# Patient Record
Sex: Female | Born: 1960 | Race: Black or African American | Hispanic: No | Smoking: Former smoker
Health system: Southern US, Community
[De-identification: ages and names within clinical notes are randomized; demographics above are authoritative.]

## PROBLEM LIST (undated history)

## (undated) DIAGNOSIS — J45909 Unspecified asthma, uncomplicated: Secondary | ICD-10-CM

## (undated) DIAGNOSIS — I10 Essential (primary) hypertension: Secondary | ICD-10-CM

## (undated) DIAGNOSIS — F32A Depression, unspecified: Secondary | ICD-10-CM

## (undated) DIAGNOSIS — J189 Pneumonia, unspecified organism: Secondary | ICD-10-CM

## (undated) DIAGNOSIS — J309 Allergic rhinitis, unspecified: Secondary | ICD-10-CM

## (undated) DIAGNOSIS — F329 Major depressive disorder, single episode, unspecified: Secondary | ICD-10-CM

## (undated) DIAGNOSIS — I82409 Acute embolism and thrombosis of unspecified deep veins of unspecified lower extremity: Secondary | ICD-10-CM

## (undated) DIAGNOSIS — R011 Cardiac murmur, unspecified: Secondary | ICD-10-CM

## (undated) DIAGNOSIS — T7840XA Allergy, unspecified, initial encounter: Secondary | ICD-10-CM

## (undated) DIAGNOSIS — K219 Gastro-esophageal reflux disease without esophagitis: Secondary | ICD-10-CM

## (undated) DIAGNOSIS — M1990 Unspecified osteoarthritis, unspecified site: Secondary | ICD-10-CM

## (undated) HISTORY — DX: Acute embolism and thrombosis of unspecified deep veins of unspecified lower extremity: I82.409

## (undated) HISTORY — DX: Allergic rhinitis, unspecified: J30.9

## (undated) HISTORY — DX: Unspecified asthma, uncomplicated: J45.909

## (undated) HISTORY — PX: JOINT REPLACEMENT: SHX530

## (undated) HISTORY — PX: TONSILLECTOMY: SUR1361

## (undated) HISTORY — DX: Allergy, unspecified, initial encounter: T78.40XA

---

## 2012-10-17 ENCOUNTER — Ambulatory Visit (INDEPENDENT_AMBULATORY_CARE_PROVIDER_SITE_OTHER): Payer: BC Managed Care – PPO | Admitting: Family Medicine

## 2012-10-17 ENCOUNTER — Encounter: Payer: Self-pay | Admitting: Family Medicine

## 2012-10-17 ENCOUNTER — Ambulatory Visit: Payer: Self-pay | Admitting: Family Medicine

## 2012-10-17 VITALS — BP 168/110 | Temp 98.5°F | Wt 194.2 lb

## 2012-10-17 DIAGNOSIS — I10 Essential (primary) hypertension: Secondary | ICD-10-CM

## 2012-10-17 DIAGNOSIS — Z0000 Encounter for general adult medical examination without abnormal findings: Secondary | ICD-10-CM

## 2012-10-17 DIAGNOSIS — J019 Acute sinusitis, unspecified: Secondary | ICD-10-CM

## 2012-10-17 MED ORDER — FLUCONAZOLE 150 MG PO TABS: 150.0000 mg | ORAL_TABLET | Freq: Once | ORAL | Status: DC

## 2012-10-17 MED ORDER — LEVOFLOXACIN 500 MG PO TABS: 500.0000 mg | ORAL_TABLET | Freq: Every day | ORAL | Status: AC

## 2012-10-17 MED ORDER — LISINOPRIL 5 MG PO TABS: 5.0000 mg | ORAL_TABLET | Freq: Every day | ORAL | Status: DC

## 2012-10-17 NOTE — Patient Instructions (Addendum)
Store brand Allegra 180 mg one daily Flonase 2 sprays daily   DASH Diet The DASH diet stands for "Dietary Approaches to Stop Hypertension." It is a healthy eating plan that has been shown to reduce high blood pressure (hypertension) in as little as 14 days, while also possibly providing other significant health benefits. These other health benefits include reducing the risk of breast cancer after menopause and reducing the risk of type 2 diabetes, heart disease, colon cancer, and stroke. Health benefits also include weight loss and slowing kidney failure in patients with chronic kidney disease.  DIET GUIDELINES  Limit salt (sodium). Your diet should contain less than 1500 mg of sodium daily.  Limit refined or processed carbohydrates. Your diet should include mostly whole grains. Desserts and added sugars should be used sparingly.  Include small amounts of heart-healthy fats. These types of fats include nuts, oils, and tub margarine. Limit saturated and trans fats. These fats have been shown to be harmful in the body. CHOOSING FOODS  The following food groups are based on a 2000 calorie diet. See your Registered Dietitian for individual calorie needs. Grains and Grain Products (6 to 8 servings daily)  Eat More Often: Whole-wheat bread, brown rice, whole-grain or wheat pasta, quinoa, popcorn without added fat or salt (air popped).  Eat Less Often: White bread, white pasta, white rice, cornbread. Vegetables (4 to 5 servings daily)  Eat More Often: Fresh, frozen, and canned vegetables. Vegetables may be raw, steamed, roasted, or grilled with a minimal amount of fat.  Eat Less Often/Avoid: Creamed or fried vegetables. Vegetables in a cheese sauce. Fruit (4 to 5 servings daily)  Eat More Often: All fresh, canned (in natural juice), or frozen fruits. Dried fruits without added sugar. One hundred percent fruit juice ( cup [237 mL] daily).  Eat Less Often: Dried fruits with added sugar. Canned  fruit in light or heavy syrup. [employer], Fish, and Poultry (2 servings or less daily. One serving is 3 to 4 oz [85-114 g]).  Eat More Often: Ninety percent or leaner ground beef, tenderloin, sirloin. Round cuts of beef, chicken breast, [place] breast. All fish. Grill, bake, or broil your meat. Nothing should be fried.  Eat Less Often/Avoid: Fatty cuts of meat, [place], or chicken leg, thigh, or wing. Fried cuts of meat or fish. Dairy (2 to 3 servings)  Eat More Often: Low-fat or fat-free milk, low-fat plain or light yogurt, reduced-fat or part-skim cheese.  Eat Less Often/Avoid: Milk (whole, 2%).Whole milk yogurt. Full-fat cheeses. Nuts, Seeds, and Legumes (4 to 5 servings per week)  Eat More Often: All without added salt.  Eat Less Often/Avoid: Salted nuts and seeds, canned beans with added salt. Fats and Sweets (limited)  Eat More Often: Vegetable oils, tub margarines without trans fats, sugar-free gelatin. Mayonnaise and salad dressings.  Eat Less Often/Avoid: Coconut oils, palm oils, butter, stick margarine, cream, half and half, cookies, candy, pie. FOR MORE INFORMATION The Dash Diet Eating Plan: www.dashdiet.org Document Released: 04/19/2011 Document Revised: 07/23/2011 Document Reviewed: 04/19/2011 [facility] Patient Information 2014 [place], [place].   Hypertension Hypertension is another name for high blood pressure. High blood pressure may mean that your heart needs to work harder to pump blood. Blood pressure consists of two numbers, which includes a higher number over a lower number (example: 110/72). HOME CARE   Make lifestyle changes as told by your doctor. This may include weight loss and exercise.  Take your blood pressure medicine every day.  Limit how much salt  you use.  Stop smoking if you smoke.  Do not use drugs.  Talk to your doctor if you are using decongestants or birth control pills. These medicines might make blood pressure higher.  Females should  not drink more than 1 alcoholic drink per day. Males should not drink more than 2 alcoholic drinks per day.  See your doctor as told. GET HELP RIGHT AWAY IF:   You have a blood pressure reading with a top number of 180 or higher.  You get a very bad headache.  You get blurred or changing vision.  You feel confused.  You feel weak, numb, or faint.  You get chest or belly (abdominal) pain.  You throw up (vomit).  You cannot breathe very well. MAKE SURE YOU:   Understand these instructions.  Will watch your condition.  Will get help right away if you are not doing well or get worse. Document Released: 10/17/2007 Document Revised: 07/23/2011 Document Reviewed: 10/17/2007 [facility] Patient Information 2014 [place], [place].

## 2012-10-17 NOTE — Progress Notes (Signed)
  Subjective:    Patient ID: Stephanie [name], female    DOB: 17-Apr-1961, 52 y.o.   MRN: [id number]  URI  This is a new problem. The current episode started in the past 7 days. The problem has been gradually worsening. Associated symptoms include coughing, ear pain and a sore throat. She has tried antihistamine for the symptoms. The treatment provided no relief.   Family history high blood pressure patient smokes a few cigarettes a day I encouraged her to quit   Review of Systems  HENT: Positive for ear pain and sore throat.   Respiratory: Positive for cough.        Objective:   Physical Exam Lungs are clear hearts regular blood pressure elevated on 2 readings mild sinus tenderness       Assessment & Plan:  #1 quit smoking #2 hypertension lisinopril 5 mg daily followup within 4-6 weeks to check blood pressure warning signs side effects discussed dietary measures and exercise discussed #3 acute sinusitis antibiotics as prescribed

## 2012-10-20 ENCOUNTER — Encounter: Payer: Self-pay | Admitting: *Deleted

## 2012-11-06 ENCOUNTER — Ambulatory Visit: Payer: BC Managed Care – PPO | Admitting: Family Medicine

## 2012-12-08 ENCOUNTER — Other Ambulatory Visit: Payer: Self-pay | Admitting: *Deleted

## 2012-12-08 MED ORDER — LISINOPRIL 5 MG PO TABS: 5.0000 mg | ORAL_TABLET | Freq: Every day | ORAL | Status: DC

## 2013-03-18 ENCOUNTER — Telehealth: Payer: Self-pay | Admitting: Nurse Practitioner

## 2013-03-18 NOTE — Telephone Encounter (Signed)
Pt having some yeast infection can we call in something to [facility], she prefers the cream

## 2013-03-19 ENCOUNTER — Other Ambulatory Visit: Payer: Self-pay | Admitting: *Deleted

## 2013-03-19 ENCOUNTER — Other Ambulatory Visit: Payer: Self-pay | Admitting: Nurse Practitioner

## 2013-03-19 DIAGNOSIS — I83893 Varicose veins of bilateral lower extremities with other complications: Secondary | ICD-10-CM

## 2013-03-19 MED ORDER — TERCONAZOLE 0.4 % VA CREA: 1.0000 | TOPICAL_CREAM | Freq: Every day | VAGINAL | Status: DC

## 2013-04-02 ENCOUNTER — Encounter: Payer: Self-pay | Admitting: Vascular Surgery

## 2013-04-02 ENCOUNTER — Encounter (HOSPITAL_COMMUNITY): Payer: Self-pay

## 2013-05-06 ENCOUNTER — Encounter: Payer: Self-pay | Admitting: Nurse Practitioner

## 2013-05-06 ENCOUNTER — Ambulatory Visit (INDEPENDENT_AMBULATORY_CARE_PROVIDER_SITE_OTHER): Payer: BC Managed Care – PPO | Admitting: Nurse Practitioner

## 2013-05-06 VITALS — BP 134/98 | Temp 99.8°F | Ht 64.0 in | Wt 191.8 lb

## 2013-05-06 DIAGNOSIS — J111 Influenza due to unidentified influenza virus with other respiratory manifestations: Secondary | ICD-10-CM

## 2013-05-06 MED ORDER — OSELTAMIVIR PHOSPHATE 75 MG PO CAPS: 75.0000 mg | ORAL_CAPSULE | Freq: Two times a day (BID) | ORAL | Status: DC

## 2013-05-06 NOTE — Patient Instructions (Signed)

## 2013-05-13 ENCOUNTER — Encounter: Payer: Self-pay | Admitting: Nurse Practitioner

## 2013-05-13 NOTE — Progress Notes (Signed)
Subjective:  Presents complaints of chills fatigue body aches sore throat and cough that began yesterday. No vomiting or diarrhea. Minimal headache. Taking fluids well. Voiding normal limit.  Objective:   BP 134/98  Temp(Src) 99.8 F (37.7 C)  Ht 5\' 4"  (1.626 m)  Wt 191 lb 12.8 oz (87 kg)  BMI 32.91 kg/m2 NAD. Alert, oriented. Fatigued in appearance. TMs normal limit. Pharynx clear. Neck supple with minimal adenopathy. Lungs clear. Heart regular rate rhythm.  Assessment:Influenza  Plan: Meds ordered this encounter  Medications  . triamterene-hydrochlorothiazide (DYAZIDE) 37.5-25 MG per capsule    Sig:   . oseltamivir (TAMIFLU) 75 MG capsule    Sig: Take 1 capsule (75 mg total) by mouth 2 (two) times daily.    Dispense:  10 capsule    Refill:  0    Order Specific Question:  Supervising Provider    Answer:  Merlyn Albert [[id number]]   Influenza-the patient was diagnosed with influenza. Patient/family educated about the flu and warning signs to watch for. If difficulty breathing, severe neck pain and stiffness, cyanosis, disorientation, or progressive worsening then immediately get rechecked at that ER. If progressive symptoms be certain to be rechecked. Supportive measures such as Tylenol/ibuprofen was discussed. No aspirin use in children. And influenza home care instruction sheet was given. Tamiflu prescribed.

## 2013-09-16 ENCOUNTER — Ambulatory Visit: Payer: BC Managed Care – PPO | Admitting: Family Medicine

## 2014-09-24 ENCOUNTER — Ambulatory Visit (INDEPENDENT_AMBULATORY_CARE_PROVIDER_SITE_OTHER): Payer: BLUE CROSS/BLUE SHIELD | Admitting: Nurse Practitioner

## 2014-09-24 ENCOUNTER — Encounter: Payer: Self-pay | Admitting: Nurse Practitioner

## 2014-09-24 VITALS — BP 134/88 | Ht 64.0 in | Wt 200.6 lb

## 2014-09-24 DIAGNOSIS — M76891 Other specified enthesopathies of right lower limb, excluding foot: Secondary | ICD-10-CM

## 2014-09-24 DIAGNOSIS — M5441 Lumbago with sciatica, right side: Secondary | ICD-10-CM | POA: Diagnosis not present

## 2014-09-24 DIAGNOSIS — M65851 Other synovitis and tenosynovitis, right thigh: Secondary | ICD-10-CM

## 2014-09-24 MED ORDER — PHENTERMINE HCL 37.5 MG PO TABS: 37.5000 mg | ORAL_TABLET | Freq: Every day | ORAL | Status: DC

## 2014-09-24 MED ORDER — CHLORZOXAZONE 500 MG PO TABS: 500.0000 mg | ORAL_TABLET | Freq: Three times a day (TID) | ORAL | Status: DC | PRN

## 2014-09-24 NOTE — Patient Instructions (Signed)
[employer] continuing education Education administratorCertified nursing assistant CNA Level I

## 2014-09-27 ENCOUNTER — Encounter: Payer: Self-pay | Admitting: Nurse Practitioner

## 2014-09-27 NOTE — Progress Notes (Signed)
Subjective:  Presents complaints of right area hip pain for the past several months. No specific history of injury. Occurs more when she is on her feet or when she works. Her job includes driving a Chief Executive Officerforklift. Pain in the hip area with some pain and numbness going into the right lateral thigh at times. Does not go below the knee. Also some pain in the hip joint. No abdominal pain. No change in bowel or bladder habits.  Objective:   BP 134/88 mmHg  Ht 5\' 4"  (1.626 m)  Wt 200 lb 9.6 oz (90.992 kg)  BMI 34.42 kg/m2  LMP 07/30/2014 NAD. Alert, oriented. Mild tenderness in the right lumbar area near the sciatic notch. Right foot good pulses. SLR negative bilaterally. Very limited ROM of the right and left hips, very tight.  Assessment:  Problem List Items Addressed This Visit      Other   Morbid obesity   Relevant Medications   phentermine (ADIPEX-P) 37.5 MG tablet    Other Visit Diagnoses    Right-sided low back pain with right-sided sciatica    -  Primary    Relevant Medications    chlorzoxazone (PARAFON) 500 MG tablet    Other Relevant Orders    Ambulatory referral to Physical Therapy    Hip tendinitis, right        Relevant Orders    Ambulatory referral to Physical Therapy       Plan:  Meds ordered this encounter  Medications  . chlorzoxazone (PARAFON) 500 MG tablet    Sig: Take 1 tablet (500 mg total) by mouth 3 (three) times daily as needed for muscle spasms.    Dispense:  30 tablet    Refill:  0    Order Specific Question:  Supervising Provider    Answer:  Merlyn AlbertLUKING, WILLIAM S [[id number]]  . phentermine (ADIPEX-P) 37.5 MG tablet    Sig: Take 1 tablet (37.5 mg total) by mouth daily before breakfast.    Dispense:  30 tablet    Refill:  0    Order Specific Question:  Supervising Provider    Answer:  Merlyn AlbertLUKING, WILLIAM S [[id number]]  . lisinopril (PRINIVIL,ZESTRIL) 5 MG tablet    Sig:     Refill:  3   Unable to take anti-inflammatories due to allergies. Tylenol as directed for pain.  Physical therapy ordered. Recommend healthy diet and regular activity. Trial of phentermine. Reviewed potential adverse effects. DC med and call if any problems. Office visit if she wishes to continue.

## 2014-10-12 ENCOUNTER — Encounter (HOSPITAL_COMMUNITY): Payer: Self-pay | Admitting: Physical Therapy

## 2014-10-14 ENCOUNTER — Ambulatory Visit (HOSPITAL_COMMUNITY): Payer: BLUE CROSS/BLUE SHIELD | Attending: Family Medicine | Admitting: Physical Therapy

## 2014-10-14 DIAGNOSIS — R29898 Other symptoms and signs involving the musculoskeletal system: Secondary | ICD-10-CM | POA: Diagnosis not present

## 2014-10-14 DIAGNOSIS — M25651 Stiffness of right hip, not elsewhere classified: Secondary | ICD-10-CM | POA: Diagnosis not present

## 2014-10-14 DIAGNOSIS — M25652 Stiffness of left hip, not elsewhere classified: Secondary | ICD-10-CM | POA: Diagnosis not present

## 2014-10-14 DIAGNOSIS — R262 Difficulty in walking, not elsewhere classified: Secondary | ICD-10-CM

## 2014-10-14 DIAGNOSIS — M6588 Other synovitis and tenosynovitis, other site: Secondary | ICD-10-CM | POA: Insufficient documentation

## 2014-10-14 DIAGNOSIS — M2170 Unequal limb length (acquired), unspecified site: Secondary | ICD-10-CM | POA: Diagnosis not present

## 2014-10-14 DIAGNOSIS — M6289 Other specified disorders of muscle: Secondary | ICD-10-CM | POA: Diagnosis not present

## 2014-10-14 DIAGNOSIS — M545 Low back pain: Secondary | ICD-10-CM | POA: Diagnosis present

## 2014-10-14 DIAGNOSIS — M76891 Other specified enthesopathies of right lower limb, excluding foot: Secondary | ICD-10-CM

## 2014-10-14 DIAGNOSIS — M6281 Muscle weakness (generalized): Secondary | ICD-10-CM

## 2014-10-14 NOTE — Patient Instructions (Signed)
  BRIDGING  While lying on your back, tighten your lower abdominals, squeeze your buttocks and then raise your buttocks off the floor/bed as creating a "Bridge" with your body. Do this exercise with your right leg closer to your rear. Do 10x, twice a day.    STRAIGHT LEG RAISE - SLR  While lying or sitting, raise up your leg with a straight knee.  Keep the opposite knee bent with the foot planted to the ground. ONLY DO THIS WITH YOUR LEFT LEG!!!!! Do 10x, twice a day.   Functional Quadriceps: Sit to Stand   Sit on edge of chair, feet flat on floor with your right foot tucked back and your left foot more forward. Stand upright, extending knees fully. Repeat __10__ times per set. Do _1___ sets per session. Do __2__ sessions per day.  http://orth.exer.us/735   Copyright  VHI. All rights reserved.

## 2014-10-14 NOTE — Therapy (Signed)
[place], [place], [address] Phone: [phone]   Fax:  [phone]  Physical Therapy Evaluation  Patient Details  Name: Stephanie [name] MRN: [id number] Date of Birth: Jun 25, 1960 Referring Provider:  Kathyrn Drown, MD  Encounter Date: 10/14/2014      PT End of Session - 10/14/14 1707    Visit Number 1   Number of Visits 6   Date for PT Re-Evaluation 11/11/14   Authorization Type [employer]    Authorization Time Period 10/14/14 to 12/14/14   PT Start Time 1540   PT Stop Time 1622   PT Time Calculation (min) 42 min   Activity Tolerance Patient tolerated treatment well   Behavior During Therapy [facility] for tasks assessed/performed      No past medical history on file.  No past surgical history on file.  There were no vitals filed for this visit.  Visit Diagnosis:  Right low back pain, with sciatica presence unspecified - Plan: PT plan of care cert/re-cert  Right hip tendinitis - Plan: PT plan of care cert/re-cert  Proximal muscle weakness - Plan: PT plan of care cert/re-cert  Weakness of both lower extremities - Plan: PT plan of care cert/re-cert  Hip stiffness, left - Plan: PT plan of care cert/re-cert  Hip stiffness, right - Plan: PT plan of care cert/re-cert  Leg length discrepancy - Plan: PT plan of care cert/re-cert  Difficulty walking - Plan: PT plan of care cert/re-cert      Subjective Assessment - 10/14/14 1541    Subjective Pain fluctuates a bit, but still has pain even on good days. Can't perform average tasks through the day, really hurts more as the day goes on.    Pertinent History Patient just noticed back pain when she woke up one day when it came out of nowhere, just continued to increase through day with pain going to hip    How long can you sit comfortably? 5-10 minutes    How long can you stand comfortably? 10 to 60mnutes    How long can you walk comfortably? Not even 2078f   Patient  Stated Goals Loosen up muscles and get rid of pain    Currently in Pain? Yes   Pain Score 5    Pain Location Hip   Pain Orientation Right   Pain Descriptors / Indicators Sore   Pain Radiating Towards shoots down R leg             [facility] PT Assessment - 10/14/14 0001    Assessment   Medical Diagnosis R LBP with R hip tendinitis    Onset Date/Surgical Date 08/11/14  approximate    Next MD Visit July 2016   Precautions   Precautions None   Restrictions   Weight Bearing Restrictions No   Balance Screen   Has the patient fallen in the past 6 months No   Has the patient had a decrease in activity level because of a fear of falling?  No   Is the patient reluctant to leave their home because of a fear of falling?  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, driving fork lift    Leisure fishing, dancing   Observation/Other Assessments   Observations LLD with Rt hip anteriorly rotated   Posture/Postural Control   Posture Comments increased curvature cervical and lumbar  spines, apparent LLD, valgus knees, possible pronation in stance   AROM   Right Hip External Rotation  20   Right Hip Internal Rotation  18   Left Hip External Rotation  24   Left Hip Internal Rotation  19   Lumbar Flexion 96  immiediate pain    Lumbar Extension 30  immediate pain    Lumbar - Right Side Bend 22  immedaite pain    Lumbar - Left Side Bend 24   Lumbar - Right Rotation --  less than 50% of normal range    Lumbar - Left Rotation --  less than 50% of normal range    Strength   Overall Strength Comments Lower abs approx 3-/5, upper abs approx 2+/5   Right Hip Flexion 4/5   Right Hip Extension 3+/5   Right Hip ABduction 2+/5   Left Hip Flexion 3+/5   Left Hip Extension 3+/5   Left Hip ABduction 2+/5   Right Knee Flexion 4-/5   Right Knee  Extension 4/5   Left Knee Flexion 4/5   Left Knee Extension 4/5   Right Ankle Dorsiflexion 4/5   Left Ankle Dorsiflexion 4/5   Ambulation/Gait   Gait Comments proximal muscle weakness, flexed at hips, possible pronation, reduced rotation of trunk and pelvis                    [facility] Adult PT Treatment/Exercise - 10/14/14 0001    Manual Therapy   Manual Therapy Other (comment)   Other Manual Therapy Correction of leg length discrepancy with MET                 PT Education - 10/14/14 1706    Education provided Yes   Education Details prognosis with skilled PT services, plan of care moving forward, HEP    Person(s) Educated Patient   Methods Explanation   Comprehension Verbalized understanding          PT Short Term Goals - 10/14/14 1713    PT SHORT TERM GOAL #1   Title Patient will experience no more than 3/10 pain in her back and R hip on a consistant basis during all sitting, standing, and gait based activities    Time 3   Period Weeks   Status New   PT SHORT TERM GOAL #2   Title Patient will demonstrate at least 30 degrees bilateral hip ER and 25 degrees bilateral hip IR    Time 3   Period Weeks   Status New   PT SHORT TERM GOAL #3   Title Patient will demonstrate bilateral LE strength of at least 4+/5, proximal muscle strength of at least 4-/5, and core strength of at least 4-/5    Time 3   Period Weeks   Status New   PT SHORT TERM GOAL #4   Title Patient will be able to verbalize the importance of maintaining good posture and will be able to maintain good posture throughout all functional tasks and activities at home and in the workplace    Time 3   Period Weeks   Status New   PT SHORT TERM GOAL #5   Title Patient will demonstrate an improvement in lumbar lateral flexion by at least 8 degrees and increase in  lumbar rotation by at least 25% of gained range    Time 3   Period Weeks   Status New           PT Long Term Goals -  10/14/14  1715    PT LONG TERM GOAL #1   Title Patient to be independent in correctly and consistently performing appropriate HEP, to be updated PRN    Time 6   Period Weeks   Status New   PT LONG TERM GOAL #2   Title Patient will be able to consistently be able to bend over to pick an item up from the floor with no more than occasional 1/10 pain in her low back and R hip    Time 6   Period Weeks   Status New   PT LONG TERM GOAL #3   Title Patient will demonstrate consistent correction of lower extremity muscle imbalance, as evidenced by not needing skilled services to correct hip alignment for at least 2 PT visits in a row    Time 6   Period Weeks   Status New   PT LONG TERM GOAL #4   Title Patient to report that she has been able to work through at least 8 hours of her 12 hour work shift with pain no more than 1/10 in her low back and R hip    Time 6   Period Weeks   Status New   PT LONG TERM GOAL #5   Title Patient will demonstrate at least 40 degrees bilateral hip ER and at least 35 degrees bilateral hip  IR    Time 6   Period [place] - 10/14/14 1708    Clinical Impression Statement Patient presents with low back and R hip pain, reduced lumbar lateral flexion and rotation, reduced bilateral hip IR/ER mobility, bilateral LE and proximal muscle/core weakness, leg length discrepancy, postural and gait deviations, and reduced functional task performance skills. Corrected leg length discrepancy today using MET with good success.The patient states that her pain fluctuates regularly and that sometimes she cannot even bend over to pick something up the pain is so bad. She also requests that , due to her company closing soon and her having an inability to be away from her 12 hour work shift multiple times per week, that she be seen 1x/week for a longer period of time. The patient will likely benefit from skilled PT services in order to address these impairments and  assist her in reaching an optimal level of function.    Pt will benefit from skilled therapeutic intervention in order to improve on the following deficits Abnormal gait;Hypomobility;Decreased activity tolerance;Decreased strength;Pain;Decreased balance;Decreased mobility;Difficulty walking;Improper body mechanics;Decreased coordination;Decreased safety awareness;Postural dysfunction   Rehab Potential Good   PT Frequency 1x / week   PT Duration 6 weeks   PT Treatment/Interventions ADLs/Self Care Home Management;Gait training;Stair training;Functional mobility training;Therapeutic activities;Therapeutic exercise;Balance training;Neuromuscular re-education;Patient/family education;Manual techniques   PT Next Visit Plan review HEP and goals; correct LLD with MET if needed; functional strengthening and stretching; hip mobility for ER/IR    PT Home Exercise Plan given    Consulted and Agree with Plan of Care Patient         Problem List Patient Active Problem List   Diagnosis Date Noted  . Morbid obesity 09/27/2014  . Essential hypertension, benign 10/17/2012    Deniece Ree PT, DPT [phone]  [place] [address] [place], [place], [address] Phone: [phone]   Fax:  [phone]

## 2014-10-28 ENCOUNTER — Ambulatory Visit (HOSPITAL_COMMUNITY): Payer: BLUE CROSS/BLUE SHIELD

## 2014-10-28 DIAGNOSIS — M2170 Unequal limb length (acquired), unspecified site: Secondary | ICD-10-CM

## 2014-10-28 DIAGNOSIS — R29898 Other symptoms and signs involving the musculoskeletal system: Secondary | ICD-10-CM

## 2014-10-28 DIAGNOSIS — M25652 Stiffness of left hip, not elsewhere classified: Secondary | ICD-10-CM

## 2014-10-28 DIAGNOSIS — M545 Low back pain: Secondary | ICD-10-CM

## 2014-10-28 DIAGNOSIS — M25651 Stiffness of right hip, not elsewhere classified: Secondary | ICD-10-CM

## 2014-10-28 DIAGNOSIS — M76891 Other specified enthesopathies of right lower limb, excluding foot: Secondary | ICD-10-CM

## 2014-10-28 DIAGNOSIS — R262 Difficulty in walking, not elsewhere classified: Secondary | ICD-10-CM

## 2014-10-28 DIAGNOSIS — M6281 Muscle weakness (generalized): Secondary | ICD-10-CM

## 2014-10-28 NOTE — Patient Instructions (Addendum)
Lower Trunk Rotation Stretch   Keeping back flat and feet together, rotate knees to left side. Hold 10 seconds. Repeat 10-20 times per set. Do 2-3 sets per session. Do 1-2 sessions per day.  http://orth.exer.us/123   Copyright  VHI. All rights reserved.   Piriformis Stretch   Lying on back, pull right knee toward opposite shoulder. Hold 30 seconds. Repeat 3 times. Do 1-2 sessions per day.  http://gt2.exer.us/258   Copyright  VHI. All rights reserved.   3D Hip Excursions

## 2014-10-28 NOTE — Therapy (Addendum)
[place], [place], [address] Phone: [phone]   Fax:  [phone]  Physical Therapy Treatment  Patient Details  Name: Stephanie [name] MRN: [id number] Date of Birth: January 03, 1961 Referring Provider:  Kathyrn Drown, MD  Encounter Date: 10/28/2014      PT End of Session - 10/28/14 1222    Visit Number 2   Number of Visits 6   Date for PT Re-Evaluation 11/11/14   Authorization Type BCBS    Authorization Time Period 10/14/14 to 12/14/14   PT Start Time 1153   PT Stop Time 1232   PT Time Calculation (min) 39 min   Activity Tolerance Patient tolerated treatment well   Behavior During Therapy [facility] for tasks assessed/performed      No past medical history on file.  No past surgical history on file.  There were no vitals filed for this visit.  Visit Diagnosis:  Right low back pain, with sciatica presence unspecified  Right hip tendinitis  Proximal muscle weakness  Weakness of both lower extremities  Hip stiffness, left  Hip stiffness, right  Leg length discrepancy  Difficulty walking      Subjective Assessment - 10/28/14 1211    Subjective Pt stated she has been compliance with HEP except when on vacation and decreased frequency upon return.  Rt hip pain scale 4-5/10 tightness   Currently in Pain? Yes   Pain Score 5    Pain Location Hip   Pain Orientation Right   Pain Descriptors / Indicators Tightness              [facility] Adult PT Treatment/Exercise - 10/28/14 0001    Exercises   Exercises Lumbar   Lumbar Exercises: Stretches   Lower Trunk Rotation 10 seconds   Lower Trunk Rotation Limitations 10x 10   Pelvic Tilt Limitations 10 x sitting and standing   Piriformis Stretch 2 reps;60 seconds;3 reps;30 seconds   Piriformis Stretch Limitations manual supine; supine with towel   Lumbar Exercises: Standing   Other Standing Lumbar Exercises 3D hip excursion   Lumbar Exercises: Supine   Bridge 10 reps   Other Supine Lumbar Exercises Manual IR/ER 2x 10   Other Supine Lumbar Exercises winshield wipes Bil LE 20x for IR/ER   Manual Therapy   Manual Therapy Muscle Energy Technique   Other Manual Therapy Assessment complete for leg length discrepancy, no MET required.             PT Short Term Goals - 10/28/14 1225    PT SHORT TERM GOAL #1   Title Patient will experience no more than 3/10 pain in her back and R hip on a consistant basis during all sitting, standing, and gait based activities    Status On-going   PT SHORT TERM GOAL #2   Title Patient will demonstrate at least 30 degrees bilateral hip ER and 25 degrees bilateral hip IR    Status On-going   PT SHORT TERM GOAL #3   Title Patient will demonstrate bilateral LE strength of at least 4+/5, proximal muscle strength of at least 4-/5, and core strength of at least 4-/5    Status On-going   PT SHORT TERM GOAL #4   Title Patient will be able to verbalize the importance of maintaining good posture and will be able to maintain good posture throughout all functional tasks and activities at home and in the workplace    Status On-going   PT SHORT TERM GOAL #5  Title Patient will demonstrate an improvement in lumbar lateral flexion by at least 8 degrees and increase in  lumbar rotation by at least 25% of gained range            PT Long Term Goals - 10/28/14 1226    PT LONG TERM GOAL #1   Title Patient to be independent in correctly and consistently performing appropriate HEP, to be updated PRN    PT LONG TERM GOAL #2   Title Patient will be able to consistently be able to bend over to pick an item up from the floor with no more than occasional 1/10 pain in her low back and R hip    PT LONG TERM GOAL #3   Title Patient will demonstrate consistent correction of lower extremity muscle imbalance, as evidenced by not needing skilled services to correct hip alignment for at least 2 PT visits in a row    PT LONG TERM GOAL  #4   Title Patient to report that she has been able to work through at least 8 hours of her 12 hour work shift with pain no more than 1/10 in her low back and R hip    PT LONG TERM GOAL #5   Title Patient will demonstrate at least 40 degrees bilateral hip ER and at least 35 degrees bilateral hip  IR                Plan - 10/28/14 1655    Clinical Impression Statement Reviewed goals, HEP complaince and copy of evaluation given and questions answered.  Pt able to demonstrate appropriate technique wtih HEP exercises with no cueing required.  Si alignment and leg length assessed with no muscle energy technique required this session.  Noted vast restrictions with hip mobility especially Bil IR and ER.  Session focus on improving hip mobilty with stretches, exercises and manual techniques.  Pt able to demonstrate appropriate techniques with all exercises following cueing.  Noted improvements with gait mechanics at end of session.  Pt reported pain reduced to 2/10 at end of session.  New HEP given to pt. to improve hip mobiltiy.     PT Next Visit Plan F/U with new HEP complaince and answer questions as needed; correct LLD with MET if needed; functional strengthening and stretching; hip mobility for ER/IR         Problem List Patient Active Problem List   Diagnosis Date Noted  . Morbid obesity 09/27/2014  . Essential hypertension, benign 10/17/2012   Juel Burrow, PTA  Juel Burrow 10/28/2014, 5:04 PM   PHYSICAL THERAPY DISCHARGE SUMMARY Visits from Start of Care: 2  Current functional level related to goals / functional outcomes: Patient has not returned since second treatment on 10/28/14; patient did state over the phone today that she had not been coming since her work schedule has changed to 12 hour shifts, but that she may return with new MD order when her schedule changes again in August.    Remaining deficits: Pain, difficulty walking, hip stiffness   Education /  Equipment: DC at this time, need for new MD order to resume skilled PT services   Plan: Patient agrees to discharge.  Patient goals were not met. Patient is being discharged due to not returning since the last visit.  ?????       Nedra Hai PT, DPT [phone]   [facility] [address] [place], [place], [address] Phone: [phone]  Fax:  [phone]

## 2014-11-04 ENCOUNTER — Ambulatory Visit (HOSPITAL_COMMUNITY): Payer: BLUE CROSS/BLUE SHIELD | Admitting: Physical Therapy

## 2014-11-10 ENCOUNTER — Ambulatory Visit (HOSPITAL_COMMUNITY): Payer: BLUE CROSS/BLUE SHIELD | Admitting: Physical Therapy

## 2014-11-16 ENCOUNTER — Encounter (HOSPITAL_COMMUNITY): Payer: Self-pay

## 2014-11-19 ENCOUNTER — Telehealth (HOSPITAL_COMMUNITY): Payer: Self-pay | Admitting: Physical Therapy

## 2014-11-19 ENCOUNTER — Ambulatory Visit (HOSPITAL_COMMUNITY): Payer: BLUE CROSS/BLUE SHIELD | Attending: Family Medicine | Admitting: Physical Therapy

## 2014-11-19 DIAGNOSIS — M25652 Stiffness of left hip, not elsewhere classified: Secondary | ICD-10-CM | POA: Insufficient documentation

## 2014-11-19 DIAGNOSIS — M545 Low back pain: Secondary | ICD-10-CM | POA: Insufficient documentation

## 2014-11-19 DIAGNOSIS — M6588 Other synovitis and tenosynovitis, other site: Secondary | ICD-10-CM | POA: Insufficient documentation

## 2014-11-19 DIAGNOSIS — R262 Difficulty in walking, not elsewhere classified: Secondary | ICD-10-CM | POA: Insufficient documentation

## 2014-11-19 DIAGNOSIS — R29898 Other symptoms and signs involving the musculoskeletal system: Secondary | ICD-10-CM | POA: Insufficient documentation

## 2014-11-19 DIAGNOSIS — M25651 Stiffness of right hip, not elsewhere classified: Secondary | ICD-10-CM | POA: Insufficient documentation

## 2014-11-19 DIAGNOSIS — M2170 Unequal limb length (acquired), unspecified site: Secondary | ICD-10-CM | POA: Insufficient documentation

## 2014-11-19 DIAGNOSIS — M6289 Other specified disorders of muscle: Secondary | ICD-10-CM | POA: Insufficient documentation

## 2014-11-19 NOTE — Therapy (Deleted)
[facility] [address] [place], [place], [address] Phone: [phone]   Fax:  [phone]  Patient Details  Name: Stephanie [name] MRN: [id number] Date of Birth: 09/08/1960 Referring Provider:  Luking, Scott A, MD  Encounter Date: 11/19/2014  PHYSICAL THERAPY DISCHARGE SUMMARY  Visits from Start of Care: 2  Current functional level related to goals / functional outcomes: Patient has not returned since second treatment on 10/28/14; patient did state over the phone today that she had not been coming since her work schedule has changed to 12 hour shifts, but that she may return with new MD order when her schedule changes again in August.    Remaining deficits: Pain, difficulty walking, hip stiffness   Education / Equipment: DC at this time, need for new MD order to resume skilled PT services  Plan: Patient agrees to discharge.  Patient goals were not met. Patient is being discharged due to not returning since the last visit.  ?????       Kristen Unger PT, DPT [phone]  [facility] [address] [place], [place], [address] Phone: [phone]   Fax:  [phone] 

## 2014-11-19 NOTE — Telephone Encounter (Signed)
Called patient's mobile and informed her that she would be discharged per clinic policy after having 2 no shows in a row. Patient agreeable and stated that she had called and told someone that she wanted to be discharged due to work schedule. Agreeable to discharge at this time but does state that when her schedule changes in August she may come back with new MD order.   Nedra HaiKristen Jaskiran Pata PT, DPT [phone]

## 2014-11-23 ENCOUNTER — Encounter (HOSPITAL_COMMUNITY): Payer: Self-pay

## 2014-11-30 ENCOUNTER — Encounter (HOSPITAL_COMMUNITY): Payer: Self-pay

## 2014-12-07 ENCOUNTER — Encounter (HOSPITAL_COMMUNITY): Payer: Self-pay

## 2014-12-20 ENCOUNTER — Other Ambulatory Visit: Payer: Self-pay | Admitting: *Deleted

## 2014-12-20 DIAGNOSIS — I83812 Varicose veins of left lower extremities with pain: Secondary | ICD-10-CM

## 2014-12-21 ENCOUNTER — Encounter: Payer: Self-pay | Admitting: Nurse Practitioner

## 2014-12-21 ENCOUNTER — Ambulatory Visit (INDEPENDENT_AMBULATORY_CARE_PROVIDER_SITE_OTHER): Payer: BLUE CROSS/BLUE SHIELD | Admitting: Nurse Practitioner

## 2014-12-21 VITALS — BP 124/84 | Ht 64.0 in | Wt 198.2 lb

## 2014-12-21 DIAGNOSIS — M76891 Other specified enthesopathies of right lower limb, excluding foot: Secondary | ICD-10-CM

## 2014-12-21 DIAGNOSIS — M5441 Lumbago with sciatica, right side: Secondary | ICD-10-CM

## 2014-12-21 DIAGNOSIS — M65851 Other synovitis and tenosynovitis, right thigh: Secondary | ICD-10-CM | POA: Diagnosis not present

## 2014-12-21 MED ORDER — PHENTERMINE HCL 37.5 MG PO TABS: 37.5000 mg | ORAL_TABLET | Freq: Every day | ORAL | Status: DC

## 2014-12-21 NOTE — Progress Notes (Signed)
Subjective:  Presents for recheck on her low back pain and hip pain. Low back pain has improved some. Still mainly on the right side. Continues to have very tight hip joints. Started PT but had to stop due to her work. Would like to try this again. States it greatly helped her symptoms. Also patient wishes to continue phentermine, has lost 6 pounds at one point. Has been off for about 2 weeks. Also recently lost her brother which impacted her exercise and diet. Denies any side effects from phentermine.  Objective:   BP 124/84 mmHg  Ht  (1.626 m)  Wt 198 lb 4 oz (89.926 kg)  BMI 34.01 kg/m2 NAD. Alert, oriented. Lungs clear. Heart regular rate rhythm. SLR negative bilaterally. Extremely tight hip joints bilateral, limited lateral rotation or external/internal rotation.  Assessment:  Problem List Items Addressed This Visit      Other   Morbid obesity   Relevant Medications   phentermine (ADIPEX-P) 37.5 MG tablet    Other Visit Diagnoses    Right-sided low back pain with right-sided sciatica    -  Primary    Relevant Orders    Ambulatory referral to Physical Therapy    Hip tendinitis, right        Relevant Orders    Ambulatory referral to Physical Therapy      Plan:  Meds ordered this encounter  Medications  . phentermine (ADIPEX-P) 37.5 MG tablet    Sig: Take 1 tablet (37.5 mg total) by mouth daily before breakfast.    Dispense:  30 tablet    Refill:  2    Order Specific Question:  Supervising Provider    Answer:  Merlyn Albert [[id number]]   Refer back to PT. Encourage regular activity and healthy diet. Continue phentermine for now. Return if symptoms worsen or fail to improve. Follow-up in 3 months if she wishes to continue weight loss medication.

## 2014-12-23 ENCOUNTER — Encounter (HOSPITAL_COMMUNITY): Payer: Self-pay

## 2014-12-23 ENCOUNTER — Encounter: Payer: Self-pay | Admitting: Vascular Surgery

## 2015-01-03 ENCOUNTER — Encounter: Payer: Self-pay | Admitting: Family Medicine

## 2015-01-03 ENCOUNTER — Ambulatory Visit (INDEPENDENT_AMBULATORY_CARE_PROVIDER_SITE_OTHER): Payer: BLUE CROSS/BLUE SHIELD | Admitting: Family Medicine

## 2015-01-03 ENCOUNTER — Ambulatory Visit (HOSPITAL_COMMUNITY)
Admission: RE | Admit: 2015-01-03 | Discharge: 2015-01-03 | Disposition: A | Discharge status: Home / Self Care | Payer: BLUE CROSS/BLUE SHIELD | Source: Ambulatory Visit | Attending: Family Medicine | Admitting: Family Medicine

## 2015-01-03 VITALS — BP 106/72 | Temp 98.7°F | Ht 64.0 in | Wt 196.0 lb

## 2015-01-03 DIAGNOSIS — M545 Low back pain: Secondary | ICD-10-CM | POA: Diagnosis present

## 2015-01-03 DIAGNOSIS — M47896 Other spondylosis, lumbar region: Secondary | ICD-10-CM | POA: Insufficient documentation

## 2015-01-03 DIAGNOSIS — M5441 Lumbago with sciatica, right side: Secondary | ICD-10-CM

## 2015-01-03 DIAGNOSIS — Q7649 Other congenital malformations of spine, not associated with scoliosis: Secondary | ICD-10-CM | POA: Diagnosis not present

## 2015-01-03 MED ORDER — HYDROCODONE-ACETAMINOPHEN 10-325 MG PO TABS: 1.0000 | ORAL_TABLET | ORAL | Status: DC | PRN

## 2015-01-03 MED ORDER — GABAPENTIN 100 MG PO CAPS: 100.0000 mg | ORAL_CAPSULE | Freq: Three times a day (TID) | ORAL | Status: DC

## 2015-01-03 NOTE — Progress Notes (Signed)
   Subjective:    Patient ID: Stephanie [name], female    DOB: Jun 17, 1960, 54 y.o.   MRN: [id number]  Hip Pain  The incident occurred more than 1 week ago. There was no injury mechanism. The pain is present in the right hip, right knee and right thigh. The quality of the pain is described as aching. The pain is at a severity of 10/10. The pain is moderate. The pain has been intermittent since onset. Associated symptoms include numbness. She reports no foreign bodies present. The symptoms are aggravated by movement. Treatments tried: Pennsaid solution  The treatment provided moderate relief.    She denies loss of bowel or bladder control. The pain is so severe it is preventing her from working.   Review of Systems  Neurological: Positive for numbness.   Numbness and pain down the right leg    Objective:   Physical Exam  Subjective discomfort in the lower back positive straight leg raise on the right increased reflexes of the patella on the right strength fairly good but hard to test because of pain left leg normal lungs clear heart regular      Assessment & Plan:  Significant lumbar pain this is been present for more than 6 months with radiation down the right leg beyond the right knee this is getting to the point that she has a difficult time getting out of bed walking around and maneuvering. She also relates at times her right leg feels weak we will do lumbar spine x-rays more than likely will need MRI as well. Pain medication when necessary at home caution drowsiness muscle relaxers only when necessary at home. Start gabapentin gradually titrate up.

## 2015-01-03 NOTE — Patient Instructions (Signed)
Gabapentin start with 1 per evening for 3 days then 1 twice a day for 1 week then one 3 times a day Recheck here in 3 weeks Do your xray      Sciatica Sciatica is pain, weakness, numbness, or tingling along the path of the sciatic nerve. The nerve starts in the lower back and runs down the back of each leg. The nerve controls the muscles in the lower leg and in the back of the knee, while also providing sensation to the back of the thigh, lower leg, and the sole of your foot. Sciatica is a symptom of another medical condition. For instance, nerve damage or certain conditions, such as a herniated disk or bone spur on the spine, pinch or put pressure on the sciatic nerve. This causes the pain, weakness, or other sensations normally associated with sciatica. Generally, sciatica only affects one side of the body. CAUSES   Herniated or slipped disc.  Degenerative disk disease.  A pain disorder involving the narrow muscle in the buttocks (piriformis syndrome).  Pelvic injury or fracture.  Pregnancy.  Tumor (rare). SYMPTOMS  Symptoms can vary from mild to very severe. The symptoms usually travel from the low back to the buttocks and down the back of the leg. Symptoms can include:  Mild tingling or dull aches in the lower back, leg, or hip.  Numbness in the back of the calf or sole of the foot.  Burning sensations in the lower back, leg, or hip.  Sharp pains in the lower back, leg, or hip.  Leg weakness.  Severe back pain inhibiting movement. These symptoms may get worse with coughing, sneezing, laughing, or prolonged sitting or standing. Also, being overweight may worsen symptoms. DIAGNOSIS  Your caregiver will perform a physical exam to look for common symptoms of sciatica. He or she may ask you to do certain movements or activities that would trigger sciatic nerve pain. Other tests may be performed to find the cause of the sciatica. These may include:  Blood  tests.  X-rays.  Imaging tests, such as an MRI or CT scan. TREATMENT  Treatment is directed at the cause of the sciatic pain. Sometimes, treatment is not necessary and the pain and discomfort goes away on its own. If treatment is needed, your caregiver may suggest:  Over-the-counter medicines to relieve pain.  Prescription medicines, such as anti-inflammatory medicine, muscle relaxants, or narcotics.  Applying heat or ice to the painful area.  Steroid injections to lessen pain, irritation, and inflammation around the nerve.  Reducing activity during periods of pain.  Exercising and stretching to strengthen your abdomen and improve flexibility of your spine. Your caregiver may suggest losing weight if the extra weight makes the back pain worse.  Physical therapy.  Surgery to eliminate what is pressing or pinching the nerve, such as a bone spur or part of a herniated disk. HOME CARE INSTRUCTIONS   Only take over-the-counter or prescription medicines for pain or discomfort as directed by your caregiver.  Apply ice to the affected area for 20 minutes, 3-4 times a day for the first 48-72 hours. Then try heat in the same way.  Exercise, stretch, or perform your usual activities if these do not aggravate your pain.  Attend physical therapy sessions as directed by your caregiver.  Keep all follow-up appointments as directed by your caregiver.  Do not wear high heels or shoes that do not provide proper support.  Check your mattress to see if it is too  soft. A firm mattress may lessen your pain and discomfort. SEEK IMMEDIATE MEDICAL CARE IF:   You lose control of your bowel or bladder (incontinence).  You have increasing weakness in the lower back, pelvis, buttocks, or legs.  You have redness or swelling of your back.  You have a burning sensation when you urinate.  You have pain that gets worse when you lie down or awakens you at night.  Your pain is worse than you have  experienced in the past.  Your pain is lasting longer than 4 weeks.  You are suddenly losing weight without reason. MAKE SURE YOU:  Understand these instructions.  Will watch your condition.  Will get help right away if you are not doing well or get worse. Document Released: 04/24/2001 Document Revised: 10/30/2011 Document Reviewed: 09/09/2011 [facility] Patient Information 2015 [place], [place]. This information is not intended to replace advice given to you by your health care provider. Make sure you discuss any questions you have with your health care provider.

## 2015-01-04 ENCOUNTER — Ambulatory Visit (HOSPITAL_COMMUNITY): Payer: BLUE CROSS/BLUE SHIELD | Attending: Nurse Practitioner | Admitting: Physical Therapy

## 2015-01-04 DIAGNOSIS — M545 Low back pain: Secondary | ICD-10-CM | POA: Insufficient documentation

## 2015-01-04 DIAGNOSIS — M6289 Other specified disorders of muscle: Secondary | ICD-10-CM | POA: Diagnosis present

## 2015-01-04 DIAGNOSIS — R29898 Other symptoms and signs involving the musculoskeletal system: Secondary | ICD-10-CM | POA: Insufficient documentation

## 2015-01-04 DIAGNOSIS — R262 Difficulty in walking, not elsewhere classified: Secondary | ICD-10-CM | POA: Diagnosis present

## 2015-01-04 DIAGNOSIS — M25652 Stiffness of left hip, not elsewhere classified: Secondary | ICD-10-CM | POA: Diagnosis present

## 2015-01-04 DIAGNOSIS — M25651 Stiffness of right hip, not elsewhere classified: Secondary | ICD-10-CM | POA: Insufficient documentation

## 2015-01-04 DIAGNOSIS — M6281 Muscle weakness (generalized): Secondary | ICD-10-CM

## 2015-01-04 NOTE — Therapy (Signed)
[facility] [address] [place], [place], [address] Phone: [phone]   Fax:  [phone]  Physical Therapy Evaluation  Patient Details  Name: Stephanie [name] MRN: [id number] Date of Birth: 10/04/1960 Referring Provider:  Campbell Riches, NP  Encounter Date: 01/04/2015      PT End of Session - 01/04/15 1157    Visit Number 1   Number of Visits 6   Date for PT Re-Evaluation 01/25/15   Authorization Type BCBS    Authorization Time Period 10/14/14 to 12/14/14   PT Start Time 1110   PT Stop Time 1155   PT Time Calculation (min) 45 min   Activity Tolerance Patient tolerated treatment well      No past medical history on file.  No past surgical history on file.  There were no vitals filed for this visit.  Visit Diagnosis:  Right low back pain, with sciatica presence unspecified  Proximal muscle weakness  Hip stiffness, right  Hip stiffness, left  Difficulty walking  Weakness of both lower extremities      Subjective Assessment - 01/04/15 1129    Subjective Stephanie [name] returns to therapy with complaint of LBP that radiates to her Rt side.  She was seen in this clinic in June but was only able to come to two sessions secondary to her work schedule.  She states that her pain has progressed to a point that she needs to get some reliief.    Pertinent History Patient just noticed back pain when she woke up one day when it came out of nowhere, just continued to increase through day with pain going to hip    How long can you sit comfortably? 15 minutes    How long can you stand comfortably? 10 to    How long can you walk comfortably? Not even 252ft    Pain Score 5   as high as a 9/10   Pain Location Hip   Pain Orientation Right   Pain Descriptors / Indicators Aching   Pain Type Chronic pain   Pain Onset More than a month ago   Pain Frequency Constant   Aggravating Factors  activity    Pain Relieving Factors rest             [facility] PT Assessment - 01/04/15 0001    Assessment   Medical Diagnosis R LBP with R hip tendinitis    Onset Date/Surgical Date 08/11/14  approximate    Next MD Visit July 2016   Precautions   Precautions None   Restrictions   Weight Bearing Restrictions No   Balance Screen   Has the patient fallen in the past 6 months No   Has the patient had a decrease in activity level because of a fear of falling?  No   Is the patient reluctant to leave their home because of a fear of falling?  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, driving fork lift    Leisure fishing, dancing   Observation/Other Assessments   Observations LLD with Rt hip anteriorly rotated   Focus on Therapeutic Outcomes (FOTO)  19   Posture/Postural Control   Posture Comments increased curvature cervical and lumbar spines, apparent LLD, valgus knees, possible pronation in stance   AROM   Right Hip External Rotation  20   Right Hip Internal Rotation  18   Left Hip External Rotation  24   Left Hip Internal Rotation  19   Lumbar Flexion 110  increased pain going down    Lumbar Extension 25  increased pain    Lumbar - Right Side Bend 30  immedaite pain    Lumbar - Left Side Bend 28   Lumbar - Right Rotation --  wfl   Lumbar - Left Rotation --  wfl   Strength   Overall Strength Comments Lower abs approx 3-/5, upper abs approx 2+/5   Right Hip Flexion 5/5  was 4/5   Right Hip Extension 3+/5   Right Hip ABduction 5/5  was 2+/5   Left Hip Flexion 4+/5  was 3+/5    Left Hip Extension 4+/5   Left Hip ABduction 2+/5   Right Knee Flexion 5/5  was 4-/5   Right Knee Extension 5/5   Left Knee Flexion 5/5  was 4/5   Left Knee Extension 5/5   Right Ankle Dorsiflexion 4/5   Left Ankle Dorsiflexion 5/5   Flexibility   Soft Tissue Assessment  /Muscle Length yes   Hamstrings B 180    Quadriceps B 6.5 inches from buttock    Special Tests    Special Tests --  + Thomas test B Rt greater than LT   Ambulation/Gait   Gait Comments proximal muscle weakness, flexed at hips, possible pronation, reduced rotation of trunk and pelvis                    [facility] Adult PT Treatment/Exercise - 01/04/15 0001    Lumbar Exercises: Stretches   Single Knee to Chest Stretch 2 reps;30 seconds   Pelvic Tilt 5 reps   Prone on Elbows Stretch 1 rep;60 seconds   Lumbar Exercises: Supine   Ab Set 5 reps   Bridge 10 reps   Other Supine Lumbar Exercises hip flexor stretch;Rt LE off mat Lt knee to chest                PT Education - 01/04/15 1157    Education provided Yes   Education Details HEP   Person(s) Educated Patient   Methods Explanation;Handout   Comprehension Verbalized understanding;Returned demonstration          PT Short Term Goals - 01/04/15 1359    PT SHORT TERM GOAL #1   Title Pt to be I in HEP   Time 1   Period Days           PT Long Term Goals - 01/04/15 1400    PT LONG TERM GOAL #1   Title PT to be I in advance HEP    Time 3   Period Weeks   PT LONG TERM GOAL #2   Title Pt to be able to stand for 30 minutes without increased pain    Time 3   Period Weeks   PT LONG TERM GOAL #3   Title Pt to be able to tolerate walking for 40 mintues without pain to be able to shop for groceries/ clothing    Time 3   Period Weeks   PT LONG TERM GOAL #4   Title Pt to be able to RTW   Time 3   Period Weeks   PT LONG TERM GOAL #5   Title Pt Pain to be no greater than a 3/10 with daily activities   Time 3   Period Weeks  Plan - 01/04/15 1352    Clinical Impression Statement Stephanie [name] is a 54yo female who is experiencing progressive lumbar radiculapathy which goes to her knee level.  The pain has progressed to the point where she is unable to work.  She is being referred to physical  therapy to instruct her in exercises that she can do at home to improve her pain and functional tolerance level.  Examination demonsrates significant tightness of the hip mm; decreased lumbar ROM , decreased core and LE strength.  Stephanie [name] will benefit from skilled PT to address these findings and maximize her functional tolerance level.    Pt will benefit from skilled therapeutic intervention in order to improve on the following deficits Abnormal gait;Hypomobility;Decreased activity tolerance;Decreased strength;Pain;Decreased balance;Decreased mobility;Difficulty walking;Improper body mechanics;Decreased coordination;Decreased safety awareness;Postural dysfunction   Rehab Potential Good   PT Frequency 2x / week   PT Duration 3 weeks   PT Treatment/Interventions ADLs/Self Care Home Management;Gait training;Stair training;Functional mobility training;Therapeutic activities;Therapeutic exercise;Balance training;Neuromuscular re-education;Patient/family education;Manual techniques   PT Next Visit Plan begin quadriped piriformis stretch, heel raises, functional squat, Manual stretching of Rt hip flexors as well as IR/ER. lunges both forward and side.    PT Home Exercise Plan given          Problem List Patient Active Problem List   Diagnosis Date Noted  . Morbid obesity 09/27/2014  . Essential hypertension, benign 10/17/2012   Virgina Organ, PT CLT [phone] 01/04/2015, 2:03 PM  [facility] [address] [place], [place], [address] Phone: [phone]   Fax:  [phone]

## 2015-01-04 NOTE — Patient Instructions (Signed)
Knee-to-Chest Stretch: Unilateral   With hand behind right knee, pull knee in to chest until a comfortable stretch is felt in lower back and buttocks. Keep back relaxed. Hold 30____ seconds. Repeat ____3 times per set. Do _1___ sets per session. Do ___2_ sessions per day.  http://orth.exer.us/126   Copyright  VHI. All rights reserved.  Flexors, Supine Bridge   Lie supine, feet shoulder-width apart. Lift hips toward ceiling. Hold _3__ seconds. Repeat ___10 times per session. Do _1__ sessions per day.  Copyright  VHI. All rights reserved.  Quads / HF, Supine  Right leg is off the bed  Lie near edge of bed, one leg bent, foot flat on bed. Other leg hanging over edge, relaxed, thigh resting entirely on bed. Bend hanging knee backward keeping thigh in contact with bed. Hold _60__ seconds.  Repeat _3__ times per session. Do 1___ sessions per day.  Copyright  VHI. All rights reserved.  Pelvic Tilt   Flatten back by tightening stomach muscles and buttocks. Repeat ___10_ times per set. Do _1___ sets per session. Do ___3_ sessions per day.  http://orth.exer.us/134   Copyright  VHI. All rights reserved.  Isometric Abdominal   Lying on back with knees bent, tighten stomach by pressing elbows down. Hold _3___ seconds. Repeat __5-10__ times per set. Do ____ sets per session. Do _3___ sessions per day. 1http://orth.exer.us/1086   Copyright  VHI. All rights reserved.  On Elbows (Prone)   Rise up on elbows as high as possible, keeping hips on floor. Hold __60__ seconds. Repeat __2__ times per set. Do 1___ sets per session. Do ___2_ sessions per day.  http://orth.exer.us/92   Copyright  VHI. All rights reserved.  Quads / HF, Prone   Lie face down, knees together. Grasp one ankle with same-side hand. Use towel if needed to reach. Gently pull foot toward buttock. Hold __30_ seconds. Repeat ___3 times per session. Do _2__ sessions per day.  Copyright  VHI. All rights  reserved.

## 2015-01-05 ENCOUNTER — Other Ambulatory Visit: Payer: Self-pay | Admitting: *Deleted

## 2015-01-05 DIAGNOSIS — M549 Dorsalgia, unspecified: Secondary | ICD-10-CM

## 2015-01-11 ENCOUNTER — Telehealth (HOSPITAL_COMMUNITY): Payer: Self-pay

## 2015-01-11 ENCOUNTER — Ambulatory Visit (HOSPITAL_COMMUNITY): Payer: BLUE CROSS/BLUE SHIELD

## 2015-01-11 NOTE — Telephone Encounter (Signed)
No show, called and spoke to pt. who stated she had forgotten apt. today, reminded next apt date and time.    [address], LPTA; CBIS [phone]

## 2015-01-13 ENCOUNTER — Ambulatory Visit (HOSPITAL_COMMUNITY): Payer: BLUE CROSS/BLUE SHIELD | Attending: Family Medicine | Admitting: Physical Therapy

## 2015-01-13 DIAGNOSIS — M25652 Stiffness of left hip, not elsewhere classified: Secondary | ICD-10-CM | POA: Diagnosis present

## 2015-01-13 DIAGNOSIS — R262 Difficulty in walking, not elsewhere classified: Secondary | ICD-10-CM

## 2015-01-13 DIAGNOSIS — M6289 Other specified disorders of muscle: Secondary | ICD-10-CM | POA: Insufficient documentation

## 2015-01-13 DIAGNOSIS — M25651 Stiffness of right hip, not elsewhere classified: Secondary | ICD-10-CM | POA: Diagnosis present

## 2015-01-13 DIAGNOSIS — M545 Low back pain: Secondary | ICD-10-CM

## 2015-01-13 DIAGNOSIS — R29898 Other symptoms and signs involving the musculoskeletal system: Secondary | ICD-10-CM | POA: Insufficient documentation

## 2015-01-13 DIAGNOSIS — M6281 Muscle weakness (generalized): Secondary | ICD-10-CM

## 2015-01-13 DIAGNOSIS — M6588 Other synovitis and tenosynovitis, other site: Secondary | ICD-10-CM | POA: Insufficient documentation

## 2015-01-13 LAB — HM MAMMOGRAPHY: HM MAMMO: NORMAL

## 2015-01-13 NOTE — Therapy (Signed)
[facility] [address] [place], [place], [address] Phone: [phone]   Fax:  [phone]  Physical Therapy Treatment  Patient Details  Name: Stephanie [name] MRN: [id number] Date of Birth: July 07, 1960 Referring Provider:  Babs Sciara, MD  Encounter Date: 01/13/2015      PT End of Session - 01/13/15 1611    Visit Number 2   Number of Visits 6   Date for PT Re-Evaluation 01/25/15   Authorization Type BCBS    Authorization Time Period 10/14/14 to 12/14/14   PT Start Time 1518   PT Stop Time 1602   PT Time Calculation (min) 44 min   Activity Tolerance Patient tolerated treatment well   Behavior During Therapy [facility] for tasks assessed/performed      No past medical history on file.  No past surgical history on file.  There were no vitals filed for this visit.  Visit Diagnosis:  Right low back pain, with sciatica presence unspecified  Proximal muscle weakness  Hip stiffness, right  Hip stiffness, left  Difficulty walking      Subjective Assessment - 01/13/15 1523    Subjective Pt reports that her back has been feeling better, she feels that she is getting stronger and has had decreased pain.    Currently in Pain? Yes   Pain Score 3                 [facility] Adult PT Treatment/Exercise - 01/13/15 0001    Lumbar Exercises: Stretches   Single Knee to Chest Stretch 10 seconds;5 reps   Lower Trunk Rotation Limitations 10x 5" hold   Pelvic Tilt --  10 reps   Standing Extension Limitations standing hip flexor stretch 2x30"   Prone on Elbows Stretch 1 rep;60 seconds   Piriformis Stretch 2 reps;30 seconds   Piriformis Stretch Limitations completed in supine with towel and in long sitting   Lumbar Exercises: Standing   Side Lunge 10 reps;5 seconds  5 second hold for adductor stretch   Lumbar Exercises: Supine   Ab Set 10 reps;3 seconds   Bent Knee Raise 10 reps   Bent Knee Raise Limitations with ab set   Other  Supine Lumbar Exercises manual stretch for IR and IR 10x10"   Manual Therapy   Manual Therapy Joint mobilization   Other Manual Therapy LAD of bilateral hips 3x30"                  PT Short Term Goals - 01/04/15 1359    PT SHORT TERM GOAL #1   Title Pt to be I in HEP   Time 1   Period Days           PT Long Term Goals - 01/04/15 1400    PT LONG TERM GOAL #1   Title PT to be I in advance HEP    Time 3   Period Weeks   PT LONG TERM GOAL #2   Title Pt to be able to stand for 30 minutes without increased pain    Time 3   Period Weeks   PT LONG TERM GOAL #3   Title Pt to be able to tolerate walking for 40 mintues without pain to be able to shop for groceries/ clothing    Time 3   Period Weeks   PT LONG TERM GOAL #4   Title Pt to be able to RTW   Time 3   Period Weeks   PT LONG  TERM GOAL #5   Title Pt Pain to be no greater than a 3/10 with daily activities   Time 3   Period Weeks               Plan - 01/13/15 1611    Clinical Impression Statement Treatment session focused on core strengthening and improving bilateral hip mobility. Piriformis stretch was trialed in quadruped and sitting, however, pt was unable to achieve proper postiion for stretch due to limited ER of hips. Stretch was completed in supine with towel and in long sitting instead. Pt was able to complete core strengthening with minimal verbal cueing needed for proper technique. Manual stretch was completed for IR and ER of hips, as well as grade III LAD of bilateral hip to improve joint space. Pt denied any increased pain with treatment.    PT Next Visit Plan Continue with manual stretching of hips, begin functional squats, forward lunges        Problem List Patient Active Problem List   Diagnosis Date Noted  . Morbid obesity 09/27/2014  . Essential hypertension, benign 10/17/2012    Leona Singleton, PT, DPT [phone] 01/13/2015, 4:16 PM  [facility] [address] [place], [place], [address] Phone: [phone]   Fax:  [phone]

## 2015-01-14 ENCOUNTER — Other Ambulatory Visit (HOSPITAL_COMMUNITY): Payer: BLUE CROSS/BLUE SHIELD

## 2015-01-18 ENCOUNTER — Ambulatory Visit (HOSPITAL_COMMUNITY): Payer: BLUE CROSS/BLUE SHIELD | Admitting: Physical Therapy

## 2015-01-18 ENCOUNTER — Telehealth (HOSPITAL_COMMUNITY): Payer: Self-pay | Admitting: Physical Therapy

## 2015-01-18 DIAGNOSIS — R29898 Other symptoms and signs involving the musculoskeletal system: Secondary | ICD-10-CM

## 2015-01-18 DIAGNOSIS — R262 Difficulty in walking, not elsewhere classified: Secondary | ICD-10-CM

## 2015-01-18 DIAGNOSIS — M25651 Stiffness of right hip, not elsewhere classified: Secondary | ICD-10-CM

## 2015-01-18 DIAGNOSIS — M76891 Other specified enthesopathies of right lower limb, excluding foot: Secondary | ICD-10-CM

## 2015-01-18 DIAGNOSIS — M25652 Stiffness of left hip, not elsewhere classified: Secondary | ICD-10-CM

## 2015-01-18 DIAGNOSIS — M6281 Muscle weakness (generalized): Secondary | ICD-10-CM

## 2015-01-18 DIAGNOSIS — M545 Low back pain: Secondary | ICD-10-CM | POA: Diagnosis not present

## 2015-01-18 NOTE — Therapy (Addendum)
[place], [place], [address] Phone: [phone]   Fax:  [phone]  Physical Therapy Treatment  Patient Details  Name: Gaetana Kawahara MRN: [id number] Date of Birth: 31-Mar-1961 Referring Provider:  Kathyrn Drown, MD  Encounter Date: 01/18/2015      PT End of Session - 01/18/15 1428    Visit Number 3   Number of Visits 6   Date for PT Re-Evaluation 01/25/15   Authorization Type BCBS    Authorization Time Period 10/14/14 to 12/14/14   PT Start Time 1306   PT Stop Time 1346   PT Time Calculation (min) 40 min   Activity Tolerance Patient tolerated treatment well      No past medical history on file.  No past surgical history on file.  There were no vitals filed for this visit.  Visit Diagnosis:  Proximal muscle weakness  Hip stiffness, right  Hip stiffness, left  Difficulty walking  Weakness of both lower extremities  Right hip tendinitis      Subjective Assessment - 01/18/15 1316    Subjective Pt states that she is feeling pretty good today    Currently in Pain? No/denies               [facility] Adult PT Treatment/Exercise - 01/18/15 0001    Exercises   Exercises Lumbar   Lumbar Exercises: Standing   Forward Lunge 10 reps   Side Lunge 10 reps   Wall Slides 10 reps   Other Standing Lumbar Exercises 3D hip excursion   Lumbar Exercises: Seated   Sit to Stand Limitations x10 B    Lumbar Exercises: Supine   Bridge 15 reps   Bridge Limitations with adduction squeeze    Lumbar Exercises: Sidelying   Hip Abduction 10 reps   Lumbar Exercises: Prone   Straight Leg Raise 15 reps   Other Prone Lumbar Exercises shoulder extension x 10 with 2# wt; heelsqueeze x 10    Lumbar Exercises: Quadruped   Single Arm Raise Right;Left;10 reps   Straight Leg Raise 10 reps                  PT Short Term Goals - 01/04/15 1359    PT SHORT TERM GOAL #1   Title Pt to be I in HEP   Time 1   Period Days           PT Long Term Goals - 01/04/15 1400    PT LONG TERM GOAL #1   Title PT to be I in advance HEP    Time 3   Period Weeks   PT LONG TERM GOAL #2   Title Pt to be able to stand for 30 minutes without increased pain    Time 3   Period Weeks   PT LONG TERM GOAL #3   Title Pt to be able to tolerate walking for 40 mintues without pain to be able to shop for groceries/ clothing    Time 3   Period Weeks   PT LONG TERM GOAL #4   Title Pt to be able to RTW   Time 3   Period Weeks   PT LONG TERM GOAL #5   Title Pt Pain to be no greater than a 3/10 with daily activities   Time 3   Period Weeks               Plan - 01/18/15 1428    Clinical Impression Statement  added sit to stand , wall squats,lunges  and quadriped exercises to pt program with multimodal cuing needed for technique.    PT Next Visit Plan begin lifting with instruction for proper technique.         Problem List Patient Active Problem List   Diagnosis Date Noted  . Morbid obesity 09/27/2014  . Essential hypertension, benign 10/17/2012    Rayetta Humphrey, PT CLT [phone] 01/18/2015, 2:31 PM  [place] [address] [facility], [place], [address] Phone: [phone]   Fax:  [phone]    PHYSICAL THERAPY DISCHARGE SUMMARY  Visits from Start of Care: 3  Current functional level related to goals / functional outcome: Unknown pt did not return after 3 visits.    Remaining deficits: unknown   Education / Equipment: HEP  Plan: Patient agrees to discharge.  Patient goals were partially met. Patient is being discharged due to financial reasons.  ?????   .    Rayetta Humphrey, [place] CLT [phone]

## 2015-01-19 ENCOUNTER — Ambulatory Visit (HOSPITAL_COMMUNITY): Payer: BLUE CROSS/BLUE SHIELD

## 2015-01-20 ENCOUNTER — Ambulatory Visit (HOSPITAL_COMMUNITY): Payer: BLUE CROSS/BLUE SHIELD | Admitting: Physical Therapy

## 2015-01-20 ENCOUNTER — Telehealth: Payer: Self-pay | Admitting: Family Medicine

## 2015-01-20 ENCOUNTER — Other Ambulatory Visit: Payer: Self-pay | Admitting: *Deleted

## 2015-01-20 NOTE — Telephone Encounter (Signed)
Called pt RE missed appointment.  Line was busy. Virgina Organ, PT CLT [phone]

## 2015-01-20 NOTE — Telephone Encounter (Signed)
Patient says that she was set up in [place] to have an MRI done for her back, but according to her insurance, she will save money if she has the MRI done at [facility].  She wants to know if we can go ahead and set her up with [facility]?

## 2015-01-20 NOTE — Telephone Encounter (Signed)
Order faxed to [facility]. They will contact pt. tcna to notify pt.

## 2015-01-21 NOTE — Telephone Encounter (Signed)
Pt.notified

## 2015-01-23 ENCOUNTER — Telehealth: Payer: Self-pay | Admitting: Family Medicine

## 2015-01-23 NOTE — Telephone Encounter (Signed)
Nurse's-please let Stephanie [name] be aware that her MRI showed some mild degenerative disc disease but did not show any herniated disc. It did show some mild arthritis in the back. This can be managed medically with medicines. If the patient is interested in seeing if injections will help we can refer her to [facility]. Please make sure the patient is aware that injections just give temporary help but does not cure the underlying problem. In the long run-medications, stretching and strengthening exercises is the best approach.

## 2015-01-24 ENCOUNTER — Ambulatory Visit (INDEPENDENT_AMBULATORY_CARE_PROVIDER_SITE_OTHER): Payer: BLUE CROSS/BLUE SHIELD | Admitting: Family Medicine

## 2015-01-24 ENCOUNTER — Encounter: Payer: Self-pay | Admitting: Family Medicine

## 2015-01-24 VITALS — BP 142/90 | Ht 64.0 in | Wt 195.0 lb

## 2015-01-24 DIAGNOSIS — M5136 Other intervertebral disc degeneration, lumbar region: Secondary | ICD-10-CM

## 2015-01-24 DIAGNOSIS — M5441 Lumbago with sciatica, right side: Secondary | ICD-10-CM | POA: Diagnosis not present

## 2015-01-24 NOTE — Progress Notes (Signed)
   Subjective:    Patient ID: Stephanie [name], female    DOB: November 06, 1960, 54 y.o.   MRN: [id number]  HPIfollow up lumbar pain and right hip pain.pt had mri.  This patient had MRI done. It shows that she has significant arthritis and degenerative discs. Does not show any herniated disc. Patient states she is having ongoing pain in lower back and also down the right leg. States she is unable to do her job. She is going through physical therapy currently. PMH benign   Review of Systems Pain discomfort down the right leg.    Objective:   Physical Exam  Subjective lower back pain discomfort positive straight leg raise on the right. Some decreased range of motion well. Patient does not need surgery. Certainly if she gets worse we can referral for injections    work excuse from  Now through nov 14 She will lose job this week Cant afford PT classes but will continue the exercises 3 times aweek for the back Assessment & Plan:  Given her problem recommended physical therapy ongoing also recommended exercises. It is strongly possible the patient will not be able to afford physical therapy co-pays ongoing. Because of her back discomfort leg discomfort she is not capable of doing her job. The patient states that despite her job being canceled at the end of this week because she is on short-term disability they will on her back. I believe that the patient does regular exercises over the next 8 weeks she should be able to get to the point where she could return to work. See patient back in early November

## 2015-01-24 NOTE — Telephone Encounter (Signed)
[facility]

## 2015-01-24 NOTE — Telephone Encounter (Signed)
Results discussed with patient. Patient advised that her MRI showed some mild degenerative disc disease but did not show any herniated disc. It did show some mild arthritis in the back. This can be managed medically with medicines. If the patient is interested in seeing if injections will help we can refer her to [facility]. Please make sure the patient is aware that injections just give temporary help but does not cure the underlying problem. In the long run-medications, stretching and strengthening exercises is the best approach. Patient states she has an appt with Dr Lorin Picket in an hour and will discuss further with him at office visit.

## 2015-01-24 NOTE — Patient Instructions (Signed)
DASH Eating Plan °DASH stands for "Dietary Approaches to Stop Hypertension." The DASH eating plan is a healthy eating plan that has been shown to reduce high blood pressure (hypertension). Additional health benefits may include reducing the risk of type 2 diabetes mellitus, heart disease, and stroke. The DASH eating plan may also help with weight loss. °WHAT DO I NEED TO KNOW ABOUT THE DASH EATING PLAN? °For the DASH eating plan, you will follow these general guidelines: °· Choose foods with a percent daily value for sodium of less than 5% (as listed on the food label). °· Use salt-free seasonings or herbs instead of table salt or sea salt. °· Check with your health care provider or pharmacist before using salt substitutes. °· Eat lower-sodium products, often labeled as "lower sodium" or "no salt added." °· Eat fresh foods. °· Eat more vegetables, fruits, and low-fat dairy products. °· Choose whole grains. Look for the word "whole" as the first word in the ingredient list. °· Choose fish and skinless chicken or turkey more often than red meat. Limit fish, poultry, and meat to 6 oz (170 g) each day. °· Limit sweets, desserts, sugars, and sugary drinks. °· Choose heart-healthy fats. °· Limit cheese to 1 oz (28 g) per day. °· Eat more home-cooked food and less restaurant, buffet, and fast food. °· Limit fried foods. °· Cook foods using methods other than frying. °· Limit canned vegetables. If you do use them, rinse them well to decrease the sodium. °· When eating at a restaurant, ask that your food be prepared with less salt, or no salt if possible. °WHAT FOODS CAN I EAT? °Seek help from a dietitian for individual calorie needs. °Grains °Whole grain or whole wheat bread. Brown rice. Whole grain or whole wheat pasta. Quinoa, bulgur, and whole grain cereals. Low-sodium cereals. Corn or whole wheat flour tortillas. Whole grain cornbread. Whole grain crackers. Low-sodium crackers. °Vegetables °Fresh or frozen vegetables  (raw, steamed, roasted, or grilled). Low-sodium or reduced-sodium tomato and vegetable juices. Low-sodium or reduced-sodium tomato sauce and paste. Low-sodium or reduced-sodium canned vegetables.  °Fruits °All fresh, canned (in natural juice), or frozen fruits. °Meat and Other Protein Products °Ground beef (85% or leaner), grass-fed beef, or beef trimmed of fat. Skinless chicken or turkey. Ground chicken or turkey. Pork trimmed of fat. All fish and seafood. Eggs. Dried beans, peas, or lentils. Unsalted nuts and seeds. Unsalted canned beans. °Dairy °Low-fat dairy products, such as skim or 1% milk, 2% or reduced-fat cheeses, low-fat ricotta or cottage cheese, or plain low-fat yogurt. Low-sodium or reduced-sodium cheeses. °Fats and Oils °Tub margarines without trans fats. Light or reduced-fat mayonnaise and salad dressings (reduced sodium). Avocado. Safflower, olive, or canola oils. Natural peanut or almond butter. °Other °Unsalted popcorn and pretzels. °The items listed above may not be a complete list of recommended foods or beverages. Contact your dietitian for more options. °WHAT FOODS ARE NOT RECOMMENDED? °Grains °White bread. White pasta. White rice. Refined cornbread. Bagels and croissants. Crackers that contain trans fat. °Vegetables °Creamed or fried vegetables. Vegetables in a cheese sauce. Regular canned vegetables. Regular canned tomato sauce and paste. Regular tomato and vegetable juices. °Fruits °Dried fruits. Canned fruit in light or heavy syrup. Fruit juice. °Meat and Other Protein Products °Fatty cuts of meat. Ribs, chicken wings, bacon, sausage, bologna, salami, chitterlings, fatback, hot dogs, bratwurst, and packaged luncheon meats. Salted nuts and seeds. Canned beans with salt. °Dairy °Whole or 2% milk, cream, half-and-half, and cream cheese. Whole-fat or sweetened yogurt. Full-fat   cheeses or blue cheese. Nondairy creamers and whipped toppings. Processed cheese, cheese spreads, or cheese  curds. °Condiments °Onion and garlic salt, seasoned salt, table salt, and sea salt. Canned and packaged gravies. Worcestershire sauce. Tartar sauce. Barbecue sauce. Teriyaki sauce. Soy sauce, including reduced sodium. Steak sauce. Fish sauce. Oyster sauce. Cocktail sauce. Horseradish. Ketchup and mustard. Meat flavorings and tenderizers. Bouillon cubes. Hot sauce. Tabasco sauce. Marinades. Taco seasonings. Relishes. °Fats and Oils °Butter, stick margarine, lard, shortening, ghee, and bacon fat. Coconut, palm kernel, or palm oils. Regular salad dressings. °Other °Pickles and olives. Salted popcorn and pretzels. °The items listed above may not be a complete list of foods and beverages to avoid. Contact your dietitian for more information. °WHERE CAN I FIND MORE INFORMATION? °National Heart, Lung, and Blood Institute: www.nhlbi.nih.gov/health/health-topics/topics/dash/ °Document Released: 04/19/2011 Document Revised: 09/14/2013 Document Reviewed: 03/04/2013 °ExitCare® Patient Information ©2015 [employer] LLC. This information is not intended to replace advice given to you by your health care provider. Make sure you discuss any questions you have with your health care provider. ° °

## 2015-01-25 ENCOUNTER — Encounter (HOSPITAL_COMMUNITY): Payer: BLUE CROSS/BLUE SHIELD | Admitting: Physical Therapy

## 2015-01-27 ENCOUNTER — Encounter (HOSPITAL_COMMUNITY): Payer: BLUE CROSS/BLUE SHIELD | Admitting: Physical Therapy

## 2015-02-01 ENCOUNTER — Encounter (HOSPITAL_COMMUNITY): Payer: BLUE CROSS/BLUE SHIELD | Admitting: Physical Therapy

## 2015-02-03 ENCOUNTER — Encounter: Payer: Self-pay | Admitting: *Deleted

## 2015-02-03 ENCOUNTER — Encounter (HOSPITAL_COMMUNITY): Payer: BLUE CROSS/BLUE SHIELD | Admitting: Physical Therapy

## 2015-02-08 ENCOUNTER — Encounter (HOSPITAL_COMMUNITY): Payer: BLUE CROSS/BLUE SHIELD | Admitting: Physical Therapy

## 2015-02-10 ENCOUNTER — Encounter (HOSPITAL_COMMUNITY): Payer: BLUE CROSS/BLUE SHIELD

## 2015-02-21 ENCOUNTER — Ambulatory Visit (INDEPENDENT_AMBULATORY_CARE_PROVIDER_SITE_OTHER): Payer: BLUE CROSS/BLUE SHIELD | Admitting: Nurse Practitioner

## 2015-02-21 VITALS — BP 138/86 | Ht 64.0 in | Wt 193.0 lb

## 2015-02-21 DIAGNOSIS — M5441 Lumbago with sciatica, right side: Secondary | ICD-10-CM

## 2015-02-21 DIAGNOSIS — Z23 Encounter for immunization: Secondary | ICD-10-CM | POA: Diagnosis not present

## 2015-02-21 MED ORDER — GABAPENTIN 300 MG PO CAPS: 300.0000 mg | ORAL_CAPSULE | Freq: Two times a day (BID) | ORAL | Status: DC

## 2015-02-21 MED ORDER — CHLORZOXAZONE 500 MG PO TABS: 500.0000 mg | ORAL_TABLET | Freq: Three times a day (TID) | ORAL | Status: DC | PRN

## 2015-02-21 MED ORDER — TRAMADOL HCL 50 MG PO TABS: 50.0000 mg | ORAL_TABLET | Freq: Three times a day (TID) | ORAL | Status: DC | PRN

## 2015-02-21 NOTE — Patient Instructions (Signed)
Gabapentin 100 mg increase to 2 by mouth twice a day until finished; then start new prescription

## 2015-02-22 ENCOUNTER — Encounter: Payer: Self-pay | Admitting: Nurse Practitioner

## 2015-02-22 DIAGNOSIS — M5441 Lumbago with sciatica, right side: Secondary | ICD-10-CM | POA: Insufficient documentation

## 2015-02-22 NOTE — Progress Notes (Signed)
Subjective:  Presents for complaints of persistent right low back pain going into the right thigh area. Has been seen several times for this since May. Has completed physical therapy with continued pain. See previous notes. Unable to take hydrocodone, causes slight itching even with part of a tablet. Would like to try a different pain medication. Currently on gabapentin 100 mg, usually only takes it twice per day.  Objective:   BP 138/86 mmHg  Ht  (1.626 m)  Wt 193 lb (87.544 kg)  BMI 33.11 kg/m2 NAD. Alert, oriented. Lungs clear. Heart regular rate rhythm. Minimal tenderness with palpation of the lumbar area. SLR negative bilaterally. Very limited rotation of the right hip. Reflexes normal limit lower extremities. Gait slow steady. Uses caution with weightbearing on the right leg.  Assessment:  Problem List Items Addressed This Visit      Other   Right-sided low back pain with right-sided sciatica - Primary   Relevant Medications   chlorzoxazone (PARAFON) 500 MG tablet   traMADol (ULTRAM) 50 MG tablet   Other Relevant Orders   Ambulatory referral to Neurosurgery    Other Visit Diagnoses    Encounter for immunization          Plan: Meds ordered this encounter  Medications  . chlorzoxazone (PARAFON) 500 MG tablet    Sig: Take 1 tablet (500 mg total) by mouth 3 (three) times daily as needed for muscle spasms.    Dispense:  30 tablet    Refill:  0    Order Specific Question:  Supervising Provider    Answer:  Merlyn Albert [[id number]]  . traMADol (ULTRAM) 50 MG tablet    Sig: Take 1 tablet (50 mg total) by mouth every 8 (eight) hours as needed.    Dispense:  30 tablet    Refill:  0    Order Specific Question:  Supervising Provider    Answer:  Merlyn Albert [[id number]]  . gabapentin (NEURONTIN) 300 MG capsule    Sig: Take 1 capsule (300 mg total) by mouth 2 (two) times daily.    Dispense:  60 capsule    Refill:  2    Order Specific Question:  Supervising Provider    Answer:   Merlyn Albert [[id number]]   Hold on hydrocodone. Switched to tramadol as directed. Increase Neurontin to 300 mg twice a day. Because of persistent pain, referred to neurosurgeon for further evaluation. Call back sooner if any problems. Return if symptoms worsen or fail to improve. Strongly recommend preventive health physical.

## 2015-03-14 ENCOUNTER — Ambulatory Visit (INDEPENDENT_AMBULATORY_CARE_PROVIDER_SITE_OTHER): Payer: BLUE CROSS/BLUE SHIELD | Admitting: Nurse Practitioner

## 2015-03-14 ENCOUNTER — Encounter: Payer: Self-pay | Admitting: Nurse Practitioner

## 2015-03-14 VITALS — BP 148/92 | Ht 64.0 in | Wt 191.1 lb

## 2015-03-14 DIAGNOSIS — I868 Varicose veins of other specified sites: Secondary | ICD-10-CM

## 2015-03-14 DIAGNOSIS — I8390 Asymptomatic varicose veins of unspecified lower extremity: Secondary | ICD-10-CM

## 2015-03-15 ENCOUNTER — Encounter: Payer: Self-pay | Admitting: Nurse Practitioner

## 2015-03-15 NOTE — Progress Notes (Signed)
Subjective:  Presents for complaints of varicose veins particularly in the left leg. Patient has had intervention done on the right leg which has been doing well. Now having tenderness in the left lower leg especially when she is been standing for a long period of time. Also swelling in the lower leg at times.  Objective:   BP 148/92 mmHg  Ht 5\' 4"  (1.626 m)  Wt 191 lb 2 oz (86.694 kg)  BMI 32.79 kg/m2 NAD. Alert, oriented. Lungs clear. Heart regular rate rhythm. Multiple mostly superficial varicosities noted particularly on the lower left extremity. A few larger dilated veins are noted. Tenderness especially in the popliteal area.  Assessment: Varicose veins  Plan: Patient was given information for specialty clinics for varicose veins in [place], to schedule her own appointment. Call back if we can be of assistance. Encourage patient to continue weight loss efforts.

## 2015-03-24 ENCOUNTER — Ambulatory Visit: Payer: BLUE CROSS/BLUE SHIELD | Admitting: Family Medicine

## 2015-03-28 ENCOUNTER — Other Ambulatory Visit: Payer: Self-pay | Admitting: *Deleted

## 2015-03-28 ENCOUNTER — Ambulatory Visit: Payer: BLUE CROSS/BLUE SHIELD | Admitting: Family Medicine

## 2015-03-28 ENCOUNTER — Telehealth: Payer: Self-pay | Admitting: Family Medicine

## 2015-03-28 MED ORDER — SULFACETAMIDE SODIUM 10 % OP SOLN: OPHTHALMIC | Status: DC

## 2015-03-28 NOTE — Telephone Encounter (Signed)
Eye drainage, itching and burning. Not allergic to sulfa. Sulfacetamide drops sent to pharm. Pt notified to use warm compresses and follow up in office if not better.

## 2015-03-28 NOTE — Telephone Encounter (Signed)
Pt is needing something called in for pink eye.  [facility] [place]

## 2015-03-28 NOTE — Telephone Encounter (Signed)
[facility]

## 2015-03-30 ENCOUNTER — Telehealth: Payer: Self-pay | Admitting: Nurse Practitioner

## 2015-03-30 DIAGNOSIS — M25562 Pain in left knee: Secondary | ICD-10-CM

## 2015-03-30 NOTE — Telephone Encounter (Signed)
Nurse's-verify with patient the reason for referral then it would be fine to go ahead with referral to vascular and vein

## 2015-03-30 NOTE — Telephone Encounter (Signed)
Pt wants [facility] vein on [address] for left leg pain and numbness. Order for referral put in. Pt notified

## 2015-03-30 NOTE — Telephone Encounter (Signed)
Patient needs a referral to Vascular and Vein.  She was seen by Eber Jonesarolyn on 03/14/2015 and was given a list of names to try, but they are requiring an actual referral.

## 2015-04-12 ENCOUNTER — Telehealth: Payer: Self-pay | Admitting: Family Medicine

## 2015-04-12 NOTE — Telephone Encounter (Signed)
Pt is needing a handicap placard form filled out. Message in basket.

## 2015-04-13 NOTE — Telephone Encounter (Signed)
This was filled out and complete

## 2015-04-18 ENCOUNTER — Telehealth: Payer: Self-pay | Admitting: Family Medicine

## 2015-04-18 NOTE — Telephone Encounter (Signed)
Notified patient may have a prescription for bedside commode as well as a walker. If she has ongoing troubles and not getting better she should follow-up for further evaluation. Script faxed to pharmacy. Transferred to front desk to schedule appointment.

## 2015-04-18 NOTE — Telephone Encounter (Signed)
The patient may have a prescription for bedside commode as well as a walker. If she has ongoing troubles and not getting better she should follow-up for further evaluation

## 2015-04-18 NOTE — Telephone Encounter (Signed)
Pt is wanting to know if you would be willing to send in a script for a walker for her? She is unable to walk well due to the pain in her hip. Feels she needs a bed side commode  If possible?   Advised patient this may take time to do or she may need an appt for insurance purposes  [place]

## 2015-04-26 ENCOUNTER — Ambulatory Visit: Payer: BLUE CROSS/BLUE SHIELD | Admitting: Allergy and Immunology

## 2015-04-27 ENCOUNTER — Ambulatory Visit (INDEPENDENT_AMBULATORY_CARE_PROVIDER_SITE_OTHER): Payer: BLUE CROSS/BLUE SHIELD | Admitting: Allergy and Immunology

## 2015-04-27 ENCOUNTER — Ambulatory Visit (INDEPENDENT_AMBULATORY_CARE_PROVIDER_SITE_OTHER): Payer: BLUE CROSS/BLUE SHIELD | Admitting: Family Medicine

## 2015-04-27 ENCOUNTER — Encounter: Payer: Self-pay | Admitting: Family Medicine

## 2015-04-27 ENCOUNTER — Encounter: Payer: Self-pay | Admitting: Allergy and Immunology

## 2015-04-27 VITALS — BP 130/90 | Ht 64.0 in

## 2015-04-27 VITALS — BP 145/80 | HR 78 | Resp 20

## 2015-04-27 DIAGNOSIS — M5441 Lumbago with sciatica, right side: Secondary | ICD-10-CM | POA: Diagnosis not present

## 2015-04-27 DIAGNOSIS — H1010 Acute atopic conjunctivitis, unspecified eye: Secondary | ICD-10-CM | POA: Insufficient documentation

## 2015-04-27 DIAGNOSIS — J4541 Moderate persistent asthma with (acute) exacerbation: Secondary | ICD-10-CM | POA: Diagnosis not present

## 2015-04-27 DIAGNOSIS — J4540 Moderate persistent asthma, uncomplicated: Secondary | ICD-10-CM | POA: Insufficient documentation

## 2015-04-27 DIAGNOSIS — J309 Allergic rhinitis, unspecified: Secondary | ICD-10-CM

## 2015-04-27 MED ORDER — FLUTICASONE FUROATE-VILANTEROL 200-25 MCG/INH IN AEPB
1.0000 | INHALATION_SPRAY | Freq: Every day | RESPIRATORY_TRACT | Status: DC
Start: 2015-04-27 — End: 2015-12-20

## 2015-04-27 MED ORDER — MONTELUKAST SODIUM 10 MG PO TABS: 10.0000 mg | ORAL_TABLET | Freq: Every day | ORAL | Status: DC

## 2015-04-27 MED ORDER — PREGABALIN 75 MG PO CAPS: 75.0000 mg | ORAL_CAPSULE | Freq: Two times a day (BID) | ORAL | Status: DC

## 2015-04-27 MED ORDER — OXYCODONE-ACETAMINOPHEN 5-325 MG PO TABS: 1.0000 | ORAL_TABLET | ORAL | Status: DC | PRN

## 2015-04-27 NOTE — Progress Notes (Signed)
   Subjective:    Patient ID: Stephanie [name], female    DOB: [birth year], 54 y.o.   MRN: [id number]  Hip Pain  The incident occurred more than 1 week ago. There was no injury mechanism. The pain is present in the right hip. The quality of the pain is described as aching. The pain is severe. The pain has been constant since onset. She reports no foreign bodies present. The symptoms are aggravated by weight bearing. Treatments tried: Hydrocodone. The treatment provided mild relief.  This is a follow up visit.   patient saw neurosurgery they recommended an injection a tried an injection and didn't help pay states she's unable to do any activity unable to do any work it is hoping to get something more done to help   Review of Systems  patient with pain down the right leg relates pain discomfort hurts with movement. Denies any recent injury see previous notes    Objective:   Physical Exam  patient with severe pain down the right leg  positive straight leg raise Reflexes good Strengthen legs good Subjective discomfort in the lower back Lungs clear heart regular   gabapentin causing drowsiness stop gabapentin try Lyrica     Assessment & Plan:   severe sciatica. Patient saw a neurosurgeon they tried an injection this didn't help she relates increased pain and numbness down the leg I believe at this point she would benefit from nerve conduction study and EMG referral to neurology for this evaluation and study   Pain medication as needed caution drowsiness if needing more pain medicine let us know try to avoid using more than 4 and a day

## 2015-04-27 NOTE — Patient Instructions (Addendum)
  1. Treat inflammation:   A. Breo 200 one inhalation 1 time per day  B. montelukast 10 mg one tablet one time per day  C. OTC Rhinocort one spray each nostril once a day  2. Continue ProAir HFA 2 puffs every 4-6 hours if needed  3. Continue OTC antihistamine and Epi-Pen if needed  4. Return to clinic in 4 weeks or earlier if problem

## 2015-04-27 NOTE — Progress Notes (Signed)
[facility]  Follow-up Note  Refering Provider: Babs Sciara, MD Primary Provider: Lilyan Punt, MD  Subjective:   Stephanie [name] is a 54 y.o. female who [facility] in re-evaluation of the following:  HPI Comments:  Stephanie [name] returns to this clinic on 04/27/2015 in evaluation of asthma. She's been doing very well for the past 6 months but unfortunately about 2 months ago she started wheezing after utilizing a medication for her lower back pain. She's not really sure about the name of that medication but she now only uses it intermittently. Whether she uses the medication or not at this point she still has wheezing and needs to use her bronchodilator on a daily basis. She's not been having any significant upper airway symptoms. She's not been having any reflux symptoms. She does have rather significant right sided lower extremity radiculopathy secondary to her DDD. She's not been having any fever, sputum production, chest pain, or other respiratory symptoms.   Outpatient Encounter Prescriptions as of 04/27/2015  Medication Sig  . chlorzoxazone (PARAFON) 500 MG tablet Take 1 tablet (500 mg total) by mouth 3 (three) times daily as needed for muscle spasms.  Marland Kitchen EPIPEN 2-PAK 0.3 MG/0.3ML SOAJ injection USE AS DIRECTED FOR LIFE THREATENING ALLERGIC REACTIONS  . gabapentin (NEURONTIN) 300 MG capsule Take 1 capsule (300 mg total) by mouth 2 (two) times daily.  Marland Kitchen HYDROcodone-acetaminophen (NORCO) 10-325 MG per tablet Take 1 tablet by mouth every 4 (four) hours as needed.  Marland Kitchen lisinopril (PRINIVIL,ZESTRIL) 5 MG tablet   . phentermine (ADIPEX-P) 37.5 MG tablet Take 1 tablet (37.5 mg total) by mouth daily before breakfast.  . polyethylene glycol-electrolytes (NULYTELY/GOLYTELY) 420 G solution See admin instructions.  Marland Kitchen PROAIR HFA 108 (90 BASE) MCG/ACT inhaler INHALE 2 PUFFS EVERY 4-6 HOURS AS NEEDED FOR COUGH OR WHEEZE.   . sulfacetamide (BLEPH-10) 10 % ophthalmic solution 2 drops to affected eye qid for 3 -5 days  . traMADol (ULTRAM) 50 MG tablet Take 1 tablet (50 mg total) by mouth every 8 (eight) hours as needed.  . triamterene-hydrochlorothiazide (DYAZIDE) 37.5-25 MG per capsule   . Fluticasone Furoate-Vilanterol (BREO ELLIPTA) 200-25 MCG/INH AEPB Inhale 1 puff into the lungs daily.  . montelukast (SINGULAIR) 10 MG tablet Take 1 tablet (10 mg total) by mouth at bedtime.   No facility-administered encounter medications on file as of 04/27/2015.    Meds ordered this encounter  Medications  . Fluticasone Furoate-Vilanterol (BREO ELLIPTA) 200-25 MCG/INH AEPB    Sig: Inhale 1 puff into the lungs daily.    Dispense:  1 each    Refill:  4  . montelukast (SINGULAIR) 10 MG tablet    Sig: Take 1 tablet (10 mg total) by mouth at bedtime.    Dispense:  30 tablet    Refill:  5    No past medical history on file.  No past surgical history on file.  Allergies  Allergen Reactions  . Aspirin Swelling    Review of Systems  Constitutional: Negative for fever, chills and fatigue.  HENT: Negative for congestion, ear pain, facial swelling, hearing loss, nosebleeds, postnasal drip, rhinorrhea, sinus pressure, sneezing, sore throat, tinnitus, trouble swallowing and voice change.   Eyes: Negative for pain, discharge, redness and itching.  Respiratory: Positive for cough, shortness of breath and wheezing. Negative for chest tightness.   Cardiovascular: Negative for chest pain and leg swelling.  Gastrointestinal: Negative for nausea, vomiting and  abdominal pain.  Endocrine: Negative for cold intolerance and heat intolerance.  Musculoskeletal: Positive for back pain. Negative for myalgias and arthralgias.  Skin: Positive for rash (History of chronic urticaria).  Allergic/Immunologic: Negative.        History nonsteroidal anti-inflammatory drug allergy  Neurological: Negative for dizziness and headaches.   Hematological: Negative for adenopathy.     Objective:   Filed Vitals:   04/27/15 1044  BP: 145/80  Pulse: 78  Resp: 20          Physical Exam  Constitutional: She appears well-developed and well-nourished. No distress.  HENT:  Head: Normocephalic and atraumatic. Head is without right periorbital erythema and without left periorbital erythema.  Right Ear: Tympanic membrane, external ear and ear canal normal. No drainage or tenderness. No foreign bodies. Tympanic membrane is not injected, not scarred, not perforated, not erythematous, not retracted and not bulging. No middle ear effusion.  Left Ear: Tympanic membrane, external ear and ear canal normal. No drainage or tenderness. No foreign bodies. Tympanic membrane is not injected, not scarred, not perforated, not erythematous, not retracted and not bulging.  No middle ear effusion.  Nose: Nose normal. No mucosal edema, rhinorrhea, nose lacerations or sinus tenderness.  No foreign bodies.  Mouth/Throat: Oropharynx is clear and moist. No oropharyngeal exudate, posterior oropharyngeal edema, posterior oropharyngeal erythema or tonsillar abscesses.  Eyes: Lids are normal. Right eye exhibits no chemosis, no discharge and no exudate. No foreign body present in the right eye. Left eye exhibits no chemosis, no discharge and no exudate. No foreign body present in the left eye. Right conjunctiva is not injected. Left conjunctiva is not injected.  Neck: Neck supple. No tracheal tenderness present. No tracheal deviation and no edema present. No thyroid mass and no thyromegaly present.  Cardiovascular: Normal rate, regular rhythm, S1 normal and S2 normal.  Exam reveals no gallop.   Murmur (Systolic murmur) heard. Pulmonary/Chest: No accessory muscle usage or stridor. No respiratory distress. She has no wheezes. She has no rhonchi. She has no rales.  Abdominal: Soft.  Lymphadenopathy:       Head (right side): No tonsillar adenopathy present.        Head (left side): No tonsillar adenopathy present.    She has no cervical adenopathy.  Neurological: She is alert.  Skin: No rash noted. She is not diaphoretic.  Psychiatric: She has a normal mood and affect. Her behavior is normal.    Diagnostics:    Spirometry was performed and demonstrated an FEV1 of 2.05 at 95 % of predicted.  The patient had an Asthma Control Test with the following results:  .    Assessment and Plan:   1. Moderate persistent asthma, with acute exacerbation   2. Allergic rhinoconjunctivitis      1. Treat inflammation:   A. Breo 200 one inhalation 1 time per day  B. montelukast 10 mg one tablet one time per day  C. OTC Rhinocort one spray each nostril once a day  2. Continue ProAir HFA 2 puffs every 4-6 hours if needed  3. Continue OTC antihistamine and Epi-Pen if needed  4. Return to clinic in 4 weeks or earlier if problem  I will start Stephanie [name] on anti-inflammatory medications for her respiratory tract as stated above and we'll see what type of response we get over the course the next 4 weeks. At that point we'll make a determination about how to proceed. She will contact me during the interval should there be a significant problem.  Allena Katz, MD [place]

## 2015-05-12 ENCOUNTER — Other Ambulatory Visit: Payer: Self-pay | Admitting: *Deleted

## 2015-05-12 MED ORDER — GABAPENTIN 300 MG PO CAPS: 300.0000 mg | ORAL_CAPSULE | Freq: Two times a day (BID) | ORAL | Status: DC

## 2015-05-13 ENCOUNTER — Telehealth: Payer: Self-pay | Admitting: Family Medicine

## 2015-05-13 NOTE — Telephone Encounter (Signed)
Patient wanted to update you on her condition. She has been writing down different thing that has been happening since her last visit 12/14. She has appointments next in January with two specialist Dr. Fields-05/26/15 and Dr.Willis-06/13/15.

## 2015-05-20 ENCOUNTER — Encounter: Payer: Self-pay | Admitting: Vascular Surgery

## 2015-05-23 NOTE — Telephone Encounter (Signed)
The patient is having significant problems regarding her pain and discomfort and sciatica it impeded her ability to do much of anything. Having significant pain with movement walking and other associated issues. She has appointments with specialist coming up. Patient unable to work currently because of her condition. The no dictations sees she sent us will be scanned into the system.

## 2015-05-25 ENCOUNTER — Telehealth: Payer: Self-pay | Admitting: Family Medicine

## 2015-05-25 MED ORDER — OXYCODONE-ACETAMINOPHEN 5-325 MG PO TABS: 1.0000 | ORAL_TABLET | ORAL | Status: DC | PRN

## 2015-05-25 NOTE — Telephone Encounter (Signed)
Called patient and informed her per Dr.Scott Luking- May have one refill follow-up office visit within next refill necessary.Informed patient prescription was ready for pick up. Patient verbalized understanding.

## 2015-05-25 NOTE — Telephone Encounter (Signed)
May have one refill follow-up office visit within next refill necessary

## 2015-05-25 NOTE — Telephone Encounter (Signed)
Pt is requesting a refill on her oxycodone. 

## 2015-05-26 ENCOUNTER — Ambulatory Visit (INDEPENDENT_AMBULATORY_CARE_PROVIDER_SITE_OTHER): Payer: BLUE CROSS/BLUE SHIELD | Admitting: Vascular Surgery

## 2015-05-26 ENCOUNTER — Encounter: Payer: Self-pay | Admitting: Vascular Surgery

## 2015-05-26 ENCOUNTER — Ambulatory Visit (HOSPITAL_COMMUNITY)
Admission: RE | Admit: 2015-05-26 | Discharge: 2015-05-26 | Disposition: A | Discharge status: Home / Self Care | Payer: BLUE CROSS/BLUE SHIELD | Source: Ambulatory Visit | Attending: Vascular Surgery | Admitting: Vascular Surgery

## 2015-05-26 VITALS — BP 167/106 | HR 82 | Ht 64.0 in | Wt 184.0 lb

## 2015-05-26 DIAGNOSIS — I83812 Varicose veins of left lower extremities with pain: Secondary | ICD-10-CM | POA: Diagnosis not present

## 2015-05-26 NOTE — Progress Notes (Signed)
[facility] [place] HISTORY AND PHYSICAL   Referring: Self History of Present Illness:  Patient is a 55 y.o. year old female who presents for evaluation of left leg pain thought to be secondary to varicose veins.  The patient experiences pain behind her left knee when standing for long periods of time. She has also recently developed episodes of numbness extending from the knee to the left foot. She has a history of chronic back pain. She had an MRI about 2 months ago. This showed disc disease at L3-4 and 5. She has had primarily right leg symptoms in the past but currently the left leg is a worst. She denies prior history of DVT. She is a nonsmoker. She has no history of diabetes. She has had one injection of her back which made her back pain feels significantly better. She is curious whether or not she might need to apply for disability for her back. She has no family history of varicose veins. She does have a history of hypertension which has been stable.  Past medical history: Hypertension Social History Social History  Substance Use Topics  . Smoking status: Former Smoker -- 0.25 packs/day for 18 years    Types: Cigarettes    Start date: 09/24/1994    Quit date: 03/06/2013  . Smokeless tobacco: Never Used  . Alcohol Use: None    Family History Family History  Problem Relation Age of Onset  . Hypertension Mother   . Hypertension Father     Allergies  Allergies  Allergen Reactions  . Aspirin Swelling  . Gabapentin     drowsy  . Hydrocodone     Itching      Current Outpatient Prescriptions  Medication Sig Dispense Refill  . chlorzoxazone (PARAFON) 500 MG tablet Take 1 tablet (500 mg total) by mouth 3 (three) times daily as needed for muscle spasms. 30 tablet 0  . EPIPEN 2-PAK 0.3 MG/0.3ML SOAJ injection USE AS DIRECTED FOR LIFE THREATENING ALLERGIC REACTIONS  3  . Fluticasone Furoate-Vilanterol (BREO ELLIPTA) 200-25 MCG/INH AEPB Inhale 1 puff into the  lungs daily. 1 each 4  . gabapentin (NEURONTIN) 300 MG capsule Take 1 capsule (300 mg total) by mouth 2 (two) times daily. 60 capsule 1  . lisinopril (PRINIVIL,ZESTRIL) 5 MG tablet   3  . montelukast (SINGULAIR) 10 MG tablet Take 1 tablet (10 mg total) by mouth at bedtime. 30 tablet 5  . oxyCODONE-acetaminophen (PERCOCET/ROXICET) 5-325 MG tablet Take 1 tablet by mouth every 4 (four) hours as needed. 45 tablet 0  . phentermine (ADIPEX-P) 37.5 MG tablet Take 1 tablet (37.5 mg total) by mouth daily before breakfast. 30 tablet 2  . polyethylene glycol-electrolytes (NULYTELY/GOLYTELY) 420 G solution See admin instructions.  0  . pregabalin (LYRICA) 75 MG capsule Take 1 capsule (75 mg total) by mouth 2 (two) times daily. 60 capsule 2  . PROAIR HFA 108 (90 BASE) MCG/ACT inhaler INHALE 2 PUFFS EVERY 4-6 HOURS AS NEEDED FOR COUGH OR WHEEZE.  1  . triamterene-hydrochlorothiazide (DYAZIDE) 37.5-25 MG per capsule      No current facility-administered medications for this visit.    ROS:   General:  No weight loss, Fever, chills  HEENT: No recent headaches, no nasal bleeding, no visual changes, no sore throat  Neurologic: No dizziness, blackouts, seizures. No recent symptoms of stroke or mini- stroke. No recent episodes of slurred speech, or temporary blindness.  Cardiac: She does have a history of a systolic heart murmur since  she was age 70. No recent episodes of chest pain/pressure, no shortness of breath at rest.  No shortness of breath with exertion.  Denies history of atrial fibrillation or irregular heartbeat  Vascular: No history of rest pain in feet.  No history of claudication.  No history of non-healing ulcer, No history of DVT   Pulmonary: No home oxygen, no productive cough, no hemoptysis,  No asthma or wheezing  Musculoskeletal:  [ ]  Arthritis, [x ] Low back pain,  [ ]  Joint pain  Hematologic:No history of hypercoagulable state.  No history of easy bleeding.  No history of  anemia  Gastrointestinal: No hematochezia or melena,  No gastroesophageal reflux, no trouble swallowing  Urinary: [ ]  chronic Kidney disease, [ ]  on HD - [ ]  MWF or [ ]  TTHS, [ ]  Burning with urination, [ ]  Frequent urination, [ ]  Difficulty urinating;   Skin: No rashes  Psychological: No history of anxiety,  No history of depression   Physical Examination  Filed Vitals:   05/26/15 1415 05/26/15 1423  BP: 162/110 167/106  Pulse: 82   Height: 5\' 4"  (1.626 m)   Weight: 184 lb (83.462 kg)   SpO2: 98%     Body mass index is 31.57 kg/(m^2).  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 This showed a small area of reflux in the left greater saphenous vein behind the knee over a short segment but the vein diameter was only 2 mm.   ASSESSMENT:  Left lower extremity pain. History is more consistent with radiculopathy secondary to her back and varicose veins. She has minimal findings on her venous duplex exam. I discussed with the patient possibility of sclerotherapy for the veins behind her left knee if these caused her irritation but I do not believe this is was causing her numbness in her left leg. She has a normal arterial exam so I do not believe her complaints are vascular in nature.   PLAN:  Patient will follow-up on as-needed basis if she wishes sclerotherapy for the reticular veins behind her left  knee. Otherwise she'll continue to follow up with Dr. Gerda Diss regarding her back pain and possible radiculopathy.  Fabienne Bruns, MD [facility] [place] Office: [phone] Pager: [phone]

## 2015-06-02 ENCOUNTER — Encounter: Payer: Self-pay | Admitting: Family Medicine

## 2015-06-02 ENCOUNTER — Ambulatory Visit (INDEPENDENT_AMBULATORY_CARE_PROVIDER_SITE_OTHER): Payer: BLUE CROSS/BLUE SHIELD | Admitting: Family Medicine

## 2015-06-02 VITALS — BP 154/100 | Ht 64.0 in | Wt 182.4 lb

## 2015-06-02 DIAGNOSIS — Z029 Encounter for administrative examinations, unspecified: Secondary | ICD-10-CM

## 2015-06-02 DIAGNOSIS — M5441 Lumbago with sciatica, right side: Secondary | ICD-10-CM

## 2015-06-02 NOTE — Progress Notes (Signed)
   Subjective:    Patient ID: Stephanie [name], female    DOB: 1961-04-07, 55 y.o.   MRN: [id number]  Back Pain This is a chronic problem. The current episode started more than 1 year ago. The pain is present in the lumbar spine. She has tried analgesics and muscle relaxant for the symptoms.   Patient in today for a follow up on back pain. States no other concerns this visit. Patient states currently she is using muscle relaxer in the oxycodone. Patient denies fever chills sweats. Patient unable to work currently. It is not known when she will be able to return to work. Review of Systems  Musculoskeletal: Positive for back pain.   she describes back pain also describes pain radiating down the right leg. Some pain into the left leg.     Objective:   Physical Exam  Lumbar area mild tenderness subjected discomfort radiating both in the right and left leg Patient has difficult time standing up out of a chair have and use her cane to walk around she walks very slowly.      Assessment & Plan:  Patient was warned not to use muscle relaxers on a regular basis. She was told that this increases her risk for accidental overdose.  Oxycodone one half or 1 whole tablet as necessary to help with severe pain caution drowsiness, no driving if feeling drowsy  Progressive back pain with right leg sciatica MRI no herniated disc but awaiting EMG nerve conduction studies by neurology.  Patient relates pain in the back worse she states she can barely get around having to use her cane a lot. I believe it is reasonable for her to be seen by back specialists. We will help set this up. I would recommend orthopedic back specialist

## 2015-06-03 DIAGNOSIS — Z0279 Encounter for issue of other medical certificate: Secondary | ICD-10-CM | POA: Diagnosis not present

## 2015-06-13 ENCOUNTER — Ambulatory Visit (INDEPENDENT_AMBULATORY_CARE_PROVIDER_SITE_OTHER): Payer: BLUE CROSS/BLUE SHIELD | Admitting: Neurology

## 2015-06-13 ENCOUNTER — Ambulatory Visit (INDEPENDENT_AMBULATORY_CARE_PROVIDER_SITE_OTHER): Payer: Self-pay | Admitting: Neurology

## 2015-06-13 ENCOUNTER — Telehealth: Payer: Self-pay | Admitting: Family Medicine

## 2015-06-13 ENCOUNTER — Encounter: Payer: Self-pay | Admitting: Neurology

## 2015-06-13 DIAGNOSIS — M5441 Lumbago with sciatica, right side: Secondary | ICD-10-CM | POA: Diagnosis not present

## 2015-06-13 NOTE — Telephone Encounter (Signed)
Nurse's-her nerve conduction study does show some evidence of this originating from her back but could not specify the level. Still important for the patient to see a specialist. Referral was put in on January 19. I do not seen any evidence regarding the actual appointment. Please confer with Stephanie [name]. Then communicate with the patient. Let's make sure the patient does follow-up with specialists.

## 2015-06-13 NOTE — Procedures (Signed)
     HISTORY:  Stephanie [name] is a 55 year old patient with a history of some low back pain and right leg discomfort since May 2016. More recently, there has been some discomfort in the left leg as well from the knee down to the foot. The patient reports discomfort on the right side all the way from the hip and low back area to the foot. She does report some weakness in the legs. She is being evaluated for these issues. MRI of the low back did not show any structural etiology of the current complaints.  NERVE CONDUCTION STUDIES:  Nerve conduction studies were performed on both lower extremities. The distal motor latencies and motor amplitudes for the peroneal and posterior tibial nerves were within normal limits. The nerve conduction velocities for these nerves were also normal. The H reflex latencies were normal. The sensory latencies for the peroneal nerves were within normal limits.   EMG STUDIES:  EMG study was performed on the right lower extremity:  The tibialis anterior muscle reveals 2 to 4K motor units with full recruitment. No fibrillations or positive waves were seen. The peroneus tertius muscle reveals 2 to 4K motor units with full recruitment. No fibrillations or positive waves were seen. The medial gastrocnemius muscle reveals 1 to 3K motor units with full recruitment. No fibrillations or positive waves were seen. The vastus lateralis muscle reveals 2 to 4K motor units with full recruitment. No fibrillations or positive waves were seen. The iliopsoas muscle reveals 2 to 4K motor units with full recruitment. No fibrillations or positive waves were seen. The biceps femoris muscle (long head) reveals 2 to 4K motor units with full recruitment. No fibrillations or positive waves were seen. The lumbosacral paraspinal muscles were tested at 3 levels, and revealed no abnormalities of insertional activity at the upper and middle levels tested.  2+ positive waves seen at the lower level.  There was good relaxation.  EMG study was performed on the left lower extremity:  The tibialis anterior muscle reveals 2 to 4K motor units with full recruitment. No fibrillations or positive waves were seen. The peroneus tertius muscle reveals 2 to 4K motor units with full recruitment. No fibrillations or positive waves were seen. The medial gastrocnemius muscle reveals 1 to 3K motor units with full recruitment. No fibrillations or positive waves were seen. The vastus lateralis muscle reveals 2 to 4K motor units with full recruitment. No fibrillations or positive waves were seen. The iliopsoas muscle reveals 2 to 4K motor units with full recruitment. No fibrillations or positive waves were seen. The biceps femoris muscle (long head) reveals 2 to 4K motor units with full recruitment. No fibrillations or positive waves were seen. The lumbosacral paraspinal muscles were tested at 3 levels, and revealed no abnormalities of insertional activity at the upper and middle levels tested.  1+ positive waves were seen at the lower level. There was good relaxation.   IMPRESSION:  Nerve conduction studies done on both lower extremities were within normal limits. No evidence of a peripheral neuropathy is seen. EMG evaluations of both lower extremities were relatively unremarkable, but there appears to be evidence of mild acute denervation in the lower lumbosacral paraspinal muscles bilaterally, right greater than left. The clinical significance of this is not clear, could represent a low-grade lumbosacral radiculopathy, level is indeterminate by this study.  Marlan Palau MD 06/13/2015 11:54 AM  [facility] [address] [place], [place] [address]  Phone [phone] Fax [phone]

## 2015-06-13 NOTE — Telephone Encounter (Signed)
Enid Derry, do you know the status of patient's appointment?

## 2015-06-13 NOTE — Progress Notes (Signed)
Please refer to EMG and NCV procedure note. 

## 2015-06-14 ENCOUNTER — Ambulatory Visit (INDEPENDENT_AMBULATORY_CARE_PROVIDER_SITE_OTHER): Payer: BLUE CROSS/BLUE SHIELD | Admitting: Allergy and Immunology

## 2015-06-14 ENCOUNTER — Encounter: Payer: Self-pay | Admitting: Allergy and Immunology

## 2015-06-14 VITALS — BP 132/98 | HR 72 | Resp 16

## 2015-06-14 DIAGNOSIS — J309 Allergic rhinitis, unspecified: Secondary | ICD-10-CM | POA: Diagnosis not present

## 2015-06-14 DIAGNOSIS — H1010 Acute atopic conjunctivitis, unspecified eye: Secondary | ICD-10-CM | POA: Diagnosis not present

## 2015-06-14 DIAGNOSIS — Z886 Allergy status to analgesic agent status: Secondary | ICD-10-CM

## 2015-06-14 DIAGNOSIS — J4540 Moderate persistent asthma, uncomplicated: Secondary | ICD-10-CM

## 2015-06-14 NOTE — Patient Instructions (Signed)
  1. Treat inflammation:   A. Breo 200 one inhalation 1 time per day  B. montelukast 10 mg one tablet one time per day  C. OTC Rhinocort one spray each nostril once a day  2. Continue ProAir HFA 2 puffs every 4-6 hours if needed  3. Continue OTC antihistamine and Epi-Pen if needed  4. Return to clinic in 12 weeks or earlier if problem

## 2015-06-14 NOTE — Progress Notes (Signed)
[facility]  Follow-up Note  Referring Provider: Babs Sciara, MD Primary Provider: Lilyan Punt, MD Date of Office Visit: 06/14/2015  Subjective:   Stephanie [name] is a 55 y.o. female who [facility] on 06/14/2015 in re-evaluation of the following:  HPI Comments: Stephanie [name] returns to this clinic in reevaluation of her asthma. Since initiating medical therapy directed against inflammation of her respiratory tract she is done much better regarding both her upper and lower respiratory tract. She has not required any bronchodilator. She cannot really exert herself secondary to her back issue with radiculopathy. Her sleep is not disturbed by her asthma. Her nose is not really been causing her any problem.   Current Outpatient Prescriptions on File Prior to Visit  Medication Sig Dispense Refill  . chlorzoxazone (PARAFON) 500 MG tablet Take 1 tablet (500 mg total) by mouth 3 (three) times daily as needed for muscle spasms. 30 tablet 0  . EPIPEN 2-PAK 0.3 MG/0.3ML SOAJ injection USE AS DIRECTED FOR LIFE THREATENING ALLERGIC REACTIONS  3  . Fluticasone Furoate-Vilanterol (BREO ELLIPTA) 200-25 MCG/INH AEPB Inhale 1 puff into the lungs daily. 1 each 4  . lisinopril (PRINIVIL,ZESTRIL) 5 MG tablet   3  . montelukast (SINGULAIR) 10 MG tablet Take 1 tablet (10 mg total) by mouth at bedtime. 30 tablet 5  . oxyCODONE-acetaminophen (PERCOCET/ROXICET) 5-325 MG tablet Take 1 tablet by mouth every 4 (four) hours as needed. 45 tablet 0  . pregabalin (LYRICA) 75 MG capsule Take 1 capsule (75 mg total) by mouth 2 (two) times daily. 60 capsule 2  . PROAIR HFA 108 (90 BASE) MCG/ACT inhaler INHALE 2 PUFFS EVERY 4-6 HOURS AS NEEDED FOR COUGH OR WHEEZE.  1  . polyethylene glycol-electrolytes (NULYTELY/GOLYTELY) 420 G solution See admin instructions. Reported on 06/14/2015  0   No current facility-administered medications on file  prior to visit.   History reviewed. No pertinent past medical history.  History reviewed. No pertinent past surgical history.  Allergies  Allergen Reactions  . Aspirin Swelling  . Gabapentin     drowsy  . Hydrocodone     Itching     Review of systems negative except as noted in HPI / PMHx or noted below:  Review of Systems  Constitutional: Negative.   HENT: Negative.   Eyes: Negative.   Respiratory: Negative.   Cardiovascular: Negative.   Gastrointestinal: Negative.   Genitourinary: Negative.   Musculoskeletal: Positive for back pain (Visited with Dr. Lesia Sago recently).  Skin: Negative.   Neurological: Negative.   Endo/Heme/Allergies: Negative.   Psychiatric/Behavioral: Negative.      Objective:   Filed Vitals:   06/14/15 1034  BP: 132/98  Pulse: 72  Resp: 16          Physical Exam  Constitutional: She is well-developed, well-nourished, and in no distress.  HENT:  Head: Normocephalic.  Right Ear: Tympanic membrane, external ear and ear canal normal.  Left Ear: Tympanic membrane, external ear and ear canal normal.  Nose: Nose normal. No mucosal edema or rhinorrhea.  Mouth/Throat: Uvula is midline, oropharynx is clear and moist and mucous membranes are normal. No oropharyngeal exudate.  Eyes: Conjunctivae are normal.  Neck: Trachea normal. No tracheal tenderness present. No tracheal deviation present. No thyromegaly present.  Cardiovascular: Normal rate, regular rhythm, S1 normal, S2 normal and normal heart sounds.   No murmur heard. Pulmonary/Chest: Breath sounds normal. No stridor. No respiratory distress. She has  no wheezes. She has no rales.  Musculoskeletal: She exhibits no edema.  Very slow and deliberate movements of back and lower extremities secondary to pain  Lymphadenopathy:       Head (right side): No tonsillar adenopathy present.       Head (left side): No tonsillar adenopathy present.    She has no cervical adenopathy.    She has no  axillary adenopathy.  Neurological: She is alert.  Skin: No rash noted. She is not diaphoretic. No erythema. Nails show no clubbing.  Psychiatric: Mood and affect normal.    Diagnostics:    Spirometry was performed and demonstrated an FEV1 of 1.87 at 86 % of predicted.  The patient had an Asthma Control Test with the following results:  .    Assessment and Plan:   1. Asthma, moderate persistent, well-controlled   2. Allergic rhinoconjunctivitis   3. Allergy to NSAIDs     1. Treat inflammation:   A. Breo 200 one inhalation 1 time per day  B. montelukast 10 mg one tablet one time per day  C. OTC Rhinocort one spray each nostril once a day  2. Continue ProAir HFA 2 puffs every 4-6 hours if needed  3. Continue OTC antihistamine and Epi-Pen if needed  4. Return to clinic in 12 weeks or earlier if problem  Stephanie [name] is doing quite well from a respiratory standpoint and we will continue to have her use the medication specified above and regroup with her in 12 weeks or earlier if there is a problem. She is quite aware that she can contact this clinic should she develop a significant problem as we move forward. Her back issue is certainly a big issue and I don't really think that she will have a very high quality of life with the extensive limitation in movement that she demonstrated during today's visit. I've encouraged her to undergo further evaluation with Dr. Lesia Sago, consider rehabilitation, and undergo weight loss in an attempt to help with her back issue.  Stephanie Schimke, MD [facility]

## 2015-06-15 ENCOUNTER — Other Ambulatory Visit: Payer: Self-pay | Admitting: Family Medicine

## 2015-06-15 NOTE — Telephone Encounter (Signed)
May have refill keep follow-up visit in several weeks

## 2015-06-15 NOTE — Telephone Encounter (Signed)
Refill on oxycodone 5/325 mg

## 2015-06-16 MED ORDER — OXYCODONE-ACETAMINOPHEN 5-325 MG PO TABS: 1.0000 | ORAL_TABLET | ORAL | Status: DC | PRN

## 2015-06-16 NOTE — Telephone Encounter (Signed)
Patient notified script ready for pickup. Keep follow up appt.

## 2015-06-21 NOTE — Telephone Encounter (Signed)
Pt was scheduled to see Dr. Ophelia Charter on 06/16/15

## 2015-06-23 ENCOUNTER — Ambulatory Visit: Payer: BLUE CROSS/BLUE SHIELD | Admitting: Family Medicine

## 2015-06-23 DIAGNOSIS — Z029 Encounter for administrative examinations, unspecified: Secondary | ICD-10-CM

## 2015-06-24 ENCOUNTER — Encounter: Payer: Self-pay | Admitting: Family Medicine

## 2015-06-24 ENCOUNTER — Ambulatory Visit (INDEPENDENT_AMBULATORY_CARE_PROVIDER_SITE_OTHER): Payer: BLUE CROSS/BLUE SHIELD | Admitting: Family Medicine

## 2015-06-24 VITALS — BP 120/76 | Ht 64.0 in | Wt 178.4 lb

## 2015-06-24 DIAGNOSIS — M160 Bilateral primary osteoarthritis of hip: Secondary | ICD-10-CM | POA: Diagnosis not present

## 2015-06-24 MED ORDER — OXYCODONE-ACETAMINOPHEN 5-325 MG PO TABS: 1.0000 | ORAL_TABLET | ORAL | Status: DC | PRN

## 2015-06-24 NOTE — Progress Notes (Signed)
   Subjective:    Patient ID: Stephanie [name], female    DOB: 09/08/1960, 55 y.o.   MRN: [id number]  Back Pain This is a chronic problem. The current episode started more than 1 year ago. The problem occurs intermittently. The problem is unchanged. The quality of the pain is described as aching. The pain is moderate. The pain is the same all the time. Stiffness is present all day. Treatments tried: oxycodone. The treatment provided mild relief.  Patient wants recent xray results that was done in [place].   Patient has no other concerns at this time. She states because of the pain she is having she's not able to work     Review of Systems  Musculoskeletal: Positive for back pain.   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 She uses pain medication no more than 2 per day she denies abusing it. She is not a drug seeker. Patient unable to stand for any significant length of time because of hip pain it is difficult for her to set for any significant length of time without having to change position plus also when she walks she has to use a cane to get around. 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

## 2015-07-03 ENCOUNTER — Encounter: Payer: Self-pay | Admitting: Family Medicine

## 2015-07-03 DIAGNOSIS — M160 Bilateral primary osteoarthritis of hip: Secondary | ICD-10-CM | POA: Insufficient documentation

## 2015-07-05 ENCOUNTER — Ambulatory Visit: Payer: BLUE CROSS/BLUE SHIELD | Admitting: Family Medicine

## 2015-07-08 ENCOUNTER — Encounter: Payer: Self-pay | Admitting: Family Medicine

## 2015-07-08 ENCOUNTER — Ambulatory Visit (INDEPENDENT_AMBULATORY_CARE_PROVIDER_SITE_OTHER): Payer: BLUE CROSS/BLUE SHIELD | Admitting: Family Medicine

## 2015-07-08 VITALS — BP 138/90 | Temp 98.3°F | Ht 64.0 in | Wt 178.4 lb

## 2015-07-08 DIAGNOSIS — J329 Chronic sinusitis, unspecified: Secondary | ICD-10-CM | POA: Diagnosis not present

## 2015-07-08 DIAGNOSIS — J310 Chronic rhinitis: Secondary | ICD-10-CM

## 2015-07-08 MED ORDER — FLUCONAZOLE 150 MG PO TABS: ORAL_TABLET | ORAL | Status: DC

## 2015-07-08 MED ORDER — AMOXICILLIN-POT CLAVULANATE 875-125 MG PO TABS: 1.0000 | ORAL_TABLET | Freq: Two times a day (BID) | ORAL | Status: AC

## 2015-07-08 MED ORDER — FLUCONAZOLE 150 MG PO TABS: 150.0000 mg | ORAL_TABLET | Freq: Once | ORAL | Status: DC

## 2015-07-08 NOTE — Progress Notes (Signed)
   Subjective:    Patient ID: Stephanie [name], female    DOB: 05/03/1961, 55 y.o.   MRN: [id number]  URI  This is a new problem. The current episode started in the past 7 days. There has been no fever. Associated symptoms include congestion, rhinorrhea and a sore throat. Associated symptoms comments: Fatigue. She has tried increased fluids (Rest) for the symptoms. The treatment provided mild relief.   Patient states no other concerns this visit.  Felt really bad yesterday  Pos flu shot for pt,   sleepy tired achey  No energy no appetitie  No sig headache no muscle ache  ernergy zero  Non smoker    Review of Systems  HENT: Positive for congestion, rhinorrhea and sore throat.        Objective:   Physical Exam  Alert moderate malaise HET moderate his congestion some frontal tenderness pharynx normal some drainage neck supple lungs clear heart regular in rhythm.      Assessment & Plan:  Impression acute rhinosinusitis plan antibiotics prescribed. Symptom care discussed warning signs discussed WSL

## 2015-07-12 ENCOUNTER — Telehealth: Payer: Self-pay | Admitting: Family Medicine

## 2015-07-12 MED ORDER — OXYCODONE-ACETAMINOPHEN 5-325 MG PO TABS: 1.0000 | ORAL_TABLET | ORAL | Status: DC | PRN

## 2015-07-12 NOTE — Telephone Encounter (Signed)
Patient requesting Rx for oxyCODONE-acetaminophen (PERCOCET/ROXICET) 5-325 MG tablet.  She is trying to fill this early, because her insurance expires tomorrow.

## 2015-07-12 NOTE — Telephone Encounter (Signed)
May have refill

## 2015-07-12 NOTE — Telephone Encounter (Signed)
Last filled 06/24/15 for # 45

## 2015-07-12 NOTE — Telephone Encounter (Signed)
Rx up front for patient pick up. Patient notified. 

## 2015-07-20 DIAGNOSIS — Z029 Encounter for administrative examinations, unspecified: Secondary | ICD-10-CM

## 2015-08-05 ENCOUNTER — Ambulatory Visit: Payer: BLUE CROSS/BLUE SHIELD | Admitting: Family Medicine

## 2015-08-08 ENCOUNTER — Telehealth: Payer: Self-pay | Admitting: Family Medicine

## 2015-08-08 MED ORDER — OXYCODONE-ACETAMINOPHEN 5-325 MG PO TABS: 1.0000 | ORAL_TABLET | ORAL | Status: DC | PRN

## 2015-08-08 NOTE — Telephone Encounter (Signed)
Patient may have a refill on the pain medication-next visit for pain management would be early May 2017-hopefully this prescription will last her until then but if she needs additional she is to let us know.

## 2015-08-08 NOTE — Telephone Encounter (Signed)
oxyCODONE-acetaminophen (PERCOCET/ROXICET) 5-325 MG tablet   Pt is wanting a refill on this med please

## 2015-08-08 NOTE — Telephone Encounter (Signed)
last filled 07/12/15 for # 45

## 2015-08-08 NOTE — Telephone Encounter (Signed)
Patient notified script ready for pick up

## 2015-09-05 ENCOUNTER — Other Ambulatory Visit: Payer: Self-pay | Admitting: *Deleted

## 2015-09-05 MED ORDER — PROAIR HFA 108 (90 BASE) MCG/ACT IN AERS: 2.0000 | INHALATION_SPRAY | Freq: Four times a day (QID) | RESPIRATORY_TRACT | Status: DC | PRN

## 2015-09-19 ENCOUNTER — Encounter: Payer: Self-pay | Admitting: Family Medicine

## 2015-09-19 ENCOUNTER — Ambulatory Visit (INDEPENDENT_AMBULATORY_CARE_PROVIDER_SITE_OTHER): Payer: BLUE CROSS/BLUE SHIELD | Admitting: Family Medicine

## 2015-09-19 VITALS — BP 138/86 | Ht 64.0 in | Wt 174.0 lb

## 2015-09-19 DIAGNOSIS — Z79899 Other long term (current) drug therapy: Secondary | ICD-10-CM

## 2015-09-19 DIAGNOSIS — Z13220 Encounter for screening for lipoid disorders: Secondary | ICD-10-CM

## 2015-09-19 DIAGNOSIS — I10 Essential (primary) hypertension: Secondary | ICD-10-CM

## 2015-09-19 DIAGNOSIS — M5441 Lumbago with sciatica, right side: Secondary | ICD-10-CM | POA: Diagnosis not present

## 2015-09-19 DIAGNOSIS — M160 Bilateral primary osteoarthritis of hip: Secondary | ICD-10-CM | POA: Diagnosis not present

## 2015-09-19 MED ORDER — OXYCODONE-ACETAMINOPHEN 5-325 MG PO TABS: ORAL_TABLET | ORAL | Status: DC

## 2015-09-19 MED ORDER — PREGABALIN 50 MG PO CAPS: 50.0000 mg | ORAL_CAPSULE | Freq: Two times a day (BID) | ORAL | Status: DC

## 2015-09-19 NOTE — Progress Notes (Signed)
Subjective:    Patient ID: Stephanie [name], female    DOB: 02-Nov-1960, 55 y.o.   MRN: [id number]  HPI This patient was seen today for chronic pain  The medication list was reviewed and updated.   -Compliance with medication: Takes daily as prescribed  - Number patient states they take daily: Patient takes 2 tablets daily.  -when was the last dose patient took? Last dose taken this morning  The patient was advised the importance of maintaining medication and not using illegal substances with these.  Refills needed: Yes  The patient was educated that we can provide 3 monthly scripts for their medication, it is their responsibility to follow the instructions.  Side effects or complications from medications: Patient states medication makes her drowsy.  Patient is aware that pain medications are meant to minimize the severity of the pain to allow their pain levels to improve to allow for better function. They are aware of that pain medications cannot totally remove their pain.  Due for UDT ( at least once per year) : N/A  Patient has concerns of Lyrica making her drowsy and states pain in lower back pain is worsening.        Review of Systems  Constitutional: Positive for fatigue. Negative for fever.  HENT: Negative for congestion.   Respiratory: Negative for cough and shortness of breath.   Cardiovascular: Negative for chest pain.  Gastrointestinal: Negative for abdominal pain.  Musculoskeletal: Positive for back pain, arthralgias and gait problem.  Neurological: Positive for dizziness.   25 minutes was spent with the patient. Greater than half the time was spent in discussion and answering questions and counseling regarding the issues that the patient came in for today.     Objective:   Physical Exam  Constitutional: She appears well-developed.  HENT:  Head: Normocephalic.  Cardiovascular: Normal rate, regular rhythm and normal heart sounds.   Pulmonary/Chest: Effort  normal. No respiratory distress. She has no wheezes.   Patient was subjective discomfort in her low back and also with discomfort with movement of both hips. Right versus left is worse patient walks with a cane.       Assessment & Plan:  Low back pain-patient relates Lyrica cause drowsiness. Reduce the dose 50 mg twice daily. Continue pain medication. No greater than 2 per day. 3 prescriptions were given. Patient relates pain causes her to have a shift positions frequently between sitting and standing she cannot be in one position long often has to lay down get some relief of pain.  Osteoarthritis of the hip 1 orthopedic surgeon recommended surgery patient states that her young age she is hesitant to have surgery because she is not certain how well the surgery will hold up over the years the patient is asking for a second opinion  Patient is due for lipid liver profile. Low-fat diet recommended previous lab work reviewed  Because of blood pressure medications check metabolic 7 is well  Currently the patient is disabled from work. I do not believe she would be able to hold a regular job definitely could not work standard shifts. Also I do not believe there is any job currently at the patient can do.  Patient having short-term memory issues I do not believe this is Alzheimer's but if her symptoms worsen we will have to send her to neurology patient currently is not driving. She states she also does not do the cooking, she's for her 4 she will leave the stove on.  In my opinion if the patient does do hip surgery it will not totally correct her back pain. She will still have significant back discomfort.

## 2015-09-21 ENCOUNTER — Encounter: Payer: Self-pay | Admitting: Family Medicine

## 2015-09-29 DIAGNOSIS — Z029 Encounter for administrative examinations, unspecified: Secondary | ICD-10-CM

## 2015-10-07 DIAGNOSIS — Z029 Encounter for administrative examinations, unspecified: Secondary | ICD-10-CM

## 2015-12-13 ENCOUNTER — Ambulatory Visit: Payer: BLUE CROSS/BLUE SHIELD | Admitting: Allergy and Immunology

## 2015-12-20 ENCOUNTER — Ambulatory Visit (INDEPENDENT_AMBULATORY_CARE_PROVIDER_SITE_OTHER): Payer: Self-pay | Admitting: Family Medicine

## 2015-12-20 ENCOUNTER — Encounter: Payer: Self-pay | Admitting: Family Medicine

## 2015-12-20 VITALS — BP 138/88 | Ht 64.0 in | Wt 165.0 lb

## 2015-12-20 DIAGNOSIS — Z79891 Long term (current) use of opiate analgesic: Secondary | ICD-10-CM

## 2015-12-20 DIAGNOSIS — M160 Bilateral primary osteoarthritis of hip: Secondary | ICD-10-CM

## 2015-12-20 DIAGNOSIS — I10 Essential (primary) hypertension: Secondary | ICD-10-CM

## 2015-12-20 MED ORDER — LISINOPRIL-HYDROCHLOROTHIAZIDE 10-12.5 MG PO TABS: 1.0000 | ORAL_TABLET | Freq: Every day | ORAL | 5 refills | Status: DC

## 2015-12-20 NOTE — Progress Notes (Signed)
   Subjective:    Patient ID: Stephanie [name], female    DOB: [birth year], 55 y.o.   MRN: [id number]  HPI This patient was seen today for chronic pain  The medication list was reviewed and updated.   -Compliance with medication: yes  - Number patient states they take daily: 1-2 a day  -when was the last dose patient took? Last pill sunday  The patient was advised the importance of maintaining medication and not using illegal substances with these.  Refills needed: yes  The patient was educated that we can provide 3 monthly scripts for their medication, it is their responsibility to follow the instructions.  Side effects or complications from medications: none  Patient is aware that pain medications are meant to minimize the severity of the pain to allow their pain levels to improve to allow for better function. They are aware of that pain medications cannot totally remove their pain.  Due for UDT ( at least once per year) : today        Review of Systems  Constitutional: Negative for activity change, fatigue and fever.  Respiratory: Negative for cough and shortness of breath.   Cardiovascular: Negative for chest pain and leg swelling.  Neurological: Negative for headaches.       Objective:   Physical Exam  Constitutional: She appears well-nourished. No distress.  Cardiovascular: Normal rate, regular rhythm and normal heart sounds.   No murmur heard. Pulmonary/Chest: Effort normal and breath sounds normal. No respiratory distress.  Musculoskeletal: She exhibits no edema.  Lymphadenopathy:    She has no cervical adenopathy.  Neurological: She is alert. She exhibits normal muscle tone.  Psychiatric: Her behavior is normal.  Vitals reviewed.         Assessment & Plan:  This patient still has significant low back pain and sciatica down the right leg she is making a conscious decision to try to minimize taking pain medicines. She is concerned she could've been all  pain medicine. Therefore she is using mainly Tylenol. In addition to this she is limiting her activity in her medication to try to cut down the aggravation in her hips and back her sciatic nerve. This patient is still disabled. There is no way she could work her regular job. She would best way to many days because of back pain and sciatica and hip pain. As she gets older she may well end up needing hip replacement. I support her claim of disability.  Patient's blood pressure not under good control we used a combination medicine of lisinopril HCTZ she will follow-up within 3 months time. Lab work will be needed on a follow-up visit  I do not feel that this patient needs a urine drug screen she will use the medicine she has left if she needs new prescription she will call us and we will recommend a lower amount.

## 2016-01-10 ENCOUNTER — Ambulatory Visit: Payer: Self-pay | Admitting: Allergy and Immunology

## 2016-01-17 ENCOUNTER — Telehealth: Payer: Self-pay | Admitting: Family Medicine

## 2016-01-17 MED ORDER — OXYCODONE-ACETAMINOPHEN 5-325 MG PO TABS: ORAL_TABLET | ORAL | 0 refills | Status: DC

## 2016-01-17 NOTE — Telephone Encounter (Signed)
Requesting Rx for oxyCODONE-acetaminophen (PERCOCET/ROXICET) 5-325 MG tablet °

## 2016-01-17 NOTE — Telephone Encounter (Signed)
Patient has done a good job of trying to cut back on her narcotic use. I would recommend that she try hydrocodone 5 mg/325 #30, one every 6 hours when necessary pain use sparingly. I would like patient try to see how this does for her. Should this work well I could refill in the future she would be due a pain management visit in November. If this is not working well she will need to let us know.

## 2016-01-17 NOTE — Telephone Encounter (Signed)
Patient is allergic to hydrocodone-causes itching(see allergy list)

## 2016-01-17 NOTE — Telephone Encounter (Signed)
Prescription upfront for pick up. Patient notified. 

## 2016-01-17 NOTE — Telephone Encounter (Signed)
Last seen 12/20/15. Note states pt will use med she has and will call back to get a lower dose

## 2016-01-17 NOTE — Telephone Encounter (Signed)
Patient brought forth insurance form regarding disability to work. Due to severe arthritis in her hips as well as degenerative back disease the patient is disabled from her job. Form was completed. May forward form to the patient.

## 2016-01-17 NOTE — Telephone Encounter (Signed)
She may have oxycodone 5 mg/325 mg, #30, one twice a day when necessary use sparingly

## 2016-01-20 ENCOUNTER — Telehealth: Payer: Self-pay | Admitting: Family Medicine

## 2016-01-20 NOTE — Telephone Encounter (Signed)
Patient's daughter brought in form to be filled out. I reviewed it and it seem like she will need appointment for this. Please review and let me know. Form in your folder.

## 2016-01-22 ENCOUNTER — Encounter: Payer: Self-pay | Admitting: Family Medicine

## 2016-01-22 NOTE — Telephone Encounter (Signed)
Form was filled out letter dictated please send both

## 2016-01-30 ENCOUNTER — Telehealth: Payer: Self-pay | Admitting: Family Medicine

## 2016-01-30 NOTE — Telephone Encounter (Signed)
The patient currently is filing for complete disability. She relates she is unable to do any type of work. The patient is going through disability process currently. I do not feel the patient would be able to hold a job given her current condition.

## 2016-01-30 NOTE — Telephone Encounter (Signed)
Can leave on voicemail, as it is confidential.

## 2016-01-30 NOTE — Telephone Encounter (Signed)
LMOM

## 2016-01-30 NOTE — Telephone Encounter (Signed)
Calling regarding paper work completed for patient.  Would like to know if she is capable of working part time, light duty or sit down work?

## 2016-02-07 ENCOUNTER — Encounter: Payer: Self-pay | Admitting: Allergy and Immunology

## 2016-02-07 ENCOUNTER — Encounter (INDEPENDENT_AMBULATORY_CARE_PROVIDER_SITE_OTHER): Payer: Self-pay

## 2016-02-07 ENCOUNTER — Ambulatory Visit (INDEPENDENT_AMBULATORY_CARE_PROVIDER_SITE_OTHER): Payer: BLUE CROSS/BLUE SHIELD | Admitting: Allergy and Immunology

## 2016-02-07 VITALS — BP 160/108 | HR 80 | Resp 16

## 2016-02-07 DIAGNOSIS — Z886 Allergy status to analgesic agent status: Secondary | ICD-10-CM

## 2016-02-07 DIAGNOSIS — J4540 Moderate persistent asthma, uncomplicated: Secondary | ICD-10-CM

## 2016-02-07 DIAGNOSIS — J309 Allergic rhinitis, unspecified: Secondary | ICD-10-CM

## 2016-02-07 DIAGNOSIS — H1010 Acute atopic conjunctivitis, unspecified eye: Secondary | ICD-10-CM

## 2016-02-07 NOTE — Patient Instructions (Addendum)
  1. Treat inflammation:   A. Can restart Breo 200 one inhalation 1 time per day (clinic samples)  B. montelukast 10 mg one tablet one time per day  C. OTC Rhinocort one spray each nostril once a day  2. Continue ProAir HFA 2 puffs every 4-6 hours if needed  3. Continue OTC antihistamine and Epi-Pen if needed  4. Return to clinic in 6 months or earlier if problem  5. Obtain fall flu vaccine  6. NO NSAID to treat pain.

## 2016-02-07 NOTE — Progress Notes (Signed)
Follow-up Note  Referring Provider: Babs SciaraLuking, Scott A, MD Primary Provider: Lilyan PuntScott Luking, MD Date of Office Visit: 02/07/2016  Subjective:   Stephanie [name] (DOB: 21-Apr-1961) is a 55 y.o. 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 I last saw her in his clinic in January 2017.  Because of an insurance issue she had to discontinue her Virgel BouquetBreo a month or 2 ago. This medication has resulted in excellent control of her asthma but even without Breo over the course the past month or so she's really done very well and has not had use a short acting bronchodilator and has not required a systemic steroid to treat an exacerbation.  Her nose has been doing wonderful while using montelukast and Rhinocort. She's had no need to use an antibiotic to treat an episode of sinusitis.  [name] has been having a tremendous amount of problems with her lower back and her hips and she is seeking out counseling with various orthopedic surgeons about her options for this issue.    Medication List      CALCIUM PO Take by mouth daily.   chlorzoxazone 500 MG tablet Commonly known as:  PARAFON Take 1 tablet (500 mg total) by mouth 3 (three) times daily as needed for muscle spasms.   EPIPEN 2-PAK 0.3 mg/0.3 mL Soaj injection Generic drug:  EPINEPHrine USE AS DIRECTED FOR LIFE THREATENING ALLERGIC REACTIONS   montelukast 10 MG tablet Commonly known as:  SINGULAIR Take 1 tablet (10 mg total) by mouth at bedtime.   multivitamin tablet Take 1 tablet by mouth daily.   oxyCODONE-acetaminophen 5-325 MG tablet Commonly known as:  PERCOCET/ROXICET Take 1/2 to 1 tablet by mouth twice a day as needed for pain   PROAIR HFA 108 (90 Base) MCG/ACT inhaler Generic drug:  albuterol Inhale 2 puffs into the  lungs every 6 (six) hours as needed for wheezing or shortness of breath.   RHINOCORT ALLERGY 32 MCG/ACT nasal spray Generic drug:  budesonide Place 1 spray into both nostrils daily.   triamterene-hydrochlorothiazide 37.5-25 MG tablet Commonly known as:  MAXZIDE-25 Take by mouth.   VITAMIN E PO Take by mouth daily.       History reviewed. No pertinent past medical history.  History reviewed. No pertinent surgical history.  Allergies  Allergen Reactions  . Aspirin Swelling  . Gabapentin     drowsy  . Hydrocodone     Itching     Review of systems negative except as noted in HPI / PMHx or noted below:  Review of Systems  Constitutional: Negative.   HENT: Negative.   Eyes: Negative.   Respiratory: Negative.   Cardiovascular: Negative.   Gastrointestinal: Negative.   Genitourinary: Negative.   Musculoskeletal: Negative.   Skin: Negative.   Neurological: Negative.   Endo/Heme/Allergies: Negative.   Psychiatric/Behavioral: Negative.      Objective:   Vitals:   02/07/16 1726  BP: (!) 160/108  Pulse: 80  Resp: 16          Physical Exam  Constitutional: She is well-developed, well-nourished, and in no distress.  HENT:  Head: Normocephalic.  Right Ear: Tympanic membrane, external ear and ear canal normal.  Left Ear: Tympanic membrane, external ear and ear canal normal.  Nose: Nose normal. No mucosal edema or rhinorrhea.  Mouth/Throat: Uvula is midline, oropharynx is  clear and moist and mucous membranes are normal. No oropharyngeal exudate.  Eyes: Conjunctivae are normal.  Neck: Trachea normal. No tracheal tenderness present. No tracheal deviation present. No thyromegaly present.  Cardiovascular: Normal rate, regular rhythm, S1 normal, S2 normal and normal heart sounds.   No murmur heard. Pulmonary/Chest: Breath sounds normal. No stridor. No respiratory distress. She has no wheezes. She has no rales.  Musculoskeletal: She exhibits no edema.    Lymphadenopathy:       Head (right side): No tonsillar adenopathy present.       Head (left side): No tonsillar adenopathy present.    She has no cervical adenopathy.  Neurological: She is alert. Gait normal.  Skin: No rash noted. She is not diaphoretic. No erythema. Nails show no clubbing.  Psychiatric: Mood and affect normal.    Diagnostics:    Spirometry was performed and demonstrated an FEV1 of 1.65 at 73 % of predicted.   Assessment and Plan:   1. Asthma, moderate persistent, well-controlled   2. Allergic rhinoconjunctivitis   3. Allergy to NSAIDs     1. Treat inflammation:   A. Can restart Breo 200 one inhalation 1 time per day (clinic samples)  B. montelukast 10 mg one tablet one time per day  C. OTC Rhinocort one spray each nostril once a day  2. Continue ProAir HFA 2 puffs every 4-6 hours if needed  3. Continue OTC antihistamine and Epi-Pen if needed  4. Return to clinic in 6 months or earlier if problem  5. Obtain fall flu vaccine  6. NO NSAID to treat pain.  I've given Meia some samples of Breo today and I've asked her to consistently use this medication and she can continue on a leukotriene modifier and a nasal steroid to address her upper airway atopic disease. She has no insurance at this point in time and I've asked her to look into the free program from the manufacturer of these medications. I will see her back in this clinic in 6 months or earlier if there is a problem.  Laurette Schimke, MD [facility]

## 2016-03-06 ENCOUNTER — Other Ambulatory Visit: Payer: Self-pay | Admitting: Allergy and Immunology

## 2016-03-19 ENCOUNTER — Telehealth: Payer: Self-pay | Admitting: Family Medicine

## 2016-03-19 NOTE — Telephone Encounter (Signed)
Patient said that her blood pressure medication was switched to something new around September.  She doesn't remember the name of it, but she said she misplaced the Rx and wants to know if [facility] can [facility].

## 2016-03-19 NOTE — Telephone Encounter (Signed)
Don't see where blood pressure med was prescribed in September according to med list

## 2016-03-19 NOTE — Telephone Encounter (Signed)
Patient states that you wrote her a prescription. So the pharmacy doesn't know because the patient lost the script before they could it. According to your office note in August it states that patient's blood pressure is not under good control so we will try lisinopril HCTZ. Patient states you told her to stop the triamterene hctz. Please refer to office visit from August.

## 2016-03-19 NOTE — Telephone Encounter (Signed)
Left message on voicemail return call  

## 2016-03-19 NOTE — Telephone Encounter (Signed)
I suggest you speak with the patient find out where she got it filled call the pharmacy find out the name of what she thinks she may be on them we go from there

## 2016-03-22 ENCOUNTER — Other Ambulatory Visit: Payer: Self-pay | Admitting: Family Medicine

## 2016-03-22 MED ORDER — LISINOPRIL-HYDROCHLOROTHIAZIDE 10-12.5 MG PO TABS: 1.0000 | ORAL_TABLET | Freq: Every day | ORAL | 3 refills | Status: DC

## 2016-03-22 NOTE — Telephone Encounter (Signed)
Left message on voicemail to return call ([facility])

## 2016-03-22 NOTE — Telephone Encounter (Signed)
I recommend lisinopril 10 mg-HCTZ 12.5 mg, 1 daily, #30, 3 refills follow-up within 6 weeks to recheck blood pressure

## 2016-03-22 NOTE — Telephone Encounter (Signed)
Notified patient that recommend lisinopril 10 mg-HCTZ 12.5 mg, 1 daily, #30, 3 refills follow-up within 6 weeks to recheck blood pressure. Patient verbalized understanding.

## 2016-04-02 DIAGNOSIS — Z029 Encounter for administrative examinations, unspecified: Secondary | ICD-10-CM

## 2016-04-20 ENCOUNTER — Encounter: Payer: Self-pay | Admitting: Family Medicine

## 2016-04-20 ENCOUNTER — Ambulatory Visit (INDEPENDENT_AMBULATORY_CARE_PROVIDER_SITE_OTHER): Payer: BLUE CROSS/BLUE SHIELD | Admitting: Family Medicine

## 2016-04-20 VITALS — BP 132/90 | Temp 98.9°F | Ht 64.0 in | Wt 167.0 lb

## 2016-04-20 DIAGNOSIS — Z23 Encounter for immunization: Secondary | ICD-10-CM

## 2016-04-20 DIAGNOSIS — Z79891 Long term (current) use of opiate analgesic: Secondary | ICD-10-CM

## 2016-04-20 DIAGNOSIS — M160 Bilateral primary osteoarthritis of hip: Secondary | ICD-10-CM

## 2016-04-20 DIAGNOSIS — I10 Essential (primary) hypertension: Secondary | ICD-10-CM

## 2016-04-20 MED ORDER — CHLORZOXAZONE 500 MG PO TABS: 500.0000 mg | ORAL_TABLET | Freq: Three times a day (TID) | ORAL | 0 refills | Status: DC | PRN

## 2016-04-20 MED ORDER — TRIAMTERENE-HCTZ 37.5-25 MG PO TABS: 1.0000 | ORAL_TABLET | Freq: Every day | ORAL | 6 refills | Status: DC

## 2016-04-20 MED ORDER — OXYCODONE-ACETAMINOPHEN 5-325 MG PO TABS: ORAL_TABLET | ORAL | 0 refills | Status: DC

## 2016-04-20 NOTE — Patient Instructions (Signed)
Mild depression-would benefit with Celexa. I recommend taking a half a tablet daily for the first week then one daily thereafter. It is not unusual that it might cause nausea. Usually that goes away after the first week. If you are having any problems let us know. Obviously if you start feeling severely depressed you need to follow-up sooner. Otherwise I would like to see back in 3 months.

## 2016-04-20 NOTE — Progress Notes (Signed)
   Subjective:    Patient ID: Stephanie [name], female    DOB: 11/27/1960, 55 y.o.   MRN: [id number]  Hypertension  This is a chronic problem.   pt states since taking lisinopril/hctz she has been having dizziness, headaches, and cramping in hands and toes.   Right side of throat sore off and on for 2 days. Sore only in the morning. Patient denies high fever chills sweats wheezing difficulty breathing  She relates that she is unable to do much of anything. When she does try to do stuff her legs get very weak she gives out she describes severe pain in her left hip she tries to bear with it as much as she can't she occasionally has to take her pain medicine. She also states that she has low back pain that radiates into both but opts. [facility] they told her that they thought physical therapy would help most. They did not recommend any type of surgery. She states she saw orthopedist with [facility] who she did not feel was very helpful she is hopeful to see a different orthopedist. She's been unable to do any activity on a regular basis without having to lay down because of pain and discomfort Increased pain in hip and back Gives out frequently Uses crutch and cane Feels weak in leg Fell onto floor couldn't get up  Review of Systems  Constitutional: Negative for activity change and appetite change.  Gastrointestinal: Negative for abdominal pain and vomiting.  Musculoskeletal: Positive for arthralgias and back pain.  Neurological: Negative for weakness.  Psychiatric/Behavioral: Negative for confusion.       Objective:   Physical Exam  Constitutional: She appears well-nourished. No distress.  Cardiovascular: Normal rate, regular rhythm and normal heart sounds.   No murmur heard. Pulmonary/Chest: Effort normal and breath sounds normal. No respiratory distress.  Musculoskeletal: She exhibits no edema.  Lymphadenopathy:    She has no cervical  adenopathy.  Neurological: She is alert. She exhibits normal muscle tone.  Psychiatric: Her behavior is normal.  Vitals reviewed.         Assessment & Plan:  Severe osteoarthritis of the left hip-patient does not have insurance when she gets insurance we can set her up for [facility] opinion with [facility]. If the patient still having difficult time getting insurance we can set [facility] up at [facility]  Blood pressure per patient request switch back to Maxide this should do better for her.  Severe back pain and hip pain leg weakness-refill of oxycodone medication follow-up in 3 months  Depression-Celexa as directed follow-up sooner if any problems patient not suicidal  Long discussion held with the patient regarding her current status unfortunately she is unable to work currently because of severe hip pain and back pain. I don't know if she will get approved for disability. She is applying for disability. In her current status I do not feel that she can work a regular 12/17/1938 hour week job. I think it would be difficult to find any job who can accommodate her need to lay down frequently. I think also she would miss so many days work that she would not be able to hold a job because of her back in hip pain

## 2016-04-27 ENCOUNTER — Encounter: Payer: Self-pay | Admitting: Family Medicine

## 2016-04-27 LAB — BASIC METABOLIC PANEL
BUN / CREAT RATIO: 19 (ref 9–23)
BUN: 16 mg/dL (ref 6–24)
CHLORIDE: 97 mmol/L (ref 96–106)
CO2: 26 mmol/L (ref 18–29)
Calcium: 9.9 mg/dL (ref 8.7–10.2)
Creatinine, Ser: 0.83 mg/dL (ref 0.57–1.00)
GFR calc Af Amer: 92 mL/min/{1.73_m2} (ref >59)
GFR calc non Af Amer: 80 mL/min/{1.73_m2} (ref >59)
GLUCOSE: 83 mg/dL (ref 65–99)
Potassium: 3.7 mmol/L (ref 3.5–5.2)
SODIUM: 141 mmol/L (ref 134–144)

## 2016-05-18 ENCOUNTER — Telehealth: Payer: Self-pay | Admitting: Family Medicine

## 2016-05-18 DIAGNOSIS — M5441 Lumbago with sciatica, right side: Secondary | ICD-10-CM

## 2016-05-18 DIAGNOSIS — M160 Bilateral primary osteoarthritis of hip: Secondary | ICD-10-CM

## 2016-05-18 DIAGNOSIS — G8929 Other chronic pain: Secondary | ICD-10-CM

## 2016-05-18 MED ORDER — OXYCODONE-ACETAMINOPHEN 5-325 MG PO TABS: ORAL_TABLET | ORAL | 0 refills | Status: DC

## 2016-05-18 MED ORDER — CITALOPRAM HYDROBROMIDE 20 MG PO TABS: ORAL_TABLET | ORAL | 5 refills | Status: DC

## 2016-05-18 NOTE — Telephone Encounter (Signed)
celexa 20 one half qam for seven d , then one qam as per dr scott's notes numb 30 5 ref  Puzzling we did not write [facility] pain meds rx one mo ago when seen?? If not, do one mo  Have Dr Lorin Picketscott see this because was planning a potential referral when insur resumes

## 2016-05-18 NOTE — Telephone Encounter (Signed)
Patient said her insurance has been reinstated.  She said Dr. Lorin PicketScott wanted her to call in and let him know.  She would like refill on her oxyCODONE-acetaminophen (PERCOCET/ROXICET) 5-325 MG tablet.  Also, she said she was supposed to have a depression medication called in but [facility] told her they dont have anything.

## 2016-05-18 NOTE — Telephone Encounter (Signed)
Prescription sent electronically to pharmacy. Patient notified. Patient states she only received one prescription for percocet in December. Prescription printed and signed and up front for patient pick up. Patient states she will pick up rx on Monday.

## 2016-05-20 NOTE — Telephone Encounter (Signed)
Referral to [facility] because of severe osteoarthritis hip and back pain

## 2016-05-21 NOTE — Addendum Note (Signed)
Addended by: Lear NgMARTIN, MISTY L on: 05/21/2016 08:09 AM   Modules accepted: Orders

## 2016-05-23 ENCOUNTER — Encounter: Payer: Self-pay | Admitting: Family Medicine

## 2016-06-13 ENCOUNTER — Ambulatory Visit: Payer: Self-pay | Admitting: Family Medicine

## 2016-07-04 ENCOUNTER — Telehealth: Payer: Self-pay | Admitting: Family Medicine

## 2016-07-04 NOTE — Telephone Encounter (Signed)
Last seen 1/28

## 2016-07-04 NOTE — Telephone Encounter (Signed)
Requesting Rx for oxycodone. °

## 2016-07-05 ENCOUNTER — Other Ambulatory Visit: Payer: Self-pay | Admitting: *Deleted

## 2016-07-05 MED ORDER — OXYCODONE-ACETAMINOPHEN 5-325 MG PO TABS: ORAL_TABLET | ORAL | 0 refills | Status: DC

## 2016-07-05 NOTE — Telephone Encounter (Signed)
Pt.notified

## 2016-07-05 NOTE — Telephone Encounter (Signed)
The patient may have 2 separate prescriptions for 30 tablets of the oxycodone. One dated for 07/05/2016 the next one dated for 08/04/2016 the patient needs to factor in the office visit in April before further prescriptions will be given

## 2016-07-05 NOTE — Telephone Encounter (Signed)
Left message to return call to discuss with pt. Scripts at front for pickup.  

## 2016-07-24 ENCOUNTER — Ambulatory Visit: Payer: Self-pay | Admitting: Family Medicine

## 2016-07-30 ENCOUNTER — Telehealth: Payer: Self-pay | Admitting: Family Medicine

## 2016-07-30 NOTE — Telephone Encounter (Signed)
The form was completed as best as possible thank you

## 2016-07-30 NOTE — Telephone Encounter (Signed)
Patient has disability form to be filled out.She is waiting on her surgical clearance form to come in before she schedule appointment but she needs her papers filled out. In your yellow folder please review date /sign.

## 2016-08-02 ENCOUNTER — Telehealth: Payer: Self-pay | Admitting: Family Medicine

## 2016-08-02 NOTE — Telephone Encounter (Signed)
Patient requesting a letter stating her being disable since 09/24/14  for her mortgage company to help her with her mortgage. She is needing this by 4/2 when she sees them. If you can please have this by closing tomorrow. Sorry for short notice. Explained out of office next week.

## 2016-08-03 ENCOUNTER — Encounter: Payer: Self-pay | Admitting: Family Medicine

## 2016-08-03 NOTE — Telephone Encounter (Signed)
A letter was dictated and sent that you can give to the patient

## 2016-08-21 ENCOUNTER — Ambulatory Visit (HOSPITAL_COMMUNITY)
Admission: RE | Admit: 2016-08-21 | Discharge: 2016-08-21 | Disposition: A | Discharge status: Home / Self Care | Payer: BLUE CROSS/BLUE SHIELD | Source: Ambulatory Visit | Attending: Family Medicine | Admitting: Family Medicine

## 2016-08-21 ENCOUNTER — Encounter: Payer: Self-pay | Admitting: Family Medicine

## 2016-08-21 ENCOUNTER — Ambulatory Visit (INDEPENDENT_AMBULATORY_CARE_PROVIDER_SITE_OTHER): Payer: Medicaid Other | Admitting: Family Medicine

## 2016-08-21 VITALS — BP 144/92 | Ht 64.0 in | Wt 173.5 lb

## 2016-08-21 DIAGNOSIS — I10 Essential (primary) hypertension: Secondary | ICD-10-CM

## 2016-08-21 DIAGNOSIS — R600 Localized edema: Secondary | ICD-10-CM | POA: Insufficient documentation

## 2016-08-21 DIAGNOSIS — M25551 Pain in right hip: Secondary | ICD-10-CM | POA: Insufficient documentation

## 2016-08-21 MED ORDER — AMLODIPINE BESYLATE 5 MG PO TABS: 5.0000 mg | ORAL_TABLET | Freq: Every day | ORAL | 5 refills | Status: DC

## 2016-08-21 MED ORDER — OXYCODONE-ACETAMINOPHEN 5-325 MG PO TABS: ORAL_TABLET | ORAL | 0 refills | Status: DC

## 2016-08-21 NOTE — Progress Notes (Signed)
   Subjective:    Patient ID: Stephanie [name], female    DOB: [birth year], 56 y.o.   MRN: [id number]  HPI Patient in today for edema, and numbness to right leg.States leg swelling has been going on for months. Currently worsening. Has pending hip replacement.This patient relates that she's been having some increased swelling in the 5 region pain in the hip region she is seen orthopedist they have had her see a different orthopedist who is recommending a hip surgery and replacement more than likely replacement on the other side as well having a moderate amount of pain and discomfort in addition to this feeling stressed.  Patient is disabled unable to work. Walks with a cane has a lot of pain whether she is sitting standing or laying down but it is better laying she is trying to do her best not have any falls   [id number] States no other concerns this visit.   Review of Systems Denies wheezing chest tightness pain headaches numbness is present in the right leg with sciatica as well as hip pain and discomfort    Objective:   Physical Exam Swelling of the right thigh is noted compared to left eye 1 inch in circumference compared to the left side calf normal lungs clear heart regular blood pressure recheck better  Patient was evaluated, hospital was called, stat tests ordered, patient had stat test, results were received, discussion was held with the patient.  25 minutes was spent with the patient. Greater than half the time was spent in discussion and answering questions and counseling regarding the issues that the patient came in for today.      Assessment & Plan:  HTN subpar control add amlodipine 5 mg daily. Recheck in 3 weeks time to see how blood pressure is in order to give surgical clearance with Dr. Charlann Boxer  Swelling in the right thigh we need to make sure that there is not a blood clot stat ultrasound was ordered. Patient was told if any abnormality we will need to do additional testing  or possible blood thinner  Ultrasound was negative for any type of blood clot which is good

## 2016-08-24 ENCOUNTER — Telehealth: Payer: Self-pay | Admitting: Family Medicine

## 2016-08-24 NOTE — Telephone Encounter (Signed)
Patient dropped off a letter for Dr. Lorin Picket to review requesting a written statement regarding handicap equipment that may help her.  Please see in forms folder in office.

## 2016-08-31 ENCOUNTER — Encounter: Payer: Self-pay | Admitting: Family Medicine

## 2016-08-31 NOTE — Telephone Encounter (Signed)
The letter's were completed. I also did additional documentation on her forms thank you

## 2016-09-11 ENCOUNTER — Encounter: Payer: Self-pay | Admitting: Family Medicine

## 2016-09-11 ENCOUNTER — Ambulatory Visit (INDEPENDENT_AMBULATORY_CARE_PROVIDER_SITE_OTHER): Payer: Medicaid Other | Admitting: Family Medicine

## 2016-09-11 VITALS — BP 138/94 | Ht 64.0 in | Wt 169.0 lb

## 2016-09-11 DIAGNOSIS — I10 Essential (primary) hypertension: Secondary | ICD-10-CM

## 2016-09-11 MED ORDER — AMLODIPINE BESYLATE 10 MG PO TABS: 10.0000 mg | ORAL_TABLET | Freq: Every day | ORAL | 5 refills | Status: DC

## 2016-09-11 NOTE — Progress Notes (Signed)
   Subjective:    Patient ID: Stephanie [name], female    DOB: 09-Apr-1961, 56 y.o.   MRN: [id number]  Hypertension  Chronicity: follow up on blood pressure for surgical clearance.  This patient is supposed to have hip surgery in the coming weeks but is had elevated blood pressure she denies any chest tightness pressure pain or shortness of breath   Pt states she never started on celexa because pharm did not have rx. Pt would still like to get rx.  She states her moods at times her stress which she denies being excessively depressed not suicidal  Review of Systems Denies any chest tightness pressure pain shortness breath she does relate severe back pain and hip pain and pain radiating down both legs worse in the right leg decreased energy because of the pain    Objective:   Physical Exam  Lungs clear hearts regular pulse normal blood pressure is mildly elevated subjected discomfort in her back and hips      Assessment & Plan:  Back pain per specialists on pain medicine  Hip pain she will need surgery she is looking at seeing a specialist that her other orthopedist recommends she will let us know who that is  Elevated blood pressure increase her amlodipine 10 mg daily continue diuretic. Recheck blood pressure in a couple weeks' time if progressive troubles will need to add additional medicine healthy diet activity as tolerated

## 2016-09-11 NOTE — Patient Instructions (Signed)
DASH Eating Plan DASH stands for "Dietary Approaches to Stop Hypertension." The DASH eating plan is a healthy eating plan that has been shown to reduce high blood pressure (hypertension). It may also reduce your risk for type 2 diabetes, heart disease, and stroke. The DASH eating plan may also help with weight loss. What are tips for following this plan? General guidelines  Avoid eating more than 2,300 mg (milligrams) of salt (sodium) a day. If you have hypertension, you may need to reduce your sodium intake to 1,500 mg a day.  Limit alcohol intake to no more than 1 drink a day for nonpregnant women and 2 drinks a day for men. One drink equals 12 oz of beer, 5 oz of wine, or 1 oz of hard liquor.  Work with your health care provider to maintain a healthy body weight or to lose weight. Ask what an ideal weight is for you.  Get at least 30 minutes of exercise that causes your heart to beat faster (aerobic exercise) most days of the week. Activities may include walking, swimming, or biking.  Work with your health care provider or diet and nutrition specialist (dietitian) to adjust your eating plan to your individual calorie needs. Reading food labels  Check food labels for the amount of sodium per serving. Choose foods with less than 5 percent of the Daily Value of sodium. Generally, foods with less than 300 mg of sodium per serving fit into this eating plan.  To find whole grains, look for the word "whole" as the first word in the ingredient list. Shopping  Buy products labeled as "low-sodium" or "no salt added."  Buy fresh foods. Avoid canned foods and premade or frozen meals. Cooking  Avoid adding salt when cooking. Use salt-free seasonings or herbs instead of table salt or sea salt. Check with your health care provider or pharmacist before using salt substitutes.  Do not fry foods. Cook foods using healthy methods such as baking, boiling, grilling, and broiling instead.  Cook with  heart-healthy oils, such as olive, canola, soybean, or sunflower oil. Meal planning   Eat a balanced diet that includes: ? 5 or more servings of fruits and vegetables each day. At each meal, try to fill half of your plate with fruits and vegetables. ? Up to 6-8 servings of whole grains each day. ? Less than 6 oz of lean meat, poultry, or fish each day. A 3-oz serving of meat is about the same size as a deck of cards. One egg equals 1 oz. ? 2 servings of low-fat dairy each day. ? A serving of nuts, seeds, or beans 5 times each week. ? Heart-healthy fats. Healthy fats called Omega-3 fatty acids are found in foods such as flaxseeds and coldwater fish, like sardines, salmon, and mackerel.  Limit how much you eat of the following: ? Canned or prepackaged foods. ? Food that is high in trans fat, such as fried foods. ? Food that is high in saturated fat, such as fatty meat. ? Sweets, desserts, sugary drinks, and other foods with added sugar. ? Full-fat dairy products.  Do not salt foods before eating.  Try to eat at least 2 vegetarian meals each week.  Eat more home-cooked food and less restaurant, buffet, and fast food.  When eating at a restaurant, ask that your food be prepared with less salt or no salt, if possible. What foods are recommended? The items listed may not be a complete list. Talk with your dietitian about what   dietary choices are best for you. Grains Whole-grain or whole-wheat bread. Whole-grain or whole-wheat pasta. Brown rice. Oatmeal. Quinoa. Bulgur. Whole-grain and low-sodium cereals. Pita bread. Low-fat, low-sodium crackers. Whole-wheat flour tortillas. Vegetables Fresh or frozen vegetables (raw, steamed, roasted, or grilled). Low-sodium or reduced-sodium tomato and vegetable juice. Low-sodium or reduced-sodium tomato sauce and tomato paste. Low-sodium or reduced-sodium canned vegetables. Fruits All fresh, dried, or frozen fruit. Canned fruit in natural juice (without  added sugar). Meat and other protein foods Skinless chicken or turkey. Ground chicken or turkey. Pork with fat trimmed off. Fish and seafood. Egg whites. Dried beans, peas, or lentils. Unsalted nuts, nut butters, and seeds. Unsalted canned beans. Lean cuts of beef with fat trimmed off. Low-sodium, lean deli meat. Dairy Low-fat (1%) or fat-free (skim) milk. Fat-free, low-fat, or reduced-fat cheeses. Nonfat, low-sodium ricotta or cottage cheese. Low-fat or nonfat yogurt. Low-fat, low-sodium cheese. Fats and oils Soft margarine without trans fats. Vegetable oil. Low-fat, reduced-fat, or light mayonnaise and salad dressings (reduced-sodium). Canola, safflower, olive, soybean, and sunflower oils. Avocado. Seasoning and other foods Herbs. Spices. Seasoning mixes without salt. Unsalted popcorn and pretzels. Fat-free sweets. What foods are not recommended? The items listed may not be a complete list. Talk with your dietitian about what dietary choices are best for you. Grains Baked goods made with fat, such as croissants, muffins, or some breads. Dry pasta or rice meal packs. Vegetables Creamed or fried vegetables. Vegetables in a cheese sauce. Regular canned vegetables (not low-sodium or reduced-sodium). Regular canned tomato sauce and paste (not low-sodium or reduced-sodium). Regular tomato and vegetable juice (not low-sodium or reduced-sodium). Pickles. Olives. Fruits Canned fruit in a light or heavy syrup. Fried fruit. Fruit in cream or butter sauce. Meat and other protein foods Fatty cuts of meat. Ribs. Fried meat. Bacon. Sausage. Bologna and other processed lunch meats. Salami. Fatback. Hotdogs. Bratwurst. Salted nuts and seeds. Canned beans with added salt. Canned or smoked fish. Whole eggs or egg yolks. Chicken or turkey with skin. Dairy Whole or 2% milk, cream, and half-and-half. Whole or full-fat cream cheese. Whole-fat or sweetened yogurt. Full-fat cheese. Nondairy creamers. Whipped toppings.  Processed cheese and cheese spreads. Fats and oils Butter. Stick margarine. Lard. Shortening. Ghee. Bacon fat. Tropical oils, such as coconut, palm kernel, or palm oil. Seasoning and other foods Salted popcorn and pretzels. Onion salt, garlic salt, seasoned salt, table salt, and sea salt. Worcestershire sauce. Tartar sauce. Barbecue sauce. Teriyaki sauce. Soy sauce, including reduced-sodium. Steak sauce. Canned and packaged gravies. Fish sauce. Oyster sauce. Cocktail sauce. Horseradish that you find on the shelf. Ketchup. Mustard. Meat flavorings and tenderizers. Bouillon cubes. Hot sauce and Tabasco sauce. Premade or packaged marinades. Premade or packaged taco seasonings. Relishes. Regular salad dressings. Where to find more information:  National Heart, Lung, and Blood Institute: www.nhlbi.nih.gov  American Heart Association: www.heart.org Summary  The DASH eating plan is a healthy eating plan that has been shown to reduce high blood pressure (hypertension). It may also reduce your risk for type 2 diabetes, heart disease, and stroke.  With the DASH eating plan, you should limit salt (sodium) intake to 2,300 mg a day. If you have hypertension, you may need to reduce your sodium intake to 1,500 mg a day.  When on the DASH eating plan, aim to eat more fresh fruits and vegetables, whole grains, lean proteins, low-fat dairy, and heart-healthy fats.  Work with your health care provider or diet and nutrition specialist (dietitian) to adjust your eating plan to your individual   calorie needs. This information is not intended to replace advice given to you by your health care provider. Make sure you discuss any questions you have with your health care provider. Document Released: 04/19/2011 Document Revised: 04/23/2016 Document Reviewed: 04/23/2016 Elsevier Interactive Patient Education  2017 Elsevier Inc.  

## 2016-10-01 ENCOUNTER — Ambulatory Visit: Payer: BLUE CROSS/BLUE SHIELD | Admitting: Family Medicine

## 2016-10-10 ENCOUNTER — Ambulatory Visit (INDEPENDENT_AMBULATORY_CARE_PROVIDER_SITE_OTHER): Payer: Medicaid Other | Admitting: Family Medicine

## 2016-10-10 ENCOUNTER — Encounter: Payer: Self-pay | Admitting: Family Medicine

## 2016-10-10 VITALS — BP 124/88 | Ht 64.0 in | Wt 172.0 lb

## 2016-10-10 DIAGNOSIS — G8929 Other chronic pain: Secondary | ICD-10-CM

## 2016-10-10 DIAGNOSIS — Z113 Encounter for screening for infections with a predominantly sexual mode of transmission: Secondary | ICD-10-CM

## 2016-10-10 DIAGNOSIS — Z13220 Encounter for screening for lipoid disorders: Secondary | ICD-10-CM

## 2016-10-10 DIAGNOSIS — M5441 Lumbago with sciatica, right side: Secondary | ICD-10-CM | POA: Diagnosis not present

## 2016-10-10 DIAGNOSIS — R5383 Other fatigue: Secondary | ICD-10-CM

## 2016-10-10 DIAGNOSIS — M160 Bilateral primary osteoarthritis of hip: Secondary | ICD-10-CM

## 2016-10-10 DIAGNOSIS — I10 Essential (primary) hypertension: Secondary | ICD-10-CM | POA: Diagnosis not present

## 2016-10-10 DIAGNOSIS — Z1159 Encounter for screening for other viral diseases: Secondary | ICD-10-CM | POA: Diagnosis not present

## 2016-10-10 MED ORDER — TRIAMTERENE-HCTZ 50-25 MG PO CAPS: 1.0000 | ORAL_CAPSULE | ORAL | 5 refills | Status: DC

## 2016-10-10 MED ORDER — OXYCODONE-ACETAMINOPHEN 5-325 MG PO TABS: ORAL_TABLET | ORAL | 0 refills | Status: DC

## 2016-10-10 MED ORDER — KETOCONAZOLE 2 % EX CREA: TOPICAL_CREAM | CUTANEOUS | 3 refills | Status: DC

## 2016-10-10 MED ORDER — TRIAMCINOLONE ACETONIDE 0.1 % EX CREA: TOPICAL_CREAM | CUTANEOUS | 4 refills | Status: DC

## 2016-10-10 NOTE — Progress Notes (Signed)
   Subjective:    Patient ID: Stephanie [name], female    DOB: 1960/07/12, 56 y.o.   MRN: [id number]  Hypertension  Compliance problems include exercise (eats healthy, takes meds every day, not able to do much exercise but does walk around the house as much as possible with her walker).    Itchy rash on feet. Came up 2 -3 weeks ago. Has been using cream for foot fungus.   Pain medicine does improve function. Does not abuse it. Denies feeling drugged by it. Using cane Dr.Alusio Review of Systems     Objective:   Physical Exam  Patient walks with a cane she is unable to sit for any significant length of time she has to stand up often during our exam she also relates the pain discomfort severe she is been unable to work and is considered to be disabled. Even with hip surgery I would be very surprised if her back and sciatica improve to the point where she can return to work. She is pursuing disability which I support.  Patient does have surgical clearance but it was stressed to her that blood pressure needs to look good before surgery    Assessment & Plan:  Significant right hip pain has upcoming surgery oxycodone maybe use for pain no more than 3 per day cautioned drowsiness  Rash probable yeast related Nizoral cream triamcinolone when necessary use Nizoral over the course of the next 3 weeks let us know if ongoing troubles  HTN subpar control increase diuretic component Dyazide 50/25 stop 37.5/25  Check lab work await results  Follow-up in approximate 3 months  Patient will monitor blood pressure if it does not seem to be doing well she will let us known follow-up sooner before surgery

## 2016-10-30 ENCOUNTER — Ambulatory Visit (INDEPENDENT_AMBULATORY_CARE_PROVIDER_SITE_OTHER): Payer: Self-pay | Admitting: Allergy and Immunology

## 2016-10-30 ENCOUNTER — Encounter: Payer: Self-pay | Admitting: Allergy and Immunology

## 2016-10-30 VITALS — BP 148/108 | HR 80 | Resp 18

## 2016-10-30 DIAGNOSIS — Z886 Allergy status to analgesic agent status: Secondary | ICD-10-CM

## 2016-10-30 DIAGNOSIS — J3089 Other allergic rhinitis: Secondary | ICD-10-CM

## 2016-10-30 DIAGNOSIS — J4540 Moderate persistent asthma, uncomplicated: Secondary | ICD-10-CM

## 2016-10-30 DIAGNOSIS — H1010 Acute atopic conjunctivitis, unspecified eye: Secondary | ICD-10-CM

## 2016-10-30 DIAGNOSIS — J309 Allergic rhinitis, unspecified: Secondary | ICD-10-CM

## 2016-10-30 MED ORDER — MONTELUKAST SODIUM 10 MG PO TABS: ORAL_TABLET | ORAL | 5 refills | Status: DC

## 2016-10-30 MED ORDER — FLUTICASONE FUROATE-VILANTEROL 200-25 MCG/INH IN AEPB: 1.0000 | INHALATION_SPRAY | Freq: Every day | RESPIRATORY_TRACT | 5 refills | Status: DC

## 2016-10-30 NOTE — Progress Notes (Signed)
Follow-up Note  Referring Provider: Babs [name], Stephanie A, MD Primary Provider: Babs [name], Stephanie A, MD Date of Office Visit: 10/30/2016  Subjective:   Stephanie [name] (DOB: 01/16/1961) is a 56 y.o. 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 I last saw her in this [name] September 2017.  When she uses Breo she does very well and has no wheezing and coughing and rarely uses a short acting bronchodilator. When she does not use Breo she does develop issues with chest tightness and coughing and some wheezing. She has not required any steroid to treat an exacerbation of her asthma.  When she uses a nasal steroid she does very well with her nose. However, when she does not use a nasal steroid she develops problems with nasal congestion and sneezing. It does not sound as though she has required an antibiotic to treat an episode of sinusitis.  She has no insurance and cannot afford her medications. She attempts to get samples but because of a back issue she has very little mobility and has a difficult time coming to the office for samples. She is scheduled to have back surgery in July of this year.  Allergies as of 10/30/2016      Reactions   Aspirin Swelling   Gabapentin    drowsy   Hydrocodone    Itching      Medication List      amLODipine 10 MG tablet Commonly known as:  NORVASC Take 1 tablet (10 mg total) by mouth daily.   CALCIUM PO Take by mouth daily.   chlorzoxazone 500 MG tablet Commonly known as:  PARAFON Take 1 tablet (500 mg total) by mouth 3 (three) times daily as needed for muscle spasms.   citalopram 20 MG tablet Commonly known as:  CELEXA One half tablet qam for 7 days, then one tablet qam   EPIPEN 2-PAK 0.3 mg/0.3 mL Soaj injection Generic  drug:  EPINEPHrine USE AS DIRECTED FOR LIFE THREATENING ALLERGIC REACTIONS   ketoconazole 2 % cream Commonly known as:  NIZORAL Apply to rash bid for 3 weeks   montelukast 10 MG tablet Commonly known as:  SINGULAIR Take 1 tablet (10 mg total) by mouth at bedtime.   multivitamin tablet Take 1 tablet by mouth daily.   oxyCODONE-acetaminophen 5-325 MG tablet Commonly known as:  PERCOCET/ROXICET One tid prn pain   PROAIR HFA 108 (90 Base) MCG/ACT inhaler Generic drug:  albuterol Inhale 2 puffs into the lungs every 6 (six) hours as needed for wheezing or shortness of breath.   triamcinolone cream 0.1 % Commonly known as:  KENALOG Apply bid prn itching   triamterene-hydrochlorothiazide 50-25 MG capsule Commonly known as:  DYAZIDE Take 1 capsule by mouth every morning.   VITAMIN E PO Take by mouth daily.       Past Medical History:  Diagnosis Date  . Allergic rhinitis   . Asthma     Past Surgical History:  Procedure Laterality Date  . CESAREAN SECTION  1985, 1989, 1995    Review of systems negative except as noted in HPI / PMHx or noted below:  Review of Systems  Constitutional: Negative.   HENT: Negative.   Eyes: Negative.   Respiratory: Negative.   Cardiovascular: Negative.   Gastrointestinal: Negative.   Genitourinary: Negative.   Musculoskeletal: Negative.  Skin: Negative.   Neurological: Negative.   Endo/Heme/Allergies: Negative.   Psychiatric/Behavioral: Negative.      Objective:   Vitals:   10/30/16 1704  BP: (!) 148/108  Pulse: 80  Resp: 18          Physical Exam  Constitutional: She is well-developed, well-nourished, and in no distress.  HENT:  Head: Normocephalic.  Right Ear: Tympanic membrane, external ear and ear canal normal.  Left Ear: Tympanic membrane, external ear and ear canal normal.  Nose: Mucosal edema present. No rhinorrhea.  Mouth/Throat: Uvula is midline, oropharynx is clear and moist and mucous membranes are normal.  No oropharyngeal exudate.  Eyes: Conjunctivae are normal.  Neck: Trachea normal. No tracheal tenderness present. No tracheal deviation present. No thyromegaly present.  Cardiovascular: Normal rate, regular rhythm, S1 normal and S2 normal.   Murmur (Systolic) heard. Pulmonary/Chest: Breath sounds normal. No stridor. No respiratory distress. She has no wheezes. She has no rales.  Musculoskeletal: She exhibits no edema.  Lymphadenopathy:       Head (right side): No tonsillar adenopathy present.       Head (left side): No tonsillar adenopathy present.    She has no cervical adenopathy.  Neurological: She is alert. Gait normal.  Skin: No rash noted. She is not diaphoretic. No erythema. Nails show no clubbing.  Psychiatric: Mood and affect normal.    Diagnostics:    Spirometry was performed and demonstrated an FEV1 of 1.77 at 79 % of predicted.  Assessment and Plan:   1. Not well controlled moderate persistent asthma   2. Other allergic rhinitis   3. Allergy to NSAIDs   4. Allergic rhinoconjunctivitis     1. Treat inflammation:   A. Breo 200 one inhalation 1 time per day ([name] samples)  B. montelukast 10 mg one tablet one time per day  C. OTC Nasacort / Rhinocort one spray each nostril once a day  2. Continue ProAir HFA 2 puffs every 4-6 hours if needed  3. Continue OTC antihistamine and Epi-Pen if needed   4. Return to [name] in 6 months or earlier if problem  5. Obtain fall flu vaccine  6. NO NSAID to treat pain.  We have once again given Stephanie [name] samples of her anti-inflammatory agents for her respiratory tract and we will see her back in this [name] in 6 months or earlier if there is a problem. She is welcome to return here for more samples and I have encouraged her once again to enter into the free drug program provided by the manufacturer of these medications. She will contact me during the interval should there be a significant problem.  Stephanie Schimke, MD Allergy /  Immunology  [facility]

## 2016-10-30 NOTE — Patient Instructions (Addendum)
  1. Continue to Treat inflammation:   A. Breo 200 one inhalation 1 time per day (clinic samples)  B. montelukast 10 mg one tablet one time per day  C. OTC Nasacort / Rhinocort one spray each nostril once a day  2. Continue ProAir HFA 2 puffs every 4-6 hours if needed  3. Continue OTC antihistamine and Epi-Pen if needed   4. Return to clinic in 6 months or earlier if problem  5. Obtain fall flu vaccine  6. NO NSAID to treat pain.

## 2016-11-12 ENCOUNTER — Telehealth: Payer: Self-pay | Admitting: Family Medicine

## 2016-11-12 NOTE — Telephone Encounter (Signed)
Pt dropped off a letter for Dr. Lorin PicketScott. Letter is in folder in office.

## 2016-11-13 ENCOUNTER — Telehealth: Payer: Self-pay | Admitting: Family Medicine

## 2016-11-13 NOTE — Telephone Encounter (Signed)
Patient requesting a prescription for wheelchair to be sent to [facility] due to its hard for her to get around.

## 2016-11-15 NOTE — Telephone Encounter (Signed)
Rx written, given to Dr. Lorin PicketScott to sign and will fax to [facility].

## 2016-11-15 NOTE — Telephone Encounter (Signed)
She may have a prescription for wheelchair, she also needs to do a follow-up office visit within the next 2 weeks for surgical clearance for her hip surgery.

## 2016-11-15 NOTE — Telephone Encounter (Signed)
Rx faxed to [facility]. I called and left a vm to r/c.

## 2016-11-15 NOTE — Telephone Encounter (Signed)
Patient notified

## 2016-11-16 ENCOUNTER — Ambulatory Visit: Payer: Self-pay | Admitting: Family Medicine

## 2016-11-18 NOTE — Telephone Encounter (Signed)
We will discuss this when the patient comes in this week

## 2016-11-19 ENCOUNTER — Ambulatory Visit: Payer: Self-pay | Admitting: Family Medicine

## 2016-11-21 ENCOUNTER — Ambulatory Visit (INDEPENDENT_AMBULATORY_CARE_PROVIDER_SITE_OTHER): Payer: Medicaid Other | Admitting: Family Medicine

## 2016-11-21 ENCOUNTER — Encounter: Payer: Self-pay | Admitting: Family Medicine

## 2016-11-21 VITALS — BP 142/84 | Ht 64.0 in | Wt 182.0 lb

## 2016-11-21 DIAGNOSIS — I10 Essential (primary) hypertension: Secondary | ICD-10-CM

## 2016-11-21 DIAGNOSIS — M160 Bilateral primary osteoarthritis of hip: Secondary | ICD-10-CM

## 2016-11-21 DIAGNOSIS — R011 Cardiac murmur, unspecified: Secondary | ICD-10-CM | POA: Diagnosis not present

## 2016-11-21 DIAGNOSIS — G8929 Other chronic pain: Secondary | ICD-10-CM | POA: Diagnosis not present

## 2016-11-21 DIAGNOSIS — R0789 Other chest pain: Secondary | ICD-10-CM

## 2016-11-21 DIAGNOSIS — M5441 Lumbago with sciatica, right side: Secondary | ICD-10-CM | POA: Diagnosis not present

## 2016-11-21 MED ORDER — CHLORZOXAZONE 500 MG PO TABS: 500.0000 mg | ORAL_TABLET | Freq: Three times a day (TID) | ORAL | 0 refills | Status: DC | PRN

## 2016-11-21 MED ORDER — AMLODIPINE BESYLATE 5 MG PO TABS: 5.0000 mg | ORAL_TABLET | Freq: Every day | ORAL | 11 refills | Status: DC

## 2016-11-21 NOTE — Progress Notes (Signed)
Subjective:    Patient ID: Stephanie [name], female    DOB: April 08, 1961, 56 y.o.   MRN: [id number]  HPI  Patient arrives for a follow up on hip pain. Severe pain discomfort and hip also low back radiates down the right leg the hip is severe enough to make it difficult to squat stand twist walks with a cane unable to do any lifting has difficult time sitting for any length of time has to either lay down or stretch she cannot stand for more than a few minutes at a time she cannot lift or carry anything because she uses a cane to get around pain medicine takes the edge off but she is still in severe pain pain makes it difficult for her to focus blood pressures been moderately elevated is been difficult to control her working with various medication watching diet unable to do exercise on a regular basis patient is been unable to get surgery completed because blood pressure is been in a dangerous range making surgery not possible recently she has been more of the mindset that surgery will help her and she hopes it will be completed in October patient has not been able to work since August 2016 when she started having severe back pain radiating down the leg. 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 has developing significant arthritis more than likely down the road she will need a left hip replacement as well Review of Systems Please see above    Objective:   Physical Exam Blood pressure moderately elevated lungs clear heart regular murmur noted mainly on right sternal border pulse normal extremities no edema severe low back pain positive straight leg raise on the right patient has difficult time getting onto and off the exam table patient walks with a cane difficult time walking diminished strength right leg because of pain       Assessment & Plan:  Heart murmur  referral to cardiology further evaluation EKG looks good  HTN subpar control add amlodipine continue Dyazide follow-up patient in 4-6 weeks to see how this is doing watch diet stay active as best as possible  Severe osteoarthritis of both hips has upcoming surgery she should do okay with but I do recommend pain medicine as necessary cautioned drowsiness to not abuse take only when at home avoid driving with medicine when possible, patient may drive when needed as long as she is not feeling drowsy  Patient relates intermittent sharp chest pain she should be fine but we will get cardiology to see her as part of a presurgical clearance  Significant asthma issues continue inhalers as directed  Patients chronic pain has sugar in moderate depression for the patient she is doing well on her medication she needs to continue this medicine  Disability-I believe this patient is totally disabled I do not feel that she can hold down any type or regular or standard job. Because of her severe pain and discomfort she is unable to sit for any significant length of time without standing or lying down as for standing she cannot stand more than a few minutes without sitting or laying down. She has to take pain medication which makes it difficult for her to focus the pain makes it difficult for her to focus. In addition to this her blood pressure is been significantly elevated which is made getting  surgery for her hip difficult. I do not see this patient being unable to work at all-patient is aware that disability is a legal determination aced upon age education background and chronic health illness

## 2016-11-22 ENCOUNTER — Encounter: Payer: Self-pay | Admitting: Family Medicine

## 2016-11-26 ENCOUNTER — Encounter: Payer: Self-pay | Admitting: Family Medicine

## 2016-11-27 ENCOUNTER — Encounter: Payer: Self-pay | Admitting: Family Medicine

## 2016-11-27 ENCOUNTER — Ambulatory Visit: Payer: Self-pay | Admitting: Family Medicine

## 2016-12-10 ENCOUNTER — Telehealth: Payer: Self-pay | Admitting: Family Medicine

## 2016-12-10 NOTE — Telephone Encounter (Signed)
Pt is requesting a refill on her pain medication: oxyCODONE-acetaminophen (PERCOCET/ROXICET) 5-325 MG tablet

## 2016-12-11 MED ORDER — OXYCODONE-ACETAMINOPHEN 5-325 MG PO TABS: ORAL_TABLET | ORAL | 0 refills | Status: DC

## 2016-12-11 NOTE — Telephone Encounter (Signed)
This patient was seen for chronic pain earlier in the month. I did check the drug registry. She may have refill on her oxycodone 5 mg/325 mg-number 90-1 3 times a day when necessary pain caution drowsiness may be filled on July 31, she may have a second prescription dated for 01/10/2017 same quantity, she should be informed that she will need to do a follow-up visit mid to late September

## 2016-12-11 NOTE — Telephone Encounter (Signed)
Prescriptions up front for pick up. Patient notified 

## 2016-12-17 ENCOUNTER — Ambulatory Visit: Payer: BLUE CROSS/BLUE SHIELD | Admitting: Cardiovascular Disease

## 2016-12-17 ENCOUNTER — Encounter: Payer: Self-pay | Admitting: Cardiovascular Disease

## 2016-12-17 ENCOUNTER — Ambulatory Visit (INDEPENDENT_AMBULATORY_CARE_PROVIDER_SITE_OTHER): Payer: BLUE CROSS/BLUE SHIELD | Admitting: Cardiovascular Disease

## 2016-12-17 ENCOUNTER — Telehealth: Payer: Self-pay | Admitting: Cardiovascular Disease

## 2016-12-17 VITALS — BP 145/90 | HR 75 | Ht 64.0 in | Wt 184.0 lb

## 2016-12-17 DIAGNOSIS — R079 Chest pain, unspecified: Secondary | ICD-10-CM

## 2016-12-17 DIAGNOSIS — I10 Essential (primary) hypertension: Secondary | ICD-10-CM

## 2016-12-17 DIAGNOSIS — R011 Cardiac murmur, unspecified: Secondary | ICD-10-CM | POA: Diagnosis not present

## 2016-12-17 NOTE — Progress Notes (Signed)
CARDIOLOGY CONSULT NOTE  Patient ID: Stephanie [name] MRN: [id number] DOB/AGE: 1961/01/19 56 y.o.  Admit date: (Not on file) Primary Physician: Babs Sciara, MD Referring Physician: Gerda Diss  Reason for Consultation: murmur and chest pain  HPI: Stephanie [name] is a 56 y.o. female who is being seen today for the evaluation of a cardiac murmur and chest pain at the request of Luking, Jonna Coup, MD.   She has a history of hypertension, severe bilateral hip osteoarthritis, and asthma.  I personally interpreted the ECG performed on 11/26/16 which demonstrated normal sinus rhythm with no significant ischemic abnormalities, nor any arrhythmias.  She walks with a cane due to severe bilateral hip pain. She is scheduled to undergo right total hip arthroplasty on 02/13/2017.  She has occasional chest pains followed by belching and flatus. She denies exertional chest discomfort. She denies orthopnea, lightheadedness, dizziness, and syncope. She occasionally has palpitations. She has some mild exertional dyspnea. She said her hips have been bothering her for at least 5 years.  Family history: Father developed coronary artery disease at age 15. He was a smoker. He had several brothers who developed heart disease in their 69s or younger. She believes they were smokers as well.    Allergies  Allergen Reactions  . Aspirin Swelling  . Gabapentin     drowsy  . Hydrocodone     Itching     Current Outpatient Prescriptions  Medication Sig Dispense Refill  . albuterol (PROVENTIL HFA) 108 (90 Base) MCG/ACT inhaler Inhale two puffs every four to six hours as needed for cough or wheeze.    Marland Kitchen amLODipine (NORVASC) 5 MG tablet Take 1 tablet (5 mg total) by mouth daily. 30 tablet 11  . CALCIUM PO Take by mouth daily.    . chlorzoxazone (PARAFON) 500 MG tablet Take 1 tablet (500 mg total) by mouth 3 (three) times daily as needed for muscle spasms. 30 tablet 0  . citalopram (CELEXA) 20 MG tablet One  half tablet qam for 7 days, then one tablet qam 30 tablet 5  . fluticasone furoate-vilanterol (BREO ELLIPTA) 200-25 MCG/INH AEPB Inhale 1 puff into the lungs daily. Rinse, gargle, and spit after use. 60 each 5  . ketoconazole (NIZORAL) 2 % cream Apply to rash bid for 3 weeks 30 g 3  . montelukast (SINGULAIR) 10 MG tablet Take one tablet once daily. 30 tablet 5  . Multiple Vitamin (MULTIVITAMIN) tablet Take 1 tablet by mouth daily.    Marland Kitchen oxyCODONE-acetaminophen (PERCOCET/ROXICET) 5-325 MG tablet One tid prn pain 90 tablet 0  . triamcinolone cream (KENALOG) 0.1 % Apply bid prn itching 45 g 4  . triamterene-hydrochlorothiazide (DYAZIDE) 50-25 MG capsule Take 1 capsule by mouth every morning. 30 capsule 5  . VITAMIN E PO Take by mouth daily.     No current facility-administered medications for this visit.     Past Medical History:  Diagnosis Date  . Allergic rhinitis   . Asthma     Past Surgical History:  Procedure Laterality Date  . CESAREAN SECTION  1985, 1989, 1995    Social History   Social History  . Marital status: Divorced    Spouse name: N/A  . Number of children: N/A  . Years of education: N/A   Occupational History  . Not on file.   Social History Main Topics  . Smoking status: Former Smoker    Packs/day: 0.25    Years: 18.00    Types: Cigarettes  Start date: 09/24/1994    Quit date: 03/06/2013  . Smokeless tobacco: Never Used  . Alcohol use Not on file  . Drug use: Unknown  . Sexual activity: Not on file   Other Topics Concern  . Not on file   Social History Narrative  . No narrative on file      Current Meds  Medication Sig  . albuterol (PROVENTIL HFA) 108 (90 Base) MCG/ACT inhaler Inhale two puffs every four to six hours as needed for cough or wheeze.  Marland Kitchen. amLODipine (NORVASC) 5 MG tablet Take 1 tablet (5 mg total) by mouth daily.  Marland Kitchen. CALCIUM PO Take by mouth daily.  . chlorzoxazone (PARAFON) 500 MG tablet Take 1 tablet (500 mg total) by mouth 3  (three) times daily as needed for muscle spasms.  . citalopram (CELEXA) 20 MG tablet One half tablet qam for 7 days, then one tablet qam  . fluticasone furoate-vilanterol (BREO ELLIPTA) 200-25 MCG/INH AEPB Inhale 1 puff into the lungs daily. Rinse, gargle, and spit after use.  . ketoconazole (NIZORAL) 2 % cream Apply to rash bid for 3 weeks  . montelukast (SINGULAIR) 10 MG tablet Take one tablet once daily.  . Multiple Vitamin (MULTIVITAMIN) tablet Take 1 tablet by mouth daily.  Marland Kitchen. oxyCODONE-acetaminophen (PERCOCET/ROXICET) 5-325 MG tablet One tid prn pain  . triamcinolone cream (KENALOG) 0.1 % Apply bid prn itching  . triamterene-hydrochlorothiazide (DYAZIDE) 50-25 MG capsule Take 1 capsule by mouth every morning.  Marland Kitchen. VITAMIN E PO Take by mouth daily.      Review of systems complete and found to be negative unless listed above in HPI    Physical exam Blood pressure (!) 143/91, pulse 77, height 5\' 4"  (1.626 m), weight 184 lb (83.5 kg), SpO2 97 %. General: NAD Neck: No JVD, no thyromegaly or thyroid nodule.  Lungs: Clear to auscultation bilaterally with normal respiratory effort. CV: Nondisplaced PMI. Regular rate and rhythm, normal S1/S2, no S3/S4, 1/6 systolic murmur heard at bilateral upper sternal borders.  No peripheral edema.  No carotid bruit.    Abdomen: Soft, nontender, no distention.  Skin: Intact without lesions or rashes.  Neurologic: Alert and oriented x 3.  Psych: Normal affect. Extremities: No clubbing or cyanosis.  HEENT: Normal.   ECG: Most recent ECG reviewed.   Labs: Lab Results  Component Value Date/Time   K 3.7 04/26/2016 03:58 PM   BUN 16 04/26/2016 03:58 PM   CREATININE 0.83 04/26/2016 03:58 PM     Lipids: No results found for: LDLCALC, LDLDIRECT, CHOL, TRIG, HDL      ASSESSMENT AND PLAN:  1. Chest pain:This appears to be more GI related. Risk factors for ischemic heart disease include hypertension and family history of premature coronary artery  disease. However, it appears her father and uncles were smokers and she is not. I will continue to monitor her symptoms.  2. Cardiac murmur:I will order a 2-D echocardiogram with Doppler to evaluate cardiac structure, function, and regional wall motion.  3. Hypertension: Mildly elevated. Will monitor.   Disposition: Follow up in 6 weeks.   Signed: Prentice DockerSuresh Jenel Gierke, M.D., F.A.C.C.  12/17/2016, 8:53 AM

## 2016-12-17 NOTE — Patient Instructions (Signed)
Medication Instructions:  Continue all current medications.  Labwork: none  Testing/Procedures: Your physician has requested that you have an echocardiogram. Echocardiography is a painless test that uses sound waves to create images of your heart. It provides your doctor with information about the size and shape of your heart and how well your heart's chambers and valves are working. This procedure takes approximately one hour. There are no restrictions for this procedure. Office will contact with results via phone or letter.    Follow-Up: 6 weeks   Any Other Special Instructions Will Be Listed Below (If Applicable).  If you need a refill on your cardiac medications before your next appointment, please call your pharmacy.  

## 2016-12-17 NOTE — Telephone Encounter (Signed)
   Echo - chest pain, murmur  Scheduled at [facility] [place] on Jan 02, 2017

## 2016-12-17 NOTE — Addendum Note (Signed)
Addended by: Lesle ChrisHILL, Anaiya Wisinski G on: 12/17/2016 09:19 AM   Modules accepted: Orders

## 2016-12-30 LAB — LIPID PANEL
CHOLESTEROL TOTAL: 169 mg/dL (ref 100–199)
Chol/HDL Ratio: 1.9 ratio (ref 0.0–4.4)
HDL: 91 mg/dL (ref >39)
LDL CALC: 62 mg/dL (ref 0–99)
Triglycerides: 81 mg/dL (ref 0–149)
VLDL CHOLESTEROL CAL: 16 mg/dL (ref 5–40)

## 2016-12-30 LAB — CBC WITH DIFFERENTIAL/PLATELET
BASOS: 1 %
Basophils Absolute: 0 10*3/uL (ref 0.0–0.2)
EOS (ABSOLUTE): 0.3 10*3/uL (ref 0.0–0.4)
Eos: 6 %
Hematocrit: 42.2 % (ref 34.0–46.6)
Hemoglobin: 14.2 g/dL (ref 11.1–15.9)
IMMATURE GRANS (ABS): 0 10*3/uL (ref 0.0–0.1)
IMMATURE GRANULOCYTES: 0 %
LYMPHS: 43 %
Lymphocytes Absolute: 2.3 10*3/uL (ref 0.7–3.1)
MCH: 29.5 pg (ref 26.6–33.0)
MCHC: 33.6 g/dL (ref 31.5–35.7)
MCV: 88 fL (ref 79–97)
MONOS ABS: 0.3 10*3/uL (ref 0.1–0.9)
Monocytes: 6 %
NEUTROS PCT: 44 %
Neutrophils Absolute: 2.4 10*3/uL (ref 1.4–7.0)
PLATELETS: 263 10*3/uL (ref 150–379)
RBC: 4.81 x10E6/uL (ref 3.77–5.28)
RDW: 14.1 % (ref 12.3–15.4)
WBC: 5.3 10*3/uL (ref 3.4–10.8)

## 2016-12-30 LAB — BASIC METABOLIC PANEL
BUN / CREAT RATIO: 23 (ref 9–23)
BUN: 18 mg/dL (ref 6–24)
CHLORIDE: 103 mmol/L (ref 96–106)
CO2: 24 mmol/L (ref 20–29)
CREATININE: 0.79 mg/dL (ref 0.57–1.00)
Calcium: 9.5 mg/dL (ref 8.7–10.2)
GFR calc non Af Amer: 85 mL/min/{1.73_m2} (ref >59)
GFR, EST AFRICAN AMERICAN: 97 mL/min/{1.73_m2} (ref >59)
GLUCOSE: 104 mg/dL — AB (ref 65–99)
Potassium: 4.1 mmol/L (ref 3.5–5.2)
SODIUM: 140 mmol/L (ref 134–144)

## 2016-12-30 LAB — HEPATITIS C ANTIBODY

## 2016-12-30 LAB — HIV ANTIBODY (ROUTINE TESTING W REFLEX): HIV SCREEN 4TH GENERATION: NONREACTIVE

## 2016-12-31 ENCOUNTER — Encounter: Payer: Self-pay | Admitting: Family Medicine

## 2016-12-31 ENCOUNTER — Ambulatory Visit (INDEPENDENT_AMBULATORY_CARE_PROVIDER_SITE_OTHER): Payer: Medicaid Other | Admitting: Family Medicine

## 2016-12-31 VITALS — BP 148/102 | Ht 64.0 in | Wt 179.0 lb

## 2016-12-31 DIAGNOSIS — R229 Localized swelling, mass and lump, unspecified: Secondary | ICD-10-CM | POA: Diagnosis not present

## 2016-12-31 DIAGNOSIS — L03114 Cellulitis of left upper limb: Secondary | ICD-10-CM | POA: Diagnosis not present

## 2016-12-31 DIAGNOSIS — M160 Bilateral primary osteoarthritis of hip: Secondary | ICD-10-CM

## 2016-12-31 DIAGNOSIS — G894 Chronic pain syndrome: Secondary | ICD-10-CM

## 2016-12-31 DIAGNOSIS — I10 Essential (primary) hypertension: Secondary | ICD-10-CM

## 2016-12-31 DIAGNOSIS — G8929 Other chronic pain: Secondary | ICD-10-CM

## 2016-12-31 DIAGNOSIS — M5441 Lumbago with sciatica, right side: Secondary | ICD-10-CM

## 2016-12-31 MED ORDER — TRIAMTERENE-HCTZ 37.5-25 MG PO TABS: 1.0000 | ORAL_TABLET | Freq: Every day | ORAL | 5 refills | Status: DC

## 2016-12-31 MED ORDER — DOXYCYCLINE HYCLATE 100 MG PO TABS: 100.0000 mg | ORAL_TABLET | Freq: Two times a day (BID) | ORAL | 0 refills | Status: DC

## 2016-12-31 MED ORDER — FLUCONAZOLE 150 MG PO TABS: 150.0000 mg | ORAL_TABLET | Freq: Once | ORAL | 0 refills | Status: AC

## 2016-12-31 NOTE — Patient Instructions (Signed)
DASH Eating Plan DASH stands for "Dietary Approaches to Stop Hypertension." The DASH eating plan is a healthy eating plan that has been shown to reduce high blood pressure (hypertension). It may also reduce your risk for type 2 diabetes, heart disease, and stroke. The DASH eating plan may also help with weight loss. What are tips for following this plan? General guidelines  Avoid eating more than 2,300 mg (milligrams) of salt (sodium) a day. If you have hypertension, you may need to reduce your sodium intake to 1,500 mg a day.  Limit alcohol intake to no more than 1 drink a day for nonpregnant women and 2 drinks a day for men. One drink equals 12 oz of beer, 5 oz of wine, or 1 oz of hard liquor.  Work with your health care provider to maintain a healthy body weight or to lose weight. Ask what an ideal weight is for you.  Get at least 30 minutes of exercise that causes your heart to beat faster (aerobic exercise) most days of the week. Activities may include walking, swimming, or biking.  Work with your health care provider or diet and nutrition specialist (dietitian) to adjust your eating plan to your individual calorie needs. Reading food labels  Check food labels for the amount of sodium per serving. Choose foods with less than 5 percent of the Daily Value of sodium. Generally, foods with less than 300 mg of sodium per serving fit into this eating plan.  To find whole grains, look for the word "whole" as the first word in the ingredient list. Shopping  Buy products labeled as "low-sodium" or "no salt added."  Buy fresh foods. Avoid canned foods and premade or frozen meals. Cooking  Avoid adding salt when cooking. Use salt-free seasonings or herbs instead of table salt or sea salt. Check with your health care provider or pharmacist before using salt substitutes.  Do not fry foods. Cook foods using healthy methods such as baking, boiling, grilling, and broiling instead.  Cook with  heart-healthy oils, such as olive, canola, soybean, or sunflower oil. Meal planning   Eat a balanced diet that includes: ? 5 or more servings of fruits and vegetables each day. At each meal, try to fill half of your plate with fruits and vegetables. ? Up to 6-8 servings of whole grains each day. ? Less than 6 oz of lean meat, poultry, or fish each day. A 3-oz serving of meat is about the same size as a deck of cards. One egg equals 1 oz. ? 2 servings of low-fat dairy each day. ? A serving of nuts, seeds, or beans 5 times each week. ? Heart-healthy fats. Healthy fats called Omega-3 fatty acids are found in foods such as flaxseeds and coldwater fish, like sardines, salmon, and mackerel.  Limit how much you eat of the following: ? Canned or prepackaged foods. ? Food that is high in trans fat, such as fried foods. ? Food that is high in saturated fat, such as fatty meat. ? Sweets, desserts, sugary drinks, and other foods with added sugar. ? Full-fat dairy products.  Do not salt foods before eating.  Try to eat at least 2 vegetarian meals each week.  Eat more home-cooked food and less restaurant, buffet, and fast food.  When eating at a restaurant, ask that your food be prepared with less salt or no salt, if possible. What foods are recommended? The items listed may not be a complete list. Talk with your dietitian about what   dietary choices are best for you. Grains Whole-grain or whole-wheat bread. Whole-grain or whole-wheat pasta. Brown rice. Oatmeal. Quinoa. Bulgur. Whole-grain and low-sodium cereals. Pita bread. Low-fat, low-sodium crackers. Whole-wheat flour tortillas. Vegetables Fresh or frozen vegetables (raw, steamed, roasted, or grilled). Low-sodium or reduced-sodium tomato and vegetable juice. Low-sodium or reduced-sodium tomato sauce and tomato paste. Low-sodium or reduced-sodium canned vegetables. Fruits All fresh, dried, or frozen fruit. Canned fruit in natural juice (without  added sugar). Meat and other protein foods Skinless chicken or turkey. Ground chicken or turkey. Pork with fat trimmed off. Fish and seafood. Egg whites. Dried beans, peas, or lentils. Unsalted nuts, nut butters, and seeds. Unsalted canned beans. Lean cuts of beef with fat trimmed off. Low-sodium, lean deli meat. Dairy Low-fat (1%) or fat-free (skim) milk. Fat-free, low-fat, or reduced-fat cheeses. Nonfat, low-sodium ricotta or cottage cheese. Low-fat or nonfat yogurt. Low-fat, low-sodium cheese. Fats and oils Soft margarine without trans fats. Vegetable oil. Low-fat, reduced-fat, or light mayonnaise and salad dressings (reduced-sodium). Canola, safflower, olive, soybean, and sunflower oils. Avocado. Seasoning and other foods Herbs. Spices. Seasoning mixes without salt. Unsalted popcorn and pretzels. Fat-free sweets. What foods are not recommended? The items listed may not be a complete list. Talk with your dietitian about what dietary choices are best for you. Grains Baked goods made with fat, such as croissants, muffins, or some breads. Dry pasta or rice meal packs. Vegetables Creamed or fried vegetables. Vegetables in a cheese sauce. Regular canned vegetables (not low-sodium or reduced-sodium). Regular canned tomato sauce and paste (not low-sodium or reduced-sodium). Regular tomato and vegetable juice (not low-sodium or reduced-sodium). Pickles. Olives. Fruits Canned fruit in a light or heavy syrup. Fried fruit. Fruit in cream or butter sauce. Meat and other protein foods Fatty cuts of meat. Ribs. Fried meat. Bacon. Sausage. Bologna and other processed lunch meats. Salami. Fatback. Hotdogs. Bratwurst. Salted nuts and seeds. Canned beans with added salt. Canned or smoked fish. Whole eggs or egg yolks. Chicken or turkey with skin. Dairy Whole or 2% milk, cream, and half-and-half. Whole or full-fat cream cheese. Whole-fat or sweetened yogurt. Full-fat cheese. Nondairy creamers. Whipped toppings.  Processed cheese and cheese spreads. Fats and oils Butter. Stick margarine. Lard. Shortening. Ghee. Bacon fat. Tropical oils, such as coconut, palm kernel, or palm oil. Seasoning and other foods Salted popcorn and pretzels. Onion salt, garlic salt, seasoned salt, table salt, and sea salt. Worcestershire sauce. Tartar sauce. Barbecue sauce. Teriyaki sauce. Soy sauce, including reduced-sodium. Steak sauce. Canned and packaged gravies. Fish sauce. Oyster sauce. Cocktail sauce. Horseradish that you find on the shelf. Ketchup. Mustard. Meat flavorings and tenderizers. Bouillon cubes. Hot sauce and Tabasco sauce. Premade or packaged marinades. Premade or packaged taco seasonings. Relishes. Regular salad dressings. Where to find more information:  National Heart, Lung, and Blood Institute: www.nhlbi.nih.gov  American Heart Association: www.heart.org Summary  The DASH eating plan is a healthy eating plan that has been shown to reduce high blood pressure (hypertension). It may also reduce your risk for type 2 diabetes, heart disease, and stroke.  With the DASH eating plan, you should limit salt (sodium) intake to 2,300 mg a day. If you have hypertension, you may need to reduce your sodium intake to 1,500 mg a day.  When on the DASH eating plan, aim to eat more fresh fruits and vegetables, whole grains, lean proteins, low-fat dairy, and heart-healthy fats.  Work with your health care provider or diet and nutrition specialist (dietitian) to adjust your eating plan to your individual   calorie needs. This information is not intended to replace advice given to you by your health care provider. Make sure you discuss any questions you have with your health care provider. Document Released: 04/19/2011 Document Revised: 04/23/2016 Document Reviewed: 04/23/2016 Elsevier Interactive Patient Education  2017 Elsevier Inc.  

## 2016-12-31 NOTE — Progress Notes (Addendum)
   Subjective:    Patient ID: Stephanie [name], female    DOB: [birth year], 56 y.o.   MRN: [id number]  Hypertension  This is a chronic problem. Pertinent negatives include no chest pain, headaches or shortness of breath.  Patient states she is here to follow up on her HTN.On Amlodipine 5 mg one daily,dyazide 37 mg one daily. Tries to eat healthy,and does not exercise. She is have blood pressure for years she also has difficult time heaping her weight in check because of her physical condition Review of Systems  Constitutional: Negative for activity change, fatigue and fever.  HENT: Negative for congestion.   Respiratory: Negative for cough, chest tightness and shortness of breath.   Cardiovascular: Negative for chest pain and leg swelling.  Gastrointestinal: Negative for abdominal pain.  Skin: Negative for color change.  Neurological: Negative for headaches.  Psychiatric/Behavioral: Negative for behavioral problems.       Objective:   Physical Exam  Constitutional: She appears well-developed and well-nourished. No distress.  HENT:  Head: Normocephalic and atraumatic.  Eyes: Right eye exhibits no discharge. Left eye exhibits no discharge.  Neck: No tracheal deviation present.  Cardiovascular: Normal rate and regular rhythm.   Murmur heard. Pulmonary/Chest: Effort normal and breath sounds normal. No respiratory distress. She has no wheezes. She has no rales.  Musculoskeletal: She exhibits no edema.  Lymphadenopathy:    She has no cervical adenopathy.  Neurological: She is alert. She exhibits normal muscle tone.  Skin: Skin is warm and dry. No erythema.  Psychiatric: Her behavior is normal.  Vitals reviewed.  Patient does have a murmur she is already seen cardiology they will be doing a echo       Assessment & Plan:  Hypertension-blood pressure elevated she is just started back on her medicine she is a take her medicine she will follow-up in several weeks to recheck may need  adjustment again has hip surgery in the fall  I did write her prescription for a walker with wheels but I recommended against a lift chair  She has a nodule on her left arm with cellulitis will go ahead and treat with antibiotics in we'll recheck that nodule in several weeks' time may need biopsy if still present  Chronic pain syndrome 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

## 2017-01-01 ENCOUNTER — Telehealth: Payer: Self-pay | Admitting: Family Medicine

## 2017-01-01 ENCOUNTER — Telehealth: Payer: Self-pay | Admitting: *Deleted

## 2017-01-01 NOTE — Telephone Encounter (Signed)
The form was completed she qualified for Federal disability, she is completely and totally disabled permanently

## 2017-01-01 NOTE — Telephone Encounter (Signed)
Confusing message-yesterday when I saw the patient I prescribed a 37-25 dosing they should be able to get that

## 2017-01-01 NOTE — Telephone Encounter (Signed)
Patient dropped off form at front desk at her apointment. Please review form and fill in highlighted areas.Date/sign. Left in your yellow folder.

## 2017-01-01 NOTE — Telephone Encounter (Signed)
Fax frp, [facility] [place]. Alternative requested: pharm cant get triamterene 50-25. They are requesting rx for different strength.

## 2017-01-01 NOTE — Telephone Encounter (Signed)
The new dose was sent to pharmacy yesterday

## 2017-01-02 ENCOUNTER — Ambulatory Visit (INDEPENDENT_AMBULATORY_CARE_PROVIDER_SITE_OTHER): Payer: BLUE CROSS/BLUE SHIELD

## 2017-01-02 ENCOUNTER — Other Ambulatory Visit: Payer: Self-pay

## 2017-01-02 DIAGNOSIS — R079 Chest pain, unspecified: Secondary | ICD-10-CM

## 2017-01-02 DIAGNOSIS — R011 Cardiac murmur, unspecified: Secondary | ICD-10-CM | POA: Diagnosis not present

## 2017-01-03 ENCOUNTER — Other Ambulatory Visit: Payer: Self-pay | Admitting: Family Medicine

## 2017-01-03 NOTE — Telephone Encounter (Signed)
Last seen 12/31/16

## 2017-01-04 ENCOUNTER — Other Ambulatory Visit: Payer: Self-pay | Admitting: Family Medicine

## 2017-01-08 ENCOUNTER — Telehealth: Payer: Self-pay | Admitting: *Deleted

## 2017-01-08 NOTE — Telephone Encounter (Signed)
Notes recorded by Lesle Chris, LPN on 5/92/9244 at 3:36 PM EDT Patient notified. Copy to pmd. Follow up scheduled for 01/10/2017 with Dr. Purvis Sheffield. ------  Notes recorded by Laqueta Linden, MD on 01/03/2017 at 4:08 PM EDT Vigorous pumping function.

## 2017-01-09 ENCOUNTER — Telehealth: Payer: Self-pay | Admitting: *Deleted

## 2017-01-09 ENCOUNTER — Encounter: Payer: Self-pay | Admitting: Family Medicine

## 2017-01-09 DIAGNOSIS — G894 Chronic pain syndrome: Secondary | ICD-10-CM | POA: Insufficient documentation

## 2017-01-09 NOTE — Telephone Encounter (Signed)
See letter in your folder. Medicaid did not approve request for Oxycodone.

## 2017-01-09 NOTE — Telephone Encounter (Signed)
Nurse's-I didn't appeal letter-please pregnant please submit it also submit the most recent documentation from August 20 if the patient should inquire we are doing the best we can with the difficult bureaucracy

## 2017-01-10 ENCOUNTER — Ambulatory Visit (INDEPENDENT_AMBULATORY_CARE_PROVIDER_SITE_OTHER): Payer: BLUE CROSS/BLUE SHIELD | Admitting: Cardiovascular Disease

## 2017-01-10 ENCOUNTER — Encounter: Payer: Self-pay | Admitting: Cardiovascular Disease

## 2017-01-10 VITALS — BP 130/98 | HR 78 | Ht 64.0 in | Wt 180.0 lb

## 2017-01-10 DIAGNOSIS — R011 Cardiac murmur, unspecified: Secondary | ICD-10-CM | POA: Diagnosis not present

## 2017-01-10 DIAGNOSIS — R079 Chest pain, unspecified: Secondary | ICD-10-CM

## 2017-01-10 DIAGNOSIS — I10 Essential (primary) hypertension: Secondary | ICD-10-CM

## 2017-01-10 MED ORDER — AMLODIPINE BESYLATE 10 MG PO TABS: 10.0000 mg | ORAL_TABLET | Freq: Every day | ORAL | 3 refills | Status: DC

## 2017-01-10 NOTE — Patient Instructions (Addendum)
Medication Instructions:   Increase Amlodipine to 10mg  daily.  Continue all other medications.    Labwork: none  Testing/Procedures: none  Follow-Up: 4 months   Any Other Special Instructions Will Be Listed Below (If Applicable).  If you need a refill on your cardiac medications before your next appointment, please call your pharmacy.

## 2017-01-10 NOTE — Telephone Encounter (Signed)
Letter and Office notes being faxed over to [facility] [facility].

## 2017-01-10 NOTE — Progress Notes (Signed)
SUBJECTIVE: The patient returns for follow-up after undergoing cardiovascular testing performed for the evaluation of chest pain and murmur.  Echo 01/02/17:  - Left ventricle: The cavity size was normal. Wall thickness was   increased in a pattern of moderate LVH. Systolic function was   vigorous. The estimated ejection fraction was in the range of 65%   to 70%. Wall motion was normal; there were no regional wall   motion abnormalities. Doppler parameters are consistent with   abnormal left ventricular relaxation (grade 1 diastolic   dysfunction).  Since her last visit with me she had one episode of left inframammary chest pain lasting less than a minute. It was not associated with shortness of breath.  Blood pressure today is 130/98.   Review of Systems: As per "subjective", otherwise negative.  Allergies  Allergen Reactions  . Aspirin Swelling  . Gabapentin     drowsy  . Hydrocodone     Itching     Current Outpatient Prescriptions  Medication Sig Dispense Refill  . albuterol (PROVENTIL HFA) 108 (90 Base) MCG/ACT inhaler Inhale two puffs every four to six hours as needed for cough or wheeze.    Marland Kitchen. amLODipine (NORVASC) 5 MG tablet Take 1 tablet (5 mg total) by mouth daily. 30 tablet 11  . CALCIUM PO Take by mouth daily.    . chlorzoxazone (PARAFON) 500 MG tablet TAKE 1 TABLET (500 MG TOTAL) BY MOUTH 3 (THREE) TIMES DAILY AS NEEDED FOR MUSCLE SPASMS. 30 tablet 0  . citalopram (CELEXA) 20 MG tablet One half tablet qam for 7 days, then one tablet qam 30 tablet 5  . doxycycline (VIBRA-TABS) 100 MG tablet Take 1 tablet (100 mg total) by mouth 2 (two) times daily. 20 tablet 0  . fluticasone furoate-vilanterol (BREO ELLIPTA) 200-25 MCG/INH AEPB Inhale 1 puff into the lungs daily. Rinse, gargle, and spit after use. 60 each 5  . ketoconazole (NIZORAL) 2 % cream Apply to rash bid for 3 weeks 30 g 3  . montelukast (SINGULAIR) 10 MG tablet Take one tablet once daily. 30 tablet 5    . Multiple Vitamin (MULTIVITAMIN) tablet Take 1 tablet by mouth daily.    Marland Kitchen. oxyCODONE-acetaminophen (PERCOCET/ROXICET) 5-325 MG tablet One tid prn pain 90 tablet 0  . triamcinolone cream (KENALOG) 0.1 % Apply bid prn itching 45 g 4  . triamterene-hydrochlorothiazide (MAXZIDE-25) 37.5-25 MG tablet Take 1 tablet by mouth daily. 30 tablet 5  . VITAMIN E PO Take by mouth daily.     No current facility-administered medications for this visit.     Past Medical History:  Diagnosis Date  . Allergic rhinitis   . Asthma     Past Surgical History:  Procedure Laterality Date  . CESAREAN SECTION  1985, 1989, 1995    Social History   Social History  . Marital status: Divorced    Spouse name: N/A  . Number of children: N/A  . Years of education: N/A   Occupational History  . Not on file.   Social History Main Topics  . Smoking status: Former Smoker    Packs/day: 0.25    Years: 18.00    Types: Cigarettes    Start date: 09/24/1994    Quit date: 03/06/2013  . Smokeless tobacco: Never Used  . Alcohol use Not on file  . Drug use: Unknown  . Sexual activity: Not on file   Other Topics Concern  . Not on file   Social History Narrative  .  No narrative on file     Vitals:   01/10/17 1338  BP: (!) 130/98  Pulse: 78  SpO2: 97%  Weight: 180 lb (81.6 kg)  Height: 5\' 4"  (1.626 m)    Wt Readings from Last 3 Encounters:  01/10/17 180 lb (81.6 kg)  12/31/16 179 lb 0.4 oz (81.2 kg)  12/17/16 184 lb (83.5 kg)     PHYSICAL EXAM General: NAD HEENT: Normal. Neck: No JVD, no thyromegaly. Lungs: Clear to auscultation bilaterally with normal respiratory effort. CV: Nondisplaced PMI.  Regular rate and rhythm, normal S1/S2, no S3/S4, 1/6 systolic murmur heard at bilateral upper sternal borders. No pretibial or periankle edema.   Abdomen: Soft, nontender, no distention.  Neurologic: Alert and oriented.  Psych: Normal affect. Skin: Normal. Musculoskeletal: No gross  deformities.    ECG: Most recent ECG reviewed.   Labs: Lab Results  Component Value Date/Time   K 4.1 12/29/2016 11:23 AM   BUN 18 12/29/2016 11:23 AM   CREATININE 0.79 12/29/2016 11:23 AM   HGB 14.2 12/29/2016 11:23 AM     Lipids: Lab Results  Component Value Date/Time   LDLCALC 62 12/29/2016 11:23 AM   CHOL 169 12/29/2016 11:23 AM   TRIG 81 12/29/2016 11:23 AM   HDL 91 12/29/2016 11:23 AM       ASSESSMENT AND PLAN:  1. Chest pain: Symptomatically stable. Symptoms appear to be more GI related. Risk factors for ischemic heart disease include hypertension and family history of premature coronary artery disease. However, it appears her father and uncles were smokers and she is not. I will continue to monitor her symptoms.  2. Cardiac murmur: Outflow tract murmur due to vigorous LV systolic function.  3. Hypertension: Mildly elevated. Will increase amlodipine to 10 mg daily.     Disposition: Follow up December 2018.   Prentice Docker, M.D., F.A.C.C.

## 2017-01-15 DIAGNOSIS — Z029 Encounter for administrative examinations, unspecified: Secondary | ICD-10-CM

## 2017-01-17 ENCOUNTER — Ambulatory Visit: Payer: BLUE CROSS/BLUE SHIELD | Admitting: Cardiovascular Disease

## 2017-01-23 ENCOUNTER — Ambulatory Visit (INDEPENDENT_AMBULATORY_CARE_PROVIDER_SITE_OTHER): Payer: Self-pay | Admitting: Family Medicine

## 2017-01-23 ENCOUNTER — Encounter: Payer: Self-pay | Admitting: Family Medicine

## 2017-01-23 VITALS — BP 112/68 | Temp 98.1°F | Ht 64.0 in | Wt 179.0 lb

## 2017-01-23 DIAGNOSIS — I10 Essential (primary) hypertension: Secondary | ICD-10-CM

## 2017-01-23 MED ORDER — OXYCODONE-ACETAMINOPHEN 5-325 MG PO TABS: ORAL_TABLET | ORAL | 0 refills | Status: DC

## 2017-01-23 NOTE — Progress Notes (Signed)
   Subjective:    Patient ID: Stephanie [name], female    DOB: [birth year], 56 y.o.   MRN: [id number]  HPIFollow up cellulitis on left arm. Finished doxy.  An area of cellulitis on the arm actually doing much better now the nodule on the redness went down no abscess denies any other particular troubles did see cardiology they increased her amlodipine she states she's doing much better with her blood pressure denies chest tightness pressure pain Pt states no concerns today.     Review of Systems  Constitutional: Negative for activity change, fatigue and fever.  HENT: Negative for congestion.   Respiratory: Negative for cough, chest tightness and shortness of breath.   Cardiovascular: Negative for chest pain and leg swelling.  Gastrointestinal: Negative for abdominal pain.  Skin: Negative for color change.  Neurological: Negative for headaches.  Psychiatric/Behavioral: Negative for behavioral problems.       Objective:   Physical Exam  Constitutional: She appears well-developed and well-nourished. No distress.  HENT:  Head: Normocephalic and atraumatic.  Eyes: Right eye exhibits no discharge. Left eye exhibits no discharge.  Neck: No tracheal deviation present.  Cardiovascular: Normal rate, regular rhythm and normal heart sounds.   No murmur heard. Pulmonary/Chest: Effort normal and breath sounds normal. No respiratory distress. She has no wheezes. She has no rales.  Musculoskeletal: She exhibits no edema.  Lymphadenopathy:    She has no cervical adenopathy.  Neurological: She is alert. She exhibits normal muscle tone.  Skin: Skin is warm and dry. No erythema.  Psychiatric: Her behavior is normal.  Vitals reviewed.         Assessment & Plan:  Cellulitis left arm healed up very small skin spot noted no sign of any type of cancer recheck this again in several months  Blood pressure good control with current medication. Approved for surgery  I did encourage patient to discuss  with her orthopedist ways to prevent blood clots and infections.

## 2017-01-23 NOTE — Patient Instructions (Signed)
DASH Eating Plan DASH stands for "Dietary Approaches to Stop Hypertension." The DASH eating plan is a healthy eating plan that has been shown to reduce high blood pressure (hypertension). It may also reduce your risk for type 2 diabetes, heart disease, and stroke. The DASH eating plan may also help with weight loss. What are tips for following this plan? General guidelines  Avoid eating more than 2,300 mg (milligrams) of salt (sodium) a day. If you have hypertension, you may need to reduce your sodium intake to 1,500 mg a day.  Limit alcohol intake to no more than 1 drink a day for nonpregnant women and 2 drinks a day for men. One drink equals 12 oz of beer, 5 oz of wine, or 1 oz of hard liquor.  Work with your health care provider to maintain a healthy body weight or to lose weight. Ask what an ideal weight is for you.  Get at least 30 minutes of exercise that causes your heart to beat faster (aerobic exercise) most days of the week. Activities may include walking, swimming, or biking.  Work with your health care provider or diet and nutrition specialist (dietitian) to adjust your eating plan to your individual calorie needs. Reading food labels  Check food labels for the amount of sodium per serving. Choose foods with less than 5 percent of the Daily Value of sodium. Generally, foods with less than 300 mg of sodium per serving fit into this eating plan.  To find whole grains, look for the word "whole" as the first word in the ingredient list. Shopping  Buy products labeled as "low-sodium" or "no salt added."  Buy fresh foods. Avoid canned foods and premade or frozen meals. Cooking  Avoid adding salt when cooking. Use salt-free seasonings or herbs instead of table salt or sea salt. Check with your health care provider or pharmacist before using salt substitutes.  Do not fry foods. Cook foods using healthy methods such as baking, boiling, grilling, and broiling instead.  Cook with  heart-healthy oils, such as olive, canola, soybean, or sunflower oil. Meal planning   Eat a balanced diet that includes: ? 5 or more servings of fruits and vegetables each day. At each meal, try to fill half of your plate with fruits and vegetables. ? Up to 6-8 servings of whole grains each day. ? Less than 6 oz of lean meat, poultry, or fish each day. A 3-oz serving of meat is about the same size as a deck of cards. One egg equals 1 oz. ? 2 servings of low-fat dairy each day. ? A serving of nuts, seeds, or beans 5 times each week. ? Heart-healthy fats. Healthy fats called Omega-3 fatty acids are found in foods such as flaxseeds and coldwater fish, like sardines, salmon, and mackerel.  Limit how much you eat of the following: ? Canned or prepackaged foods. ? Food that is high in trans fat, such as fried foods. ? Food that is high in saturated fat, such as fatty meat. ? Sweets, desserts, sugary drinks, and other foods with added sugar. ? Full-fat dairy products.  Do not salt foods before eating.  Try to eat at least 2 vegetarian meals each week.  Eat more home-cooked food and less restaurant, buffet, and fast food.  When eating at a restaurant, ask that your food be prepared with less salt or no salt, if possible. What foods are recommended? The items listed may not be a complete list. Talk with your dietitian about what   dietary choices are best for you. Grains Whole-grain or whole-wheat bread. Whole-grain or whole-wheat pasta. Brown rice. Oatmeal. Quinoa. Bulgur. Whole-grain and low-sodium cereals. Pita bread. Low-fat, low-sodium crackers. Whole-wheat flour tortillas. Vegetables Fresh or frozen vegetables (raw, steamed, roasted, or grilled). Low-sodium or reduced-sodium tomato and vegetable juice. Low-sodium or reduced-sodium tomato sauce and tomato paste. Low-sodium or reduced-sodium canned vegetables. Fruits All fresh, dried, or frozen fruit. Canned fruit in natural juice (without  added sugar). Meat and other protein foods Skinless chicken or turkey. Ground chicken or turkey. Pork with fat trimmed off. Fish and seafood. Egg whites. Dried beans, peas, or lentils. Unsalted nuts, nut butters, and seeds. Unsalted canned beans. Lean cuts of beef with fat trimmed off. Low-sodium, lean deli meat. Dairy Low-fat (1%) or fat-free (skim) milk. Fat-free, low-fat, or reduced-fat cheeses. Nonfat, low-sodium ricotta or cottage cheese. Low-fat or nonfat yogurt. Low-fat, low-sodium cheese. Fats and oils Soft margarine without trans fats. Vegetable oil. Low-fat, reduced-fat, or light mayonnaise and salad dressings (reduced-sodium). Canola, safflower, olive, soybean, and sunflower oils. Avocado. Seasoning and other foods Herbs. Spices. Seasoning mixes without salt. Unsalted popcorn and pretzels. Fat-free sweets. What foods are not recommended? The items listed may not be a complete list. Talk with your dietitian about what dietary choices are best for you. Grains Baked goods made with fat, such as croissants, muffins, or some breads. Dry pasta or rice meal packs. Vegetables Creamed or fried vegetables. Vegetables in a cheese sauce. Regular canned vegetables (not low-sodium or reduced-sodium). Regular canned tomato sauce and paste (not low-sodium or reduced-sodium). Regular tomato and vegetable juice (not low-sodium or reduced-sodium). Pickles. Olives. Fruits Canned fruit in a light or heavy syrup. Fried fruit. Fruit in cream or butter sauce. Meat and other protein foods Fatty cuts of meat. Ribs. Fried meat. Bacon. Sausage. Bologna and other processed lunch meats. Salami. Fatback. Hotdogs. Bratwurst. Salted nuts and seeds. Canned beans with added salt. Canned or smoked fish. Whole eggs or egg yolks. Chicken or turkey with skin. Dairy Whole or 2% milk, cream, and half-and-half. Whole or full-fat cream cheese. Whole-fat or sweetened yogurt. Full-fat cheese. Nondairy creamers. Whipped toppings.  Processed cheese and cheese spreads. Fats and oils Butter. Stick margarine. Lard. Shortening. Ghee. Bacon fat. Tropical oils, such as coconut, palm kernel, or palm oil. Seasoning and other foods Salted popcorn and pretzels. Onion salt, garlic salt, seasoned salt, table salt, and sea salt. Worcestershire sauce. Tartar sauce. Barbecue sauce. Teriyaki sauce. Soy sauce, including reduced-sodium. Steak sauce. Canned and packaged gravies. Fish sauce. Oyster sauce. Cocktail sauce. Horseradish that you find on the shelf. Ketchup. Mustard. Meat flavorings and tenderizers. Bouillon cubes. Hot sauce and Tabasco sauce. Premade or packaged marinades. Premade or packaged taco seasonings. Relishes. Regular salad dressings. Where to find more information:  National Heart, Lung, and Blood Institute: www.nhlbi.nih.gov  American Heart Association: www.heart.org Summary  The DASH eating plan is a healthy eating plan that has been shown to reduce high blood pressure (hypertension). It may also reduce your risk for type 2 diabetes, heart disease, and stroke.  With the DASH eating plan, you should limit salt (sodium) intake to 2,300 mg a day. If you have hypertension, you may need to reduce your sodium intake to 1,500 mg a day.  When on the DASH eating plan, aim to eat more fresh fruits and vegetables, whole grains, lean proteins, low-fat dairy, and heart-healthy fats.  Work with your health care provider or diet and nutrition specialist (dietitian) to adjust your eating plan to your individual   calorie needs. This information is not intended to replace advice given to you by your health care provider. Make sure you discuss any questions you have with your health care provider. Document Released: 04/19/2011 Document Revised: 04/23/2016 Document Reviewed: 04/23/2016 Elsevier Interactive Patient Education  2017 Elsevier Inc.  

## 2017-01-28 ENCOUNTER — Telehealth: Payer: Self-pay | Admitting: Allergy and Immunology

## 2017-01-28 NOTE — Telephone Encounter (Signed)
Per patient she advises that she does have some itching with watches and other metals and thinks she needs testing.  Advised her to find out what metals or materials surgeon will be using and she can call back and schedule if he MD advises she should have same done

## 2017-01-28 NOTE — Telephone Encounter (Signed)
L./M for patient to call. If she has not issue in past we would not have done patch test for same.  So therefore we cannot advise if she is allergic to metals. Per Dr Lucie Leather if she has no type of problems concerning metals in past probably no reason to test.  If she has had problems in past or concerns she would need to come in to get tested prior to surgery.

## 2017-01-28 NOTE — Telephone Encounter (Signed)
Pt called and said that she was having surgery on oct 3 and needs to know if she has a allergy to copper or metal  [phone]

## 2017-01-31 ENCOUNTER — Ambulatory Visit: Payer: Self-pay | Admitting: Orthopedic Surgery

## 2017-02-05 ENCOUNTER — Ambulatory Visit (INDEPENDENT_AMBULATORY_CARE_PROVIDER_SITE_OTHER): Payer: BLUE CROSS/BLUE SHIELD | Admitting: Allergy and Immunology

## 2017-02-05 ENCOUNTER — Encounter: Payer: Self-pay | Admitting: Allergy and Immunology

## 2017-02-05 VITALS — BP 120/82 | HR 81 | Resp 19

## 2017-02-05 DIAGNOSIS — L230 Allergic contact dermatitis due to metals: Secondary | ICD-10-CM

## 2017-02-05 NOTE — Progress Notes (Signed)
Stephanie [name] presents to this clinic to have metal patch testing performed. She will be undergoing a hip replacement sometime in the near future and she has a history of developing red itchy contact areas whenever she wears any type of jewelry. She does not wear jewelry for this reason. Metal skin testing was placed. She will return to this clinic and 48-72 hours for initial read.

## 2017-02-05 NOTE — Patient Instructions (Addendum)
Stephanie [name]  02/05/2017   Your procedure is scheduled on: 02/13/2017    Report to [facility] Main  Entrance   Report to admitting at   130pm Follow signs to Short Stay on first floor at AM  Call this number if you have problems the morning of surgery  [phone]   Remember: ONLY 1 PERSON MAY GO WITH YOU TO SHORT STAY TO GET  READY MORNING OF YOUR SURGERY.  Do not eat food  After midnite.  May have clear liquids from 12 midnite until 0930am morning of surgery then nothing by mouth.      Take these medicines the morning of surgery with A SIP OF WATER: use inhalers as usual and bring, , Amlodipine ( NOrvasc), Celexa                                 You may not have any metal on your body including hair pins and              piercings  Do not wear jewelry, make-up, lotions, powders or perfumes, deodorant             Do not wear nail polish.  Do not shave  48 hours prior to surgery.                Do not bring valuables to the hospital. [facility] IS NOT             RESPONSIBLE   FOR VALUABLES.  Contacts, dentures or bridgework may not be worn into surgery.  Leave suitcase in the car. After surgery it may be brought to your room.                       Please read over the following fact sheets you were given: _____________________________________________________________________                CLEAR LIQUID DIET   Foods Allowed                                                                     Foods Excluded  Coffee and tea, regular and decaf                             liquids that you cannot  Plain Jell-O in any flavor                                             see through such as: Fruit ices (not with fruit pulp)                                     milk, soups, orange juice  Iced Popsicles  All solid food Carbonated beverages, regular and diet                                    Cranberry, grape and apple  juices Sports drinks like Gatorade Lightly seasoned clear broth or consume(fat free) Sugar, honey syrup  Sample Menu Breakfast                                Lunch                                     Supper Cranberry juice                    Beef broth                            Chicken broth Jell-O                                     Grape juice                           Apple juice Coffee or tea                        Jell-O                                      Popsicle                                                Coffee or tea                        Coffee or tea  _____________________________________________________________________  [facility] - Preparing for Surgery Before surgery, you can play an important role.  Because skin is not sterile, your skin needs to be as free of germs as possible.  You can reduce the number of germs on your skin by washing with CHG (chlorahexidine gluconate) soap before surgery.  CHG is an antiseptic cleaner which kills germs and bonds with the skin to continue killing germs even after washing. Please DO NOT use if you have an allergy to CHG or antibacterial soaps.  If your skin becomes reddened/irritated stop using the CHG and inform your nurse when you arrive at Short Stay. Do not shave (including legs and underarms) for at least 48 hours prior to the first CHG shower.  You may shave your face/neck. Please follow these instructions carefully:  1.  Shower with CHG Soap the night before surgery and the  morning of Surgery.  2.  If you choose to wash your hair, wash your hair first as usual with your  normal  shampoo.  3.  After you shampoo, rinse your hair and body thoroughly to remove the  shampoo.  4.  Use CHG as you would any other liquid soap.  You can apply chg directly  to the skin and wash                       Gently with a scrungie or clean washcloth.  5.  Apply the CHG Soap to your body ONLY FROM THE NECK DOWN.   Do not  use on face/ open                           Wound or open sores. Avoid contact with eyes, ears mouth and genitals (private parts).                       Wash face,  Genitals (private parts) with your normal soap.             6.  Wash thoroughly, paying special attention to the area where your surgery  will be performed.  7.  Thoroughly rinse your body with warm water from the neck down.  8.  DO NOT shower/wash with your normal soap after using and rinsing off  the CHG Soap.                9.  Pat yourself dry with a clean towel.            10.  Wear clean pajamas.            11.  Place clean sheets on your bed the night of your first shower and do not  sleep with pets. Day of Surgery : Do not apply any lotions/deodorants the morning of surgery.  Please wear clean clothes to the hospital/surgery center.  FAILURE TO FOLLOW THESE INSTRUCTIONS MAY RESULT IN THE CANCELLATION OF YOUR SURGERY PATIENT SIGNATURE_________________________________  NURSE SIGNATURE__________________________________  ________________________________________________________________________  WHAT IS A BLOOD TRANSFUSION? Blood Transfusion Information  A transfusion is the replacement of blood or some of its parts. Blood is made up of multiple cells which provide different functions.  Red blood cells carry oxygen and are used for blood loss replacement.  White blood cells fight against infection.  Platelets control bleeding.  Plasma helps clot blood.  Other blood products are available for specialized needs, such as hemophilia or other clotting disorders. BEFORE THE TRANSFUSION  Who gives blood for transfusions?   Healthy volunteers who are fully evaluated to make sure their blood is safe. This is blood bank blood. Transfusion therapy is the safest it has ever been in the practice of medicine. Before blood is taken from a donor, a complete history is taken to make sure that person has no history of diseases nor  engages in risky social behavior (examples are intravenous drug use or sexual activity with multiple partners). The donor's travel history is screened to minimize risk of transmitting infections, such as malaria. The donated blood is tested for signs of infectious diseases, such as HIV and hepatitis. The blood is then tested to be sure it is compatible with you in order to minimize the chance of a transfusion reaction. If you or a relative donates blood, this is often done in anticipation of surgery and is not appropriate for emergency situations. It takes many days to process the donated blood. RISKS AND COMPLICATIONS Although transfusion therapy is very safe and saves many lives, the main dangers of transfusion include:   Getting an infectious disease.  Developing a transfusion reaction.  This is an allergic reaction to something in the blood you were given. Every precaution is taken to prevent this. The decision to have a blood transfusion has been considered carefully by your caregiver before blood is given. Blood is not given unless the benefits outweigh the risks. AFTER THE TRANSFUSION  Right after receiving a blood transfusion, you will usually feel much better and more energetic. This is especially true if your red blood cells have gotten low (anemic). The transfusion raises the level of the red blood cells which carry oxygen, and this usually causes an energy increase.  The nurse administering the transfusion will monitor you carefully for complications. HOME CARE INSTRUCTIONS  No special instructions are needed after a transfusion. You may find your energy is better. Speak with your caregiver about any limitations on activity for underlying diseases you may have. SEEK MEDICAL CARE IF:   Your condition is not improving after your transfusion.  You develop redness or irritation at the intravenous (IV) site. SEEK IMMEDIATE MEDICAL CARE IF:  Any of the following symptoms occur over the next  12 hours:  Shaking chills.  You have a temperature by mouth above 102 F (38.9 C), not controlled by medicine.  Chest, back, or muscle pain.  People around you feel you are not acting correctly or are confused.  Shortness of breath or difficulty breathing.  Dizziness and fainting.  You get a rash or develop hives.  You have a decrease in urine output.  Your urine turns a dark color or changes to pink, red, or brown. Any of the following symptoms occur over the next 10 days:  You have a temperature by mouth above 102 F (38.9 C), not controlled by medicine.  Shortness of breath.  Weakness after normal activity.  The white part of the eye turns yellow (jaundice).  You have a decrease in the amount of urine or are urinating less often.  Your urine turns a dark color or changes to pink, red, or brown. Document Released: 04/27/2000 Document Revised: 07/23/2011 Document Reviewed: 12/15/2007 ExitCare Patient Information 2014 [place].  _______________________________________________________________________  Incentive Spirometer  An incentive spirometer is a tool that can help keep your lungs clear and active. This tool measures how well you are filling your lungs with each breath. Taking long deep breaths may help reverse or decrease the chance of developing breathing (pulmonary) problems (especially infection) following:  A long period of time when you are unable to move or be active. BEFORE THE PROCEDURE   If the spirometer includes an indicator to show your best effort, your nurse or respiratory therapist will set it to a desired goal.  If possible, sit up straight or lean slightly forward. Try not to slouch.  Hold the incentive spirometer in an upright position. INSTRUCTIONS FOR USE  1. Sit on the edge of your bed if possible, or sit up as far as you can in bed or on a chair. 2. Hold the incentive spirometer in an upright position. 3. Breathe out  normally. 4. Place the mouthpiece in your mouth and seal your lips tightly around it. 5. Breathe in slowly and as deeply as possible, raising the piston or the ball toward the top of the column. 6. Hold your breath for 3-5 seconds or for as long as possible. Allow the piston or ball to fall to the bottom of the column. 7. Remove the mouthpiece from your mouth and breathe out normally. 8. Rest for a few seconds and repeat Steps  1 through 7 at least 10 times every 1-2 hours when you are awake. Take your time and take a few normal breaths between deep breaths. 9. The spirometer may include an indicator to show your best effort. Use the indicator as a goal to work toward during each repetition. 10. After each set of 10 deep breaths, practice coughing to be sure your lungs are clear. If you have an incision (the cut made at the time of surgery), support your incision when coughing by placing a pillow or rolled up towels firmly against it. Once you are able to get out of bed, walk around indoors and cough well. You may stop using the incentive spirometer when instructed by your caregiver.  RISKS AND COMPLICATIONS  Take your time so you do not get dizzy or light-headed.  If you are in pain, you may need to take or ask for pain medication before doing incentive spirometry. It is harder to take a deep breath if you are having pain. AFTER USE  Rest and breathe slowly and easily.  It can be helpful to keep track of a log of your progress. Your caregiver can provide you with a simple table to help with this. If you are using the spirometer at home, follow these instructions: [place] IF:   You are having difficultly using the spirometer.  You have trouble using the spirometer as often as instructed.  Your pain medication is not giving enough relief while using the spirometer.  You develop fever of 100.5 F (38.1 C) or higher. SEEK IMMEDIATE MEDICAL CARE IF:   You cough up bloody sputum  that had not been present before.  You develop fever of 102 F (38.9 C) or greater.  You develop worsening pain at or near the incision site. MAKE SURE YOU:   Understand these instructions.  Will watch your condition.  Will get help right away if you are not doing well or get worse. Document Released: 09/10/2006 Document Revised: 07/23/2011 Document Reviewed: 11/11/2006 [facility] Patient Information 2014 [place], [place].   ________________________________________________________________________

## 2017-02-06 ENCOUNTER — Encounter (HOSPITAL_COMMUNITY)
Admission: RE | Admit: 2017-02-06 | Discharge: 2017-02-06 | Disposition: A | Discharge status: Home / Self Care | Payer: BLUE CROSS/BLUE SHIELD | Source: Ambulatory Visit | Attending: Orthopedic Surgery | Admitting: Orthopedic Surgery

## 2017-02-06 ENCOUNTER — Encounter (HOSPITAL_COMMUNITY): Payer: Self-pay

## 2017-02-06 DIAGNOSIS — M1611 Unilateral primary osteoarthritis, right hip: Secondary | ICD-10-CM | POA: Insufficient documentation

## 2017-02-06 DIAGNOSIS — Z01812 Encounter for preprocedural laboratory examination: Secondary | ICD-10-CM | POA: Insufficient documentation

## 2017-02-06 HISTORY — DX: Essential (primary) hypertension: I10

## 2017-02-06 HISTORY — DX: Depression, unspecified: F32.A

## 2017-02-06 HISTORY — DX: Unspecified osteoarthritis, unspecified site: M19.90

## 2017-02-06 HISTORY — DX: Pneumonia, unspecified organism: J18.9

## 2017-02-06 HISTORY — DX: Major depressive disorder, single episode, unspecified: F32.9

## 2017-02-06 HISTORY — DX: Cardiac murmur, unspecified: R01.1

## 2017-02-06 LAB — COMPREHENSIVE METABOLIC PANEL
ALBUMIN: 4.1 g/dL (ref 3.5–5.0)
ALT: 18 U/L (ref 14–54)
AST: 20 U/L (ref 15–41)
Alkaline Phosphatase: 70 U/L (ref 38–126)
Anion gap: 11 (ref 5–15)
BUN: 17 mg/dL (ref 6–20)
CHLORIDE: 104 mmol/L (ref 101–111)
CO2: 29 mmol/L (ref 22–32)
CREATININE: 1.16 mg/dL — AB (ref 0.44–1.00)
Calcium: 9.3 mg/dL (ref 8.9–10.3)
GFR calc non Af Amer: 52 mL/min — ABNORMAL LOW (ref >60)
GLUCOSE: 91 mg/dL (ref 65–99)
Potassium: 3.6 mmol/L (ref 3.5–5.1)
SODIUM: 144 mmol/L (ref 135–145)
Total Bilirubin: 0.7 mg/dL (ref 0.3–1.2)
Total Protein: 7.5 g/dL (ref 6.5–8.1)

## 2017-02-06 LAB — CBC
HCT: 41.3 % (ref 36.0–46.0)
Hemoglobin: 13.9 g/dL (ref 12.0–15.0)
MCH: 30.1 pg (ref 26.0–34.0)
MCHC: 33.7 g/dL (ref 30.0–36.0)
MCV: 89.4 fL (ref 78.0–100.0)
PLATELETS: 250 10*3/uL (ref 150–400)
RBC: 4.62 MIL/uL (ref 3.87–5.11)
RDW: 14.2 % (ref 11.5–15.5)
WBC: 6 10*3/uL (ref 4.0–10.5)

## 2017-02-06 LAB — ABO/RH: ABO/RH(D): O POS

## 2017-02-06 LAB — SURGICAL PCR SCREEN
MRSA, PCR: NEGATIVE
Staphylococcus aureus: NEGATIVE

## 2017-02-06 LAB — HCG, SERUM, QUALITATIVE: PREG SERUM: NEGATIVE

## 2017-02-06 LAB — PROTIME-INR
INR: 0.92
Prothrombin Time: 12.3 seconds (ref 11.4–15.2)

## 2017-02-06 LAB — APTT: APTT: 32 s (ref 24–36)

## 2017-02-06 NOTE — Progress Notes (Signed)
ekg-11/26/16-epic  LOV- Dr Hollie Salk- 01/10/17-epic  ECHO-01/02/17-epic

## 2017-02-06 NOTE — Progress Notes (Signed)
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  Also recent- 8-01/2017 small area of cellulitis on left forearm which received Doxycycline and completed.  Office visit notes by Dr Gerda Diss in epic.  Also currently has itching from patches placed by Dr Dallie Dad for allergy patches related to ? Metal allergy.  To follow up with Dr Dallie Dad oan 02/08/17 and 02/11/2017.

## 2017-02-07 ENCOUNTER — Ambulatory Visit: Payer: BLUE CROSS/BLUE SHIELD | Admitting: Allergy

## 2017-02-07 DIAGNOSIS — L230 Allergic contact dermatitis due to metals: Secondary | ICD-10-CM

## 2017-02-07 NOTE — Progress Notes (Signed)
    Follow-up Note  RE: Stephanie [name] MRN: [id number] DOB: 1960/12/02 Date of Office Visit: 02/07/2017  Primary care provider: Babs Sciara, MD Referring provider: Babs Sciara, MD   [name] returns to the office today for the initial patch test interpretation, given suspected history of contact dermatitis.    Diagnostics:  Metal patch 48 hour reading: negative  Plan:  Allergic contact dermatitis Back is clear at 48hour reading.  She will return on Monday for final read.    Margo Aye, MD [facility]

## 2017-02-11 ENCOUNTER — Ambulatory Visit (INDEPENDENT_AMBULATORY_CARE_PROVIDER_SITE_OTHER): Payer: BLUE CROSS/BLUE SHIELD | Admitting: Family Medicine

## 2017-02-11 ENCOUNTER — Ambulatory Visit: Payer: BLUE CROSS/BLUE SHIELD | Admitting: Allergy and Immunology

## 2017-02-11 ENCOUNTER — Encounter: Payer: Self-pay | Admitting: Family Medicine

## 2017-02-11 VITALS — Temp 98.2°F | Ht 64.0 in | Wt 181.4 lb

## 2017-02-11 DIAGNOSIS — B349 Viral infection, unspecified: Secondary | ICD-10-CM | POA: Diagnosis not present

## 2017-02-11 DIAGNOSIS — I889 Nonspecific lymphadenitis, unspecified: Secondary | ICD-10-CM

## 2017-02-11 DIAGNOSIS — J019 Acute sinusitis, unspecified: Secondary | ICD-10-CM | POA: Diagnosis not present

## 2017-02-11 DIAGNOSIS — Z872 Personal history of diseases of the skin and subcutaneous tissue: Secondary | ICD-10-CM

## 2017-02-11 MED ORDER — AMOXICILLIN 500 MG PO TABS: 500.0000 mg | ORAL_TABLET | Freq: Three times a day (TID) | ORAL | 0 refills | Status: DC

## 2017-02-11 NOTE — Patient Instructions (Signed)
History of contact dermatitis  Metal patch tests were negative on 02/07/2017 and again today, 02/11/2017.  She has stated that she plans to wear jewelry containing nickel prior to her surgery and will contact Dr. Lucie Leather if she has any further questions or issues.   Return if symptoms worsen or fail to improve.

## 2017-02-11 NOTE — Progress Notes (Signed)
    Follow-up Note  RE: Stephanie [name] MRN: [id number] DOB: [birth year] Date of Office Visit: 02/11/2017  Primary care provider: Babs Sciara, MD Referring provider: Babs Sciara, MD   Stephanie [name] returns to the office today for the final patch test interpretation, given suspected history of contact dermatitis.    Diagnostics:  Metal patch test, final reading: Negative.  Assessment and Plan:   History of dermatitis  Metal patch tests were negative on 02/07/2017 and again today, 02/11/2017.  She has stated that she plans to wear jewelry containing nickel prior to her surgery and will contact Dr. Lucie Leather if she has any further questions or issues.

## 2017-02-11 NOTE — Progress Notes (Signed)
   Subjective:    Patient ID: Stephanie [name], female    DOB: [birth year], 56 y.o.   MRN: [id number]  Cough  This is a new problem. The current episode started in the past 7 days. Associated symptoms include a fever, nasal congestion, rhinorrhea and a sore throat. Pertinent negatives include no chest pain, ear pain, shortness of breath or wheezing. She has tried nothing for the symptoms.    The right side of her neck is had a lymph node that was tender along with head congestion drainage sore throat not feeling good denies vomiting did have some fever muscle aches no wheezing or difficulty breathing  Review of Systems  Constitutional: Positive for fever. Negative for activity change.  HENT: Positive for congestion, rhinorrhea and sore throat. Negative for ear pain.   Eyes: Negative for discharge.  Respiratory: Positive for cough. Negative for shortness of breath and wheezing.   Cardiovascular: Negative for chest pain.       Objective:   Physical Exam  Constitutional: She appears well-developed.  HENT:  Head: Normocephalic.  Right Ear: External ear normal.  Left Ear: External ear normal.  Nose: Nose normal.  Mouth/Throat: Oropharynx is clear and moist. No oropharyngeal exudate.  Eyes: Right eye exhibits no discharge. Left eye exhibits no discharge.  Neck: Neck supple. No tracheal deviation present.  Patient with a small amount of cervical lymphadenitis  Cardiovascular: Normal rate and normal heart sounds.   No murmur heard. Pulmonary/Chest: Effort normal and breath sounds normal. She has no wheezes. She has no rales.  Lymphadenopathy:    She has cervical adenopathy.  Skin: Skin is warm and dry.  Nursing note and vitals reviewed.         Assessment & Plan:  Viral syndrome Cervical lymphadenitis Antibiotics prescribed warning signs discussed Should gradually get better but if not doing well over the next week notify us we may need to do some additional testing

## 2017-02-11 NOTE — Assessment & Plan Note (Signed)
   Metal patch tests were negative on 02/07/2017 and again today, 02/11/2017.  She has stated that she plans to wear jewelry containing nickel prior to her surgery and will contact Dr. Lucie Leather if she has any further questions or issues.

## 2017-02-13 LAB — TYPE AND SCREEN
ABO/RH(D): O POS
Antibody Screen: NEGATIVE

## 2017-03-08 NOTE — Progress Notes (Signed)
Please place orders in EPIC as patient is being scheduled for a pre-op appointment! Thank you! 

## 2017-03-10 ENCOUNTER — Ambulatory Visit: Payer: Self-pay | Admitting: Orthopedic Surgery

## 2017-03-11 ENCOUNTER — Ambulatory Visit: Payer: BLUE CROSS/BLUE SHIELD | Admitting: Family Medicine

## 2017-03-12 ENCOUNTER — Emergency Department (HOSPITAL_COMMUNITY): Payer: BLUE CROSS/BLUE SHIELD

## 2017-03-12 ENCOUNTER — Ambulatory Visit: Payer: Self-pay | Admitting: Orthopedic Surgery

## 2017-03-12 ENCOUNTER — Encounter (HOSPITAL_COMMUNITY): Payer: Self-pay | Admitting: Emergency Medicine

## 2017-03-12 ENCOUNTER — Emergency Department (HOSPITAL_COMMUNITY)
Admission: EM | Admit: 2017-03-12 | Discharge: 2017-03-12 | Disposition: A | Discharge status: Home / Self Care | Payer: BLUE CROSS/BLUE SHIELD | Attending: Emergency Medicine | Admitting: Emergency Medicine

## 2017-03-12 DIAGNOSIS — J45909 Unspecified asthma, uncomplicated: Secondary | ICD-10-CM | POA: Insufficient documentation

## 2017-03-12 DIAGNOSIS — Z79899 Other long term (current) drug therapy: Secondary | ICD-10-CM | POA: Diagnosis not present

## 2017-03-12 DIAGNOSIS — Z91040 Latex allergy status: Secondary | ICD-10-CM | POA: Insufficient documentation

## 2017-03-12 DIAGNOSIS — Z87891 Personal history of nicotine dependence: Secondary | ICD-10-CM | POA: Insufficient documentation

## 2017-03-12 DIAGNOSIS — M79604 Pain in right leg: Secondary | ICD-10-CM | POA: Diagnosis present

## 2017-03-12 DIAGNOSIS — Y9389 Activity, other specified: Secondary | ICD-10-CM | POA: Insufficient documentation

## 2017-03-12 DIAGNOSIS — Y929 Unspecified place or not applicable: Secondary | ICD-10-CM | POA: Diagnosis not present

## 2017-03-12 DIAGNOSIS — Y999 Unspecified external cause status: Secondary | ICD-10-CM | POA: Diagnosis not present

## 2017-03-12 DIAGNOSIS — I10 Essential (primary) hypertension: Secondary | ICD-10-CM | POA: Insufficient documentation

## 2017-03-12 MED ORDER — OXYCODONE-ACETAMINOPHEN 5-325 MG PO TABS
1.0000 | ORAL_TABLET | Freq: Once | ORAL | Status: AC
  Administered 2017-03-12: 1 via ORAL
  Filled 2017-03-12: qty 1
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
## 2017-03-21 NOTE — Progress Notes (Signed)
01-10-17 (EPIC) LOV Cardiology Dr. Purvis SheffieldKoneswaran  01-02-17 (EPIC) ECHO  11-22-16 Surgical clearance from Dr. Gerda DissLuking on chart.  11-26-16 (EPIC) EKG

## 2017-03-21 NOTE — Patient Instructions (Addendum)
Stephanie [name]  03/21/2017   Your procedure is scheduled on: 03-27-17   Report to [facility] Main  Entrance  Report to Admitting at 11:15 AM   Call this number if you have problems the morning of surgery  [phone]   Remember: ONLY 1 PERSON MAY GO WITH YOU TO SHORT STAY TO GET  READY MORNING OF YOUR SURGERY.  Do not eat food or drink liquids :After Midnight. You may have a Clear Liquid Diet from Midnight until 7:45 AM. After 7:45 AM, nothing until after surgery.     CLEAR LIQUID DIET   Foods Allowed                                                                     Foods Excluded  Coffee and tea, regular and decaf                             liquids that you cannot  Plain Jell-O in any flavor                                             see through such as: Fruit ices (not with fruit pulp)                                     milk, soups, orange juice  Iced Popsicles                                    All solid food Carbonated beverages, regular and diet                                    Cranberry, grape and apple juices Sports drinks like Gatorade Lightly seasoned clear broth or consume(fat free) Sugar, honey syrup  Sample Menu Breakfast                                Lunch                                     Supper Cranberry juice                    Beef broth                            Chicken broth Jell-O                                     Grape juice  Apple juice Coffee or tea                        Jell-O                                      Popsicle                                                Coffee or tea                        Coffee or tea  _____________________________________________________________________     Take these medicines the morning of surgery with A SIP OF WATER: Citalopram (Celexa). You may also bring and use your inhaler as needed.                                 You may not have any metal on  your body including hair pins and              piercings  Do not wear jewelry, make-up, lotions, powders or perfumes, deodorant             Do not wear nail polish.  Do not shave  48 hours prior to surgery.                Do not bring valuables to the hospital. [facility] IS NOT             RESPONSIBLE   FOR VALUABLES.  Contacts, dentures or bridgework may not be worn into surgery.  Leave suitcase in the car. After surgery it may be brought to your room.                 Please read over the following fact sheets you were given: _____________________________________________________________________  [facility] Pre[facility] Before surgery, you can play an important role.  Because skin is not sterile, your skin needs to be as free of germs as possible.  You can reduce the number of germs on your skin by washing with CHG (chlorahexidine gluconate) soap before surgery.  CHG is an antiseptic cleaner which kills germs and bonds with the skin to continue killing germs even after washing. Please DO NOT use if you have an allergy to CHG or antibacterial soaps.  If your skin becomes reddened/irritated stop using the CHG and inform your nurse when you arrive at Short Stay. Do not shave (including legs and underarms) for at least 48 hours prior to the first CHG shower.  You may shave your face/neck. Please follow these instructions carefully:  1.  Shower with CHG Soap the night before surgery and the  morning of Surgery.  2.  If you choose to wash your hair, wash your hair first as usual with your  normal  shampoo.  3.  After you shampoo, rinse your hair and body thoroughly to remove the  shampoo.                           4.  Use CHG as you would any other liquid soap.  You can apply  chg directly  to the skin and wash                       Gently with a scrungie or clean washcloth.  5.  Apply the CHG Soap to your body ONLY FROM THE NECK DOWN.   Do not use on face/ open                            Wound or open sores. Avoid contact with eyes, ears mouth and genitals (private parts).                       Wash face,  Genitals (private parts) with your normal soap.             6.  Wash thoroughly, paying special attention to the area where your surgery  will be performed.  7.  Thoroughly rinse your body with warm water from the neck down.  8.  DO NOT shower/wash with your normal soap after using and rinsing off  the CHG Soap.                9.  Pat yourself dry with a clean towel.            10.  Wear clean pajamas.            11.  Place clean sheets on your bed the night of your first shower and do not  sleep with pets. Day of Surgery : Do not apply any lotions/deodorants the morning of surgery.  Please wear clean clothes to the hospital/surgery center.  FAILURE TO FOLLOW THESE INSTRUCTIONS MAY RESULT IN THE CANCELLATION OF YOUR SURGERY PATIENT SIGNATURE_________________________________  NURSE SIGNATURE__________________________________  ________________________________________________________________________              Rogelia Mire  An incentive spirometer is a tool that can help keep your lungs clear and active. This tool measures how well you are filling your lungs with each breath. Taking long deep breaths may help reverse or decrease the chance of developing breathing (pulmonary) problems (especially infection) following:  A long period of time when you are unable to move or be active. BEFORE THE PROCEDURE   If the spirometer includes an indicator to show your best effort, your nurse or respiratory therapist will set it to a desired goal.  If possible, sit up straight or lean slightly forward. Try not to slouch.  Hold the incentive spirometer in an upright position. INSTRUCTIONS FOR USE  1. Sit on the edge of your bed if possible, or sit up as far as you can in bed or on a chair. 2. Hold the incentive spirometer in an upright position. 3. Breathe out  normally. 4. Place the mouthpiece in your mouth and seal your lips tightly around it. 5. Breathe in slowly and as deeply as possible, raising the piston or the ball toward the top of the column. 6. Hold your breath for 3-5 seconds or for as long as possible. Allow the piston or ball to fall to the bottom of the column. 7. Remove the mouthpiece from your mouth and breathe out normally. 8. Rest for a few seconds and repeat Steps 1 through 7 at least 10 times every 1-2 hours when you are awake. Take your time and take a few normal breaths between deep breaths. 9. The spirometer may include an indicator to show your best effort.  Use the indicator as a goal to work toward during each repetition. 10. After each set of 10 deep breaths, practice coughing to be sure your lungs are clear. If you have an incision (the cut made at the time of surgery), support your incision when coughing by placing a pillow or rolled up towels firmly against it. Once you are able to get out of bed, walk around indoors and cough well. You may stop using the incentive spirometer when instructed by your caregiver.  RISKS AND COMPLICATIONS  Take your time so you do not get dizzy or light-headed.  If you are in pain, you may need to take or ask for pain medication before doing incentive spirometry. It is harder to take a deep breath if you are having pain. AFTER USE  Rest and breathe slowly and easily.  It can be helpful to keep track of a log of your progress. Your caregiver can provide you with a simple table to help with this. If you are using the spirometer at home, follow these instructions: SEEK MEDICAL CARE IF:   You are having difficultly using the spirometer.  You have trouble using the spirometer as often as instructed.  Your pain medication is not giving enough relief while using the spirometer.  You develop fever of 100.5 F (38.1 C) or higher. SEEK IMMEDIATE MEDICAL CARE IF:   You cough up bloody sputum  that had not been present before.  You develop fever of 102 F (38.9 C) or greater.  You develop worsening pain at or near the incision site. MAKE SURE YOU:   Understand these instructions.  Will watch your condition.  Will get help right away if you are not doing well or get worse. Document Released: 09/10/2006 Document Revised: 07/23/2011 Document Reviewed: 11/11/2006 ExitCare Patient Information 2014 [employer], [place].   ________________________________________________________________________  WHAT IS A BLOOD TRANSFUSION? Blood Transfusion Information  A transfusion is the replacement of blood or some of its parts. Blood is made up of multiple cells which provide different functions.  Red blood cells carry oxygen and are used for blood loss replacement.  White blood cells fight against infection.  Platelets control bleeding.  Plasma helps clot blood.  Other blood products are available for specialized needs, such as hemophilia or other clotting disorders. BEFORE THE TRANSFUSION  Who gives blood for transfusions?   Healthy volunteers who are fully evaluated to make sure their blood is safe. This is blood bank blood. Transfusion therapy is the safest it has ever been in the practice of medicine. Before blood is taken from a donor, a complete history is taken to make sure that person has no history of diseases nor engages in risky social behavior (examples are intravenous drug use or sexual activity with multiple partners). The donor's travel history is screened to minimize risk of transmitting infections, such as malaria. The donated blood is tested for signs of infectious diseases, such as HIV and hepatitis. The blood is then tested to be sure it is compatible with you in order to minimize the chance of a transfusion reaction. If you or a relative donates blood, this is often done in anticipation of surgery and is not appropriate for emergency situations. It takes many days to  process the donated blood. RISKS AND COMPLICATIONS Although transfusion therapy is very safe and saves many lives, the main dangers of transfusion include:   Getting an infectious disease.  Developing a transfusion reaction. This is an allergic reaction to something in the  blood you were given. Every precaution is taken to prevent this. The decision to have a blood transfusion has been considered carefully by your caregiver before blood is given. Blood is not given unless the benefits outweigh the risks. AFTER THE TRANSFUSION  Right after receiving a blood transfusion, you will usually feel much better and more energetic. This is especially true if your red blood cells have gotten low (anemic). The transfusion raises the level of the red blood cells which carry oxygen, and this usually causes an energy increase.  The nurse administering the transfusion will monitor you carefully for complications. HOME CARE INSTRUCTIONS  No special instructions are needed after a transfusion. You may find your energy is better. Speak with your caregiver about any limitations on activity for underlying diseases you may have. SEEK MEDICAL CARE IF:   Your condition is not improving after your transfusion.  You develop redness or irritation at the intravenous (IV) site. SEEK IMMEDIATE MEDICAL CARE IF:  Any of the following symptoms occur over the next 12 hours:  Shaking chills.  You have a temperature by mouth above 102 F (38.9 C), not controlled by medicine.  Chest, back, or muscle pain.  People around you feel you are not acting correctly or are confused.  Shortness of breath or difficulty breathing.  Dizziness and fainting.  You get a rash or develop hives.  You have a decrease in urine output.  Your urine turns a dark color or changes to pink, red, or brown. Any of the following symptoms occur over the next 10 days:  You have a temperature by mouth above 102 F (38.9 C), not controlled by  medicine.  Shortness of breath.  Weakness after normal activity.  The white part of the eye turns yellow (jaundice).  You have a decrease in the amount of urine or are urinating less often.  Your urine turns a dark color or changes to pink, red, or brown. Document Released: 04/27/2000 Document Revised: 07/23/2011 Document Reviewed: 12/15/2007 [facility] Patient Information 2014 [place], [place].  _______________________________________________________________________

## 2017-03-22 ENCOUNTER — Encounter (HOSPITAL_COMMUNITY): Payer: Self-pay

## 2017-03-22 ENCOUNTER — Encounter (HOSPITAL_COMMUNITY)
Admission: RE | Admit: 2017-03-22 | Discharge: 2017-03-22 | Disposition: A | Discharge status: Home / Self Care | Payer: BLUE CROSS/BLUE SHIELD | Source: Ambulatory Visit | Attending: Orthopedic Surgery | Admitting: Orthopedic Surgery

## 2017-03-22 ENCOUNTER — Encounter (INDEPENDENT_AMBULATORY_CARE_PROVIDER_SITE_OTHER): Payer: Self-pay

## 2017-03-22 ENCOUNTER — Other Ambulatory Visit: Payer: Self-pay

## 2017-03-22 DIAGNOSIS — M1611 Unilateral primary osteoarthritis, right hip: Secondary | ICD-10-CM | POA: Diagnosis not present

## 2017-03-22 DIAGNOSIS — Z01812 Encounter for preprocedural laboratory examination: Secondary | ICD-10-CM | POA: Diagnosis present

## 2017-03-22 LAB — COMPREHENSIVE METABOLIC PANEL
ALT: 18 U/L (ref 14–54)
AST: 22 U/L (ref 15–41)
Albumin: 4.3 g/dL (ref 3.5–5.0)
Alkaline Phosphatase: 74 U/L (ref 38–126)
Anion gap: 11 (ref 5–15)
BUN: 12 mg/dL (ref 6–20)
CHLORIDE: 102 mmol/L (ref 101–111)
CO2: 27 mmol/L (ref 22–32)
CREATININE: 0.73 mg/dL (ref 0.44–1.00)
Calcium: 9.4 mg/dL (ref 8.9–10.3)
Glucose, Bld: 92 mg/dL (ref 65–99)
Potassium: 3.5 mmol/L (ref 3.5–5.1)
Sodium: 140 mmol/L (ref 135–145)
TOTAL PROTEIN: 8 g/dL (ref 6.5–8.1)
Total Bilirubin: 1 mg/dL (ref 0.3–1.2)

## 2017-03-22 LAB — CBC
HCT: 42.6 % (ref 36.0–46.0)
Hemoglobin: 14.2 g/dL (ref 12.0–15.0)
MCH: 29.8 pg (ref 26.0–34.0)
MCHC: 33.3 g/dL (ref 30.0–36.0)
MCV: 89.3 fL (ref 78.0–100.0)
PLATELETS: 255 10*3/uL (ref 150–400)
RBC: 4.77 MIL/uL (ref 3.87–5.11)
RDW: 14 % (ref 11.5–15.5)
WBC: 5.7 10*3/uL (ref 4.0–10.5)

## 2017-03-22 LAB — SURGICAL PCR SCREEN
MRSA, PCR: NEGATIVE
Staphylococcus aureus: NEGATIVE

## 2017-03-22 LAB — PROTIME-INR
INR: 0.9
PROTHROMBIN TIME: 12.1 s (ref 11.4–15.2)

## 2017-03-22 LAB — HCG, SERUM, QUALITATIVE: PREG SERUM: NEGATIVE

## 2017-03-22 LAB — APTT: aPTT: 34 seconds (ref 24–36)

## 2017-03-24 ENCOUNTER — Ambulatory Visit: Payer: Self-pay | Admitting: Orthopedic Surgery

## 2017-03-24 NOTE — H&P (Signed)
Stephanie [name] [name] DOB: 05-24-1960 Divorced / Language: English / Race: Black or African American Female Date of Admission:  03/27/2017 CC: Right hip pain History of Present Illness The patient is a 56 year old female who comes in  for a preoperative History and Physical. The patient is scheduled for a right total hip arthroplasty (anterior) to be performed by Dr. Gus RankinFrank V. Aluisio, [facility] on 03-27-2017. The patient is a 56 year old female who presented to the practice with right hip problems including pain (OA) symptoms that have been present for 2 year(s). The symptoms began without any known injury. Symptoms reported include hip pain (constant, increased pain with weight), night pain and stiffness Current treatment includes use of a walker (cane) and opioid analgesics (Oxycodone 5mg ). Previous workup for this problem has included hip x-rays. unfortunately her right hip is gotten progressively worse over time. It is hurting all day and night. It is limiting her function significantly. She has lost a lot of range of motion is a hard time doing activities of daily living. She has a hard time sleeping also. She is only taking one oxycodone every 2 days. Her overall health is good. She has had some issues of hypertension but it is fairly well-controlled and her primary care practitioner is managing that actively. Radiographs AP pelvis and lateral both hips show severe end-stage arthritis in both hips bone-on-bone throughout with some bony erosions and near ankylosed hip on the right. Her left hip is advanced also but not quite as bad as the right. At this point, the most predictable means of improving pain and function is total hip arthroplasty. They have been treated conservatively in the past for the above stated problem and despite conservative measures, they continue to have progressive pain and severe functional limitations and dysfunction. They have failed non-operative management  including home exercise, medications. It is felt that they would benefit from undergoing total joint replacement. Risks and benefits of the procedure have been discussed with the patient and they elect to proceed with surgery. There are no active contraindications to surgery such as ongoing infection or rapidly progressive neurological disease.   Problem List/Past Medical Primary localized osteoarthritis of left hip (M16.12)  Pain of both hip joints (M25.551, M25.552)  Chronic Pain  Depression  Heart murmur  High blood pressure  Osteoarthritis  Asthma  Bronchitis  Pneumonia   Allergies Aspirin *ANALGESICS - NonNarcotic*  Anaphylaxis. HYDROcodone Bitartrate *CHEMICALS*  Itching. Advil *ANALGESICS - ANTI-INFLAMMATORY*  Bad Reaction Gabapentin *ANTICONVULSANTS*  Drowsy Latex  Nickel   Family History Hypertension  Mother. Father  Deceased. age 55, Diabetic Mother  Deceased. age 55, High Blood Pressure  Social History Children  3 Current drinker  06/06/2016: Currently drinks beer and wine only occasionally per week Current work status  disabled Exercise  Exercises daily; does other Living situation  live alone Marital status  divorced No history of drug/alcohol rehab  Tobacco / smoke exposure  06/06/2016: no Tobacco use  Former smoker. 06/06/2016: smoke(d) less than 1/2 pack(s) per day Under pain contract  Post-Surgical Plans  Home With Family, Home with HHPT. Advance Directives  None.  Medication History AmLODIPine Besylate (10MG  Tablet, Oral) Active. Breo Ellipta (200-25MCG/INH Aero Pow Br Act, Inhalation) Active. Nizoral (2% Cream, External) Active. Kenalog (0.1% Cream, External) Active. Oxycodone 10mg  Active. Calcium (Oral) Specific strength unknown - Active. Chlorzoxazone (500MG  Tablet, Oral) Active. Citalopram Hydrobromide (20MG  Tablet, Oral) Active. EpiPen 2-Pak (Injection) Specific strength unknown - Active. ProAir HFA  (  108 (90 Base)MCG/ACT Aerosol Soln, Inhalation) Active. Triamterene-HCTZ (37.5-25MG  Tablet, Oral) Active. Vitamin E (Oral) Specific strength unknown - Active. Rhinocort Allergy (32MCG/ACT Suspension, Nasal) Active. Montelukast Sodium (10MG  Tablet, Oral) Active. Multivitamin Adult (Oral) Active.   Past Surgical History  Cesarean Delivery  3 or more times Tonsillectomy   Review of Systems General Present- Fatigue. Not Present- Chills, Fever, Memory Loss, Night Sweats, Weight Gain and Weight Loss. Skin Not Present- Eczema, Hives, Itching, Lesions and Rash. HEENT Not Present- Dentures, Double Vision, Headache, Hearing Loss, Tinnitus and Visual Loss. Respiratory Present- Allergies, Shortness of breath with exertion and Snoring. Not Present- Chronic Cough, Coughing up blood and Shortness of breath at rest. Cardiovascular Present- Murmur and Racing/skipping heartbeats. Not Present- Chest Pain, Difficulty Breathing Lying Down, Palpitations and Swelling. Gastrointestinal Not Present- Abdominal Pain, Bloody Stool, Constipation, Diarrhea, Difficulty Swallowing, Heartburn, Jaundice, Loss of appetitie, Nausea and Vomiting. Female Genitourinary Not Present- Blood in Urine, Discharge, Flank Pain, Incontinence, Painful Urination, Urgency, Urinary frequency, Urinary Retention, Urinating at Night and Weak urinary stream. Musculoskeletal Present- Back Pain, Decreased Range of Motion, Joint Pain, Joint Redness, Joint Stiffness, Leg Cramps, Morning Stiffness, Muscle Cramps, Muscle Pain, Muscle Weakness and Swelling of Extremities. Not Present- Joint Swelling and Spasms. Neurological Not Present- Blackout spells, Difficulty with balance, Dizziness, Paralysis, Tremor and Weakness. Psychiatric Present- Personality Changes. Not Present- Insomnia.  Vitals Weight: 180 lb Height: 64in Weight was reported by patient. Height was reported by patient. Body Surface Area: 1.87 m Body Mass Index: 30.9 kg/m   Pulse: 88 (Regular)  BP: 138/92 (Sitting, Right Arm, Standard)   Physical Exam  General Mental Status -Alert, cooperative and good historian. General Appearance-pleasant, Not in acute distress. Orientation-Oriented X3. Build & Nutrition-Well nourished and Well developed.  Head and Neck Head-normocephalic, atraumatic . Neck Global Assessment - supple, no bruit auscultated on the right, no bruit auscultated on the left.  Eye Vision-Wears corrective lenses(readers only). Pupil - Bilateral-Regular and Round. Motion - Bilateral-EOMI.  Chest and Lung Exam Auscultation Breath sounds - clear at anterior chest wall and clear at posterior chest wall. Adventitious sounds - No Adventitious sounds.  Cardiovascular Auscultation Rhythm - Regular rate and rhythm. Heart Sounds - S1 WNL and S2 WNL. Murmurs & Other Heart Sounds - Auscultation of the heart reveals - No Murmurs.  Abdomen Palpation/Percussion Tenderness - Abdomen is non-tender to palpation. Rigidity (guarding) - Abdomen is soft. Auscultation Auscultation of the abdomen reveals - Bowel sounds normal.  Female Genitourinary Note: Not done, not pertinent to present illness   Musculoskeletal Note: Right hip can be flexed to 70 with no internal or external rotation and no abduction. Her right knee exam is normal. Pulse sensation and motor intact both lower extremities. Left hip can be flexed 110 minimal internal rotation about 10 of external rotation 20 of abduction. She has a significantly antalgic gait pattern in regards to both legs. Radiographs AP pelvis and lateral both hips show severe end-stage arthritis in both hips bone-on-bone throughout with some bony erosions and near ankylosed hip on the right. Her left hip is advanced also but not quite as bad as the right.   Assessment & Plan Primary osteoarthritis of right hip (M16.11)  Note:Surgical Plans: Right Total Hip Replacement - Anterior  Approach  Disposition: Home with family  PCP:   IV TXA  Anesthesia Issues: None  Patient was instructed on what medications to stop prior to surgery.  Signed electronically by Lauraine RinneAlexzandrew L Perkins, III PA-C

## 2017-03-24 NOTE — H&P (View-Only) (Signed)
Stephanie [name] [name] DOB: 05-24-1960 Divorced / Language: English / Race: Black or African American Female Date of Admission:  03/27/2017 CC: Right hip pain History of Present Illness The patient is a 56 year old female who comes in  for a preoperative History and Physical. The patient is scheduled for a right total hip arthroplasty (anterior) to be performed by Dr. Gus RankinFrank V. Aluisio, MD at [facility] on 03-27-2017. The patient is a 56 year old female who presented to the practice with right hip problems including pain (OA) symptoms that have been present for 2 year(s). The symptoms began without any known injury. Symptoms reported include hip pain (constant, increased pain with weight), night pain and stiffness Current treatment includes use of a walker (cane) and opioid analgesics (Oxycodone 5mg ). Previous workup for this problem has included hip x-rays. unfortunately her right hip is gotten progressively worse over time. It is hurting all day and night. It is limiting her function significantly. She has lost a lot of range of motion is a hard time doing activities of daily living. She has a hard time sleeping also. She is only taking one oxycodone every 2 days. Her overall health is good. She has had some issues of hypertension but it is fairly well-controlled and her primary care practitioner is managing that actively. Radiographs AP pelvis and lateral both hips show severe end-stage arthritis in both hips bone-on-bone throughout with some bony erosions and near ankylosed hip on the right. Her left hip is advanced also but not quite as bad as the right. At this point, the most predictable means of improving pain and function is total hip arthroplasty. They have been treated conservatively in the past for the above stated problem and despite conservative measures, they continue to have progressive pain and severe functional limitations and dysfunction. They have failed non-operative management  including home exercise, medications. It is felt that they would benefit from undergoing total joint replacement. Risks and benefits of the procedure have been discussed with the patient and they elect to proceed with surgery. There are no active contraindications to surgery such as ongoing infection or rapidly progressive neurological disease.   Problem List/Past Medical Primary localized osteoarthritis of left hip (M16.12)  Pain of both hip joints (M25.551, M25.552)  Chronic Pain  Depression  Heart murmur  High blood pressure  Osteoarthritis  Asthma  Bronchitis  Pneumonia   Allergies Aspirin *ANALGESICS - NonNarcotic*  Anaphylaxis. HYDROcodone Bitartrate *CHEMICALS*  Itching. Advil *ANALGESICS - ANTI-INFLAMMATORY*  Bad Reaction Gabapentin *ANTICONVULSANTS*  Drowsy Latex  Nickel   Family History Hypertension  Mother. Father  Deceased. age 55, Diabetic Mother  Deceased. age 55, High Blood Pressure  Social History Children  3 Current drinker  06/06/2016: Currently drinks beer and wine only occasionally per week Current work status  disabled Exercise  Exercises daily; does other Living situation  live alone Marital status  divorced No history of drug/alcohol rehab  Tobacco / smoke exposure  06/06/2016: no Tobacco use  Former smoker. 06/06/2016: smoke(d) less than 1/2 pack(s) per day Under pain contract  Post-Surgical Plans  Home With Family, Home with HHPT. Advance Directives  None.  Medication History AmLODIPine Besylate (10MG  Tablet, Oral) Active. Breo Ellipta (200-25MCG/INH Aero Pow Br Act, Inhalation) Active. Nizoral (2% Cream, External) Active. Kenalog (0.1% Cream, External) Active. Oxycodone 10mg  Active. Calcium (Oral) Specific strength unknown - Active. Chlorzoxazone (500MG  Tablet, Oral) Active. Citalopram Hydrobromide (20MG  Tablet, Oral) Active. EpiPen 2-Pak (Injection) Specific strength unknown - Active. ProAir HFA  (  108 (90 Base)MCG/ACT Aerosol Soln, Inhalation) Active. Triamterene-HCTZ (37.5-25MG Tablet, Oral) Active. Vitamin E (Oral) Specific strength unknown - Active. Rhinocort Allergy (32MCG/ACT Suspension, Nasal) Active. Montelukast Sodium (10MG Tablet, Oral) Active. Multivitamin Adult (Oral) Active.   Past Surgical History  Cesarean Delivery  3 or more times Tonsillectomy   Review of Systems General Present- Fatigue. Not Present- Chills, Fever, Memory Loss, Night Sweats, Weight Gain and Weight Loss. Skin Not Present- Eczema, Hives, Itching, Lesions and Rash. HEENT Not Present- Dentures, Double Vision, Headache, Hearing Loss, Tinnitus and Visual Loss. Respiratory Present- Allergies, Shortness of breath with exertion and Snoring. Not Present- Chronic Cough, Coughing up blood and Shortness of breath at rest. Cardiovascular Present- Murmur and Racing/skipping heartbeats. Not Present- Chest Pain, Difficulty Breathing Lying Down, Palpitations and Swelling. Gastrointestinal Not Present- Abdominal Pain, Bloody Stool, Constipation, Diarrhea, Difficulty Swallowing, Heartburn, Jaundice, Loss of appetitie, Nausea and Vomiting. Female Genitourinary Not Present- Blood in Urine, Discharge, Flank Pain, Incontinence, Painful Urination, Urgency, Urinary frequency, Urinary Retention, Urinating at Night and Weak urinary stream. Musculoskeletal Present- Back Pain, Decreased Range of Motion, Joint Pain, Joint Redness, Joint Stiffness, Leg Cramps, Morning Stiffness, Muscle Cramps, Muscle Pain, Muscle Weakness and Swelling of Extremities. Not Present- Joint Swelling and Spasms. Neurological Not Present- Blackout spells, Difficulty with balance, Dizziness, Paralysis, Tremor and Weakness. Psychiatric Present- Personality Changes. Not Present- Insomnia.  Vitals Weight: 180 lb Height: 64in Weight was reported by patient. Height was reported by patient. Body Surface Area: 1.87 m Body Mass Index: 30.9 kg/m   Pulse: 88 (Regular)  BP: 138/92 (Sitting, Right Arm, Standard)   Physical Exam  General Mental Status -Alert, cooperative and good historian. General Appearance-pleasant, Not in acute distress. Orientation-Oriented X3. Build & Nutrition-Well nourished and Well developed.  Head and Neck Head-normocephalic, atraumatic . Neck Global Assessment - supple, no bruit auscultated on the right, no bruit auscultated on the left.  Eye Vision-Wears corrective lenses(readers only). Pupil - Bilateral-Regular and Round. Motion - Bilateral-EOMI.  Chest and Lung Exam Auscultation Breath sounds - clear at anterior chest wall and clear at posterior chest wall. Adventitious sounds - No Adventitious sounds.  Cardiovascular Auscultation Rhythm - Regular rate and rhythm. Heart Sounds - S1 WNL and S2 WNL. Murmurs & Other Heart Sounds - Auscultation of the heart reveals - No Murmurs.  Abdomen Palpation/Percussion Tenderness - Abdomen is non-tender to palpation. Rigidity (guarding) - Abdomen is soft. Auscultation Auscultation of the abdomen reveals - Bowel sounds normal.  Female Genitourinary Note: Not done, not pertinent to present illness   Musculoskeletal Note: Right hip can be flexed to 70 with no internal or external rotation and no abduction. Her right knee exam is normal. Pulse sensation and motor intact both lower extremities. Left hip can be flexed 110 minimal internal rotation about 10 of external rotation 20 of abduction. She has a significantly antalgic gait pattern in regards to both legs. Radiographs AP pelvis and lateral both hips show severe end-stage arthritis in both hips bone-on-bone throughout with some bony erosions and near ankylosed hip on the right. Her left hip is advanced also but not quite as bad as the right.   Assessment & Plan Primary osteoarthritis of right hip (M16.11)  Note:Surgical Plans: Right Total Hip Replacement - Anterior  Approach  Disposition: Home with family  PCP:   IV TXA  Anesthesia Issues: None  Patient was instructed on what medications to stop prior to surgery.  Signed electronically by Lilybeth Vien L Gregoria Selvy, III PA-C  

## 2017-03-25 ENCOUNTER — Telehealth: Payer: Self-pay | Admitting: Family Medicine

## 2017-03-25 NOTE — Telephone Encounter (Signed)
Requesting refill on oxycodone.  She said her surgeon wants to leave her under Dr. Roby LoftsScott's care for pain medication.

## 2017-03-26 MED ORDER — OXYCODONE-ACETAMINOPHEN 5-325 MG PO TABS: 1.0000 | ORAL_TABLET | ORAL | 0 refills | Status: DC | PRN

## 2017-03-26 NOTE — Telephone Encounter (Signed)
Left message return call 03/26/17
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
## 2017-03-26 NOTE — Telephone Encounter (Signed)
Discussed with pt. Pt verbalized understanding. Med at front for pickup.

## 2017-03-26 NOTE — Telephone Encounter (Signed)
May have rx for Oxycodone 5/325 one 4 hours prn pain, #50- she will need to do follow up ov as soon as she is able after surgery

## 2017-03-26 NOTE — Telephone Encounter (Signed)
Nurses-please talk with patient and find out #1 has she been sticking with 3/day?  #2 when is her surgery date #3 we may need to write her a prescription for oxycodone that she can use more frequently the first week after her surgery then get back to her usual 3/day-should be able to get this prescription done so she can pick it up this afternoon

## 2017-03-27 ENCOUNTER — Other Ambulatory Visit: Payer: Self-pay

## 2017-03-27 ENCOUNTER — Inpatient Hospital Stay (HOSPITAL_COMMUNITY)
Admission: RE | Admit: 2017-03-27 | Discharge: 2017-03-29 | DRG: 470 | Disposition: A | Discharge status: Home Health | Payer: BLUE CROSS/BLUE SHIELD | Source: Ambulatory Visit | Attending: Orthopedic Surgery | Admitting: Orthopedic Surgery

## 2017-03-27 ENCOUNTER — Inpatient Hospital Stay (HOSPITAL_COMMUNITY): Payer: BLUE CROSS/BLUE SHIELD | Admitting: Anesthesiology

## 2017-03-27 ENCOUNTER — Inpatient Hospital Stay (HOSPITAL_COMMUNITY): Payer: BLUE CROSS/BLUE SHIELD

## 2017-03-27 ENCOUNTER — Encounter (HOSPITAL_COMMUNITY)
Admission: RE | Disposition: A | Discharge status: Home Health | Payer: Self-pay | Source: Ambulatory Visit | Attending: Orthopedic Surgery

## 2017-03-27 ENCOUNTER — Encounter (HOSPITAL_COMMUNITY): Payer: Self-pay | Admitting: Anesthesiology

## 2017-03-27 DIAGNOSIS — Z87891 Personal history of nicotine dependence: Secondary | ICD-10-CM | POA: Diagnosis not present

## 2017-03-27 DIAGNOSIS — I10 Essential (primary) hypertension: Secondary | ICD-10-CM | POA: Diagnosis present

## 2017-03-27 DIAGNOSIS — J45909 Unspecified asthma, uncomplicated: Secondary | ICD-10-CM | POA: Diagnosis present

## 2017-03-27 DIAGNOSIS — M25551 Pain in right hip: Secondary | ICD-10-CM | POA: Diagnosis present

## 2017-03-27 DIAGNOSIS — M169 Osteoarthritis of hip, unspecified: Secondary | ICD-10-CM | POA: Diagnosis present

## 2017-03-27 DIAGNOSIS — F329 Major depressive disorder, single episode, unspecified: Secondary | ICD-10-CM | POA: Diagnosis present

## 2017-03-27 DIAGNOSIS — Z96649 Presence of unspecified artificial hip joint: Secondary | ICD-10-CM

## 2017-03-27 DIAGNOSIS — G8929 Other chronic pain: Secondary | ICD-10-CM | POA: Diagnosis present

## 2017-03-27 DIAGNOSIS — M1611 Unilateral primary osteoarthritis, right hip: Secondary | ICD-10-CM | POA: Diagnosis present

## 2017-03-27 DIAGNOSIS — M160 Bilateral primary osteoarthritis of hip: Secondary | ICD-10-CM

## 2017-03-27 HISTORY — PX: TOTAL HIP ARTHROPLASTY: SHX124

## 2017-03-27 LAB — TYPE AND SCREEN
ABO/RH(D): O POS
Antibody Screen: NEGATIVE

## 2017-03-27 SURGERY — ARTHROPLASTY, HIP, TOTAL, ANTERIOR APPROACH
Anesthesia: Spinal | Site: Hip | Laterality: Right

## 2017-03-27 MED ORDER — PROMETHAZINE HCL 25 MG/ML IJ SOLN
6.2500 mg | INTRAMUSCULAR | Status: DC | PRN
Start: 2017-03-27 — End: 2017-03-27

## 2017-03-27 MED ORDER — PHENOL 1.4 % MT LIQD: 1.0000 | OROMUCOSAL | Status: DC | PRN

## 2017-03-27 MED ORDER — CHLORHEXIDINE GLUCONATE 4 % EX LIQD: 60.0000 mL | Freq: Once | CUTANEOUS | Status: DC

## 2017-03-27 MED ORDER — CEFAZOLIN SODIUM-DEXTROSE 2-4 GM/100ML-% IV SOLN
2.0000 g | INTRAVENOUS | Status: AC
  Administered 2017-03-27: 2 g via INTRAVENOUS

## 2017-03-27 MED ORDER — PHENYLEPHRINE HCL 10 MG/ML IJ SOLN
INTRAMUSCULAR | Status: AC
  Filled 2017-03-27: qty 1

## 2017-03-27 MED ORDER — ACETAMINOPHEN 10 MG/ML IV SOLN
1000.0000 mg | Freq: Once | INTRAVENOUS | Status: AC
  Administered 2017-03-27: 1000 mg via INTRAVENOUS

## 2017-03-27 MED ORDER — MENTHOL 3 MG MT LOZG: 1.0000 | LOZENGE | OROMUCOSAL | Status: DC | PRN

## 2017-03-27 MED ORDER — ACETAMINOPHEN 650 MG RE SUPP
650.0000 mg | RECTAL | Status: DC | PRN
Start: 2017-03-28 — End: 2017-03-29

## 2017-03-27 MED ORDER — DEXTROSE 5 % IV SOLN
500.0000 mg | Freq: Four times a day (QID) | INTRAVENOUS | Status: DC | PRN
  Administered 2017-03-27: 500 mg via INTRAVENOUS
  Filled 2017-03-27: qty 550

## 2017-03-27 MED ORDER — ONDANSETRON HCL 4 MG/2ML IJ SOLN
INTRAMUSCULAR | Status: DC | PRN
  Administered 2017-03-27: 4 mg via INTRAVENOUS

## 2017-03-27 MED ORDER — DEXAMETHASONE SODIUM PHOSPHATE 10 MG/ML IJ SOLN: 10.0000 mg | Freq: Once | INTRAMUSCULAR | Status: DC

## 2017-03-27 MED ORDER — PHENYLEPHRINE HCL 10 MG/ML IJ SOLN
INTRAVENOUS | Status: DC | PRN
  Administered 2017-03-27: 25 ug/min via INTRAVENOUS

## 2017-03-27 MED ORDER — PHENYLEPHRINE 40 MCG/ML (10ML) SYRINGE FOR IV PUSH (FOR BLOOD PRESSURE SUPPORT)
PREFILLED_SYRINGE | INTRAVENOUS | Status: DC | PRN
  Administered 2017-03-27: 80 ug via INTRAVENOUS

## 2017-03-27 MED ORDER — LACTATED RINGERS IV SOLN
INTRAVENOUS | Status: DC | PRN
  Administered 2017-03-27 (×2): via INTRAVENOUS

## 2017-03-27 MED ORDER — TRANEXAMIC ACID 1000 MG/10ML IV SOLN
1000.0000 mg | INTRAVENOUS | Status: AC
  Administered 2017-03-27: 1000 mg via INTRAVENOUS
  Filled 2017-03-27: qty 1100

## 2017-03-27 MED ORDER — CEFAZOLIN SODIUM-DEXTROSE 2-4 GM/100ML-% IV SOLN: 2.0000 g | INTRAVENOUS | Status: DC

## 2017-03-27 MED ORDER — FENTANYL CITRATE (PF) 100 MCG/2ML IJ SOLN
INTRAMUSCULAR | Status: DC | PRN
  Administered 2017-03-27 (×2): 50 ug via INTRAVENOUS

## 2017-03-27 MED ORDER — FLUTICASONE FUROATE-VILANTEROL 200-25 MCG/INH IN AEPB
1.0000 | INHALATION_SPRAY | Freq: Every day | RESPIRATORY_TRACT | Status: DC
  Administered 2017-03-28 – 2017-03-29 (×2): 1 via RESPIRATORY_TRACT
  Filled 2017-03-27: qty 28

## 2017-03-27 MED ORDER — ACETAMINOPHEN 325 MG PO TABS: 650.0000 mg | ORAL_TABLET | ORAL | Status: DC | PRN

## 2017-03-27 MED ORDER — SODIUM CHLORIDE 0.9 % IV SOLN
INTRAVENOUS | Status: DC
  Administered 2017-03-27: 17:00:00 via INTRAVENOUS

## 2017-03-27 MED ORDER — DIPHENHYDRAMINE HCL 12.5 MG/5ML PO ELIX
12.5000 mg | ORAL_SOLUTION | ORAL | Status: DC | PRN
  Administered 2017-03-28: 12.5 mg via ORAL
  Administered 2017-03-29: 25 mg via ORAL
  Filled 2017-03-27 (×2): qty 10
  Filled 2017-03-27: qty 5

## 2017-03-27 MED ORDER — CHLORZOXAZONE 500 MG PO TABS: 500.0000 mg | ORAL_TABLET | Freq: Three times a day (TID) | ORAL | Status: DC | PRN

## 2017-03-27 MED ORDER — METOCLOPRAMIDE HCL 5 MG PO TABS: 5.0000 mg | ORAL_TABLET | Freq: Three times a day (TID) | ORAL | Status: DC | PRN

## 2017-03-27 MED ORDER — OXYCODONE HCL 5 MG PO TABS
5.0000 mg | ORAL_TABLET | ORAL | Status: DC | PRN
  Administered 2017-03-27: 5 mg via ORAL

## 2017-03-27 MED ORDER — METHOCARBAMOL 500 MG PO TABS
500.0000 mg | ORAL_TABLET | Freq: Four times a day (QID) | ORAL | Status: DC | PRN
  Administered 2017-03-27 – 2017-03-29 (×6): 500 mg via ORAL
  Filled 2017-03-27 (×6): qty 1

## 2017-03-27 MED ORDER — MONTELUKAST SODIUM 10 MG PO TABS
10.0000 mg | ORAL_TABLET | Freq: Every day | ORAL | Status: DC
  Administered 2017-03-27 – 2017-03-28 (×2): 10 mg via ORAL
  Filled 2017-03-27 (×2): qty 1

## 2017-03-27 MED ORDER — TRIAMTERENE-HCTZ 37.5-25 MG PO TABS
1.0000 | ORAL_TABLET | Freq: Every day | ORAL | Status: DC
  Administered 2017-03-28 – 2017-03-29 (×2): 1 via ORAL
  Filled 2017-03-27 (×2): qty 1

## 2017-03-27 MED ORDER — DOCUSATE SODIUM 100 MG PO CAPS
100.0000 mg | ORAL_CAPSULE | Freq: Two times a day (BID) | ORAL | Status: DC
  Administered 2017-03-27 – 2017-03-29 (×4): 100 mg via ORAL
  Filled 2017-03-27 (×4): qty 1

## 2017-03-27 MED ORDER — ACETAMINOPHEN 10 MG/ML IV SOLN: 1000.0000 mg | Freq: Once | INTRAVENOUS | Status: DC

## 2017-03-27 MED ORDER — PROPOFOL 500 MG/50ML IV EMUL
INTRAVENOUS | Status: DC | PRN
  Administered 2017-03-27: 75 ug/kg/min via INTRAVENOUS

## 2017-03-27 MED ORDER — ONDANSETRON HCL 4 MG PO TABS: 4.0000 mg | ORAL_TABLET | Freq: Four times a day (QID) | ORAL | Status: DC | PRN

## 2017-03-27 MED ORDER — PROPOFOL 10 MG/ML IV BOLUS
INTRAVENOUS | Status: AC
  Filled 2017-03-27: qty 40

## 2017-03-27 MED ORDER — POLYETHYLENE GLYCOL 3350 17 G PO PACK: 17.0000 g | PACK | Freq: Every day | ORAL | Status: DC | PRN

## 2017-03-27 MED ORDER — CEFAZOLIN SODIUM-DEXTROSE 2-4 GM/100ML-% IV SOLN
INTRAVENOUS | Status: AC
  Filled 2017-03-27: qty 100

## 2017-03-27 MED ORDER — ONDANSETRON HCL 4 MG/2ML IJ SOLN
INTRAMUSCULAR | Status: AC
  Filled 2017-03-27: qty 2

## 2017-03-27 MED ORDER — FENTANYL CITRATE (PF) 100 MCG/2ML IJ SOLN
INTRAMUSCULAR | Status: AC
  Filled 2017-03-27: qty 2

## 2017-03-27 MED ORDER — ACETAMINOPHEN 10 MG/ML IV SOLN
INTRAVENOUS | Status: AC
Start: 2017-03-27 — End: 2017-03-27
  Filled 2017-03-27: qty 100

## 2017-03-27 MED ORDER — GLYCOPYRROLATE 0.2 MG/ML IV SOSY
PREFILLED_SYRINGE | INTRAVENOUS | Status: DC | PRN
  Administered 2017-03-27: .2 mg via INTRAVENOUS

## 2017-03-27 MED ORDER — MORPHINE SULFATE (PF) 4 MG/ML IV SOLN
1.0000 mg | INTRAVENOUS | Status: DC | PRN
  Administered 2017-03-27 (×2): 1 mg via INTRAVENOUS
  Filled 2017-03-27 (×2): qty 1

## 2017-03-27 MED ORDER — MIDAZOLAM HCL 2 MG/2ML IJ SOLN
INTRAMUSCULAR | Status: AC
  Filled 2017-03-27: qty 2

## 2017-03-27 MED ORDER — CITALOPRAM HYDROBROMIDE 20 MG PO TABS
20.0000 mg | ORAL_TABLET | Freq: Every day | ORAL | Status: DC
  Administered 2017-03-28 – 2017-03-29 (×2): 20 mg via ORAL
  Filled 2017-03-27 (×2): qty 1

## 2017-03-27 MED ORDER — STERILE WATER FOR IRRIGATION IR SOLN
Status: DC | PRN
  Administered 2017-03-27: 3000 mL

## 2017-03-27 MED ORDER — RIVAROXABAN 10 MG PO TABS
10.0000 mg | ORAL_TABLET | Freq: Every day | ORAL | Status: DC
  Administered 2017-03-28 – 2017-03-29 (×2): 10 mg via ORAL
  Filled 2017-03-27 (×2): qty 1

## 2017-03-27 MED ORDER — FLEET ENEMA 7-19 GM/118ML RE ENEM: 1.0000 | ENEMA | Freq: Once | RECTAL | Status: DC | PRN

## 2017-03-27 MED ORDER — LACTATED RINGERS IV SOLN: INTRAVENOUS | Status: DC

## 2017-03-27 MED ORDER — GLYCOPYRROLATE 0.2 MG/ML IV SOSY
PREFILLED_SYRINGE | INTRAVENOUS | Status: AC
  Filled 2017-03-27: qty 5

## 2017-03-27 MED ORDER — HYDROMORPHONE HCL 1 MG/ML IJ SOLN
0.2500 mg | INTRAMUSCULAR | Status: DC | PRN
  Administered 2017-03-27 (×2): 0.5 mg via INTRAVENOUS

## 2017-03-27 MED ORDER — DEXAMETHASONE SODIUM PHOSPHATE 10 MG/ML IJ SOLN
10.0000 mg | Freq: Once | INTRAMUSCULAR | Status: AC
Start: 2017-03-28 — End: 2017-03-28
  Administered 2017-03-28: 10 mg via INTRAVENOUS
  Filled 2017-03-27: qty 1

## 2017-03-27 MED ORDER — BISACODYL 10 MG RE SUPP: 10.0000 mg | Freq: Every day | RECTAL | Status: DC | PRN

## 2017-03-27 MED ORDER — OXYCODONE HCL 5 MG PO TABS
10.0000 mg | ORAL_TABLET | ORAL | Status: DC | PRN
  Administered 2017-03-27 – 2017-03-29 (×11): 10 mg via ORAL
  Filled 2017-03-27 (×12): qty 2

## 2017-03-27 MED ORDER — MIDAZOLAM HCL 5 MG/5ML IJ SOLN
INTRAMUSCULAR | Status: DC | PRN
  Administered 2017-03-27: 0.5 mg via INTRAVENOUS
  Administered 2017-03-27: 1 mg via INTRAVENOUS
  Administered 2017-03-27: 0.5 mg via INTRAVENOUS

## 2017-03-27 MED ORDER — DEXAMETHASONE SODIUM PHOSPHATE 10 MG/ML IJ SOLN
10.0000 mg | Freq: Once | INTRAMUSCULAR | Status: AC
  Administered 2017-03-27: 10 mg via INTRAVENOUS

## 2017-03-27 MED ORDER — HYDROMORPHONE HCL 1 MG/ML IJ SOLN
INTRAMUSCULAR | Status: AC
  Administered 2017-03-27: 0.5 mg via INTRAVENOUS
  Filled 2017-03-27: qty 2

## 2017-03-27 MED ORDER — BUPIVACAINE IN DEXTROSE 0.75-8.25 % IT SOLN
INTRATHECAL | Status: DC | PRN
  Administered 2017-03-27: 1.8 mL via INTRATHECAL

## 2017-03-27 MED ORDER — CEFAZOLIN SODIUM-DEXTROSE 2-4 GM/100ML-% IV SOLN
2.0000 g | Freq: Four times a day (QID) | INTRAVENOUS | Status: AC
  Administered 2017-03-27 – 2017-03-28 (×2): 2 g via INTRAVENOUS
  Filled 2017-03-27 (×2): qty 100

## 2017-03-27 MED ORDER — BUPIVACAINE HCL (PF) 0.25 % IJ SOLN
INTRAMUSCULAR | Status: DC | PRN
  Administered 2017-03-27: 30 mL

## 2017-03-27 MED ORDER — METOCLOPRAMIDE HCL 5 MG/ML IJ SOLN: 5.0000 mg | Freq: Three times a day (TID) | INTRAMUSCULAR | Status: DC | PRN

## 2017-03-27 MED ORDER — ACETAMINOPHEN 500 MG PO TABS
1000.0000 mg | ORAL_TABLET | Freq: Four times a day (QID) | ORAL | Status: AC
  Administered 2017-03-27 – 2017-03-28 (×4): 1000 mg via ORAL
  Filled 2017-03-27 (×4): qty 2

## 2017-03-27 MED ORDER — SODIUM CHLORIDE 0.9 % IR SOLN
Status: DC | PRN
  Administered 2017-03-27: 1000 mL

## 2017-03-27 MED ORDER — LACTATED RINGERS IV SOLN
INTRAVENOUS | Status: DC
  Administered 2017-03-27: 13:00:00 via INTRAVENOUS

## 2017-03-27 MED ORDER — BUPIVACAINE HCL (PF) 0.25 % IJ SOLN
INTRAMUSCULAR | Status: AC
  Filled 2017-03-27: qty 30

## 2017-03-27 MED ORDER — ALBUTEROL SULFATE (2.5 MG/3ML) 0.083% IN NEBU: 3.0000 mL | INHALATION_SOLUTION | RESPIRATORY_TRACT | Status: DC | PRN

## 2017-03-27 MED ORDER — ONDANSETRON HCL 4 MG/2ML IJ SOLN: 4.0000 mg | Freq: Four times a day (QID) | INTRAMUSCULAR | Status: DC | PRN

## 2017-03-27 MED ORDER — DEXAMETHASONE SODIUM PHOSPHATE 10 MG/ML IJ SOLN
INTRAMUSCULAR | Status: AC
Start: 2017-03-27 — End: 2017-03-27
  Filled 2017-03-27: qty 1

## 2017-03-27 SURGICAL SUPPLY — 36 items
BAG DECANTER FOR FLEXI CONT (MISCELLANEOUS) ×2 IMPLANT
BAG SPEC THK2 15X12 ZIP CLS (MISCELLANEOUS)
BAG ZIPLOCK 12X15 (MISCELLANEOUS) IMPLANT
BLADE SAG 18X100X1.27 (BLADE) ×2 IMPLANT
CAPT HIP TOTAL 2 ×1 IMPLANT
CLOTH BEACON ORANGE TIMEOUT ST (SAFETY) ×2 IMPLANT
COVER PERINEAL POST (MISCELLANEOUS) ×2 IMPLANT
COVER SURGICAL LIGHT HANDLE (MISCELLANEOUS) ×2 IMPLANT
DECANTER SPIKE VIAL GLASS SM (MISCELLANEOUS) ×2 IMPLANT
DRAPE STERI IOBAN 125X83 (DRAPES) ×2 IMPLANT
DRAPE U-SHAPE 47X51 STRL (DRAPES) ×4 IMPLANT
DRSG ADAPTIC 3X8 NADH LF (GAUZE/BANDAGES/DRESSINGS) ×2 IMPLANT
DRSG MEPILEX BORDER 4X4 (GAUZE/BANDAGES/DRESSINGS) ×2 IMPLANT
DRSG MEPILEX BORDER 4X8 (GAUZE/BANDAGES/DRESSINGS) ×2 IMPLANT
DURAPREP 26ML APPLICATOR (WOUND CARE) ×2 IMPLANT
ELECT REM PT RETURN 15FT ADLT (MISCELLANEOUS) ×2 IMPLANT
EVACUATOR 1/8 PVC DRAIN (DRAIN) ×2 IMPLANT
GLOVE BIOGEL PI IND STRL 7.0 (GLOVE) IMPLANT
GLOVE BIOGEL PI IND STRL 7.5 (GLOVE) IMPLANT
GLOVE BIOGEL PI IND STRL 8 (GLOVE) ×2 IMPLANT
GLOVE BIOGEL PI INDICATOR 7.0 (GLOVE) ×6
GLOVE BIOGEL PI INDICATOR 7.5 (GLOVE) ×8
GLOVE BIOGEL PI INDICATOR 8 (GLOVE) ×2
GOWN STRL REUS W/TWL LRG LVL3 (GOWN DISPOSABLE) ×2 IMPLANT
GOWN STRL REUS W/TWL XL LVL3 (GOWN DISPOSABLE) ×2 IMPLANT
PACK ANTERIOR HIP CUSTOM (KITS) ×2 IMPLANT
STRIP CLOSURE SKIN 1/2X4 (GAUZE/BANDAGES/DRESSINGS) ×2 IMPLANT
SUT ETHIBOND NAB CT1 #1 30IN (SUTURE) ×2 IMPLANT
SUT MNCRL AB 4-0 PS2 18 (SUTURE) ×2 IMPLANT
SUT STRATAFIX 0 PDS 27 VIOLET (SUTURE) ×4
SUT VIC AB 2-0 CT1 27 (SUTURE) ×4
SUT VIC AB 2-0 CT1 TAPERPNT 27 (SUTURE) ×2 IMPLANT
SUTURE STRATFX 0 PDS 27 VIOLET (SUTURE) ×1 IMPLANT
SYR 50ML LL SCALE MARK (SYRINGE) IMPLANT
TRAY FOLEY METER SIL LF 16FR (CATHETERS) ×1 IMPLANT
YANKAUER SUCT BULB TIP 10FT TU (MISCELLANEOUS) ×2 IMPLANT

## 2017-03-27 NOTE — Discharge Instructions (Addendum)
° °Dr. Frank Aluisio °Total Joint Specialist °[facility] °[address] °, [place] [address] °[phone] ° °ANTERIOR APPROACH TOTAL HIP REPLACEMENT POSTOPERATIVE DIRECTIONS ° ° °Hip Rehabilitation, Guidelines Following Surgery  °The results of a hip operation are greatly improved after range of motion and muscle strengthening exercises. Follow all safety measures which are given to protect your hip. If any of these exercises cause increased pain or swelling in your joint, decrease the amount until you are comfortable again. Then slowly increase the exercises. Call your caregiver if you have problems or questions.  ° °HOME CARE INSTRUCTIONS  °Remove items at home which could result in a fall. This includes throw rugs or furniture in walking pathways.  °· ICE to the affected hip every three hours for 30 minutes at a time and then as needed for pain and swelling.  Continue to use ice on the hip for pain and swelling from surgery. You may notice swelling that will progress down to the foot and ankle.  This is normal after surgery.  Elevate the leg when you are not up walking on it.   °· Continue to use the breathing machine which will help keep your temperature down.  It is common for your temperature to cycle up and down following surgery, especially at night when you are not up moving around and exerting yourself.  The breathing machine keeps your lungs expanded and your temperature down. ° ° °DIET °You may resume your previous home diet once your are discharged from the hospital. ° °DRESSING / WOUND CARE / SHOWERING °You may shower 3 days after surgery, but keep the wounds dry during showering.  You may use an occlusive plastic wrap (Press'n Seal for example), NO SOAKING/SUBMERGING IN THE BATHTUB.  If the bandage gets wet, change with a clean dry gauze.  If the incision gets wet, pat the wound dry with a clean towel. °You may start showering once you are discharged home but do not  submerge the incision under water. Just pat the incision dry and apply a dry gauze dressing on daily. °Change the surgical dressing daily and reapply a dry dressing each time. ° °ACTIVITY °Walk with your walker as instructed. °Use walker as long as suggested by your caregivers. °Avoid periods of inactivity such as sitting longer than an hour when not asleep. This helps prevent blood clots.  °You may resume a sexual relationship in one month or when given the OK by your doctor.  °You may return to work once you are cleared by your doctor.  °Do not drive a car for 6 weeks or until released by you surgeon.  °Do not drive while taking narcotics. ° °WEIGHT BEARING °Weight bearing as tolerated with assist device (walker, cane, etc) as directed, use it as long as suggested by your surgeon or therapist, typically at least 4-6 weeks. ° °POSTOPERATIVE CONSTIPATION PROTOCOL °Constipation - defined medically as fewer than three stools per week and severe constipation as less than one stool per week. ° °One of the most common issues patients have following surgery is constipation.  Even if you have a regular bowel pattern at home, your normal regimen is likely to be disrupted due to multiple reasons following surgery.  Combination of anesthesia, postoperative narcotics, change in appetite and fluid intake all can affect your bowels.  In order to avoid complications following surgery, here are some recommendations in order to help you during your recovery period. ° °Colace (docusate) - Pick up an over-the-counter   form of Colace or another stool softener and take twice a day as long as you are requiring postoperative pain medications.  Take with a full glass of water daily.  If you experience loose stools or diarrhea, hold the colace until you stool forms back up.  If your symptoms do not get better within 1 week or if they get worse, check with your doctor. ° °Dulcolax (bisacodyl) - Pick up over-the-counter and take as directed  by the product packaging as needed to assist with the movement of your bowels.  Take with a full glass of water.  Use this product as needed if not relieved by Colace only.  ° °MiraLax (polyethylene glycol) - Pick up over-the-counter to have on hand.  MiraLax is a solution that will increase the amount of water in your bowels to assist with bowel movements.  Take as directed and can mix with a glass of water, juice, soda, coffee, or tea.  Take if you go more than two days without a movement. °Do not use MiraLax more than once per day. Call your doctor if you are still constipated or irregular after using this medication for 7 days in a row. ° °If you continue to have problems with postoperative constipation, please contact the office for further assistance and recommendations.  If you experience "the worst abdominal pain ever" or develop nausea or vomiting, please contact the office immediatly for further recommendations for treatment. ° °ITCHING ° If you experience itching with your medications, try taking only a single pain pill, or even half a pain pill at a time.  You can also use Benadryl over the counter for itching or also to help with sleep.  ° °TED HOSE STOCKINGS °Wear the elastic stockings on both legs for three weeks following surgery during the day but you may remove then at night for sleeping. ° °MEDICATIONS °See your medication summary on the “After Visit Summary” that the nursing staff will review with you prior to discharge.  You may have some home medications which will be placed on hold until you complete the course of blood thinner medication.  It is important for you to complete the blood thinner medication as prescribed by your surgeon.  Continue your approved medications as instructed at time of discharge. ° °PRECAUTIONS °If you experience chest pain or shortness of breath - call 911 immediately for transfer to the hospital emergency department.  °If you develop a fever greater that 101 F,  purulent drainage from wound, increased redness or drainage from wound, foul odor from the wound/dressing, or calf pain - CONTACT YOUR SURGEON.   °                                                °FOLLOW-UP APPOINTMENTS °Make sure you keep all of your appointments after your operation with your surgeon and caregivers. You should call the office at the above phone number and make an appointment for approximately two weeks after the date of your surgery or on the date instructed by your surgeon outlined in the "After Visit Summary". ° °RANGE OF MOTION AND STRENGTHENING EXERCISES  °These exercises are designed to help you keep full movement of your hip joint. Follow your caregiver's or physical therapist's instructions. Perform all exercises about fifteen times, three times per day or as directed. Exercise both hips, even if you   have had only one joint replacement. These exercises can be done on a training (exercise) mat, on the floor, on a table or on a bed. Use whatever works the best and is most comfortable for you. Use music or television while you are exercising so that the exercises are a pleasant break in your day. This will make your life better with the exercises acting as a break in routine you can look forward to.  °Lying on your back, slowly slide your foot toward your buttocks, raising your knee up off the floor. Then slowly slide your foot back down until your leg is straight again.  °Lying on your back spread your legs as far apart as you can without causing discomfort.  °Lying on your side, raise your upper leg and foot straight up from the floor as far as is comfortable. Slowly lower the leg and repeat.  °Lying on your back, tighten up the muscle in the front of your thigh (quadriceps muscles). You can do this by keeping your leg straight and trying to raise your heel off the floor. This helps strengthen the largest muscle supporting your knee.  °Lying on your back, tighten up the muscles of your  buttocks both with the legs straight and with the knee bent at a comfortable angle while keeping your heel on the floor.  ° °IF YOU ARE TRANSFERRED TO A SKILLED REHAB FACILITY °If the patient is transferred to a skilled rehab facility following release from the hospital, a list of the current medications will be sent to the facility for the patient to continue.  When discharged from the skilled rehab facility, please have the facility set up the patient's Home Health Physical Therapy prior to being released. Also, the skilled facility will be responsible for providing the patient with their medications at time of release from the facility to include their pain medication, the muscle relaxants, and their blood thinner medication. If the patient is still at the rehab facility at time of the two week follow up appointment, the skilled rehab facility will also need to assist the patient in arranging follow up appointment in our office and any transportation needs. ° °MAKE SURE YOU:  °Understand these instructions.  °Get help right away if you are not doing well or get worse.  ° ° °Pick up stool softner and laxative for home use following surgery while on pain medications. °Do not submerge incision under water. °Please use good hand washing techniques while changing dressing each day. °May shower starting three days after surgery. °Please use a clean towel to pat the incision dry following showers. °Continue to use ice for pain and swelling after surgery. °Do not use any lotions or creams on the incision until instructed by your surgeon. ° °Take Xarelto for two and a half more weeks following discharge from the hospital, then discontinue Xarelto. °Once the patient has completed the blood thinner regimen, then take a Baby 81 mg Aspirin daily for three more weeks. ° ° ° ° °Information on my medicine - XARELTO® (Rivaroxaban) ° °Why was Xarelto® prescribed for you? °Xarelto® was prescribed for you to reduce the risk of blood  clots forming after orthopedic surgery. The medical term for these abnormal blood clots is venous thromboembolism (VTE). ° °What do you need to know about xarelto® ? °Take your Xarelto® ONCE DAILY at the same time every day. °You may take it either with or without food. ° °If you have difficulty swallowing the tablet whole, you may   crush it and mix in applesauce just prior to taking your dose. ° °Take Xarelto® exactly as prescribed by your doctor and DO NOT stop taking Xarelto® without talking to the doctor who prescribed the medication.  Stopping without other VTE prevention medication to take the place of Xarelto® may increase your risk of developing a clot. ° °After discharge, you should have regular check-up appointments with your healthcare provider that is prescribing your Xarelto®.   ° °What do you do if you miss a dose? °If you miss a dose, take it as soon as you remember on the same day then continue your regularly scheduled once daily regimen the next day. Do not take two doses of Xarelto® on the same day.  ° °Important Safety Information °A possible side effect of Xarelto® is bleeding. You should call your healthcare provider right away if you experience any of the following: °? Bleeding from an injury or your nose that does not stop. °? Unusual colored urine (red or dark brown) or unusual colored stools (red or black). °? Unusual bruising for unknown reasons. °? A serious fall or if you hit your head (even if there is no bleeding). ° °Some medicines may interact with Xarelto® and might increase your risk of bleeding while on Xarelto®. To help avoid this, consult your healthcare provider or pharmacist prior to using any new prescription or non-prescription medications, including herbals, vitamins, non-steroidal anti-inflammatory drugs (NSAIDs) and supplements. ° °This website has more information on Xarelto®: www.xarelto.com. ° ° ° °

## 2017-03-27 NOTE — Op Note (Signed)
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   COMPLICATIONS: None   CONDITION: PACU - hemodynamically stable.   BRIEF CLINICAL NOTE: Stephanie [name] is a 56 y.o. female who has advanced end-  stage arthritis of their Right  hip with progressively worsening pain and  dysfunction.The patient has failed nonoperative management and presents for  total hip arthroplasty.   PROCEDURE IN DETAIL: After successful administration of spinal  anesthetic, the traction boots for the [facility] bed were placed on both  feet and the [facility], boots placed into the leg  holders. The Right hip was then isolated from the perineum with plastic  drapes and prepped and draped in the usual sterile fashion. ASIS and  greater trochanter were marked and a oblique incision was made, starting  at about 1 cm lateral and 2 cm distal to the ASIS and coursing towards  the anterior cortex of the femur. The skin was cut with a 10 blade  through subcutaneous tissue to the level of the fascia overlying the  tensor fascia lata muscle. The fascia was then incised in line with the  incision at the junction of the anterior third and posterior 2/3rd. The  muscle was teased off the fascia and then the interval between the TFL  and the rectus was developed. The Hohmann retractor was then placed at  the top of the femoral neck over the capsule. The vessels overlying the  capsule were cauterized and the fat on top of the capsule was removed.  A Hohmann retractor was then placed anterior underneath the rectus   femoris to give exposure to the entire anterior capsule. A T-shaped  capsulotomy was performed. The edges were tagged and the femoral head  was identified.       Osteophytes are removed off the superior acetabulum.  The femoral neck was then cut in situ with an oscillating saw. Traction  was then applied to the left lower extremity utilizing the [facility]  traction. The femoral head was then removed. Retractors were placed  around the acetabulum and then circumferential removal of the labrum was  performed. Osteophytes were also removed. Reaming starts at 43 mm to  medialize and  Increased in 2 mm increments to 49 mm. We reamed in  approximately 40 degrees of abduction, 20 degrees anteversion. She had a large medial contained defect from the protrusio deformity which I grafted with autograft from the femoral head. A 50 mm pinnacle acetabular shell was then impacted in anatomic position under  fluoroscopic guidance with excellent purchase. We did not need to place  any additional dome screws. The graft restored the normal center of rotation of the acetabulum. A 32 mm neutral + 4 marathon liner was then  placed into the acetabular shell.       The femoral lift was then placed along the lateral aspect of the femur  just distal to the vastus ridge. The leg was  externally rotated and capsule  was stripped off the inferior aspect of the femoral neck down to the  level of the lesser trochanter, this was done with electrocautery.  The femur was lifted after this was performed. The  leg was then placed in an extended and adducted position essentially delivering the femur. We also removed the capsule superiorly and the piriformis from the piriformis fossa to gain excellent exposure of the  proximal femur. Rongeur was used to remove some cancellous bone to get  into the lateral portion of the proximal femur for placement of the  initial starter reamer. The starter broaches was placed  the starter broach  and  was shown to go down the center of the canal. Broaching  with the  Corail system was then performed starting at size 8, coursing  Up to size 10. A size 10 had excellent torsional and rotational  and axial stability. The trial high offset neck was then placed  with a 32 + 1 trial head. The hip was then reduced. We confirmed that  the stem was in the canal both on AP and lateral x-rays. It also has excellent sizing. The hip was reduced with outstanding stability through full extension and full external rotation.. AP pelvis was taken and the leg lengths were measured and found to be approximately 5 mm longer on the rightl. Hip was then dislocated again and the femoral head and neck removed. The femoral broach was removed. Size 10 Corail stem with a high offset  neck was then impacted into the femur following native anteversion. Has  excellent purchase in the canal. Excellent torsional and rotational and  axial stability. It is confirmed to be in the canal on AP and lateral  fluoroscopic views. The 32 + 1  ceramic head was placed and the hip  reduced with outstanding stability. Again AP pelvis was taken and it  confirmed that the leg lengths were unchanged from the trial. The wound was then copiously  irrigated with saline solution and the capsule reattached and repaired  with Ethibond suture. 30 ml of .25% Bupivicaine was  injected into the capsule and into the edge of the tensor fascia lata as well as subcutaneous tissue. The fascia overlying the tensor fascia lata was then closed with a running #1 V-Loc. Subcu was closed with interrupted 2-0 Vicryl and subcuticular running 4-0 Monocryl. Incision was cleaned  and dried. Steri-Strips and a bulky sterile dressing applied. Hemovac  drain was hooked to suction and then the patient was awakened and transported to recovery in stable condition.        Please note that a surgical assistant was a medical necessity for this procedure to perform it in a safe and  expeditious manner. Assistant was necessary to provide appropriate retraction of vital neurovascular structures and to prevent femoral fracture and allow for anatomic placement of the prosthesis.  Ollen GrossFrank Anupama Piehl, M.D.

## 2017-03-27 NOTE — Transfer of Care (Signed)
Immediate Anesthesia Transfer of Care Note  Patient: Stephanie [name]  Procedure(s) Performed: RIGHT TOTAL HIP ARTHROPLASTY ANTERIOR APPROACH (Right Hip)  Patient Location: PACU  Anesthesia Type:Spinal  Level of Consciousness: awake, alert  and oriented  Airway & Oxygen Therapy: Patient Spontanous Breathing and Patient connected to face mask oxygen  Post-op Assessment: Report given to RN  Post vital signs: Reviewed and stable  Last Vitals:  Vitals:   03/27/17 1200 03/27/17 1201  BP:  138/84  Pulse: 68   Resp: 18   Temp: 36.9 C   SpO2: 97%     Last Pain:  Vitals:   03/27/17 1200  TempSrc: Oral         Complications: No apparent anesthesia complications

## 2017-03-27 NOTE — Interval H&P Note (Signed)
History and Physical Interval Note:  03/27/2017 12:52 PM  Stephanie [name]  has presented today for surgery, with the diagnosis of Osteoarthritis Right Hip  The various methods of treatment have been discussed with the patient and family. After consideration of risks, benefits and other options for treatment, the patient has consented to  Procedure(s): RIGHT TOTAL HIP ARTHROPLASTY ANTERIOR APPROACH (Right) as a surgical intervention .  The patient's history has been reviewed, patient examined, no change in status, stable for surgery.  I have reviewed the patient's chart and labs.  Questions were answered to the patient's satisfaction.     Loanne DrillingALUISIO,Kacy Conely V

## 2017-03-27 NOTE — Anesthesia Preprocedure Evaluation (Signed)
Anesthesia Evaluation  Patient identified by MRN, date of birth, ID band Patient awake    Reviewed: Allergy & Precautions, NPO status , Patient's Chart, lab work & pertinent test results  Airway Mallampati: II  TM Distance: >3 FB Neck ROM: Full    Dental no notable dental hx.    Pulmonary asthma , former smoker,    Pulmonary exam normal breath sounds clear to auscultation       Cardiovascular hypertension, Normal cardiovascular exam Rhythm:Regular Rate:Normal     Neuro/Psych negative neurological ROS  negative psych ROS   GI/Hepatic negative GI ROS, Neg liver ROS,   Endo/Other  negative endocrine ROS  Renal/GU negative Renal ROS  negative genitourinary   Musculoskeletal negative musculoskeletal ROS (+)   Abdominal   Peds negative pediatric ROS (+)  Hematology negative hematology ROS (+)   Anesthesia Other Findings   Reproductive/Obstetrics negative OB ROS                             Anesthesia Physical Anesthesia Plan  ASA: II  Anesthesia Plan: Spinal   Post-op Pain Management:    Induction: Intravenous  PONV Risk Score and Plan: 2 and Ondansetron, Dexamethasone and Treatment may vary due to age or medical condition  Airway Management Planned: Simple Face Mask  Additional Equipment:   Intra-op Plan:   Post-operative Plan:   Informed Consent: I have reviewed the patients History and Physical, chart, labs and discussed the procedure including the risks, benefits and alternatives for the proposed anesthesia with the patient or authorized representative who has indicated his/her understanding and acceptance.   Dental advisory given  Plan Discussed with: CRNA and Surgeon  Anesthesia Plan Comments:         Anesthesia Quick Evaluation

## 2017-03-27 NOTE — Anesthesia Postprocedure Evaluation (Signed)
Anesthesia Post Note  Patient: Derrek Monacoorma Gossard  Procedure(s) Performed: RIGHT TOTAL HIP ARTHROPLASTY ANTERIOR APPROACH (Right Hip)     Patient location during evaluation: PACU Anesthesia Type: Spinal Level of consciousness: oriented and awake and alert Pain management: pain level controlled Vital Signs Assessment: post-procedure vital signs reviewed and stable Respiratory status: spontaneous breathing, respiratory function stable and patient connected to nasal cannula oxygen Cardiovascular status: blood pressure returned to baseline and stable Postop Assessment: no headache, no backache and no apparent nausea or vomiting Anesthetic complications: no    Last Vitals:  Vitals:   03/27/17 1612 03/27/17 1736  BP: (!) 142/91 120/65  Pulse: 64 68  Resp: 14 14  Temp: (!) 36.4 C   SpO2: 100% 100%    Last Pain:  Vitals:   03/27/17 1655  TempSrc:   PainSc: 8                  Jeri Jeanbaptiste S

## 2017-03-28 LAB — BASIC METABOLIC PANEL
ANION GAP: 5 (ref 5–15)
BUN: 9 mg/dL (ref 6–20)
CHLORIDE: 105 mmol/L (ref 101–111)
CO2: 26 mmol/L (ref 22–32)
Calcium: 8.2 mg/dL — ABNORMAL LOW (ref 8.9–10.3)
Creatinine, Ser: 0.66 mg/dL (ref 0.44–1.00)
GFR calc Af Amer: >60 mL/min (ref >60)
GFR calc non Af Amer: >60 mL/min (ref >60)
GLUCOSE: 135 mg/dL — AB (ref 65–99)
POTASSIUM: 3.6 mmol/L (ref 3.5–5.1)
Sodium: 136 mmol/L (ref 135–145)

## 2017-03-28 LAB — CBC
HEMATOCRIT: 29.6 % — AB (ref 36.0–46.0)
Hemoglobin: 10.1 g/dL — ABNORMAL LOW (ref 12.0–15.0)
MCH: 30.2 pg (ref 26.0–34.0)
MCHC: 34.1 g/dL (ref 30.0–36.0)
MCV: 88.6 fL (ref 78.0–100.0)
Platelets: 189 10*3/uL (ref 150–400)
RBC: 3.34 MIL/uL — AB (ref 3.87–5.11)
RDW: 13.9 % (ref 11.5–15.5)
WBC: 8.8 10*3/uL (ref 4.0–10.5)

## 2017-03-28 MED ORDER — RIVAROXABAN 10 MG PO TABS: 10.0000 mg | ORAL_TABLET | Freq: Every day | ORAL | 0 refills | Status: DC

## 2017-03-28 MED ORDER — METHOCARBAMOL 500 MG PO TABS: 500.0000 mg | ORAL_TABLET | Freq: Four times a day (QID) | ORAL | 0 refills | Status: DC | PRN

## 2017-03-28 MED ORDER — OXYCODONE HCL 5 MG PO TABS: 5.0000 mg | ORAL_TABLET | ORAL | 0 refills | Status: DC | PRN

## 2017-03-28 MED ORDER — FLUCONAZOLE 150 MG PO TABS
150.0000 mg | ORAL_TABLET | Freq: Every day | ORAL | Status: DC
  Administered 2017-03-28 – 2017-03-29 (×2): 150 mg via ORAL
  Filled 2017-03-28 (×2): qty 1

## 2017-03-28 NOTE — Evaluation (Signed)
Physical Therapy Evaluation Patient Details Name: Stephanie [name] MRN: [id number] DOB: 12-25-1960 Today's Date: 03/28/2017   History of Present Illness  Pt s/p R THR   Clinical Impression  Pt s/p R THR and presents with decreased R LE strength/ROM and post op pain limiting functional mobility.  Pt should progress to dc home with family assist.    Follow Up Recommendations DC plan and follow up therapy as arranged by surgeon;Home health PT    Equipment Recommendations  None recommended by PT    Recommendations for Other Services OT consult     Precautions / Restrictions Precautions Precautions: Fall Restrictions Weight Bearing Restrictions: No      Mobility  Bed Mobility Overal bed mobility: Needs Assistance Bed Mobility: Supine to Sit     Supine to sit: Mod assist     General bed mobility comments: cues for sequence and use of L LE to self assist  Transfers Overall transfer level: Needs assistance Equipment used: Rolling walker (2 wheeled) Transfers: Sit to/from Stand Sit to Stand: Min assist;Mod assist         General transfer comment: cues for LE management and use of UEs to self assist  Ambulation/Gait Ambulation/Gait assistance: Min assist;Mod assist Ambulation Distance (Feet): 18 Feet Assistive device: Rolling walker (2 wheeled) Gait Pattern/deviations: Step-to pattern;Decreased step length - right;Shuffle;Trunk flexed;Antalgic Gait velocity: decr Gait velocity interpretation: Below normal speed for age/gender General Gait Details: cues for sequence, posture and position from RW.  Physical assist for balance/support and to advance L LE  Stairs            Wheelchair Mobility    Modified Rankin (Stroke Patients Only)       Balance Overall balance assessment: Needs assistance Sitting-balance support: No upper extremity supported;Feet supported Sitting balance-Leahy Scale: Fair     Standing balance support: Bilateral upper extremity  supported Standing balance-Leahy Scale: Poor                               Pertinent Vitals/Pain Pain Assessment: 0-10 Pain Score: 7  Pain Location: R hip Pain Descriptors / Indicators: Aching;Sore Pain Intervention(s): Limited activity within patient's tolerance;Monitored during session;Premedicated before session;Ice applied    Home Living Family/patient expects to be discharged to:: Private residence Living Arrangements: Children Available Help at Discharge: Family Type of Home: House Home Access: Stairs to enter   Secretary/administratorntrance Stairs-Number of Steps: 1+1 Home Layout: One level Home Equipment: Scientist, forensicWalker - standard;Walker - 4 wheels;Wheelchair - manual;Bedside commode      Prior Function Level of Independence: Independent with assistive device(s)         Comments: Pt utilizing w/c or RW      Hand Dominance        Extremity/Trunk Assessment   Upper Extremity Assessment Upper Extremity Assessment: Overall WFL for tasks assessed    Lower Extremity Assessment Lower Extremity Assessment: RLE deficits/detail RLE Deficits / Details: Strength at hip 2/5 with AAROM at hip to 65 flex and 5 abd    Cervical / Trunk Assessment Cervical / Trunk Assessment: Normal  Communication   Communication: No difficulties  Cognition Arousal/Alertness: Awake/alert Behavior During Therapy: WFL for tasks assessed/performed Overall Cognitive Status: Within Functional Limits for tasks assessed  General Comments      Exercises Total Joint Exercises Ankle Circles/Pumps: AROM;Both;15 reps;Supine Quad Sets: AROM;Both;10 reps;Supine Heel Slides: AAROM;Right;15 reps;Supine Hip ABduction/ADduction: AAROM;Right;10 reps;Supine   Assessment/Plan    PT Assessment Patient needs continued PT services  PT Problem List Decreased strength;Decreased range of motion;Decreased activity tolerance;Decreased balance;Decreased  mobility;Decreased knowledge of use of DME;Pain;Obesity       PT Treatment Interventions DME instruction;Gait training;Stair training;Functional mobility training;Therapeutic activities;Therapeutic exercise;Balance training;Patient/family education    PT Goals (Current goals can be found in the Care Plan section)  Acute Rehab PT Goals Patient Stated Goal: Regain IND and walk with less pain PT Goal Formulation: With patient Time For Goal Achievement: 04/01/17 Potential to Achieve Goals: Good    Frequency 7X/week   Barriers to discharge        Co-evaluation               AM-PAC PT "6 Clicks" Daily Activity  Outcome Measure Difficulty turning over in bed (including adjusting bedclothes, sheets and blankets)?: Unable Difficulty moving from lying on back to sitting on the side of the bed? : Unable Difficulty sitting down on and standing up from a chair with arms (e.g., wheelchair, bedside commode, etc,.)?: Unable Help needed moving to and from a bed to chair (including a wheelchair)?: A Lot Help needed walking in hospital room?: A Lot Help needed climbing 3-5 steps with a railing? : Total 6 Click Score: 8    End of Session Equipment Utilized During Treatment: Gait belt Activity Tolerance: Patient limited by fatigue Patient left: in chair;with call bell/phone within reach;with family/visitor present Nurse Communication: Mobility status PT Visit Diagnosis: Unsteadiness on feet (R26.81);Difficulty in walking, not elsewhere classified (R26.2)    Time: [id number] PT Time Calculation (min) (ACUTE ONLY): 40 min   Charges:   PT Evaluation $PT Eval Low Complexity: 1 Low PT Treatments $Gait Training: 8-22 mins $Therapeutic Exercise: 8-22 mins   PT G Codes:        Pg [phone]   Stephanie [name] 03/28/2017, 12:48 PM

## 2017-03-28 NOTE — Progress Notes (Signed)
Discharge planning, spoke with patient and family at beside. [facility], [facility]. Requesting therapist Mora Bellmanmily Moore, [facility] Needs [facility]W, contacted [facility] to deliver to room. [phone]

## 2017-03-28 NOTE — Progress Notes (Signed)
Physical Therapy Treatment Patient Details Name: Stephanie [name] MRN: [id number] DOB: 11/19/1960 Today's Date: 03/28/2017    History of Present Illness Pt s/p R THR     PT Comments    Pt motivated and progressing well with mobility this pm - marked improvement in activity tolerance vs am session.   Follow Up Recommendations  DC plan and follow up therapy as arranged by surgeon;Home health PT     Equipment Recommendations  None recommended by PT    Recommendations for Other Services OT consult     Precautions / Restrictions Precautions Precautions: Fall Restrictions Weight Bearing Restrictions: No Other Position/Activity Restrictions: WBAT    Mobility  Bed Mobility Overal bed mobility: Needs Assistance Bed Mobility: Sit to Supine       Sit to supine: Min assist   General bed mobility comments: cues for sequence and use of L LE to self assist; physical assist to manage R LE   Transfers Overall transfer level: Needs assistance Equipment used: Rolling walker (2 wheeled) Transfers: Sit to/from Stand Sit to Stand: Min assist         General transfer comment: cues for LE management and use of UEs to self assist  Ambulation/Gait Ambulation/Gait assistance: Min assist Ambulation Distance (Feet): 66 Feet Assistive device: Rolling walker (2 wheeled) Gait Pattern/deviations: Step-to pattern;Decreased step length - right;Shuffle;Trunk flexed;Antalgic Gait velocity: decr Gait velocity interpretation: Below normal speed for age/gender General Gait Details: cues for sequence, posture and position from RW.  Physical assist for balance/support and to advance L LE   Stairs            Wheelchair Mobility    Modified Rankin (Stroke Patients Only)       Balance Overall balance assessment: Needs assistance Sitting-balance support: No upper extremity supported;Feet supported Sitting balance-Leahy Scale: Fair     Standing balance support: Bilateral upper  extremity supported Standing balance-Leahy Scale: Poor                              Cognition Arousal/Alertness: Awake/alert Behavior During Therapy: WFL for tasks assessed/performed Overall Cognitive Status: Within Functional Limits for tasks assessed                                        Exercises      General Comments        Pertinent Vitals/Pain Pain Assessment: 0-10 Pain Score: 5  Pain Location: R hip Pain Descriptors / Indicators: Aching;Sore Pain Intervention(s): Limited activity within patient's tolerance;Monitored during session;Premedicated before session;Ice applied    Home Living                      Prior Function            PT Goals (current goals can now be found in the care plan section) Acute Rehab PT Goals Patient Stated Goal: Regain IND and walk with less pain PT Goal Formulation: With patient Time For Goal Achievement: 04/01/17 Potential to Achieve Goals: Good Progress towards PT goals: Progressing toward goals    Frequency    7X/week      PT Plan Current plan remains appropriate    Co-evaluation              AM-PAC PT "6 Clicks" Daily Activity  Outcome Measure  Difficulty turning over in bed (including adjusting  bedclothes, sheets and blankets)?: Unable Difficulty moving from lying on back to sitting on the side of the bed? : Unable Difficulty sitting down on and standing up from a chair with arms (e.g., wheelchair, bedside commode, etc,.)?: Unable Help needed moving to and from a bed to chair (including a wheelchair)?: A Lot Help needed walking in hospital room?: A Little Help needed climbing 3-5 steps with a railing? : A Little 6 Click Score: 11    End of Session Equipment Utilized During Treatment: Gait belt Activity Tolerance: Patient tolerated treatment well Patient left: in bed;with call bell/phone within reach Nurse Communication: Mobility status PT Visit Diagnosis: Unsteadiness  on feet (R26.81);Difficulty in walking, not elsewhere classified (R26.2)     Time: [id number] PT Time Calculation (min) (ACUTE ONLY): 29 min  Charges:  $Gait Training: 23-37 mins                    G Codes:       Pg [phone]    Stephanie [name] 03/28/2017, 4:13 PM

## 2017-03-28 NOTE — Progress Notes (Signed)
   Subjective: 1 Day Post-Op Procedure(s) (LRB): RIGHT TOTAL HIP ARTHROPLASTY ANTERIOR APPROACH (Right) Patient reports pain as mild.   Patient seen in rounds by Dr. Lequita HaltAluisio. Patient is well, but has had some minor complaints of pain in the hip, requiring pain medications We will start therapy today.  If they do well with therapy and meets all goals, then will allow home later this afternoon following therapy. Plan is to go Home after hospital stay.  Objective: Vital signs in last 24 hours: Temp:  [97.4 F (36.3 C)-98.6 F (37 C)] 97.7 F (36.5 C) (11/15 0610) Pulse Rate:  [62-73] 62 (11/15 0610) Resp:  [12-20] 16 (11/15 0610) BP: (105-142)/(65-91) 116/76 (11/15 0610) SpO2:  [97 %-100 %] 100 % (11/15 0610) Weight:  [83.5 kg (184 lb)] 83.5 kg (184 lb) (11/14 1218)  Intake/Output from previous day:  Intake/Output Summary (Last 24 hours) at 03/28/2017 0757 Last data filed at 03/28/2017 0611 Gross per 24 hour  Intake 3840 ml  Output 3365 ml  Net 475 ml    Intake/Output this shift: No intake/output data recorded.  Labs: Recent Labs    03/28/17 0514  HGB 10.1*   Recent Labs    03/28/17 0514  WBC 8.8  RBC 3.34*  HCT 29.6*  PLT 189   Recent Labs    03/28/17 0514  NA 136  K 3.6  CL 105  CO2 26  BUN 9  CREATININE 0.66  GLUCOSE 135*  CALCIUM 8.2*   No results for input(s): LABPT, INR in the last 72 hours.  EXAM General - Patient is Alert, Appropriate and Oriented Extremity - Neurovascular intact Sensation intact distally Intact pulses distally Dorsiflexion/Plantar flexion intact Dressing - dressing C/D/I Motor Function - intact, moving foot and toes well on exam.  Hemovac pulled without difficulty.  Past Medical History:  Diagnosis Date  . Allergic rhinitis   . Arthritis   . Asthma   . Depression   . Heart murmur   . Hypertension   . Pneumonia    hx of x 3     Assessment/Plan: 1 Day Post-Op Procedure(s) (LRB): RIGHT TOTAL HIP ARTHROPLASTY  ANTERIOR APPROACH (Right) Principal Problem:   Osteoarthritis, hip, bilateral Active Problems:   OA (osteoarthritis) of hip  Estimated body mass index is 31.58 kg/m as calculated from the following:   Height as of this encounter: 5\' 4"  (1.626 m).   Weight as of this encounter: 83.5 kg (184 lb). Advance diet Up with therapy Possible Discharge home with home health today  DVT Prophylaxis - Xarelto Weight Bearing As Tolerated right Leg Hemovac Pulled Begin Therapy  If meets goals and able to go home: Discharge home with home health Diet - Cardiac diet Follow up - in 2 weeks Activity - WBAT Disposition - Home Condition Upon Discharge - pending therapy and goals D/C Meds - See DC Summary DVT Prophylaxis - Xarelto  Avel Peacerew Perkins, PA-C Orthopaedic Surgery 03/28/2017, 7:57 AM
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
## 2017-03-29 LAB — BASIC METABOLIC PANEL
ANION GAP: 9 (ref 5–15)
BUN: 8 mg/dL (ref 6–20)
CALCIUM: 8.8 mg/dL — AB (ref 8.9–10.3)
CO2: 29 mmol/L (ref 22–32)
Chloride: 102 mmol/L (ref 101–111)
Creatinine, Ser: 0.59 mg/dL (ref 0.44–1.00)
GFR calc Af Amer: >60 mL/min (ref >60)
GLUCOSE: 109 mg/dL — AB (ref 65–99)
Potassium: 3.7 mmol/L (ref 3.5–5.1)
SODIUM: 140 mmol/L (ref 135–145)

## 2017-03-29 LAB — CBC
HCT: 27 % — ABNORMAL LOW (ref 36.0–46.0)
Hemoglobin: 9.4 g/dL — ABNORMAL LOW (ref 12.0–15.0)
MCH: 30.9 pg (ref 26.0–34.0)
MCHC: 34.8 g/dL (ref 30.0–36.0)
MCV: 88.8 fL (ref 78.0–100.0)
PLATELETS: 157 10*3/uL (ref 150–400)
RBC: 3.04 MIL/uL — AB (ref 3.87–5.11)
RDW: 14.3 % (ref 11.5–15.5)
WBC: 11.7 10*3/uL — AB (ref 4.0–10.5)

## 2017-03-29 NOTE — Plan of Care (Signed)
Plan of care discussed and reviewed with patient. Questions answered to patient's satisfaction. 

## 2017-03-29 NOTE — Progress Notes (Signed)
   Subjective: 2 Days Post-Op Procedure(s) (LRB): RIGHT TOTAL HIP ARTHROPLASTY ANTERIOR APPROACH (Right) Patient reports pain as mild.   Patient seen in rounds with Dr. Lequita HaltAluisio. Big difference already from prior to surgery. Doing well this morning. Patient is well, and has had no acute complaints or problems Patient is ready to go home today following therapy.  Objective: Vital signs in last 24 hours: Temp:  [97.4 F (36.3 C)-98.8 F (37.1 C)] 98.4 F (36.9 C) (11/16 0542) Pulse Rate:  [68-80] 72 (11/16 0542) Resp:  [16-18] 16 (11/16 0542) BP: (110-137)/(72-84) 125/80 (11/16 0542) SpO2:  [97 %-99 %] 99 % (11/16 0542)  Intake/Output from previous day:  Intake/Output Summary (Last 24 hours) at 03/29/2017 0730 Last data filed at 03/28/2017 2210 Gross per 24 hour  Intake 960 ml  Output 500 ml  Net 460 ml    Intake/Output this shift: No intake/output data recorded.  Labs: Recent Labs    03/28/17 0514 03/29/17 0531  HGB 10.1* 9.4*   Recent Labs    03/28/17 0514 03/29/17 0531  WBC 8.8 11.7*  RBC 3.34* 3.04*  HCT 29.6* 27.0*  PLT 189 157   Recent Labs    03/28/17 0514 03/29/17 0531  NA 136 140  K 3.6 3.7  CL 105 102  CO2 26 29  BUN 9 8  CREATININE 0.66 0.59  GLUCOSE 135* 109*  CALCIUM 8.2* 8.8*   No results for input(s): LABPT, INR in the last 72 hours.  EXAM: General - Patient is Alert, Appropriate and Oriented Extremity - Neurovascular intact Sensation intact distally Intact pulses distally Incision - clean, dry, no drainage Motor Function - intact, moving foot and toes well on exam.   Assessment/Plan: 2 Days Post-Op Procedure(s) (LRB): RIGHT TOTAL HIP ARTHROPLASTY ANTERIOR APPROACH (Right) Procedure(s) (LRB): RIGHT TOTAL HIP ARTHROPLASTY ANTERIOR APPROACH (Right) Past Medical History:  Diagnosis Date  . Allergic rhinitis   . Arthritis   . Asthma   . Depression   . Heart murmur   . Hypertension   . Pneumonia    hx of x 3    Principal  Problem:   Osteoarthritis, hip, bilateral Active Problems:   OA (osteoarthritis) of hip  Estimated body mass index is 31.58 kg/m as calculated from the following:   Height as of this encounter: 5\' 4"  (1.626 m).   Weight as of this encounter: 83.5 kg (184 lb). Up with therapy Discharge home with home health Diet - Cardiac diet Follow up - in 2 weeks Activity - WBAT Disposition - Home Condition Upon Discharge - Stable D/C Meds - See DC Summary DVT Prophylaxis - Xarelto  Avel Peacerew Perkins, PA-C Orthopaedic Surgery 03/29/2017, 7:30 AM

## 2017-03-29 NOTE — Evaluation (Signed)
Occupational Therapy Evaluation Patient Details Name: Stephanie [name] MRN: [id number] DOB: May 10, 1961 Today's Date: 03/29/2017    History of Present Illness Pt s/p R THR    Clinical Impression   This 56 year old female was admitted for the above. She will benefit from one more session of OT to further educate on bathroom transfers. ADL education completed this session. Pt was limited by pain    Follow Up Recommendations  Supervision/Assistance - 24 hour    Equipment Recommendations  None recommended by OT    Recommendations for Other Services       Precautions / Restrictions Precautions Precautions: Fall Restrictions Weight Bearing Restrictions: No Other Position/Activity Restrictions: WBAT      Mobility Bed Mobility         Supine to sit: Min assist     General bed mobility comments: for RLE and cues for sequence  Transfers   Equipment used: Rolling walker (2 wheeled)   Sit to Stand: Min guard         General transfer comment: cues for UE/LEplacement    Balance                                           ADL either performed or assessed with clinical judgement   ADL Overall ADL's : Needs assistance/impaired Eating/Feeding: Independent   Grooming: Set up;Sitting   Upper Body Bathing: Set up;Sitting   Lower Body Bathing: Min guard;Sit to/from stand;With adaptive equipment   Upper Body Dressing : Set up;Sitting   Lower Body Dressing: Moderate assistance;Sit to/from stand;With adaptive equipment   Toilet Transfer: Min guard;Stand-pivot;RW(chair)             General ADL Comments: pt got dressed. Practiced with AE, sock aide several times. She was successful when she got her foot into device as much as possible.  States she will be getting a kit (daughter has ordered). Her granddaughter was assisting with dressing prior to this     Vision         Perception     Praxis      Pertinent Vitals/Pain Pain Score: 6  Pain  Location: R hip Pain Descriptors / Indicators: Aching;Sore Pain Intervention(s): Limited activity within patient's tolerance;Monitored during session;Premedicated before session;Repositioned;Ice applied     Hand Dominance     Extremity/Trunk Assessment Upper Extremity Assessment Upper Extremity Assessment: Overall WFL for tasks assessed           Communication Communication Communication: No difficulties   Cognition Arousal/Alertness: Awake/alert Behavior During Therapy: WFL for tasks assessed/performed Overall Cognitive Status: Within Functional Limits for tasks assessed                                     General Comments       Exercises     Shoulder Instructions      Home Living Family/patient expects to be discharged to:: Private residence Living Arrangements: Children Available Help at Discharge: Family               Bathroom Shower/Tub: Walk-in Psychologist, prison and probation services: Standard     Home Equipment: Nurse, children's - 4 wheels;Wheelchair - manual;Bedside commode   Additional Comments: daughter has ordered her an AE kit      Prior Functioning/Environment Level of Independence: Independent with  assistive device(s)                 OT Problem List: Decreased activity tolerance;Pain      OT Treatment/Interventions: Self-care/ADL training;DME and/or AE instruction;Patient/family education    OT Goals(Current goals can be found in the care plan section) Acute Rehab OT Goals Patient Stated Goal: Regain IND and walk with less pain OT Goal Formulation: With patient Time For Goal Achievement: 03/30/17 Potential to Achieve Goals: Good ADL Goals Pt Will Transfer to Toilet: with supervision;ambulating;bedside commode Pt Will Perform Tub/Shower Transfer: Shower transfer;with min guard assist;ambulating;3 in 1  OT Frequency: Min 1X/week   Barriers to D/C:            Co-evaluation              AM-PAC PT "6 Clicks"  Daily Activity     Outcome Measure Help from another person eating meals?: None Help from another person taking care of personal grooming?: A Little Help from another person toileting, which includes using toliet, bedpan, or urinal?: A Little Help from another person bathing (including washing, rinsing, drying)?: A Little Help from another person to put on and taking off regular upper body clothing?: A Little Help from another person to put on and taking off regular lower body clothing?: A Lot 6 Click Score: 18   End of Session    Activity Tolerance: Patient limited by pain Patient left: in chair;with call bell/phone within reach  OT Visit Diagnosis: Pain Pain - Right/Left: Right Pain - part of body: Hip                Time: 8337-4451 OT Time Calculation (min): 28 min Charges:  OT General Charges $OT Visit: 1 Visit OT Evaluation $OT Eval Low Complexity: 1 Low OT Treatments $Self Care/Home Management : 8-22 mins G-Codes:     Rupert, OTR/L [phone] 03/29/2017  Gurney Balthazor 03/29/2017, 10:13 AM

## 2017-03-29 NOTE — Progress Notes (Signed)
   03/29/17 1100  OT Visit Information  Last OT Received On 03/29/17  Assistance Needed +1  History of Present Illness Pt s/p R THR   Precautions  Precautions Fall  Pain Assessment  Pain Score 6  Pain Location R hip  Pain Descriptors / Indicators Aching;Sore  Pain Intervention(s) Limited activity within patient's tolerance;Monitored during session;Repositioned;Premedicated before session;Ice applied  Cognition  Arousal/Alertness Awake/alert  Behavior During Therapy WFL for tasks assessed/performed  Overall Cognitive Status Within Functional Limits for tasks assessed  ADL  Tub/ Engineer, structuralhower Transfer Walk-in shower;Minimal assistance;Ambulation  General ADL Comments pt was in the bathroom with NT. She had used commode.  Practiced shower transfer. Min guard to get in and min A to get out as pt had difficulty lifting RLE out of shower.  Had NT stabilize walker and OT assisted with lifting leg out.  Talked about position of 3:1 in shower.   Restrictions  Other Position/Activity Restrictions WBAT  Transfers  General transfer comment pt was standing with NT  OT - End of Session  Activity Tolerance Patient tolerated treatment well  Patient left in chair;with call bell/phone within reach  OT Assessment/Plan  OT Visit Diagnosis Pain  Pain - Right/Left Right  Pain - part of body Hip  Follow Up Recommendations Supervision/Assistance - 24 hour  OT Equipment None recommended by OT  AM-PAC OT "6 Clicks" Daily Activity Outcome Measure  Help from another person eating meals? 4  Help from another person taking care of personal grooming? 3  Help from another person toileting, which includes using toliet, bedpan, or urinal? 3  Help from another person bathing (including washing, rinsing, drying)? 3  Help from another person to put on and taking off regular upper body clothing? 3  Help from another person to put on and taking off regular lower body clothing? 2  6 Click Score 18  ADL G Code Conversion CK   OT Goal Progression  Progress towards OT goals Progressing toward goals (no further OT needs at this time)  OT Time Calculation  OT Start Time (ACUTE ONLY) 1135  OT Stop Time (ACUTE ONLY) 1149  OT Time Calculation (min) 14 min  OT General Charges  $OT Visit 1 Visit  OT Treatments  $Self Care/Home Management  8-22 mins  Marica OtterMaryellen Cantrell Martus, OTR/L [phone] 03/29/2017

## 2017-03-29 NOTE — Progress Notes (Signed)
Physical Therapy Treatment Patient Details Name: Stephanie [name] MRN: [id number] DOB: 1961-04-09 Today's Date: 03/29/2017    History of Present Illness Pt s/p R THR     PT Comments    Pt motivated and progressing with mobility despite escalating pain level.  Reviewed bed mobility, stairs and car transfers.   Follow Up Recommendations  DC plan and follow up therapy as arranged by surgeon;Home health PT     Equipment Recommendations  None recommended by PT    Recommendations for Other Services OT consult     Precautions / Restrictions Precautions Precautions: Fall Restrictions Weight Bearing Restrictions: No Other Position/Activity Restrictions: WBAT    Mobility  Bed Mobility Overal bed mobility: Needs Assistance Bed Mobility: Sit to Supine       Sit to supine: Min assist   General bed mobility comments: for RLE and cues for sequence  Transfers Overall transfer level: Needs assistance Equipment used: Rolling walker (2 wheeled) Transfers: Sit to/from Stand Sit to Stand: Min guard         General transfer comment: min cues for sequence and use of UEs  Ambulation/Gait Ambulation/Gait assistance: Min guard;Supervision Ambulation Distance (Feet): 70 Feet Assistive device: Rolling walker (2 wheeled) Gait Pattern/deviations: Step-to pattern;Decreased step length - right;Shuffle;Trunk flexed;Antalgic Gait velocity: decr Gait velocity interpretation: Below normal speed for age/gender General Gait Details: cues for sequence, posture and position from RW.  Physical assist for balance/support and to advance L LE   Stairs Stairs: Yes   Stair Management: No rails;Step to pattern;Forwards;With walker Number of Stairs: 2 General stair comments: single step twice with cues for sequence and foot/RW placement  Wheelchair Mobility    Modified Rankin (Stroke Patients Only)       Balance Overall balance assessment: Needs assistance Sitting-balance support: No  upper extremity supported;Feet supported Sitting balance-Leahy Scale: Good     Standing balance support: Bilateral upper extremity supported Standing balance-Leahy Scale: Fair                              Cognition Arousal/Alertness: Awake/alert Behavior During Therapy: WFL for tasks assessed/performed Overall Cognitive Status: Within Functional Limits for tasks assessed                                        Exercises Total Joint Exercises Ankle Circles/Pumps: AROM;Both;15 reps;Supine Heel Slides: AAROM;Right;Supine;5 reps    General Comments        Pertinent Vitals/Pain Pain Assessment: 0-10 Pain Score: 8  Pain Location: R hip Pain Descriptors / Indicators: Aching;Sore Pain Intervention(s): Limited activity within patient's tolerance;Monitored during session;Premedicated before session;Ice applied    Home Living                      Prior Function            PT Goals (current goals can now be found in the care plan section) Acute Rehab PT Goals Patient Stated Goal: Regain IND and walk with less pain PT Goal Formulation: With patient Time For Goal Achievement: 04/01/17 Potential to Achieve Goals: Good Progress towards PT goals: Progressing toward goals    Frequency    7X/week      PT Plan Current plan remains appropriate    Co-evaluation              AM-PAC PT "6 Clicks" Daily  Activity  Outcome Measure  Difficulty turning over in bed (including adjusting bedclothes, sheets and blankets)?: Unable Difficulty moving from lying on back to sitting on the side of the bed? : Unable Difficulty sitting down on and standing up from a chair with arms (e.g., wheelchair, bedside commode, etc,.)?: Unable Help needed moving to and from a bed to chair (including a wheelchair)?: A Little Help needed walking in hospital room?: A Little Help needed climbing 3-5 steps with a railing? : A Little 6 Click Score: 12    End of  Session Equipment Utilized During Treatment: Gait belt Activity Tolerance: Patient tolerated treatment well Patient left: in bed;with call bell/phone within reach Nurse Communication: Mobility status PT Visit Diagnosis: Unsteadiness on feet (R26.81);Difficulty in walking, not elsewhere classified (R26.2)     Time: 1010-1048 PT Time Calculation (min) (ACUTE ONLY): 38 min  Charges:  $Gait Training: 23-37 mins $Therapeutic Activity: 8-22 mins                    G Codes:       Pg [phone]    Fallon Haecker 03/29/2017, 2:58 PM

## 2017-04-02 ENCOUNTER — Telehealth: Payer: Self-pay | Admitting: Family Medicine

## 2017-04-02 NOTE — Telephone Encounter (Signed)
Patient stated she is getting Dr Despina HickAlusio to sen the prescription to her home health nurse and will call back if any problems.

## 2017-04-02 NOTE — Telephone Encounter (Signed)
Requesting Rx for support hose to [facility].  She said they fit her and she was sized Medium.  She has to wear these for 6 weeks.  She was given a pair after her surgery, but the one pair is not enough.  She wanted to know if Dr. Lorin PicketScott could do this for her or if she needs to call her surgeon.  She also wanted to let Dr. Lorin PicketScott know she is doing good after surgery.

## 2017-04-02 NOTE — Telephone Encounter (Signed)
It would be fine to go ahead and write a prescription for this-if the patient needs this faxed to health supply store she should let us know please handle thank you

## 2017-04-15 ENCOUNTER — Ambulatory Visit: Payer: BLUE CROSS/BLUE SHIELD | Admitting: Family Medicine

## 2017-04-15 ENCOUNTER — Encounter: Payer: Self-pay | Admitting: Family Medicine

## 2017-04-15 VITALS — BP 124/86 | Ht 64.0 in | Wt 188.0 lb

## 2017-04-15 DIAGNOSIS — M160 Bilateral primary osteoarthritis of hip: Secondary | ICD-10-CM

## 2017-04-15 DIAGNOSIS — I10 Essential (primary) hypertension: Secondary | ICD-10-CM
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
## 2017-04-17 ENCOUNTER — Telehealth: Payer: Self-pay | Admitting: Family Medicine

## 2017-04-17 NOTE — Telephone Encounter (Signed)
Patient has 38 pain pills left.  She was to call and let Dr. Lorin PicketScott know.

## 2017-04-17 NOTE — Telephone Encounter (Signed)
Patient may have 2 prescriptions of oxycodone 5 mg / 325 mg, #120, take 1 pill every 6 hours as needed pain, do not exceed 4 tablets/day, cautioned drowsiness.  The dates for filling her prescriptions can be April 27, 2018 and May 27, 2017 the patient can notify us when she is getting lower on her medicine and we will issue her additional prescriptions to get her through to her next follow-up office visit.  The drug registry was checked and verified.  Remind the patient that as her pain gets better she should try to taper down gradually on the pain medicine.

## 2017-04-17 NOTE — Telephone Encounter (Signed)
I spoke with the pt and she states she was asked by you to please call us and let us know how many oxycodone 5 mg she had left. She has # 38 left and she takes 3-4 tablets daily.

## 2017-04-18 MED ORDER — OXYCODONE-ACETAMINOPHEN 5-325 MG PO TABS: ORAL_TABLET | ORAL | 0 refills | Status: DC

## 2017-04-18 NOTE — Telephone Encounter (Signed)
Awaiting the signature.

## 2017-04-18 NOTE — Telephone Encounter (Signed)
Patient is aware 

## 2017-04-24 ENCOUNTER — Telehealth: Payer: Self-pay | Admitting: Family Medicine

## 2017-04-24 DIAGNOSIS — M25561 Pain in right knee: Secondary | ICD-10-CM

## 2017-04-24 DIAGNOSIS — M25562 Pain in left knee: Principal | ICD-10-CM

## 2017-04-24 NOTE — Telephone Encounter (Signed)
Referral put in to [facility] per pt
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
## 2017-04-24 NOTE — Telephone Encounter (Signed)
May go ahead with referral to orthopedics of her choice

## 2017-05-09 ENCOUNTER — Ambulatory Visit: Payer: BLUE CROSS/BLUE SHIELD | Admitting: Cardiovascular Disease

## 2017-06-13 ENCOUNTER — Telehealth: Payer: Self-pay | Admitting: Family Medicine

## 2017-06-13 NOTE — Telephone Encounter (Signed)
Patient dropped off disability form to be filled out.Please fill in highlighted areas. I filled in what I knew and could. Review,date and sign.In your yellow folder.

## 2017-06-17 ENCOUNTER — Telehealth: Payer: Self-pay | Admitting: Family Medicine

## 2017-06-17 NOTE — Telephone Encounter (Signed)
This patient is permanently disabled.  Please discuss with her when she stopped working because of the disability for question 1/be, also discussed with her question 5 regarding when she came out of work so that could be filled in as well.  Otherwise the form is completed thank you

## 2017-07-16 ENCOUNTER — Ambulatory Visit: Payer: BLUE CROSS/BLUE SHIELD | Admitting: Family Medicine

## 2017-08-08 ENCOUNTER — Telehealth: Payer: Self-pay | Admitting: Family Medicine

## 2017-08-08 NOTE — Telephone Encounter (Signed)
Patient is requesting a refill on his oxyCODONE-acetaminophen (ROXICET) 5-325 MG tablet.  She has 4 pills left.

## 2017-08-09 DIAGNOSIS — M25561 Pain in right knee: Secondary | ICD-10-CM | POA: Diagnosis not present

## 2017-08-09 DIAGNOSIS — M170 Bilateral primary osteoarthritis of knee: Secondary | ICD-10-CM | POA: Diagnosis not present

## 2017-08-09 DIAGNOSIS — M1712 Unilateral primary osteoarthritis, left knee: Secondary | ICD-10-CM | POA: Diagnosis not present

## 2017-08-09 DIAGNOSIS — M25562 Pain in left knee: Secondary | ICD-10-CM | POA: Diagnosis not present

## 2017-08-09 DIAGNOSIS — M1711 Unilateral primary osteoarthritis, right knee: Secondary | ICD-10-CM | POA: Diagnosis not present

## 2017-08-09 MED ORDER — OXYCODONE-ACETAMINOPHEN 5-325 MG PO TABS: ORAL_TABLET | ORAL | 0 refills | Status: DC

## 2017-08-09 NOTE — Telephone Encounter (Signed)
Patient is aware we have rx here for her to pick up. Also states she has an appt already April 2 and will keep it.

## 2017-08-09 NOTE — Telephone Encounter (Signed)
Patient may have a prescription of her pain medicine, 1 4 times daily, #60, needs to schedule an office visit.  More than likely we will need to see her the week of April 8

## 2017-08-13 ENCOUNTER — Ambulatory Visit: Payer: BLUE CROSS/BLUE SHIELD | Admitting: Family Medicine

## 2017-08-19 ENCOUNTER — Encounter: Payer: Self-pay | Admitting: Family Medicine

## 2017-08-19 ENCOUNTER — Ambulatory Visit (INDEPENDENT_AMBULATORY_CARE_PROVIDER_SITE_OTHER): Payer: Medicare Other | Admitting: Family Medicine

## 2017-08-19 VITALS — BP 128/82 | Ht 64.0 in | Wt 190.0 lb

## 2017-08-19 DIAGNOSIS — I10 Essential (primary) hypertension: Secondary | ICD-10-CM | POA: Diagnosis not present

## 2017-08-19 DIAGNOSIS — M160 Bilateral primary osteoarthritis of hip: Secondary | ICD-10-CM

## 2017-08-19 MED ORDER — CITALOPRAM HYDROBROMIDE 20 MG PO TABS: ORAL_TABLET | ORAL | 5 refills | Status: DC

## 2017-08-19 MED ORDER — TRIAMTERENE-HCTZ 37.5-25 MG PO TABS: 1.0000 | ORAL_TABLET | Freq: Every day | ORAL | 5 refills | Status: DC

## 2017-08-19 MED ORDER — OXYCODONE-ACETAMINOPHEN 5-325 MG PO TABS: ORAL_TABLET | ORAL | 0 refills | Status: DC

## 2017-08-19 MED ORDER — PREDNISONE 20 MG PO TABS: ORAL_TABLET | ORAL | 0 refills | Status: DC

## 2017-08-19 NOTE — Progress Notes (Signed)
Subjective:    Patient ID: Stephanie [name], female    DOB: 04-24-1961, 57 y.o.   MRN: [id number]  HPI  This patient was seen today for chronic pain  The medication list was reviewed and updated.   -Compliance with medication: yes  - Number patient states they take daily: 3 a day  -when was the last dose patient took? today  The patient was advised the importance of maintaining medication and not using illegal substances with these.  Here for refills and follow up  The patient was educated that we can provide 3 monthly scripts for their medication, it is their responsibility to follow the instructions.  Side effects or complications from medications: none  Patient is aware that pain medications are meant to minimize the severity of the pain to allow their pain levels to improve to allow for better function. They are aware of that pain medications cannot totally remove their pain.  Due for UDT ( at least once per year) : do at follow up visit in one month per Dr Lorin Picket  Patient for blood pressure check up. Patient relates compliance with meds. Todays BP reviewed with the patient. Patient denies issues with medication. Patient relates reasonable diet. Patient tries to minimize salt. Patient aware of BP goals.  Going to have another hip replacement coming up soon patient with chronic pain in the back along with sciatica.  Also has hip pain.  Recent surgery   is patient is here today regarding follow-up regarding depression.  Patient relates compliance with medication.  Patient understands importance of taking the medication.  Patient denies any significant side effects.  Patient relates that the medication is still beneficial for them and they would like to continue the medication.  Patient is not having any threats of homicide or suicide.  Patient also has right shoulder pain discomfort with stiffness pain with movement this occurred 2 days ago no known injury.  I did show her some  exercise I gave her a pamphlet we will do a short course of prednisone she cannot do NSAIDs  Pulmonary is stable patient cannot afford her medicines currently     Review of Systems  Constitutional: Positive for fever. Negative for activity change, appetite change and fatigue.  HENT: Negative for congestion and sore throat.   Respiratory: Negative for cough and shortness of breath.   Cardiovascular: Negative for chest pain.  Gastrointestinal: Positive for diarrhea. Negative for abdominal pain, nausea and vomiting.  Endocrine: Negative for polydipsia and polyphagia.  Genitourinary: Negative for dysuria, flank pain and frequency.  Musculoskeletal: Positive for arthralgias and back pain.  Skin: Negative for color change.  Neurological: Negative for weakness.  Psychiatric/Behavioral: Negative for confusion.       Objective:   Physical Exam  Constitutional: She appears well-developed and well-nourished. No distress.  HENT:  Head: Normocephalic and atraumatic.  Eyes: Right eye exhibits no discharge. Left eye exhibits no discharge.  Neck: No tracheal deviation present.  Cardiovascular: Normal rate, regular rhythm and normal heart sounds.  No murmur heard. Pulmonary/Chest: Effort normal and breath sounds normal. No respiratory distress. She has no wheezes. She has no rales.  Musculoskeletal: She exhibits no edema.  Lymphadenopathy:    She has no cervical adenopathy.  Neurological: She is alert. She exhibits normal muscle tone.  Skin: Skin is warm and dry. No erythema.  Psychiatric: Her behavior is normal.  Vitals reviewed.  Shoulder stiffness noted       Assessment & Plan:  Shoulder stiffness  range of motion exercises shown prednisone taper  Chronic low back pain and discomfort along with hip pain does not abuse medicine drug registry was checked maximum use 3 times per day 3 prescriptions given follow-up within 3 months  Depression stable patient prefers to continue Celexa  prescription given  Blood pressure good control continue current medications.  Follow-up within 6 months

## 2017-09-03 ENCOUNTER — Ambulatory Visit: Payer: BLUE CROSS/BLUE SHIELD | Admitting: Allergy and Immunology

## 2017-09-23 ENCOUNTER — Ambulatory Visit: Payer: BLUE CROSS/BLUE SHIELD | Admitting: Family Medicine

## 2017-09-24 ENCOUNTER — Ambulatory Visit: Payer: BLUE CROSS/BLUE SHIELD | Admitting: Allergy and Immunology

## 2017-09-26 DIAGNOSIS — M1612 Unilateral primary osteoarthritis, left hip: Secondary | ICD-10-CM | POA: Diagnosis not present

## 2017-09-26 DIAGNOSIS — M170 Bilateral primary osteoarthritis of knee: Secondary | ICD-10-CM | POA: Diagnosis not present

## 2017-09-26 DIAGNOSIS — Z96641 Presence of right artificial hip joint: Secondary | ICD-10-CM | POA: Diagnosis not present

## 2017-10-15 ENCOUNTER — Ambulatory Visit: Payer: BLUE CROSS/BLUE SHIELD | Admitting: Allergy and Immunology

## 2017-10-28 ENCOUNTER — Ambulatory Visit: Payer: BLUE CROSS/BLUE SHIELD | Admitting: Family Medicine

## 2017-10-29 ENCOUNTER — Ambulatory Visit: Payer: BLUE CROSS/BLUE SHIELD | Admitting: Allergy and Immunology

## 2017-11-11 ENCOUNTER — Ambulatory Visit (INDEPENDENT_AMBULATORY_CARE_PROVIDER_SITE_OTHER): Payer: Medicare Other | Admitting: Family Medicine

## 2017-11-11 ENCOUNTER — Encounter: Payer: Self-pay | Admitting: Family Medicine

## 2017-11-11 VITALS — BP 132/84 | Ht 64.0 in | Wt 193.0 lb

## 2017-11-11 DIAGNOSIS — Z13220 Encounter for screening for lipoid disorders: Secondary | ICD-10-CM

## 2017-11-11 DIAGNOSIS — Z79891 Long term (current) use of opiate analgesic: Secondary | ICD-10-CM

## 2017-11-11 DIAGNOSIS — Z79899 Other long term (current) drug therapy: Secondary | ICD-10-CM | POA: Diagnosis not present

## 2017-11-11 DIAGNOSIS — D649 Anemia, unspecified: Secondary | ICD-10-CM

## 2017-11-11 DIAGNOSIS — M160 Bilateral primary osteoarthritis of hip: Secondary | ICD-10-CM

## 2017-11-11 DIAGNOSIS — G894 Chronic pain syndrome: Secondary | ICD-10-CM | POA: Diagnosis not present

## 2017-11-11 DIAGNOSIS — I10 Essential (primary) hypertension: Secondary | ICD-10-CM

## 2017-11-11 MED ORDER — OXYCODONE-ACETAMINOPHEN 5-325 MG PO TABS: ORAL_TABLET | ORAL | 0 refills | Status: DC

## 2017-11-11 NOTE — Progress Notes (Signed)
Subjective:    Patient ID: Stephanie [name], female    DOB: 1960/05/23, 57 y.o.   MRN: [id number]  HPI This patient was seen today for chronic pain. Takes for back and hip pain.   The medication list was reviewed and updated.   -Compliance with medication: yes  - Number patient states they take daily: 3 a day  -when was the last dose patient took? today  The patient was advised the importance of maintaining medication and not using illegal substances with these.  Here for refills and follow up  The patient was educated that we can provide 3 monthly scripts for their medication, it is their responsibility to follow the instructions.  Side effects or complications from medications: makes sleepy  Patient is aware that pain medications are meant to minimize the severity of the pain to allow their pain levels to improve to allow for better function. They are aware of that pain medications cannot totally remove their pain.  Due for UDT ( at least once per year) : due today  Patient for blood pressure check up.  The patient does have hypertension.  The patient is on medication.  Patient relates compliance with meds. Todays BP reviewed with the patient. Patient denies issues with medication. Patient relates reasonable diet. Patient tries to minimize salt. Patient aware of BP goals.  Patient with significant bilateral hip pain and also low back pain with sciatica she is disabled from this she understands pain medicine does not completely remove the pain but it does make it where she is more functional  She does have some depression and anxiety but feels her medication is doing adequate she does not want to increase a dose of her medicines she feels she gets enough support from her family        Review of Systems  Constitutional: Negative for activity change, appetite change and fatigue.  HENT: Negative for congestion and rhinorrhea.   Respiratory: Negative for cough and shortness of  breath.   Cardiovascular: Negative for chest pain and leg swelling.  Gastrointestinal: Negative for abdominal pain and constipation.  Endocrine: Negative for polydipsia and polyphagia.  Musculoskeletal: Positive for arthralgias and back pain.  Skin: Negative for color change.  Neurological: Negative for weakness.  Psychiatric/Behavioral: Negative for confusion.       Objective:   Physical Exam  Constitutional: She appears well-nourished. No distress.  Cardiovascular: Normal rate, regular rhythm and normal heart sounds.  No murmur heard. Pulmonary/Chest: Effort normal and breath sounds normal. No respiratory distress.  Musculoskeletal: She exhibits no edema.  Lymphadenopathy:    She has no cervical adenopathy.  Neurological: She is alert. She exhibits normal muscle tone.  Psychiatric: Her behavior is normal.  Vitals reviewed.  Subjective discomfort in the lower back with negative straight leg raise patient walks with a cane has difficulty getting on and off exam table       Assessment & Plan:  Stress/depression/anxiety-doing well on medicine continue medication  Blood pressure reasonable control watch salt in diet stay active try to lose weight  Anxiety depression stable on current medication patient does not want to go up on medication  The patient was seen in followup for chronic pain. A review over at their current pain status was discussed. Drug registry was checked. Prescriptions were given. Discussion was held regarding the importance of compliance with medication as well as pain medication contract.  Time for questions regarding pain management plan occurred. Importance of regular followup visits was discussed.  Patient was informed that medication may cause drowsiness and should not be combined  with other medications/alcohol or street drugs. Patient was cautioned that medication could cause drowsiness. If the patient feels medication is causing altered alertness  then do not drive or operate dangerous equipment.  Drug registry was checked importance of safety with medication discussed plus also pain contract discussed today

## 2017-11-16 LAB — SPECIMEN STATUS REPORT

## 2017-11-16 LAB — TOXASSURE SELECT 13 (MW), URINE

## 2017-11-19 ENCOUNTER — Encounter: Payer: Self-pay | Admitting: Allergy and Immunology

## 2017-11-19 ENCOUNTER — Ambulatory Visit (INDEPENDENT_AMBULATORY_CARE_PROVIDER_SITE_OTHER): Payer: Medicare Other | Admitting: Allergy and Immunology

## 2017-11-19 VITALS — BP 132/86 | HR 84 | Resp 16

## 2017-11-19 DIAGNOSIS — H1010 Acute atopic conjunctivitis, unspecified eye: Secondary | ICD-10-CM

## 2017-11-19 DIAGNOSIS — Z886 Allergy status to analgesic agent status: Secondary | ICD-10-CM | POA: Diagnosis not present

## 2017-11-19 DIAGNOSIS — J3089 Other allergic rhinitis: Secondary | ICD-10-CM | POA: Diagnosis not present

## 2017-11-19 DIAGNOSIS — J4540 Moderate persistent asthma, uncomplicated: Secondary | ICD-10-CM | POA: Diagnosis not present

## 2017-11-19 DIAGNOSIS — J309 Allergic rhinitis, unspecified: Secondary | ICD-10-CM

## 2017-11-19 MED ORDER — MONTELUKAST SODIUM 10 MG PO TABS: 10.0000 mg | ORAL_TABLET | Freq: Every day | ORAL | 5 refills | Status: DC

## 2017-11-19 MED ORDER — ALBUTEROL SULFATE HFA 108 (90 BASE) MCG/ACT IN AERS: 1.0000 | INHALATION_SPRAY | RESPIRATORY_TRACT | 1 refills | Status: DC | PRN

## 2017-11-19 MED ORDER — BECLOMETHASONE DIPROP HFA 80 MCG/ACT IN AERB: INHALATION_SPRAY | RESPIRATORY_TRACT | 5 refills | Status: DC

## 2017-11-19 NOTE — Patient Instructions (Addendum)
  1. Continue to Treat inflammation:   A. QVAR 80 HFA - 2 inhalations 1-2 times per day (samples)  B. montelukast 10 mg one tablet one time per day   C. OTC Nasacort   one spray each nostril 3-7 times per week (samples)  2. Continue ProAir HFA or similar 2 puffs every 4-6 hours if needed  3. Continue OTC antihistamine and Epi-Pen if needed   4. Return to clinic in 6 months or earlier if problem  5. Obtain fall flu vaccine  6. NO NSAID use

## 2017-11-19 NOTE — Progress Notes (Signed)
Follow-up Note  Referring Provider: Babs SciaraLuking, Scott A, MD Primary Provider: Babs SciaraLuking, Scott A, MD Date of Office Visit: 11/19/2017  Subjective:   Stephanie [name] (DOB: [birth year]) is a 57 y.o. female who [facility] in re-evaluation of the following:  HPI: Stephanie [name] presents to this clinic in evaluation of asthma and allergic rhinitis and nonsteroidal anti-inflammatory drug induced allergy.  Her last visit to this clinic was 30 October 2016.  Her asthma does relatively well as long as she remains on a inhaler but unfortunately her Breo ended up giving rise to headaches and she did not have enough financial resources to continue to use Breo and thus she discontinued this agent over the course of the past 2 months and has had an increase in her requirement for using a short acting bronchodilator currently averaging out to about every other day.  It does not sound as though she has required a systemic steroid to treat an exacerbation of asthma during this timeframe.  Her nose is doing relatively well as long she continues on montelukast and a nasal steroid.  She has had a little bit irritation develop within her nose while using Nasacort every day.  She remains away from all nonsteroidal anti-inflammatory drugs.  Allergies as of 11/19/2017      Reactions   Aspirin Anaphylaxis   Hydrocodone Itching      Latex Hives, Itching      Medication List      citalopram 20 MG tablet Commonly known as:  CELEXA One half tablet qam for 7 days, then one tablet qam   EPIPEN 2-PAK 0.3 mg/0.3 mL Soaj injection Generic drug:  EPINEPHrine Inject 0.3 mg into the muscle once as needed (allergic reaction).   fluticasone furoate-vilanterol 200-25 MCG/INH Aepb Commonly known as:  BREO ELLIPTA Inhale 1 puff into the lungs daily. Rinse, gargle, and spit after use.   montelukast 10 MG tablet Commonly known as:  SINGULAIR Take one tablet once daily.    oxyCODONE-acetaminophen 5-325 MG tablet Commonly known as:  ROXICET One pill po tid prn Cautioned drowsiness.   PROVENTIL HFA 108 (90 Base) MCG/ACT inhaler Generic drug:  albuterol Inhale 1-2 puffs into the lungs every 4 (four) hours as needed for wheezing or shortness of breath.   triamterene-hydrochlorothiazide 37.5-25 MG tablet Commonly known as:  MAXZIDE-25 Take 1 tablet by mouth daily.       Past Medical History:  Diagnosis Date  . Allergic rhinitis   . Arthritis   . Asthma   . Depression   . Heart murmur   . Hypertension   . Pneumonia    hx of x 3     Past Surgical History:  Procedure Laterality Date  . CESAREAN SECTION  1985, 1989, 1995  . TONSILLECTOMY    . TOTAL HIP ARTHROPLASTY Right 03/27/2017   Procedure: RIGHT TOTAL HIP ARTHROPLASTY ANTERIOR APPROACH;  Surgeon: Ollen GrossAluisio, Frank, MD;  Location: [facility];  Service: Orthopedics;  Laterality: Right;    Review of systems negative except as noted in HPI / PMHx or noted below:  Review of Systems  Constitutional: Negative.   HENT: Negative.   Eyes: Negative.   Respiratory: Negative.   Cardiovascular: Negative.   Gastrointestinal: Negative.   Genitourinary: Negative.   Musculoskeletal: Negative.   Skin: Negative.   Neurological: Negative.   Endo/Heme/Allergies: Negative.   Psychiatric/Behavioral: Negative.      Objective:   Vitals:   11/19/17 1653  BP: 132/86  Pulse: 84  Resp: 16          Physical Exam  HENT:  Head: Normocephalic.  Right Ear: Tympanic membrane, external ear and ear canal normal.  Left Ear: Tympanic membrane, external ear and ear canal normal.  Nose: Nose normal. No mucosal edema or rhinorrhea.  Mouth/Throat: Uvula is midline, oropharynx is clear and moist and mucous membranes are normal. No oropharyngeal exudate.  Eyes: Conjunctivae are normal.  Neck: Trachea normal. No tracheal tenderness present. No tracheal deviation present. No thyromegaly present.  Cardiovascular: Normal  rate, regular rhythm, S1 normal, S2 normal and normal heart sounds.  No murmur heard. Pulmonary/Chest: Breath sounds normal. No stridor. No respiratory distress. She has no wheezes. She has no rales.  Musculoskeletal: She exhibits no edema.  Lymphadenopathy:       Head (right side): No tonsillar adenopathy present.       Head (left side): No tonsillar adenopathy present.    She has no cervical adenopathy.  Neurological: She is alert.  Skin: No rash noted. She is not diaphoretic. No erythema. Nails show no clubbing.    Diagnostics:    Spirometry was performed and demonstrated an FEV1 of 1.69 at 77 % of predicted.  Assessment and Plan:   1. Not well controlled moderate persistent asthma   2. Other allergic rhinitis   3. Allergy to NSAIDs   4. Allergic rhinoconjunctivitis     1. Continue to Treat inflammation:   A. QVAR 80 HFA - 2 inhalations 1-2 times per day (samples)  B. montelukast 10 mg one tablet one time per day   C. OTC Nasacort   one spray each nostril 3-7 times per week (samples)  2. Continue ProAir HFA or similar 2 puffs every 4-6 hours if needed  3. Continue OTC antihistamine and Epi-Pen if needed   4. Return to clinic in 6 months or earlier if problem  5. Obtain fall flu vaccine  6. NO NSAID use  I am not sure that normal will be able to restart Breo given the fact that she develops a headache when using this medication.  I did have samples of Qvar in the office today and I have given started her on this medication. She does not have any insurance coverage for medications.  She has the option of using Qvar 1 or 2 times per day depending on disease activity.  As well, she can decrease her nasal steroid to 1 spray each nostril just a few times per week as she does appear to be developing some irritation of her nose when using this on a regular basis.  I will see her back in this clinic in 6 months or earlier if there is a problem. Laurette Schimke, MD Allergy /  Immunology [facility]

## 2017-11-20 ENCOUNTER — Encounter: Payer: Self-pay | Admitting: Allergy and Immunology

## 2018-02-18 ENCOUNTER — Ambulatory Visit: Payer: Medicare Other | Admitting: Allergy and Immunology

## 2018-04-01 ENCOUNTER — Ambulatory Visit: Payer: Medicare Other | Admitting: Allergy and Immunology

## 2018-04-21 ENCOUNTER — Ambulatory Visit: Payer: Medicare Other | Admitting: Family Medicine

## 2018-04-29 ENCOUNTER — Ambulatory Visit (INDEPENDENT_AMBULATORY_CARE_PROVIDER_SITE_OTHER): Payer: Medicare Other | Admitting: Family Medicine

## 2018-04-29 ENCOUNTER — Encounter: Payer: Self-pay | Admitting: Family Medicine

## 2018-04-29 ENCOUNTER — Other Ambulatory Visit: Payer: Self-pay | Admitting: *Deleted

## 2018-04-29 VITALS — BP 118/74 | Temp 101.8°F | Ht 64.0 in | Wt 185.0 lb

## 2018-04-29 DIAGNOSIS — J329 Chronic sinusitis, unspecified: Secondary | ICD-10-CM

## 2018-04-29 DIAGNOSIS — J310 Chronic rhinitis: Secondary | ICD-10-CM

## 2018-04-29 MED ORDER — ALBUTEROL SULFATE HFA 108 (90 BASE) MCG/ACT IN AERS: 2.0000 | INHALATION_SPRAY | RESPIRATORY_TRACT | 0 refills | Status: DC | PRN

## 2018-04-29 MED ORDER — TRIAMTERENE-HCTZ 37.5-25 MG PO TABS: 1.0000 | ORAL_TABLET | Freq: Every day | ORAL | 0 refills | Status: DC

## 2018-04-29 MED ORDER — CITALOPRAM HYDROBROMIDE 20 MG PO TABS: ORAL_TABLET | ORAL | 0 refills | Status: DC

## 2018-04-29 MED ORDER — AMOXICILLIN-POT CLAVULANATE 875-125 MG PO TABS: 1.0000 | ORAL_TABLET | Freq: Two times a day (BID) | ORAL | 0 refills | Status: DC

## 2018-04-29 MED ORDER — BENZONATATE 100 MG PO CAPS: 100.0000 mg | ORAL_CAPSULE | Freq: Three times a day (TID) | ORAL | 0 refills | Status: DC | PRN

## 2018-04-29 NOTE — Progress Notes (Signed)
   Subjective:    Patient ID: Stephanie [name], female    DOB: Sep 19, 1960, 57 y.o.   MRN: [id number]  Fever   This is a new problem. Episode onset: 9 days. The maximum temperature noted was 101 to 101.9 F. The temperature was taken using an oral thermometer. Associated symptoms include abdominal pain, coughing, diarrhea and headaches. Associated symptoms comments: Weakness, nausea, no appetitie, cold and hot. . She has tried acetaminophen for the symptoms.   Needs refill on triamterene-hctz and celexa. Last med check up was 11/11/17. Has upcoming appt in January.   Last wwekend woke up with diarrhea  Wynelle LinkSun got better  Got to feeling better  Til Sunday  Then Sunday felt tired, felt worse, with substantial cough  No flu shot this yr  Head ache whoe head    No achey in muscles or joints Pos cough productive in nature   Non smoker    Zero appetite   Zero energy  Was I around  Sickness  And had other exposures   Review of Systems  Constitutional: Positive for fever.  Respiratory: Positive for cough.   Gastrointestinal: Positive for abdominal pain and diarrhea.  Neurological: Positive for headaches.       Objective:   Physical Exam  Alert, mild malaise. Hydration good Vitals stable. frontal/ maxillary tenderness evident positive nasal congestion. pharynx normal neck supple  lungs clear/no crackles however positive mild expiratory wheezes. heart regular in rhythm       Assessment & Plan:  Impression rhinosinusitis/bronchitis with reactive airways.  Likely post viral, even potential element of flu discussed discussed with patient. plan antibiotics prescribed.  Albuterol prescribed.  Tessalon Perles for cough.  Questions answered. Symptomatic care discussed. warning signs discussed. WSL

## 2018-05-01 ENCOUNTER — Telehealth: Payer: Self-pay | Admitting: Family Medicine

## 2018-05-01 MED ORDER — FLUCONAZOLE 150 MG PO TABS: ORAL_TABLET | ORAL | 0 refills | Status: DC

## 2018-05-01 NOTE — Telephone Encounter (Signed)
Per protocol:  Diflucan 150 mg #2 one tablet 3 days apart sent electronically to pharmacy. Patient notified. 

## 2018-05-01 NOTE — Telephone Encounter (Signed)
Patient is requesting something for a yeast infection for at 5 days please.  She states that's how long it last ,[facility]

## 2018-05-08 ENCOUNTER — Emergency Department (HOSPITAL_COMMUNITY): Payer: Medicare Other

## 2018-05-08 ENCOUNTER — Emergency Department (HOSPITAL_COMMUNITY)
Admission: EM | Admit: 2018-05-08 | Discharge: 2018-05-08 | Disposition: A | Discharge status: Home / Self Care | Payer: Medicare Other | Attending: Emergency Medicine | Admitting: Emergency Medicine

## 2018-05-08 ENCOUNTER — Encounter (HOSPITAL_COMMUNITY): Payer: Self-pay | Admitting: Emergency Medicine

## 2018-05-08 ENCOUNTER — Other Ambulatory Visit: Payer: Self-pay

## 2018-05-08 DIAGNOSIS — R0789 Other chest pain: Secondary | ICD-10-CM

## 2018-05-08 DIAGNOSIS — R079 Chest pain, unspecified: Secondary | ICD-10-CM | POA: Diagnosis not present

## 2018-05-08 DIAGNOSIS — Z87891 Personal history of nicotine dependence: Secondary | ICD-10-CM | POA: Insufficient documentation

## 2018-05-08 DIAGNOSIS — Z79899 Other long term (current) drug therapy: Secondary | ICD-10-CM | POA: Insufficient documentation

## 2018-05-08 DIAGNOSIS — I10 Essential (primary) hypertension: Secondary | ICD-10-CM | POA: Insufficient documentation

## 2018-05-08 DIAGNOSIS — R000 Tachycardia, unspecified: Secondary | ICD-10-CM | POA: Diagnosis not present

## 2018-05-08 DIAGNOSIS — R05 Cough: Secondary | ICD-10-CM | POA: Diagnosis not present

## 2018-05-08 DIAGNOSIS — J111 Influenza due to unidentified influenza virus with other respiratory manifestations: Secondary | ICD-10-CM | POA: Diagnosis not present

## 2018-05-08 LAB — BASIC METABOLIC PANEL
Anion gap: 14 (ref 5–15)
BUN: 8 mg/dL (ref 6–20)
CO2: 26 mmol/L (ref 22–32)
Calcium: 9.6 mg/dL (ref 8.9–10.3)
Chloride: 98 mmol/L (ref 98–111)
Creatinine, Ser: 0.63 mg/dL (ref 0.44–1.00)
GFR calc Af Amer: >60 mL/min (ref >60)
GFR calc non Af Amer: >60 mL/min (ref >60)
Glucose, Bld: 109 mg/dL — ABNORMAL HIGH (ref 70–99)
Potassium: 3.3 mmol/L — ABNORMAL LOW (ref 3.5–5.1)
Sodium: 138 mmol/L (ref 135–145)

## 2018-05-08 LAB — D-DIMER, QUANTITATIVE: D-Dimer, Quant: 2.69 ug/mL-FEU — ABNORMAL HIGH (ref 0.00–0.50)

## 2018-05-08 LAB — CBC
HCT: 44.4 % (ref 36.0–46.0)
Hemoglobin: 14.1 g/dL (ref 12.0–15.0)
MCH: 29 pg (ref 26.0–34.0)
MCHC: 31.8 g/dL (ref 30.0–36.0)
MCV: 91.2 fL (ref 80.0–100.0)
Platelets: 320 10*3/uL (ref 150–400)
RBC: 4.87 MIL/uL (ref 3.87–5.11)
RDW: 14.3 % (ref 11.5–15.5)
WBC: 11.5 10*3/uL — ABNORMAL HIGH (ref 4.0–10.5)
nRBC: 0 % (ref 0.0–0.2)

## 2018-05-08 LAB — TROPONIN I: Troponin I: <0.03 ng/mL (ref <0.03)

## 2018-05-08 MED ORDER — IOPAMIDOL (ISOVUE-370) INJECTION 76%
100.0000 mL | Freq: Once | INTRAVENOUS | Status: AC | PRN
  Administered 2018-05-08: 100 mL via INTRAVENOUS

## 2018-05-08 MED ORDER — ALBUTEROL SULFATE (2.5 MG/3ML) 0.083% IN NEBU
2.5000 mg | INHALATION_SOLUTION | Freq: Once | RESPIRATORY_TRACT | Status: AC
  Administered 2018-05-08: 2.5 mg via RESPIRATORY_TRACT
  Filled 2018-05-08: qty 3

## 2018-05-08 MED ORDER — METHYLPREDNISOLONE SODIUM SUCC 125 MG IJ SOLR
125.0000 mg | Freq: Once | INTRAMUSCULAR | Status: AC
  Administered 2018-05-08: 125 mg via INTRAVENOUS
  Filled 2018-05-08: qty 2

## 2018-05-08 MED ORDER — LEVOFLOXACIN 500 MG PO TABS
500.0000 mg | ORAL_TABLET | Freq: Once | ORAL | Status: AC
  Administered 2018-05-08: 500 mg via ORAL
  Filled 2018-05-08: qty 1

## 2018-05-08 MED ORDER — BECLOMETHASONE DIPROP HFA 80 MCG/ACT IN AERB: 2.0000 | INHALATION_SPRAY | Freq: Two times a day (BID) | RESPIRATORY_TRACT | 0 refills | Status: DC

## 2018-05-08 MED ORDER — LEVOFLOXACIN 500 MG PO TABS: 500.0000 mg | ORAL_TABLET | Freq: Every day | ORAL | 0 refills | Status: DC

## 2018-05-08 MED ORDER — IPRATROPIUM-ALBUTEROL 0.5-2.5 (3) MG/3ML IN SOLN
3.0000 mL | Freq: Once | RESPIRATORY_TRACT | Status: AC
  Administered 2018-05-08: 3 mL via RESPIRATORY_TRACT
  Filled 2018-05-08: qty 3

## 2018-05-08 MED ORDER — PREDNISONE 20 MG PO TABS: ORAL_TABLET | ORAL | 0 refills | Status: DC

## 2018-05-08 NOTE — ED Triage Notes (Signed)
Patient complaining of cough for over a week, states she was diagnosed with the flu. Now complaining of chest "pressure, diarrhea, and left leg numbness since Monday.

## 2018-05-08 NOTE — Discharge Instructions (Addendum)
Follow-up with your doctor next week for recheck.  Return here this weekend if any problems

## 2018-05-08 NOTE — ED Provider Notes (Signed)
Stephanie [name] Provider Note   CSN: [id number] Arrival date & time: 05/08/18  1348     History   Chief Complaint Chief Complaint  Patient presents with  . Chest Pain    HPI Stephanie [name] is a 57 y.o. female.  Patient complains of shortness of breath and cough.  Patient has a history of COPD  The history is provided by the patient.  Cough  This is a new problem. The current episode started more than 2 days ago. The problem occurs constantly. The problem has not changed since onset.The cough is non-productive. There has been no fever. Pertinent negatives include no chest pain, no chills and no headaches. She has tried nothing for the symptoms. The treatment provided no relief. She is not a smoker. Her past medical history does not include pneumonia.    Past Medical History:  Diagnosis Date  . Allergic rhinitis   . Arthritis   . Asthma   . Depression   . Heart murmur   . Hypertension   . Pneumonia    hx of x 3     Patient Active Problem List   Diagnosis Date Noted  . OA (osteoarthritis) of hip 03/27/2017  . History of contact dermatitis 02/11/2017  . Chronic pain syndrome 01/09/2017  . 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 Moderate persistent asthma 04/27/2015  . Allergic rhinoconjunctivitis 04/27/2015  . Right-sided low back pain with right-sided sciatica 02/22/2015  . Morbid obesity (HCC) 09/27/2014  . Essential hypertension 10/17/2012    Past Surgical History:  Procedure Laterality Date  . CESAREAN SECTION  1985, 1989, 1995  . TONSILLECTOMY    . TOTAL HIP ARTHROPLASTY Right 03/27/2017   Procedure: RIGHT TOTAL HIP ARTHROPLASTY ANTERIOR APPROACH;  Surgeon: Ollen GrossAluisio, Frank, MD;  Location: [facility];  Service: Orthopedics;  Laterality: Right;     OB History   No obstetric history on file.      Home Medications    Prior to Admission medications   Medication Sig Start Date End Date Taking? Authorizing Provider  albuterol (PROVENTIL  HFA) 108 (90 Base) MCG/ACT inhaler Inhale 2 puffs into the lungs every 4 (four) hours as needed for wheezing or shortness of breath. 04/29/18  Yes Merlyn AlbertLuking, William S, MD  Ascorbic Acid (VITAMIN C) 100 MG tablet Take 100 mg by mouth daily.   Yes [provider]  benzonatate (TESSALON) 100 MG capsule Take 1 capsule (100 mg total) by mouth 3 (three) times daily as needed for cough. 04/29/18  Yes Merlyn AlbertLuking, William S, MD  Calcium Carbonate (CALCIUM 600 PO) Take 1 capsule by mouth daily.   Yes [provider]  citalopram (CELEXA) 20 MG tablet One half tablet qam for 7 days, then one tablet qam 04/29/18  Yes Merlyn AlbertLuking, William S, MD  EPINEPHrine (EPIPEN 2-PAK) 0.3 mg/0.3 mL IJ SOAJ injection Inject 0.3 mg into the muscle once as needed (allergic reaction).   Yes [provider]  fluticasone furoate-vilanterol (BREO ELLIPTA) 200-25 MCG/INH AEPB Inhale 1 puff into the lungs daily.   Yes [provider]  montelukast (SINGULAIR) 10 MG tablet Take 1 tablet (10 mg total) by mouth at bedtime. 11/19/17  Yes Kozlow, Alvira PhilipsEric J, MD  oxyCODONE-acetaminophen (ROXICET) 5-325 MG tablet One pill po tid prn Cautioned drowsiness. 11/11/17  Yes Luking, Jonna CoupScott A, MD  triamterene-hydrochlorothiazide (MAXZIDE-25) 37.5-25 MG tablet Take 1 tablet by mouth daily. 04/29/18  Yes Merlyn AlbertLuking, William S, MD  vitamin E 100 UNIT capsule Take 100 Units by mouth daily.  Yes [provider]  amoxicillin-clavulanate (AUGMENTIN) 875-125 MG tablet Take 1 tablet by mouth 2 (two) times daily. Patient not taking: Reported on 05/08/2018 04/29/18   Merlyn Albert, MD  beclomethasone (QVAR REDIHALER) 80 MCG/ACT inhaler Inhale 2 puffs into the lungs 2 (two) times daily. 05/08/18   Bethann Berkshire, MD  fluconazole (DIFLUCAN) 150 MG tablet One tablet 3 days apart Patient not taking: Reported on 05/08/2018 05/01/18   Merlyn Albert, MD  levofloxacin (LEVAQUIN) 500 MG tablet Take 1 tablet (500 mg total) by mouth daily.  05/08/18   Bethann Berkshire, MD  predniSONE (DELTASONE) 20 MG tablet 2 tabs po daily x 3 days 05/08/18   Bethann Berkshire, MD    Family History Family History  Problem Relation Age of Onset  . Hypertension Mother   . Hypertension Father     Social History Social History   Tobacco Use  . Smoking status: Former Smoker    Packs/day: 0.25    Years: 18.00    Pack years: 4.50    Types: Cigarettes    Start date: 09/24/1994    Last attempt to quit: 03/06/2013    Years since quitting: 5.1  . Smokeless tobacco: Never Used  Substance Use Topics  . Alcohol use: Yes    Alcohol/week: 0.0 standard drinks    Comment: wine and beer on occasion  . Drug use: No     Allergies   Aspirin; Hydrocodone; Latex; and Amoxicillin   Review of Systems Review of Systems  Constitutional: Negative for appetite change, chills and fatigue.  HENT: Negative for congestion, ear discharge and sinus pressure.   Eyes: Negative for discharge.  Respiratory: Positive for cough.   Cardiovascular: Negative for chest pain.  Gastrointestinal: Negative for abdominal pain and diarrhea.  Genitourinary: Negative for frequency and hematuria.  Musculoskeletal: Negative for back pain.  Skin: Negative for rash.  Neurological: Negative for seizures and headaches.  Psychiatric/Behavioral: Negative for hallucinations.     Physical Exam Updated Vital Signs BP (!) 145/99   Pulse (!) 106   Temp 98.3 F (36.8 C)   Resp (!) 21   Ht 5\' 4"  (1.626 m)   Wt 83.9 kg   SpO2 99%   BMI 31.76 kg/m   Physical Exam Vitals signs reviewed.  Constitutional:      Appearance: She is well-developed.  HENT:     Head: Normocephalic.     Nose: Nose normal.     Mouth/Throat:     Mouth: Mucous membranes are moist.  Eyes:     General: No scleral icterus.    Conjunctiva/sclera: Conjunctivae normal.  Neck:     Musculoskeletal: Neck supple.     Thyroid: No thyromegaly.  Cardiovascular:     Rate and Rhythm: Normal rate and regular  rhythm.     Heart sounds: No murmur. No friction rub. No gallop.   Pulmonary:     Breath sounds: No stridor. Wheezing present. No rales.  Chest:     Chest wall: No tenderness.  Abdominal:     General: There is no distension.     Tenderness: There is no abdominal tenderness. There is no rebound.  Musculoskeletal: Normal range of motion.  Lymphadenopathy:     Cervical: No cervical adenopathy.  Skin:    Findings: No erythema or rash.  Neurological:     Mental Status: She is oriented to person, place, and time.     Motor: No abnormal muscle tone.     Coordination: Coordination normal.  Psychiatric:  Behavior: Behavior normal.      ED Treatments / Results  Labs (all labs ordered are listed, but only abnormal results are displayed) Labs Reviewed  BASIC METABOLIC PANEL - Abnormal; Notable for the following components:      Result Value   Potassium 3.3 (*)    Glucose, Bld 109 (*)    All other components within normal limits  CBC - Abnormal; Notable for the following components:   WBC 11.5 (*)    All other components within normal limits  D-DIMER, QUANTITATIVE (NOT AT [facility]) - Abnormal; Notable for the following components:   D-Dimer, Quant 2.69 (*)    All other components within normal limits  TROPONIN I    EKG EKG Interpretation  Date/Time:  Thursday May 08 2018 14:10:48 EST Ventricular Rate:  107 PR Interval:  182 QRS Duration: 78 QT Interval:  318 QTC Calculation: 424 R Axis:   17 Text Interpretation:  Sinus tachycardia Biatrial enlargement Cannot rule out Anterior infarct , age undetermined Abnormal ECG Confirmed by Bethann Berkshire [phone]) on 05/08/2018 9:04:09 PM   Radiology Dg Chest 2 View  Result Date: 05/08/2018 CLINICAL DATA:  Chest pain EXAM: CHEST - 2 VIEW COMPARISON:  None. FINDINGS: There is no edema or consolidation. Heart size is normal. There is mild pulmonary venous hypertension. No adenopathy. No bone lesions. IMPRESSION: Evident pulmonary  venous hypertension. No appreciable cardiomegaly. No edema or consolidation. Electronically Signed   By: Bretta Bang III M.D.   On: 05/08/2018 14:39   Ct Angio Chest Pe W And/or Wo Contrast  Result Date: 05/08/2018 CLINICAL DATA:  Cough for greater than 1 week. Recent diagnosis of flu. EXAM: CT ANGIOGRAPHY CHEST WITH CONTRAST TECHNIQUE: Multidetector CT imaging of the chest was performed using the standard protocol during bolus administration of intravenous contrast. Multiplanar CT image reconstructions and MIPs were obtained to evaluate the vascular anatomy. CONTRAST:  ISOVUE-370 IOPAMIDOL (ISOVUE-370) INJECTION 76% COMPARISON:  Two-view chest x-ray 05/08/2018 FINDINGS: Cardiovascular: Heart size is normal. The aortic arch and great vessel origins are within normal limits. Pulmonary artery opacification is excellent. No focal filling defects are evident to suggest pulmonary embolus. Mediastinum/Nodes: No significant axillary adenopathy is present. There is mild prominence of lymphoid tissue at the hila bilaterally. No significant mediastinal adenopathy is present. Thoracic inlet is within normal limits. Esophagus is unremarkable. Lungs/Pleura: A diffuse peribronchial micronodular pattern is present throughout the right middle and lower lobes. Minimal micronodular airspace disease is present at the left lung base is well. Minimal peribronchial disease is present inferiorly in the left upper lobe. Upper Abdomen: No solid organ lesions are present. Musculoskeletal: Vertebral body heights alignment are maintained. No focal lytic or blastic lesions are present. Review of the MIP images confirms the above findings. IMPRESSION: 1. No pulmonary embolus. 2. Diffuse peribronchial micronodular airspace disease. This likely represents atypical infection. No significant airspace consolidation is present. 3. Mild hilar lymphoid prominence is likely reactive to the diffuse airspace disease. Electronically Signed    By: Marin Roberts M.D.   On: 05/08/2018 20:19    Procedures Procedures (including critical care time)  Medications Ordered in ED Medications  levofloxacin (LEVAQUIN) tablet 500 mg (has no administration in time range)  ipratropium-albuterol (DUONEB) 0.5-2.5 (3) MG/3ML nebulizer solution 3 mL (3 mLs Nebulization Given 05/08/18 1754)  albuterol (PROVENTIL) (2.5 MG/3ML) 0.083% nebulizer solution 2.5 mg (2.5 mg Nebulization Given 05/08/18 1753)  methylPREDNISolone sodium succinate (SOLU-MEDROL) 125 mg/2 mL injection 125 mg (125 mg Intravenous Given 05/08/18 1804)  iopamidol (ISOVUE-370) 76 % injection 100 mL (100 mLs Intravenous Contrast Given 05/08/18 1946)     Initial Impression / Assessment and Plan / ED Course  I have reviewed the triage vital signs and the nursing notes.  Pertinent labs & imaging results that were available during my care of the patient were reviewed by me and considered in my medical decision making (see chart for details).     Patient with COPD exacerbation and pneumonia.  Patient ambulated without problems after the treatment and her O2 sat stayed above 93%.  She will be sent home with Qvar inhaler, Levaquin, prednisone and follow-up with her PCP  Final Clinical Impressions(s) / ED Diagnoses   Final diagnoses:  Atypical chest pain    ED Discharge Orders         Ordered    levofloxacin (LEVAQUIN) 500 MG tablet  Daily     05/08/18 2157    predniSONE (DELTASONE) 20 MG tablet     05/08/18 2157    beclomethasone (QVAR REDIHALER) 80 MCG/ACT inhaler  2 times daily     05/08/18 2157           Bethann BerkshireZammit, Ashanti Ratti, MD 05/08/18 2159

## 2018-05-11 ENCOUNTER — Telehealth: Payer: Self-pay | Admitting: Family Medicine

## 2018-05-11 NOTE — Telephone Encounter (Signed)
Apparently the patient's medication is not on her formulary  Please see letter from [employer] our friendly insurance company  May send them another prescription for this and asked them to fill what ever form of albuterol is on this patient's lovely insurance company

## 2018-05-12 NOTE — Telephone Encounter (Signed)
I called [facility] and spoke with Retia PasseFrank Burton,whom states he will fill either pro air or ventolin which ever the insurance will approve.

## 2018-05-21 ENCOUNTER — Telehealth: Payer: Self-pay | Admitting: *Deleted

## 2018-05-21 ENCOUNTER — Ambulatory Visit (INDEPENDENT_AMBULATORY_CARE_PROVIDER_SITE_OTHER): Payer: PPO | Admitting: Family Medicine

## 2018-05-21 ENCOUNTER — Encounter: Payer: Self-pay | Admitting: Family Medicine

## 2018-05-21 ENCOUNTER — Telehealth: Payer: Self-pay

## 2018-05-21 VITALS — BP 118/84 | Temp 98.4°F | Ht 64.0 in | Wt 183.1 lb

## 2018-05-21 DIAGNOSIS — G894 Chronic pain syndrome: Secondary | ICD-10-CM | POA: Diagnosis not present

## 2018-05-21 DIAGNOSIS — E876 Hypokalemia: Secondary | ICD-10-CM | POA: Diagnosis not present

## 2018-05-21 DIAGNOSIS — I83812 Varicose veins of left lower extremities with pain: Secondary | ICD-10-CM | POA: Diagnosis not present

## 2018-05-21 DIAGNOSIS — I10 Essential (primary) hypertension: Secondary | ICD-10-CM | POA: Diagnosis not present

## 2018-05-21 DIAGNOSIS — E785 Hyperlipidemia, unspecified: Secondary | ICD-10-CM | POA: Diagnosis not present

## 2018-05-21 DIAGNOSIS — M25552 Pain in left hip: Secondary | ICD-10-CM | POA: Diagnosis not present

## 2018-05-21 MED ORDER — OXYCODONE-ACETAMINOPHEN 5-325 MG PO TABS: ORAL_TABLET | ORAL | 0 refills | Status: DC

## 2018-05-21 MED ORDER — DOXYCYCLINE HYCLATE 100 MG PO TABS: 100.0000 mg | ORAL_TABLET | Freq: Two times a day (BID) | ORAL | 0 refills | Status: DC

## 2018-05-21 MED ORDER — TRIAMTERENE-HCTZ 37.5-25 MG PO TABS: 1.0000 | ORAL_TABLET | Freq: Every day | ORAL | 6 refills | Status: DC

## 2018-05-21 MED ORDER — ALBUTEROL SULFATE HFA 108 (90 BASE) MCG/ACT IN AERS: 2.0000 | INHALATION_SPRAY | RESPIRATORY_TRACT | 0 refills | Status: DC | PRN

## 2018-05-21 MED ORDER — CITALOPRAM HYDROBROMIDE 20 MG PO TABS: ORAL_TABLET | ORAL | 6 refills | Status: DC

## 2018-05-21 NOTE — Progress Notes (Signed)
Subjective:    Patient ID: Stephanie [name], female    DOB: 10/10/1960, 58 y.o.   MRN: [id number]  HPI  Patient is here today to follow up on her chronic health issues.   She has a history of Hypertension and takes Triamtere- Hctz 37.5-25 mg  Patient for blood pressure check up.  The patient does have hypertension.  The patient is on medication.  Patient relates compliance with meds. Todays BP reviewed with the patient. Patient denies issues with medication. Patient relates reasonable diet. Patient tries to minimize salt. Patient aware of BP goals.  She states she is currently on Celexa 20 mg once per day she states for depression.  is patient is here today regarding follow-up regarding depression.  Patient relates compliance with medication.  Patient understands importance of taking the medication.  Patient denies any significant side effects.  Patient relates that the medication is still beneficial for them and they would like to continue the medication.  Patient is not having any threats of homicide or suicide.  She also reports she is also following up on her pneumonia that she was diagnosed with at the Ed on December 26,2019. She states she feels some better,but still weak. Patient is gradually getting better finishing up the antibiotics overall energy level is improving  This patient was seen today for chronic pain  The medication list was reviewed and updated.   -Compliance with medication:  Oxycodone 5-325   - Number patient states they take daily: 0-2  -when was the last dose patient took? She last took it around a month ago and states it cost $180.00 per month. She states she went from paying .90 cent per month to 180.00. She has been "eating" Tylenol to help with the pain.  The patient was advised the importance of maintaining medication and not using illegal substances with these.  Here for refills and follow up  The patient was educated that we can provide 3 monthly  scripts for their medication, it is their responsibility to follow the instructions.  Side effects or complications from medications: constipation  Patient is aware that pain medications are meant to minimize the severity of the pain to allow their pain levels to improve to allow for better function. They are aware of that pain medications cannot totally remove their pain.  Due for UDT ( at least once per year) : 11/12/2018      Review of Systems  Constitutional: Negative for activity change, appetite change and fatigue.  HENT: Negative for congestion and rhinorrhea.   Respiratory: Negative for cough and shortness of breath.   Cardiovascular: Negative for chest pain and leg swelling.  Gastrointestinal: Negative for abdominal pain and diarrhea.  Endocrine: Negative for polydipsia and polyphagia.  Skin: Negative for color change.  Neurological: Negative for dizziness and weakness.  Psychiatric/Behavioral: Negative for behavioral problems and confusion.       Objective:   Physical Exam Vitals signs reviewed.  Constitutional:      General: She is not in acute distress. HENT:     Head: Normocephalic and atraumatic.  Eyes:     General:        Right eye: No discharge.        Left eye: No discharge.  Neck:     Trachea: No tracheal deviation.  Cardiovascular:     Rate and Rhythm: Normal rate and regular rhythm.     Heart sounds: Normal heart sounds. No murmur.  Pulmonary:     Effort:  Pulmonary effort is normal. No respiratory distress.     Breath sounds: Normal breath sounds.  Lymphadenopathy:     Cervical: No cervical adenopathy.  Skin:    General: Skin is warm and dry.  Neurological:     Mental Status: She is alert.     Coordination: Coordination normal.  Psychiatric:        Behavior: Behavior normal.           Assessment & Plan:  The patient was seen in followup for chronic pain. A review over at their current pain status was discussed. Drug registry was  checked. Prescriptions were given. Discussion was held regarding the importance of compliance with medication as well as pain medication contract.  Time for questions regarding pain management plan occurred. Importance of regular followup visits was discussed. Patient was informed that medication may cause drowsiness and should not be combined  with other medications/alcohol or street drugs. Patient was cautioned that medication could cause drowsiness. If the patient feels medication is causing altered alertness then do not drive or operate dangerous equipment.  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 The importance of healthy diet and regular physical activity was discussed. The importance of compliance with medications discussed.  Ideal goal is to keep blood pressure low elevated levels certainly below 140/90 when possible.  The patient was counseled that keeping blood pressure under control lessen his risk of complications.  The importance of regular follow-ups was discussed with the patient.  Low-salt diet such as DASH recommended.  Regular physical activity was recommended as well.  Patient was advised to keep regular follow-ups.  25 minutes was spent with the patient.  This statement verifies that 25 minutes was indeed spent with the patient.  More than 50% of this visit-total duration of the visit-was spent in counseling and coordination of care. The issues that the patient came in for today as reflected in the  diagnosis (s) please refer to documentation for further details.

## 2018-05-21 NOTE — Telephone Encounter (Signed)
Patient left her appointment without her lab orders. I called and left a message to call back to remind her to have her labs drawn.

## 2018-05-22 ENCOUNTER — Encounter: Payer: Self-pay | Admitting: Family Medicine

## 2018-05-22 ENCOUNTER — Other Ambulatory Visit: Payer: Self-pay | Admitting: Family Medicine

## 2018-05-22 ENCOUNTER — Telehealth: Payer: Self-pay | Admitting: Family Medicine

## 2018-05-22 LAB — LIPID PANEL
Chol/HDL Ratio: 2.5 ratio (ref 0.0–4.4)
Cholesterol, Total: 174 mg/dL (ref 100–199)
HDL: 71 mg/dL (ref >39)
LDL Calculated: 83 mg/dL (ref 0–99)
Triglycerides: 102 mg/dL (ref 0–149)
VLDL Cholesterol Cal: 20 mg/dL (ref 5–40)

## 2018-05-22 LAB — POTASSIUM: POTASSIUM: 4.4 mmol/L (ref 3.5–5.2)

## 2018-05-22 NOTE — Telephone Encounter (Signed)
Pt calling requesting her oxyCODONE-acetaminophen (PERCOCET/ROXICET) 5-325 MG tablet be transferred to [facility] - [place], [place] - [address]

## 2018-05-22 NOTE — Telephone Encounter (Signed)
Patient stated she had the blood work drawn when she left our office.

## 2018-05-22 NOTE — Telephone Encounter (Signed)
Please clarify with patient Does she want all 3 prescriptions of oxycodone transferred to [facility]? When we make this transfer she will need to stick with them for the next 3 months Please clarify with patient make sure she is on board before I go through the process of doing this

## 2018-05-23 ENCOUNTER — Other Ambulatory Visit: Payer: Self-pay | Admitting: Family Medicine

## 2018-05-23 MED ORDER — OXYCODONE-ACETAMINOPHEN 5-325 MG PO TABS: ORAL_TABLET | ORAL | 0 refills | Status: DC

## 2018-05-23 NOTE — Telephone Encounter (Signed)
Patient stated she is having trouble with her insurance and would just like one script sent in at a time for now till she gets it worked out . Patient would like this script sent to [facility].

## 2018-05-23 NOTE — Telephone Encounter (Signed)
This prescription was sent into the pharmacy as requested-[facility]

## 2018-05-27 ENCOUNTER — Encounter: Payer: Self-pay | Admitting: Allergy and Immunology

## 2018-05-27 ENCOUNTER — Ambulatory Visit (INDEPENDENT_AMBULATORY_CARE_PROVIDER_SITE_OTHER): Payer: PPO | Admitting: Allergy and Immunology

## 2018-05-27 VITALS — HR 102 | Temp 98.0°F | Resp 16 | Ht 64.0 in | Wt 183.0 lb

## 2018-05-27 DIAGNOSIS — Z886 Allergy status to analgesic agent status: Secondary | ICD-10-CM | POA: Diagnosis not present

## 2018-05-27 DIAGNOSIS — J4540 Moderate persistent asthma, uncomplicated: Secondary | ICD-10-CM

## 2018-05-27 DIAGNOSIS — K219 Gastro-esophageal reflux disease without esophagitis: Secondary | ICD-10-CM

## 2018-05-27 DIAGNOSIS — J3089 Other allergic rhinitis: Secondary | ICD-10-CM

## 2018-05-27 NOTE — Patient Instructions (Addendum)
  1. Treat inflammation:   A. Symbicort 180 2 inhalations two times per day (samples)  B. montelukast 10 mg one tablet one time per day   C. OTC Nasacort   one spray each nostril 3-7 times per week   D. Prednisone 10mg  - 1 tablet one time per day for 10 days  2. Treat reflux:   A. Omeprazole 40mg  1 tablet two times per day  3. Continue ProAir HFA or similar 2 puffs every 4-6 hours if needed  4. Continue OTC antihistamine and Epi-Pen if needed  5. Dry out house with dehumidifier  6. Return to clinic in 2 weeks or earlier if problem

## 2018-05-27 NOTE — Progress Notes (Signed)
Follow-up Note  Referring Provider: Babs SciaraLuking, Scott A, MD Primary Provider: Babs SciaraLuking, Scott A, MD Date of Office Visit: 05/27/2018  Subjective:   Stephanie [name] (DOB: Mar 06, 1961) is a 58 y.o. female [facility] in re-evaluation of the following:  HPI: Stephanie [name] presents to this clinic in evaluation of several different issues.  I last saw her in this clinic on 19 November 2017 in evaluation of asthma and allergic rhinitis and nonsteroidal anti-inflammatory drug induced reactions.  She apparently developed influenza around Christmas time and it sounds as though she was not treated with Tamiflu but some type of penicillin based antibiotic.  After many days of using an antibiotic she developed skin problems and lung problems apparently as a side effect of this medication.  On 13 May 2018 she was diagnosed with a pneumonia apparently at the emergency room with a chest x-ray which identified an abnormality and she was given an antibiotic and a steroid.  She still continues to have significant problems and this past Friday she was given another course of antibiotics for 2 weeks with the antibiotic being doxycycline.  Currently she has cough and shortness of breath especially with exertion and still has a lot of phlegm production.  At this point her nose is doing relatively well.  As well, she has rather significant regurgitation that has developed and she does have some postnasal drip and some throat clearing.  In addition, she states that she has been having other problems for the past 3 or 4 months.  She is very tired.  She wakes up at nighttime with chest pressure in her sternal region and shortness of breath and some cough for the past 3 months.  For some reason she discontinued all of her medications thinking that she might be having a side effect from 1 of her medications.  Apparently she was continuing on her antihypertensive medicine.  She has  investigated her house for mold and apparently there is some mold growth in the house.  Allergies as of 05/27/2018      Reactions   Amoxicillin Rash   fash swelled up and itching 04/2018   Aspirin Anaphylaxis   Hydrocodone Itching      Latex Hives, Itching   Levaquin [levofloxacin]    Rash on chest no hives 04/2018      Medication List      albuterol 108 (90 Base) MCG/ACT inhaler Commonly known as:  PROVENTIL HFA Inhale 2 puffs into the lungs every 4 (four) hours as needed for wheezing or shortness of breath.   BREO ELLIPTA 200-25 MCG/INH Aepb Generic drug:  fluticasone furoate-vilanterol Inhale 1 puff into the lungs daily.   CALCIUM 600 PO Take 1 capsule by mouth daily.   citalopram 20 MG tablet Commonly known as:  CELEXA One half tablet qam for 7 days, then one tablet qam   doxycycline 100 MG tablet Commonly known as:  VIBRA-TABS Take 1 tablet (100 mg total) by mouth 2 (two) times daily.   EPIPEN 2-PAK 0.3 mg/0.3 mL Soaj injection Generic drug:  EPINEPHrine Inject 0.3 mg into the muscle once as needed (allergic reaction).   montelukast 10 MG tablet Commonly known as:  SINGULAIR Take 1 tablet (10 mg total) by mouth at bedtime.   oxyCODONE-acetaminophen 5-325 MG tablet Commonly known as:  PERCOCET/ROXICET One pill po tid prn to last 30 days.Cautioned drowsiness.   oxyCODONE-acetaminophen 5-325 MG tablet Commonly known as:  PERCOCET/ROXICET One pill po  tid prn to last 30 days.Cautioned drowsiness.   oxyCODONE-acetaminophen 5-325 MG tablet Commonly known as:  ROXICET One pill po tid prn to last 30 days.Cautioned drowsiness.   triamterene-hydrochlorothiazide 37.5-25 MG tablet Commonly known as:  MAXZIDE-25 Take 1 tablet by mouth daily.   vitamin C 100 MG tablet Take 100 mg by mouth daily.   vitamin E 100 UNIT capsule Take 100 Units by mouth daily.       Past Medical History:  Diagnosis Date  . Allergic rhinitis   . Arthritis   . Asthma   .  Depression   . Heart murmur   . Hypertension   . Pneumonia    hx of x 3     Past Surgical History:  Procedure Laterality Date  . CESAREAN SECTION  1985, 1989, 1995  . TONSILLECTOMY    . TOTAL HIP ARTHROPLASTY Right 03/27/2017   Procedure: RIGHT TOTAL HIP ARTHROPLASTY ANTERIOR APPROACH;  Surgeon: Ollen Gross, MD;  Location: [facility];  Service: Orthopedics;  Laterality: Right;    Review of systems negative except as noted in HPI / PMHx or noted below:  Review of Systems  Constitutional: Negative.   HENT: Negative.   Eyes: Negative.   Respiratory: Negative.   Cardiovascular: Negative.   Gastrointestinal: Negative.   Genitourinary: Negative.   Musculoskeletal: Negative.   Skin: Negative.   Neurological: Negative.   Endo/Heme/Allergies: Negative.   Psychiatric/Behavioral: Negative.      Objective:   Vitals:   05/27/18 1617  Pulse: (!) 102  Resp: 16  Temp: 98 F (36.7 C)  SpO2: 96%   Height: 5\' 4"  (162.6 cm)  Weight: 183 lb (83 kg)   Physical Exam Constitutional:      Appearance: She is not diaphoretic.  HENT:     Head: Normocephalic.     Right Ear: Tympanic membrane, ear canal and external ear normal.     Left Ear: Tympanic membrane, ear canal and external ear normal.     Nose: Nose normal. No mucosal edema or rhinorrhea.     Mouth/Throat:     Pharynx: Uvula midline. No oropharyngeal exudate.  Eyes:     Conjunctiva/sclera: Conjunctivae normal.  Neck:     Thyroid: No thyromegaly.     Trachea: Trachea normal. No tracheal tenderness or tracheal deviation.  Cardiovascular:     Rate and Rhythm: Normal rate and regular rhythm.     Heart sounds: Normal heart sounds, S1 normal and S2 normal. No murmur.  Pulmonary:     Effort: No respiratory distress.     Breath sounds: Normal breath sounds. No stridor. No wheezing (Bilateral expiratory wheezes posterior lung field) or rales.  Lymphadenopathy:     Head:     Right side of head: No tonsillar adenopathy.     Left  side of head: No tonsillar adenopathy.     Cervical: No cervical adenopathy.  Skin:    Findings: No erythema or rash.     Nails: There is no clubbing.   Neurological:     Mental Status: She is alert.     Diagnostics:    Spirometry was performed and demonstrated an FEV1 of 1.65 at 75 % of predicted.  Results of a chest x-ray obtained 08 May 2018 identified the following:  There is no edema or consolidation. Heart size is normal. There is mild pulmonary venous hypertension. No adenopathy. No bone lesions.  Results of a chest CT scan angio obtained on 08 May 2018 identified the following:  Cardiovascular: Heart  size is normal. The aortic arch and great vessel origins are within normal limits.  Pulmonary artery opacification is excellent. No focal filling defects are evident to suggest pulmonary embolus.  Mediastinum/Nodes: No significant axillary adenopathy is present. There is mild prominence of lymphoid tissue at the hila bilaterally. No significant mediastinal adenopathy is present. Thoracic inlet is within normal limits. Esophagus is unremarkable.  Lungs/Pleura: A diffuse peribronchial micronodular pattern is present throughout the right middle and lower lobes. Minimal micronodular airspace disease is present at the left lung base is well. Minimal peribronchial disease is present inferiorly in the left upper lobe.  Assessment and Plan:   1. Not well controlled moderate persistent asthma   2. Other allergic rhinitis   3. Allergy to NSAIDs   4. Gastroesophageal reflux disease, esophagitis presence not specified     1. Treat inflammation:   A. Symbicort 180 2 inhalations two times per day (samples)  B. montelukast 10 mg one tablet one time per day   C. OTC Nasacort   one spray each nostril 3-7 times per week   D. Prednisone 10mg  - 1 tablet one time per day for 10 days  2. Treat reflux:   A. Omeprazole 40mg  1 tablet two times per day  3. Continue  ProAir HFA or similar 2 puffs every 4-6 hours if needed  4. Continue OTC antihistamine and Epi-Pen if needed  5. Dry out house with dehumidifier  6. Return to clinic in 2 weeks or earlier if problem  I am going to have Stephanie [name] treat inflammation of her airway with the therapy noted above and see her back in this clinic in 2 weeks assuming that she does well during the interval.  We will also treat reflux as she does have some sternal chest pressure over the course of the past 3 months and she does have issues with regurgitation.  She does apparently have a mold problem in her house and she needs to dry out her house with a dehumidifier.  I will see her back in this clinic in 2 weeks or earlier if there is a problem.  Laurette SchimkeEric Daylee Delahoz, MD Allergy / Immunology [facility]

## 2018-05-28 ENCOUNTER — Telehealth: Payer: Self-pay | Admitting: *Deleted

## 2018-05-28 ENCOUNTER — Encounter: Payer: Self-pay | Admitting: Allergy and Immunology

## 2018-05-28 ENCOUNTER — Encounter: Payer: Self-pay | Admitting: Family Medicine

## 2018-05-28 ENCOUNTER — Telehealth: Payer: Self-pay | Admitting: Family Medicine

## 2018-05-28 MED ORDER — PREDNISONE 10 MG PO TABS: 10.0000 mg | ORAL_TABLET | Freq: Every day | ORAL | 0 refills | Status: DC

## 2018-05-28 NOTE — Telephone Encounter (Signed)
Pt would like prescriptions of oxyCODONE-acetaminophen (PERCOCET/ROXICET) 5-325 MG tablet sent to [facility] - [place], [place] - [address] instead of being at [facility] - [place], [place], [place] - [address]

## 2018-05-28 NOTE — Telephone Encounter (Signed)
Please advise. Thank you

## 2018-05-28 NOTE — Telephone Encounter (Signed)
Patient called requesting her prescriptions to be faxed to [facility] she thinks they went to [facility] but she needs them to be sent to [facility]

## 2018-05-28 NOTE — Telephone Encounter (Signed)
Spoke with patient.  She stated that she just needed Prednisone sent in.  Have sent that over to [facility].

## 2018-05-28 NOTE — Telephone Encounter (Signed)
LM for patient to return call.  Needed to know what medications she wants sent in.

## 2018-05-30 ENCOUNTER — Other Ambulatory Visit: Payer: Self-pay | Admitting: Family Medicine

## 2018-05-30 MED ORDER — OXYCODONE-ACETAMINOPHEN 5-325 MG PO TABS: ORAL_TABLET | ORAL | 0 refills | Status: DC

## 2018-05-30 NOTE — Telephone Encounter (Signed)
Nurses Just 7 days ago I sent a pain prescription to [facility] because the patient wanted it there Now I am being told that she wants it somewhere else Call the patient verify what she needs done and let her know in the future we will need to stick with 1 drugstore we cannot send controlled medicines to multiple drug stores  Please clarify and give me to her final answer regarding where she wants this prescription sent into

## 2018-05-30 NOTE — Telephone Encounter (Signed)
I did send all 3 pain scripts to [facility] The patient can call there to request the for prescription to be filled We will see her back in 3 months as planned sooner problems Call us if any issues

## 2018-05-30 NOTE — Telephone Encounter (Signed)
Pt returned call. Pt states that she has called [facility] three times and each time they say they have nothing for her. Pt states she is tired of bickering back and forth and would like for all meds to go to [facility]. Pt understood that controlled substances are being watch closely and that only one pharmacy is recommended to stick with. Pt states she would like all meds to go to [facility].

## 2018-05-30 NOTE — Telephone Encounter (Signed)
Left message to return call 

## 2018-05-30 NOTE — Telephone Encounter (Signed)
Pt returned call and informed that scripts have been sent to [facility]. Pt states she has already picked them up. Pt wanted to let provider know that she thanks him and is sorry for the confusion. Pt informed that we would see her in 3 months as planned or sooner if need be. Pt verbalized understanding.

## 2018-06-04 ENCOUNTER — Telehealth: Payer: Self-pay | Admitting: *Deleted

## 2018-06-04 NOTE — Telephone Encounter (Signed)
Insurance approved Oxycodone 5/325mg  - approval good 06/04/18-06/17/2018. Pharmacy notified.

## 2018-06-13 DIAGNOSIS — Z96641 Presence of right artificial hip joint: Secondary | ICD-10-CM | POA: Diagnosis not present

## 2018-06-13 DIAGNOSIS — Z471 Aftercare following joint replacement surgery: Secondary | ICD-10-CM | POA: Diagnosis not present

## 2018-06-13 DIAGNOSIS — M1612 Unilateral primary osteoarthritis, left hip: Secondary | ICD-10-CM | POA: Diagnosis not present

## 2018-06-13 DIAGNOSIS — M1611 Unilateral primary osteoarthritis, right hip: Secondary | ICD-10-CM | POA: Diagnosis not present

## 2018-06-17 ENCOUNTER — Encounter: Payer: Self-pay | Admitting: Allergy and Immunology

## 2018-06-17 ENCOUNTER — Ambulatory Visit (INDEPENDENT_AMBULATORY_CARE_PROVIDER_SITE_OTHER): Payer: Medicare HMO | Admitting: Allergy and Immunology

## 2018-06-17 VITALS — BP 132/82 | HR 78 | Temp 98.4°F | Resp 20

## 2018-06-17 DIAGNOSIS — Z886 Allergy status to analgesic agent status: Secondary | ICD-10-CM | POA: Diagnosis not present

## 2018-06-17 DIAGNOSIS — J4540 Moderate persistent asthma, uncomplicated: Secondary | ICD-10-CM | POA: Diagnosis not present

## 2018-06-17 DIAGNOSIS — J3089 Other allergic rhinitis: Secondary | ICD-10-CM

## 2018-06-17 DIAGNOSIS — K219 Gastro-esophageal reflux disease without esophagitis: Secondary | ICD-10-CM | POA: Diagnosis not present

## 2018-06-17 MED ORDER — FAMOTIDINE 40 MG PO TABS: 40.0000 mg | ORAL_TABLET | Freq: Every day | ORAL | 2 refills | Status: DC

## 2018-06-17 MED ORDER — TIOTROPIUM BROMIDE MONOHYDRATE 1.25 MCG/ACT IN AERS: 2.0000 | INHALATION_SPRAY | Freq: Every day | RESPIRATORY_TRACT | 1 refills | Status: DC

## 2018-06-17 NOTE — Progress Notes (Signed)
Follow-up Note  Referring Provider: Babs SciaraLuking, Scott A, MD Primary Provider: Babs SciaraLuking, Scott A, MD Date of Office Visit: 06/17/2018  Subjective:   Stephanie [name] (DOB: 08-Jul-1960) is a 58 y.o. female who [facility]/2018 in re-evaluation of the following:  HPI: Stephanie [name] returns to this clinic in reevaluation of her severe asthma, allergic rhinitis, reflux, and nonsteroidal anti-inflammatory drug allergy.  She was last seen in this clinic on 27 May 2018 at which point in time she had evidence of significant inflammation of her airway as a result of a previous influenza infection and very active reflux.  She is doing better in some ways.  Her breathing is better overall and she has much less coughing.  She thinks that her reflux is somewhat better.  However, she still wheezes and still must use a bronchodilator daily and still has some shortness of breath and still has throat clearing and intermittent hoarseness.  She is now living at her daughter's house while her house is being dehumidified.  She has had all the air conditioning ducts changed out because of the mold contamination.  Allergies as of 06/17/2018      Reactions   Amoxicillin Rash   fash swelled up and itching 04/2018   Aspirin Anaphylaxis   Hydrocodone Itching      Latex Hives, Itching   Levaquin [levofloxacin]    Rash on chest no hives 04/2018      Medication List      albuterol 108 (90 Base) MCG/ACT inhaler Commonly known as:  PROVENTIL HFA Inhale 2 puffs into the lungs every 4 (four) hours as needed for wheezing or shortness of breath.   CALCIUM 600 PO Take 1 capsule by mouth daily.   citalopram 20 MG tablet Commonly known as:  CELEXA One half tablet qam for 7 days, then one tablet qam   doxycycline 100 MG tablet Commonly known as:  VIBRA-TABS Take 1 tablet (100 mg total) by mouth 2 (two) times daily.   EPIPEN 2-PAK 0.3 mg/0.3 mL Soaj injection Generic drug:   EPINEPHrine Inject 0.3 mg into the muscle once as needed (allergic reaction).   montelukast 10 MG tablet Commonly known as:  SINGULAIR Take 1 tablet (10 mg total) by mouth at bedtime.   oxyCODONE-acetaminophen 5-325 MG tablet Commonly known as:  PERCOCET/ROXICET One pill po tid prn to last 30 days.Cautioned drowsiness.   oxyCODONE-acetaminophen 5-325 MG tablet Commonly known as:  PERCOCET/ROXICET One pill po tid prn to last 30 days.Cautioned drowsiness.   oxyCODONE-acetaminophen 5-325 MG tablet Commonly known as:  ROXICET One pill po tid prn to last 30 days.Cautioned drowsiness.   predniSONE 10 MG tablet Commonly known as:  DELTASONE Take 1 tablet (10 mg total) by mouth daily with breakfast. Take one tablet daily for ten days.   triamterene-hydrochlorothiazide 37.5-25 MG tablet Commonly known as:  MAXZIDE-25 Take 1 tablet by mouth daily.   vitamin C 100 MG tablet Take 100 mg by mouth daily.   vitamin E 100 UNIT capsule Take 100 Units by mouth daily.       Past Medical History:  Diagnosis Date  . Allergic rhinitis   . Arthritis   . Asthma   . Depression   . Heart murmur   . Hypertension   . Pneumonia    hx of x 3     Past Surgical History:  Procedure Laterality Date  . CESAREAN SECTION  1985, 1989, 1995  . TONSILLECTOMY    .  TOTAL HIP ARTHROPLASTY Right 03/27/2017   Procedure: RIGHT TOTAL HIP ARTHROPLASTY ANTERIOR APPROACH;  Surgeon: Ollen Gross, MD;  Location: [facility];  Service: Orthopedics;  Laterality: Right;    Review of systems negative except as noted in HPI / PMHx or noted below:  Review of Systems  Constitutional: Negative.   HENT: Negative.   Eyes: Negative.   Respiratory: Negative.   Cardiovascular: Negative.   Gastrointestinal: Negative.   Genitourinary: Negative.   Musculoskeletal: Negative.   Skin: Negative.   Neurological: Negative.   Endo/Heme/Allergies: Negative.   Psychiatric/Behavioral: Negative.      Objective:   Vitals:    06/17/18 1629  BP: 132/82  Pulse: 78  Resp: 20  Temp: 98.4 F (36.9 C)  SpO2: 96%          Physical Exam Constitutional:      Appearance: She is not diaphoretic.  HENT:     Head: Normocephalic.     Right Ear: Tympanic membrane, ear canal and external ear normal.     Left Ear: Tympanic membrane, ear canal and external ear normal.     Nose: Nose normal. No mucosal edema or rhinorrhea.     Mouth/Throat:     Pharynx: Uvula midline. No oropharyngeal exudate.  Eyes:     Conjunctiva/sclera: Conjunctivae normal.  Neck:     Thyroid: No thyromegaly.     Trachea: Trachea normal. No tracheal tenderness or tracheal deviation.  Cardiovascular:     Rate and Rhythm: Normal rate and regular rhythm.     Heart sounds: Normal heart sounds, S1 normal and S2 normal. No murmur.  Pulmonary:     Effort: No respiratory distress.     Breath sounds: Normal breath sounds. No stridor. No wheezing or rales.  Lymphadenopathy:     Head:     Right side of head: No tonsillar adenopathy.     Left side of head: No tonsillar adenopathy.     Cervical: No cervical adenopathy.  Skin:    Findings: No erythema or rash.     Nails: There is no clubbing.   Neurological:     Mental Status: She is alert.     Diagnostics:    Spirometry was performed and demonstrated an FEV1 of 1.48 at 67 % of predicted.  The patient had an Asthma Control Test with the following results: ACT Total Score: 9.    Assessment and Plan:   1. Not well controlled moderate persistent asthma   2. Other allergic rhinitis   3. Gastroesophageal reflux disease, esophagitis presence not specified   4. Allergy to NSAIDs     1. Treat inflammation:   A. Symbicort 180 2 inhalations two times per day    B. Spiriva 1.25 Respimat - 2 inhalations one time per day  C. montelukast 10 mg one tablet one time per day   D. OTC Nasacort   one spray each nostril 3-7 times per week   2. Treat reflux:   A.  Omeprazole 40mg  1 tablet two times  per day  B.  Famotidine 40 mg 1 tablet in evening  3. Continue ProAir HFA or similar 2 puffs every 4-6 hours if needed  4. Continue OTC antihistamine and Epi-Pen if needed  5. Return to clinic in 4 weeks or earlier if problem  Stephanie [name] is better in some ways.  She still has evidence of significant damage that was a result of her rather intense influenza infection back in December.  I am going to increase her therapy  for her lungs by having her use an anticholinergic agent while she continues on other anti-inflammatory agents for her airway and we will have her use a little bit more therapy for reflux as noted above.  I will regroup with her in 4 weeks and make a determination about further evaluation and treatment based upon her response to that approach.  Hopefully at some point in the near future we will be able to taper down her medications.  Laurette Schimke, MD Allergy / Immunology [facility]

## 2018-06-17 NOTE — Patient Instructions (Signed)
  1. Treat inflammation:   A. Symbicort 180 2 inhalations two times per day    B. Spiriva 1.25 Respimat - 2 inhalations one time per day  C. montelukast 10 mg one tablet one time per day   D. OTC Nasacort   one spray each nostril 3-7 times per week   2. Treat reflux:   A.  Omeprazole 40mg  1 tablet two times per day  B.  Famotidine 40 mg 1 tablet in evening  3. Continue ProAir HFA or similar 2 puffs every 4-6 hours if needed  4. Continue OTC antihistamine and Epi-Pen if needed  5. Return to clinic in 4 weeks or earlier if problem

## 2018-06-18 ENCOUNTER — Encounter: Payer: Self-pay | Admitting: Allergy and Immunology

## 2018-07-02 ENCOUNTER — Ambulatory Visit (INDEPENDENT_AMBULATORY_CARE_PROVIDER_SITE_OTHER): Payer: Medicare HMO | Admitting: Family Medicine

## 2018-07-02 ENCOUNTER — Encounter: Payer: Self-pay | Admitting: Family Medicine

## 2018-07-02 VITALS — BP 142/86 | Temp 98.5°F | Wt 185.6 lb

## 2018-07-02 DIAGNOSIS — Z77120 Contact with and (suspected) exposure to mold (toxic): Secondary | ICD-10-CM

## 2018-07-02 DIAGNOSIS — G894 Chronic pain syndrome: Secondary | ICD-10-CM

## 2018-07-02 DIAGNOSIS — M79672 Pain in left foot: Secondary | ICD-10-CM

## 2018-07-02 DIAGNOSIS — M160 Bilateral primary osteoarthritis of hip: Secondary | ICD-10-CM

## 2018-07-02 DIAGNOSIS — I10 Essential (primary) hypertension: Secondary | ICD-10-CM | POA: Diagnosis not present

## 2018-07-02 DIAGNOSIS — Z1211 Encounter for screening for malignant neoplasm of colon: Secondary | ICD-10-CM

## 2018-07-02 DIAGNOSIS — Z23 Encounter for immunization: Secondary | ICD-10-CM

## 2018-07-02 NOTE — Progress Notes (Signed)
Subjective:    Patient ID: Stephanie [name], female    DOB: 07-14-1960, 58 y.o.   MRN: [id number]  HPI Pt here today due to being exposed to mold in her home. The mold specialist stated that she needed to have a mold urine test done.  She states she has an extensive amount of mold in her house she relates that she is having a lot of trouble with headaches she wonders if she needs testing for molds  Patient also because of her orthopedic condition cannot roll her trash can out to the end of the road where they pick it up she needs a letter to allow her to utilize the waste management to get her trash  Pt also has area of concern on left foot. Area has been there for a good while. Pt states that daughter stated the area was discolored. No pain, burning, tingling, sometimes numbness.  Patient relates several calluses on both feet cause a lot of pain and discomfort  Has chronic pain and discomfort in her hips and her low back takes pain medicine usually 3 times daily to help with this she is requested that the amount be increased to 90 tablets/month she denies abusing it denies being addicted. Review of Systems  Constitutional: Negative for activity change and appetite change.  HENT: Negative for congestion and rhinorrhea.   Respiratory: Negative for cough and shortness of breath.   Cardiovascular: Negative for chest pain and leg swelling.  Gastrointestinal: Negative for abdominal pain, nausea and vomiting.  Skin: Negative for color change.  Neurological: Negative for dizziness and weakness.  Psychiatric/Behavioral: Negative for agitation and confusion.       Objective:   Physical Exam Vitals signs reviewed.  Constitutional:      General: She is not in acute distress. HENT:     Head: Normocephalic and atraumatic.  Eyes:     General:        Right eye: No discharge.        Left eye: No discharge.  Neck:     Trachea: No tracheal deviation.  Cardiovascular:     Rate and Rhythm: Normal  rate and regular rhythm.     Heart sounds: Normal heart sounds. No murmur.  Pulmonary:     Effort: Pulmonary effort is normal. No respiratory distress.     Breath sounds: Normal breath sounds.  Lymphadenopathy:     Cervical: No cervical adenopathy.  Skin:    General: Skin is warm and dry.  Neurological:     Mental Status: She is alert.     Coordination: Coordination normal.  Psychiatric:        Behavior: Behavior normal.           Assessment & Plan:  HTN- Patient was seen today as part of a visit regarding hypertension. The importance of healthy diet and regular physical activity was discussed. The importance of compliance with medications discussed.  Ideal goal is to keep blood pressure low elevated levels certainly below 140/90 when possible.  The patient was counseled that keeping blood pressure under control lessen his risk of complications.  The importance of regular follow-ups was discussed with the patient.  Low-salt diet such as DASH recommended.  Regular physical activity was recommended as well.  Patient was advised to keep regular follow-ups.  Osteoarthritis both hips worse on the left than the right uses pain medication to help this denies abusing it takes 3/day drug registry was sent 3 prescriptions were sent in  [facility]  exposure is very hard to know what to make of this we will connect with her allergist on whether or not they feel testing is necessary from our point of view testing is not necessary I am just not familiar with this  Stress related anxiety related conditions overall doing well continue on the Celexa  Referral for colonoscopy  Foot pain with bilateral calluses plus also a excoriated area on the inside aspect of her arch Recommend steroid cream for the arch area recommend podiatry referral for the calluses  25 minutes was spent with the patient.  This statement verifies that 25 minutes was indeed spent with the patient.  More than 50% of this  visit-total duration of the visit-was spent in counseling and coordination of care. The issues that the patient came in for today as reflected in the diagnosis (s) please refer to documentation for further details.

## 2018-07-04 ENCOUNTER — Encounter: Payer: Self-pay | Admitting: Family Medicine

## 2018-07-07 ENCOUNTER — Ambulatory Visit: Payer: Medicare HMO | Admitting: Family Medicine

## 2018-07-09 ENCOUNTER — Encounter: Payer: Self-pay | Admitting: Family Medicine

## 2018-07-09 ENCOUNTER — Encounter (INDEPENDENT_AMBULATORY_CARE_PROVIDER_SITE_OTHER): Payer: Self-pay | Admitting: *Deleted

## 2018-07-10 ENCOUNTER — Encounter (INDEPENDENT_AMBULATORY_CARE_PROVIDER_SITE_OTHER): Payer: Self-pay | Admitting: *Deleted

## 2018-07-15 ENCOUNTER — Ambulatory Visit: Payer: Medicare HMO | Admitting: Allergy and Immunology

## 2018-07-15 DIAGNOSIS — J309 Allergic rhinitis, unspecified: Secondary | ICD-10-CM

## 2018-07-17 ENCOUNTER — Encounter: Payer: BLUE CROSS/BLUE SHIELD | Admitting: Vascular Surgery

## 2018-07-17 ENCOUNTER — Other Ambulatory Visit: Payer: Self-pay

## 2018-07-17 ENCOUNTER — Encounter (HOSPITAL_COMMUNITY): Payer: BLUE CROSS/BLUE SHIELD

## 2018-07-17 DIAGNOSIS — I83892 Varicose veins of left lower extremities with other complications: Secondary | ICD-10-CM

## 2018-07-23 ENCOUNTER — Encounter: Payer: BLUE CROSS/BLUE SHIELD | Admitting: Vascular Surgery

## 2018-07-23 ENCOUNTER — Ambulatory Visit (HOSPITAL_COMMUNITY): Payer: BLUE CROSS/BLUE SHIELD

## 2018-07-29 ENCOUNTER — Ambulatory Visit: Payer: Medicare HMO | Admitting: Sports Medicine

## 2018-07-29 DIAGNOSIS — Z029 Encounter for administrative examinations, unspecified: Secondary | ICD-10-CM

## 2018-08-04 ENCOUNTER — Telehealth: Payer: Self-pay | Admitting: Allergy and Immunology

## 2018-08-04 DIAGNOSIS — H1010 Acute atopic conjunctivitis, unspecified eye: Secondary | ICD-10-CM

## 2018-08-04 DIAGNOSIS — J309 Allergic rhinitis, unspecified: Secondary | ICD-10-CM

## 2018-08-04 MED ORDER — FAMOTIDINE 40 MG PO TABS: 40.0000 mg | ORAL_TABLET | Freq: Every day | ORAL | 2 refills | Status: DC

## 2018-08-04 MED ORDER — BUDESONIDE-FORMOTEROL FUMARATE 160-4.5 MCG/ACT IN AERO: 2.0000 | INHALATION_SPRAY | Freq: Two times a day (BID) | RESPIRATORY_TRACT | 3 refills | Status: DC

## 2018-08-04 MED ORDER — TIOTROPIUM BROMIDE MONOHYDRATE 1.25 MCG/ACT IN AERS: 2.0000 | INHALATION_SPRAY | Freq: Every day | RESPIRATORY_TRACT | 2 refills | Status: DC

## 2018-08-04 MED ORDER — MONTELUKAST SODIUM 10 MG PO TABS: 10.0000 mg | ORAL_TABLET | Freq: Every day | ORAL | 3 refills | Status: DC

## 2018-08-04 NOTE — Telephone Encounter (Signed)
Refill sent in patient notified 

## 2018-08-04 NOTE — Telephone Encounter (Signed)
Patient rescheduled appt for a month from now, due to virus. Requesting refills on all her meds and she would like them sent to [facility] in [place].

## 2018-08-05 ENCOUNTER — Ambulatory Visit: Payer: Self-pay | Admitting: Allergy and Immunology

## 2018-08-05 ENCOUNTER — Telehealth: Payer: Self-pay | Admitting: Family Medicine

## 2018-08-05 NOTE — Telephone Encounter (Signed)
Med check on 07/02/18

## 2018-08-05 NOTE — Telephone Encounter (Signed)
Requesting refill for  oxyCODONE-acetaminophen (ROXICET) 5-325 MG tablet   Pharmacy:  [id number] - [place], [place] - [address]

## 2018-08-07 NOTE — Telephone Encounter (Signed)
I am not sure why I am getting this She just got the medicine filled on the 17th for 75 tablets Please talk with the patient find out what is going on She typically gets75 tablets every month So this should last for a while as in the next prescription should be due on April 16

## 2018-08-07 NOTE — Telephone Encounter (Signed)
Patient states she has plenty. She says she thought you had told her when she refilled her last refill to call for more. I advised that we could not refill again until April 16.

## 2018-08-20 ENCOUNTER — Ambulatory Visit: Payer: PPO | Admitting: Family Medicine

## 2018-08-27 ENCOUNTER — Encounter: Payer: Self-pay | Admitting: Family Medicine

## 2018-08-27 ENCOUNTER — Other Ambulatory Visit: Payer: Self-pay

## 2018-08-27 ENCOUNTER — Ambulatory Visit (INDEPENDENT_AMBULATORY_CARE_PROVIDER_SITE_OTHER): Payer: Medicare HMO | Admitting: Family Medicine

## 2018-08-27 VITALS — Wt 185.0 lb

## 2018-08-27 DIAGNOSIS — I10 Essential (primary) hypertension: Secondary | ICD-10-CM

## 2018-08-27 DIAGNOSIS — M160 Bilateral primary osteoarthritis of hip: Secondary | ICD-10-CM

## 2018-08-27 DIAGNOSIS — J4541 Moderate persistent asthma with (acute) exacerbation: Secondary | ICD-10-CM | POA: Diagnosis not present

## 2018-08-27 DIAGNOSIS — G894 Chronic pain syndrome: Secondary | ICD-10-CM

## 2018-08-27 MED ORDER — OXYCODONE-ACETAMINOPHEN 5-325 MG PO TABS: ORAL_TABLET | ORAL | 0 refills | Status: DC

## 2018-08-27 MED ORDER — TRIAMTERENE-HCTZ 37.5-25 MG PO TABS: 1.0000 | ORAL_TABLET | Freq: Every day | ORAL | 5 refills | Status: DC

## 2018-08-27 MED ORDER — ALBUTEROL SULFATE HFA 108 (90 BASE) MCG/ACT IN AERS: 2.0000 | INHALATION_SPRAY | RESPIRATORY_TRACT | 5 refills | Status: DC | PRN

## 2018-08-27 MED ORDER — CITALOPRAM HYDROBROMIDE 20 MG PO TABS: ORAL_TABLET | ORAL | 5 refills | Status: DC

## 2018-08-27 NOTE — Progress Notes (Signed)
   Subjective:    Patient ID: Stephanie [name], female    DOB: 1960/11/20, 58 y.o.   MRN: [id number]  HPI This patient was seen today for chronic pain  The medication list was reviewed and updated.   -Compliance with medication: Oxycodone- acetaminophen 5-325 mg  - Number patient states they take daily: no more than 3; some days none  -when was the last dose patient took? Last night about 7 pm  The patient was advised the importance of maintaining medication and not using illegal substances with these.  Here for refills and follow up  The patient was educated that we can provide 3 monthly scripts for their medication, it is their responsibility to follow the instructions.  Side effects or complications from medications: none  Patient is aware that pain medications are meant to minimize the severity of the pain to allow their pain levels to improve to allow for better function. They are aware of that pain medications cannot totally remove their pain.  Due for UDT ( at least once per year) :   Pt would like refill on Citalopram  Virtual Visit via Video Note  I connected with [name] on 08/27/18 at  1:10 PM EDT by a video enabled telemedicine application and verified that I am speaking with the correct person using two identifiers.   I discussed the limitations of evaluation and management by telemedicine and the availability of in person appointments. The patient expressed understanding and agreed to proceed.  History of Present Illness:    Observations/Objective:   Assessment and Plan:   Follow Up Instructions:    I discussed the assessment and treatment plan with the patient. The patient was provided an opportunity to ask questions and all were answered. The patient agreed with the plan and demonstrated an understanding of the instructions.   The patient was advised to call back or seek an in-person evaluation if the symptoms worsen or if the condition fails to  improve as anticipated.  I provided 15 minutes of non-face-to-face time during this encounter.   Marlowe [place], LPN  The patient is trying to do a good job of staying at home due to the coronavirus She does take her acid blocker on a regular basis denies any stomach pains Uses her breathing medicines for her chronic lung issues keeping this under good control Moods are doing well with the Celexa she is doing this on a regular basis Blood pressure medicine taken this on a regular basis Pain medicine she is using on a regular basis it is helping her hip pain significantly.      Review of Systems     Objective:   Physical Exam        Assessment & Plan:  Blood pressure watching salt continue medication previous readings good  Underlying asthma issues continue breathing medications doing well with this  Coronavirus protection discussed  Chronic pain 3 prescription sent in it does help her function without it she cannot function well she does not abuse the medicine she takes anywhere from 2 or 3/day

## 2018-09-02 ENCOUNTER — Ambulatory Visit: Payer: Self-pay | Admitting: Allergy and Immunology

## 2018-09-26 ENCOUNTER — Ambulatory Visit: Payer: Medicare HMO | Admitting: Family Medicine

## 2018-11-25 ENCOUNTER — Other Ambulatory Visit: Payer: Self-pay | Admitting: Family Medicine

## 2018-12-02 ENCOUNTER — Ambulatory Visit (INDEPENDENT_AMBULATORY_CARE_PROVIDER_SITE_OTHER): Payer: Medicare HMO | Admitting: Family Medicine

## 2018-12-02 ENCOUNTER — Other Ambulatory Visit: Payer: Self-pay

## 2018-12-02 DIAGNOSIS — Z13220 Encounter for screening for lipoid disorders: Secondary | ICD-10-CM

## 2018-12-02 DIAGNOSIS — Z1211 Encounter for screening for malignant neoplasm of colon: Secondary | ICD-10-CM

## 2018-12-02 DIAGNOSIS — G894 Chronic pain syndrome: Secondary | ICD-10-CM

## 2018-12-02 DIAGNOSIS — J4541 Moderate persistent asthma with (acute) exacerbation: Secondary | ICD-10-CM | POA: Diagnosis not present

## 2018-12-02 DIAGNOSIS — M160 Bilateral primary osteoarthritis of hip: Secondary | ICD-10-CM | POA: Diagnosis not present

## 2018-12-02 DIAGNOSIS — E785 Hyperlipidemia, unspecified: Secondary | ICD-10-CM

## 2018-12-02 DIAGNOSIS — I10 Essential (primary) hypertension: Secondary | ICD-10-CM

## 2018-12-02 DIAGNOSIS — Z79899 Other long term (current) drug therapy: Secondary | ICD-10-CM

## 2018-12-02 MED ORDER — OXYCODONE-ACETAMINOPHEN 5-325 MG PO TABS: ORAL_TABLET | ORAL | 0 refills | Status: DC

## 2018-12-02 NOTE — Progress Notes (Signed)
Subjective:  Telephone only  Patient ID: Stephanie [name], female    DOB: [birth year], 58 y.o.   MRN: [id number]  HPI  This patient was seen today for chronic pain  The medication list was reviewed and updated.   -Compliance with medication: yes  - Number patient states they take daily: 2-3  -when was the last dose patient took? today  The patient was advised the importance of maintaining medication and not using illegal substances with these.  Here for refills and follow up  The patient was educated that we can provide 3 monthly scripts for their medication, it is their responsibility to follow the instructions.  Side effects or complications from medications: none  Patient is aware that pain medications are meant to minimize the severity of the pain to allow their pain levels to improve to allow for better function. They are aware of that pain medications cannot totally remove their pain.   Virtual Visit via Video Note  I connected with Stephanie MonacoNorma [name] on 12/02/18 at  2:30 PM EDT by a video enabled telemedicine application and verified that I am speaking with the correct person using two identifiers.  Location: Patient: home Provider: office   I discussed the limitations of evaluation and management by telemedicine and the availability of in person appointments. The patient expressed understanding and agreed to proceed.  History of Present Illness:    Observations/Objective:   Assessment and Plan:   Follow Up Instructions:    I discussed the assessment and treatment plan with the patient. The patient was provided an opportunity to ask questions and all were answered. The patient agreed with the plan and demonstrated an understanding of the instructions.   The patient was advised to call back or seek an in-person evaluation if the symptoms worsen or if the condition fails to improve as anticipated.  I provided 17 minutes of non-face-to-face time during this  encounter.    15 minutes was spent with patient today discussing healthcare issues which they came.  More than 50% of this visit-total duration of visit-was spent in counseling and coordination of care.  Please see diagnosis regarding the focus of this coordination and care     Review of Systems  Constitutional: Negative for activity change and appetite change.  HENT: Negative for congestion and rhinorrhea.   Respiratory: Negative for cough and shortness of breath.   Cardiovascular: Negative for chest pain and leg swelling.  Gastrointestinal: Negative for abdominal pain, nausea and vomiting.  Musculoskeletal: Positive for arthralgias and back pain.  Skin: Negative for color change.  Neurological: Negative for dizziness and weakness.  Psychiatric/Behavioral: Negative for agitation and confusion.       Objective:   Physical Exam  Today's visit was via telephone Physical exam was not possible for this visit       Assessment & Plan:  The patient was seen in followup for chronic pain. A review over at their current pain status was discussed. Drug registry was checked. Prescriptions were given. Discussion was held regarding the importance of compliance with medication as well as pain medication contract.  Time for questions regarding pain management plan occurred. Importance of regular followup visits was discussed. Patient was informed that medication may cause drowsiness and should not be combined  with other medications/alcohol or street drugs. Patient was cautioned that medication could cause drowsiness. If the patient feels medication is causing altered alertness then do not drive or operate dangerous equipment.  1. Primary osteoarthritis of both hips Patient  has significant osteoarthritis of both hips she takes a pain medication to keep it in control she does not desire any type of surgery.  She does not abuse medicine.  Drug registry is checked.  Patient functions better  with the medications without it her pain is unbearable  2. Essential hypertension Blood pressure good control continue current measures watch diet avoid excessive salt  3. Moderate persistent asthma with acute exacerbation Overall breathing condition under good control no flareups recently  4. Chronic pain syndrome Does have chronic pain syndrome see above 3 scripts were given follow-up in 3 months lab work before next visit comprehensive visit on next visit  Colonoscopy needed but patient does not want to do it currently I recommend stool testing for blood this will be sent to the patient  Patient with history leukocytosis will do lab work before next visit

## 2018-12-03 NOTE — Progress Notes (Signed)
IFBOT test has been mailed to patient with instructions on how to obtain sample.

## 2018-12-03 NOTE — Progress Notes (Signed)
Labs orders placed, IFOBT order placed. All sent to patient via mail with instructions.

## 2018-12-03 NOTE — Addendum Note (Signed)
Addended by: Vicente Males on: 12/03/2018 08:51 AM   Modules accepted: Orders
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
## 2019-02-10 ENCOUNTER — Other Ambulatory Visit: Payer: Self-pay | Admitting: Family Medicine

## 2019-03-03 ENCOUNTER — Ambulatory Visit (INDEPENDENT_AMBULATORY_CARE_PROVIDER_SITE_OTHER): Payer: Medicare HMO | Admitting: Family Medicine

## 2019-03-03 ENCOUNTER — Other Ambulatory Visit: Payer: Self-pay

## 2019-03-03 ENCOUNTER — Encounter: Payer: Self-pay | Admitting: Family Medicine

## 2019-03-03 VITALS — BP 134/88 | Temp 96.8°F | Wt 169.0 lb

## 2019-03-03 DIAGNOSIS — I10 Essential (primary) hypertension: Secondary | ICD-10-CM | POA: Diagnosis not present

## 2019-03-03 DIAGNOSIS — F439 Reaction to severe stress, unspecified: Secondary | ICD-10-CM | POA: Diagnosis not present

## 2019-03-03 DIAGNOSIS — M160 Bilateral primary osteoarthritis of hip: Secondary | ICD-10-CM

## 2019-03-03 DIAGNOSIS — G894 Chronic pain syndrome: Secondary | ICD-10-CM

## 2019-03-03 DIAGNOSIS — J4541 Moderate persistent asthma with (acute) exacerbation: Secondary | ICD-10-CM

## 2019-03-03 DIAGNOSIS — Z13220 Encounter for screening for lipoid disorders: Secondary | ICD-10-CM | POA: Diagnosis not present

## 2019-03-03 DIAGNOSIS — Z79899 Other long term (current) drug therapy: Secondary | ICD-10-CM | POA: Diagnosis not present

## 2019-03-03 DIAGNOSIS — Z23 Encounter for immunization: Secondary | ICD-10-CM | POA: Diagnosis not present

## 2019-03-03 DIAGNOSIS — Z79891 Long term (current) use of opiate analgesic: Secondary | ICD-10-CM | POA: Diagnosis not present

## 2019-03-03 DIAGNOSIS — E785 Hyperlipidemia, unspecified: Secondary | ICD-10-CM | POA: Diagnosis not present

## 2019-03-03 LAB — MED LIST OPTION NOT SELECTED

## 2019-03-03 MED ORDER — TRIAMTERENE-HCTZ 37.5-25 MG PO TABS: 1.0000 | ORAL_TABLET | Freq: Every day | ORAL | 1 refills | Status: DC

## 2019-03-03 MED ORDER — OXYCODONE-ACETAMINOPHEN 5-325 MG PO TABS: ORAL_TABLET | ORAL | 0 refills | Status: DC

## 2019-03-03 MED ORDER — CITALOPRAM HYDROBROMIDE 20 MG PO TABS: ORAL_TABLET | ORAL | 5 refills | Status: DC

## 2019-03-03 NOTE — Progress Notes (Signed)
Subjective:    Patient ID: Stephanie [name], female    DOB: August 07, 1960, 58 y.o.   MRN: [id number]  HPI This patient was seen today for chronic pain  The medication list was reviewed and updated.   -Compliance with medication: oxycodone 5-325 mg  - Number patient states they take daily: 3  -when was the last dose patient took? 1 a.m this morning  The patient was advised the importance of maintaining medication and not using illegal substances with these.  Here for refills and follow up  The patient was educated that we can provide 3 monthly scripts for their medication, it is their responsibility to follow the instructions.  Side effects or complications from medications:   Patient is aware that pain medications are meant to minimize the severity of the pain to allow their pain levels to improve to allow for better function. They are aware of that pain medications cannot totally remove their pain.  Due for UDT ( at least once per year) : completed today   Pt states that her pain is worse. Pt states she is up for surgery but is not sure if she wants to have surgeon.    She relates that her blood pressure is best she knows is been doing okay taking her medicine watching her diet minimizing salt very difficult for her to stay active  She has severe osteoarthritis of both hips has had previous hip surgery on the right side hopes to have the left one and possible springtime is managing the best she can  She states the pain medicine does allow her to function better without it she would have a very difficult time getting around  She does relate she is under a lot of stress having to homeschool her grandchild she is very scared of the coronavirus we did discuss habits very important to avoid sick people and minimize contact with others  In addition today she is also having significant troubles with her asthma but she states is under fairly good control recently has not had any major  flareups  Review of Systems  Constitutional: Negative for activity change, appetite change and fatigue.  HENT: Negative for congestion and rhinorrhea.   Respiratory: Negative for cough and shortness of breath.   Cardiovascular: Negative for chest pain and leg swelling.  Gastrointestinal: Negative for abdominal pain and diarrhea.  Endocrine: Negative for polydipsia and polyphagia.  Skin: Negative for color change.  Neurological: Negative for dizziness and weakness.  Psychiatric/Behavioral: Negative for behavioral problems and confusion.       Objective:   Physical Exam Vitals signs reviewed.  Constitutional:      General: She is not in acute distress. HENT:     Head: Normocephalic and atraumatic.  Eyes:     General:        Right eye: No discharge.        Left eye: No discharge.  Neck:     Trachea: No tracheal deviation.  Cardiovascular:     Rate and Rhythm: Normal rate and regular rhythm.     Heart sounds: Normal heart sounds. No murmur.  Pulmonary:     Effort: Pulmonary effort is normal. No respiratory distress.     Breath sounds: Normal breath sounds.  Lymphadenopathy:     Cervical: No cervical adenopathy.  Skin:    General: Skin is warm and dry.  Neurological:     Mental Status: She is alert.     Coordination: Coordination normal.  Psychiatric:  Behavior: Behavior normal.           Assessment & Plan:  1. Long-term current use of opiate analgesic Her pain medicine does allow her to function without it she did have a very difficult time getting by she takes it 3 times daily drug registry was checked - ToxASSURE Select 13 (MW), Urine  2. Essential hypertension Blood pressure shows good control watch diet continue medications  3. Moderate persistent asthma with acute exacerbation Asthma puts her at higher risk of Covid we did discuss measures to help protect against this continue current medicine  4. Chronic pain syndrome Chronic pain syndrome related  to her bilateral hip pain she is thinking about having hip surgery coming up in the spring  5. Primary osteoarthritis of both hips See above  6. Need for vaccination Flu shot today  25 minutes was spent with the patient.  This statement verifies that 25 minutes was indeed spent with the patient.  More than 50% of this visit-total duration of the visit-was spent in counseling and coordination of care. The issues that the patient came in for today as reflected in the diagnosis (s) please refer to documentation for further details.  - Pneumococcal polysaccharide vaccine 23-valent greater than or equal to 2yo subcutaneous/IM - Flu Vaccine QUAD 6+ mos PF IM (Fluarix Quad PF)

## 2019-03-04 LAB — CBC WITH DIFFERENTIAL/PLATELET
Basophils Absolute: 0.1 10*3/uL (ref 0.0–0.2)
Basos: 1 %
EOS (ABSOLUTE): 0.4 10*3/uL (ref 0.0–0.4)
Eos: 5 %
Hematocrit: 44.8 % (ref 34.0–46.6)
Hemoglobin: 14.9 g/dL (ref 11.1–15.9)
Immature Grans (Abs): 0 10*3/uL (ref 0.0–0.1)
Immature Granulocytes: 0 %
Lymphocytes Absolute: 1.8 10*3/uL (ref 0.7–3.1)
Lymphs: 26 %
MCH: 29.3 pg (ref 26.6–33.0)
MCHC: 33.3 g/dL (ref 31.5–35.7)
MCV: 88 fL (ref 79–97)
Monocytes Absolute: 0.4 10*3/uL (ref 0.1–0.9)
Monocytes: 6 %
Neutrophils Absolute: 4.2 10*3/uL (ref 1.4–7.0)
Neutrophils: 62 %
Platelets: 291 10*3/uL (ref 150–450)
RBC: 5.09 x10E6/uL (ref 3.77–5.28)
RDW: 13.1 % (ref 11.7–15.4)
WBC: 6.8 10*3/uL (ref 3.4–10.8)

## 2019-03-04 LAB — BASIC METABOLIC PANEL
BUN/Creatinine Ratio: 14 (ref 9–23)
BUN: 11 mg/dL (ref 6–24)
CO2: 26 mmol/L (ref 20–29)
Calcium: 10.5 mg/dL — ABNORMAL HIGH (ref 8.7–10.2)
Chloride: 100 mmol/L (ref 96–106)
Creatinine, Ser: 0.81 mg/dL (ref 0.57–1.00)
GFR calc Af Amer: 93 mL/min/{1.73_m2} (ref >59)
GFR calc non Af Amer: 81 mL/min/{1.73_m2} (ref >59)
Glucose: 96 mg/dL (ref 65–99)
Potassium: 4.2 mmol/L (ref 3.5–5.2)
Sodium: 142 mmol/L (ref 134–144)

## 2019-03-04 LAB — HEPATIC FUNCTION PANEL
ALT: 16 IU/L (ref 0–32)
AST: 16 IU/L (ref 0–40)
Albumin: 4.8 g/dL (ref 3.8–4.9)
Alkaline Phosphatase: 115 IU/L (ref 39–117)
Bilirubin Total: 0.7 mg/dL (ref 0.0–1.2)
Bilirubin, Direct: 0.19 mg/dL (ref 0.00–0.40)
Total Protein: 7.8 g/dL (ref 6.0–8.5)

## 2019-03-04 LAB — LIPID PANEL
Chol/HDL Ratio: 2.1 ratio (ref 0.0–4.4)
Cholesterol, Total: 185 mg/dL (ref 100–199)
HDL: 88 mg/dL (ref >39)
LDL Chol Calc (NIH): 81 mg/dL (ref 0–99)
Triglycerides: 89 mg/dL (ref 0–149)
VLDL Cholesterol Cal: 16 mg/dL (ref 5–40)

## 2019-03-06 ENCOUNTER — Other Ambulatory Visit: Payer: Self-pay | Admitting: *Deleted

## 2019-03-06 DIAGNOSIS — Z1211 Encounter for screening for malignant neoplasm of colon: Secondary | ICD-10-CM

## 2019-03-06 LAB — IFOBT (OCCULT BLOOD): IFOBT: NEGATIVE

## 2019-03-06 LAB — TOXASSURE SELECT 13 (MW), URINE

## 2019-03-06 LAB — SPECIMEN STATUS REPORT

## 2019-03-08 ENCOUNTER — Encounter: Payer: Self-pay | Admitting: Family Medicine

## 2019-04-24 ENCOUNTER — Telehealth: Payer: Self-pay | Admitting: Family Medicine

## 2019-04-24 NOTE — Telephone Encounter (Signed)
Script wrote and await dr Nicki Reaper signature

## 2019-04-24 NOTE — Telephone Encounter (Signed)
Ok, prescribe in dr scotts name as he knows pt and can sign off on any orders/approvals

## 2019-04-24 NOTE — Telephone Encounter (Signed)
Pt would like an order for a bedside comm[facility] due to her other hip giving her problems.

## 2019-04-29 ENCOUNTER — Telehealth: Payer: Self-pay | Admitting: Family Medicine

## 2019-04-29 DIAGNOSIS — R531 Weakness: Secondary | ICD-10-CM

## 2019-04-29 DIAGNOSIS — M25552 Pain in left hip: Secondary | ICD-10-CM

## 2019-04-29 NOTE — Telephone Encounter (Signed)
May have shower stool Home health to consult for her to see if she qualifies for home aide I am not opposed to her getting a home aide but it is really more up to insurance

## 2019-04-29 NOTE — Telephone Encounter (Signed)
Pt wonders status of her request for a bedside commode & shower stool - please send to [facility]  Also left hip pain  Also wonders about trying to get in home aid   Please advise

## 2019-04-29 NOTE — Telephone Encounter (Signed)
Script faxed for bedside commode. Please advise regarding home aid and shower stool. Thank you

## 2019-04-30 NOTE — Telephone Encounter (Signed)
Shower stool script written out;awaiting signature. Once signed will fax to [facility]. Referral put in for Home health. Pt contacted and verbalized understanding.

## 2019-05-11 ENCOUNTER — Telehealth: Payer: Self-pay | Admitting: Family Medicine

## 2019-05-11 NOTE — Telephone Encounter (Signed)
Patient said the orders for bedside commode and shower stool was suppose to go to [facility] and not [facility].  Said she called back last week because she didn't know that [facility] was in [place] and she wants to stay local  I don't see a message about [facility] but that is where she wants the scripts to be sent.

## 2019-05-11 NOTE — Telephone Encounter (Signed)
Signed  thankyou

## 2019-05-11 NOTE — Telephone Encounter (Signed)
Script written out and at nurses station to be signed. Please advise. Thank you

## 2019-05-11 NOTE — Telephone Encounter (Signed)
Script faxed to [facility] and pt is aware

## 2019-05-21 DIAGNOSIS — M1612 Unilateral primary osteoarthritis, left hip: Secondary | ICD-10-CM | POA: Diagnosis not present

## 2019-05-21 DIAGNOSIS — Z96641 Presence of right artificial hip joint: Secondary | ICD-10-CM | POA: Diagnosis not present

## 2019-05-21 DIAGNOSIS — Z471 Aftercare following joint replacement surgery: Secondary | ICD-10-CM | POA: Diagnosis not present

## 2019-05-22 DIAGNOSIS — M1612 Unilateral primary osteoarthritis, left hip: Secondary | ICD-10-CM | POA: Diagnosis not present

## 2019-05-26 ENCOUNTER — Telehealth: Payer: Self-pay | Admitting: Family Medicine

## 2019-05-26 NOTE — Telephone Encounter (Signed)
Script written out for bedside commode and at nurses station awaiting signature. Please advise. Thank you

## 2019-05-26 NOTE — Telephone Encounter (Signed)
Stephanie [name] calling back saying that [employer] take her insurance so now needs the script for bedside commode to be sent to [facility] in [place]. She doesn't want the shower stool now.  Fax # [phone]

## 2019-05-27 NOTE — Telephone Encounter (Signed)
Script faxed and pt is aware.  

## 2019-05-27 NOTE — Telephone Encounter (Signed)
Was signed thank you 

## 2019-06-02 DIAGNOSIS — G118 Other hereditary ataxias: Secondary | ICD-10-CM | POA: Diagnosis not present

## 2019-06-02 DIAGNOSIS — R270 Ataxia, unspecified: Secondary | ICD-10-CM | POA: Diagnosis not present

## 2019-06-02 DIAGNOSIS — M169 Osteoarthritis of hip, unspecified: Secondary | ICD-10-CM | POA: Diagnosis not present

## 2019-06-03 ENCOUNTER — Ambulatory Visit (INDEPENDENT_AMBULATORY_CARE_PROVIDER_SITE_OTHER): Payer: Medicare HMO | Admitting: Family Medicine

## 2019-06-03 ENCOUNTER — Other Ambulatory Visit: Payer: Self-pay

## 2019-06-03 ENCOUNTER — Encounter: Payer: Self-pay | Admitting: Family Medicine

## 2019-06-03 DIAGNOSIS — M160 Bilateral primary osteoarthritis of hip: Secondary | ICD-10-CM

## 2019-06-03 DIAGNOSIS — Z79891 Long term (current) use of opiate analgesic: Secondary | ICD-10-CM | POA: Diagnosis not present

## 2019-06-03 DIAGNOSIS — F439 Reaction to severe stress, unspecified: Secondary | ICD-10-CM

## 2019-06-03 DIAGNOSIS — G894 Chronic pain syndrome: Secondary | ICD-10-CM | POA: Diagnosis not present

## 2019-06-03 NOTE — Progress Notes (Signed)
Subjective:    Patient ID: Stephanie [name], female    DOB: 1961/03/09, 59 y.o.   MRN: [id number]  HPI This patient was seen today for chronic pain The patient relates that her current pain medicine is just not helping she states that she has a lot of severe hip pain she has to get around with a cane and a walker she states that she is doubtful that she will be able to keep going on the weights he has she truly feels she needs a stronger pain medicine  In addition to this she also states that the orthopedic doctor is recommending surgery she does not like the idea of that but she is willing to go forward with that if necessary  She relates she gets frustrated by all of this but denies getting depressed She relates that her Celexa is doing a good job she would like to continue it does help her with her stress  She feels her blood pressure under good control medicine she denies any headaches chest pain or shortness of breath  Her breathing issues and allergies are doing well but she is trying to do the best she can at minimizing the risk of Covid Patient denies any falls The medication list was reviewed and updated.   -Compliance with medication: takes 3 a day  - Number patient states they take daily: 3  -when was the last dose patient took? today  The patient was advised the importance of maintaining medication and not using illegal substances with these.  Here for refills and follow up  The patient was educated that we can provide 3 monthly scripts for their medication, it is their responsibility to follow the instructions.  Side effects or complications from medications: itch a little bit  Patient is aware that pain medications are meant to minimize the severity of the pain to allow their pain levels to improve to allow for better function. They are aware of that pain medications cannot totally remove their pain.  Due for UDT ( at least once per year) : last one was done on  03/03/19   Left hip pain. Going on since November. States it is affecting her quality of life.   Virtual Visit via Telephone Note  I connected with Stephanie [name] on 06/03/19 at  1:10 PM EST by telephone and verified that I am speaking with the correct person using two identifiers.  Location: Patient: home Provider: office   I discussed the limitations, risks, security and privacy concerns of performing an evaluation and management service by telephone and the availability of in person appointments. I also discussed with the patient that there may be a patient responsible charge related to this service. The patient expressed understanding and agreed to proceed.   History of Present Illness:    Observations/Objective:   Assessment and Plan:   Follow Up Instructions:    I discussed the assessment and treatment plan with the patient. The patient was provided an opportunity to ask questions and all were answered. The patient agreed with the plan and demonstrated an understanding of the instructions.   The patient was advised to call back or seek an in-person evaluation if the symptoms worsen or if the condition fails to improve as anticipated.  I provided 30 minutes of non-face-to-face time during this encounter.     Fall Risk  03/03/2019 03/03/2019  Falls in the past year? 0 0  Follow up Falls evaluation completed Falls evaluation completed  Review of Systems  Constitutional: Negative for activity change and appetite change.  HENT: Negative for congestion and rhinorrhea.   Respiratory: Negative for cough and shortness of breath.   Cardiovascular: Negative for chest pain and leg swelling.  Gastrointestinal: Negative for abdominal pain, nausea and vomiting.  Musculoskeletal: Positive for arthralgias, back pain and gait problem.  Skin: Negative for color change.  Neurological: Negative for dizziness and weakness.  Psychiatric/Behavioral: Negative for agitation  and confusion.       Objective:   Physical Exam Today's visit was via telephone Physical exam was not possible for this visit        Assessment & Plan:  Patient is severely debilitated because of her hip She is unable to do her ADLs It is necessary for her to have her family member stay at home Her family member has quit work in order to be at home to help her out Apparently there is some program to help take her family member to help her take care of her self if that is the case we are more than willing she states she will get her insurance company to connect with [place]  1. Chronic pain syndrome The patient was seen in followup for chronic pain. A review over at their current pain status was discussed. Drug registry was checked. Prescriptions were given. Discussion was held regarding the importance of compliance with medication as well as pain medication contract.  Time for questions regarding pain management plan occurred. Importance of regular followup visits was discussed. Patient was informed that medication may cause drowsiness and should not be combined  with other medications/alcohol or street drugs. Patient was cautioned that medication could cause drowsiness. If the patient feels medication is causing altered alertness then do not drive or operate dangerous equipment.  Current pain medicine not working out therefore shifted over to oxycodone 5 mg 3 times daily.  2. Primary osteoarthritis of both hips Please see above patient is contemplating having surgery I believe that would be a good idea for her to do so but she is can wait till springtime after she has had vaccine in the Covid cases are coming down  3. Long-term current use of opiate analgesic Overall doing well with medication please see discussion above - oxyCODONE-acetaminophen (PERCOCET/ROXICET) 5-325 MG tablet; One pill po tid prn to last 30 days.Cautioned drowsiness.  Dispense: 90 tablet; Refill: 0 -  oxyCODONE-acetaminophen (PERCOCET/ROXICET) 5-325 MG tablet; One pill po tid prn to last 30 days.Cautioned drowsiness  Dispense: 90 tablet; Refill: 0 - oxyCODONE-acetaminophen (PERCOCET/ROXICET) 5-325 MG tablet; One pill po tid prn to last 30 days.Cautioned drowsiness  Dispense: 90 tablet; Refill: 0  4. Stress Patient should continue her citalopram.  I believe she is under a lot of stress handling fairly well.

## 2019-06-06 MED ORDER — OXYCODONE-ACETAMINOPHEN 5-325 MG PO TABS: ORAL_TABLET | ORAL | 0 refills | Status: DC

## 2019-06-06 MED ORDER — CITALOPRAM HYDROBROMIDE 20 MG PO TABS: ORAL_TABLET | ORAL | 5 refills | Status: DC

## 2019-06-16 ENCOUNTER — Ambulatory Visit: Payer: Medicare HMO | Admitting: Allergy and Immunology

## 2019-06-22 DIAGNOSIS — M1612 Unilateral primary osteoarthritis, left hip: Secondary | ICD-10-CM | POA: Diagnosis not present

## 2019-06-22 DIAGNOSIS — Z1231 Encounter for screening mammogram for malignant neoplasm of breast: Secondary | ICD-10-CM | POA: Diagnosis not present

## 2019-07-03 ENCOUNTER — Ambulatory Visit: Payer: Medicare HMO | Admitting: Family Medicine

## 2019-07-20 DIAGNOSIS — M1612 Unilateral primary osteoarthritis, left hip: Secondary | ICD-10-CM | POA: Diagnosis not present

## 2019-08-20 DIAGNOSIS — M1612 Unilateral primary osteoarthritis, left hip: Secondary | ICD-10-CM | POA: Diagnosis not present

## 2019-09-01 ENCOUNTER — Ambulatory Visit (INDEPENDENT_AMBULATORY_CARE_PROVIDER_SITE_OTHER): Payer: Medicare HMO | Admitting: Allergy and Immunology

## 2019-09-01 ENCOUNTER — Encounter: Payer: Self-pay | Admitting: Allergy and Immunology

## 2019-09-01 ENCOUNTER — Other Ambulatory Visit: Payer: Self-pay

## 2019-09-01 VITALS — BP 124/86 | HR 72 | Resp 18 | Ht 64.0 in

## 2019-09-01 DIAGNOSIS — Z886 Allergy status to analgesic agent status: Secondary | ICD-10-CM

## 2019-09-01 DIAGNOSIS — H1010 Acute atopic conjunctivitis, unspecified eye: Secondary | ICD-10-CM | POA: Diagnosis not present

## 2019-09-01 DIAGNOSIS — J4540 Moderate persistent asthma, uncomplicated: Secondary | ICD-10-CM

## 2019-09-01 DIAGNOSIS — J309 Allergic rhinitis, unspecified: Secondary | ICD-10-CM

## 2019-09-01 DIAGNOSIS — K219 Gastro-esophageal reflux disease without esophagitis: Secondary | ICD-10-CM

## 2019-09-01 MED ORDER — OMEPRAZOLE 40 MG PO CPDR: 40.0000 mg | DELAYED_RELEASE_CAPSULE | Freq: Two times a day (BID) | ORAL | 5 refills | Status: DC

## 2019-09-01 MED ORDER — MONTELUKAST SODIUM 10 MG PO TABS: 10.0000 mg | ORAL_TABLET | Freq: Every day | ORAL | 5 refills | Status: DC

## 2019-09-01 MED ORDER — BUDESONIDE-FORMOTEROL FUMARATE 160-4.5 MCG/ACT IN AERO: 2.0000 | INHALATION_SPRAY | Freq: Two times a day (BID) | RESPIRATORY_TRACT | 6 refills | Status: DC

## 2019-09-01 NOTE — Progress Notes (Signed)
[facility] [facility] [place] [facility] [place] [facility] [name] -    Follow-up Note  Referring Provider: Babs Sciara, MD Primary Provider: Babs Sciara, MD Date of Office Visit: 09/01/2019  Subjective:   Stephanie [name] (DOB: 10/11/1960) is a 59 y.o. female who [facility] in re-evaluation of the following:  HPI: Stephanie [name] returns to this clinic in evaluation of severe asthma, allergic rhinitis, reflux, nonsteroidal anti-inflammatory drug hypersensitivity state.  Her last visit to this clinic was 17 June 2018.  During her last visit she was recovering from a severe episode of influenza that gave rise to significant inflammation of her airway.  She required a multitude of different medications to get that under control but ever since she resolved that issue she has really done well regarding her asthma.  Rarely does she use a short acting bronchodilator averaging out to about 1 time per week.  However, she cannot really exert herself because of multiple musculoskeletal issues including her hip and back.  If she does attempt to exert herself she definitely gets short of breath.  She does not use any Symbicort or Spiriva or montelukast at this point in time.  It does not sound as though she has required a systemic steroid to treat an episode of sinusitis.  Overall she thinks her nose is doing relatively well at this point.  It does not sound as though she has required an antibiotic to treat an episode of sinusitis.  She does not use any nasal steroids at this point in time.  Her reflux is under excellent control on her current therapy which includes a proton pump inhibitor twice a day and a H2 receptor blocker in the evening.  She still continues to drink 1 coffee per day.  She remains away from all nonsteroidal anti-inflammatory drugs at this point.  She occasionally uses a narcotic for her hip and back pain.  She will be having a left hip replacement at  some point in the near future.  Allergies as of 09/01/2019      Reactions   Amoxicillin Rash   fash swelled up and itching 04/2018   Aspirin Anaphylaxis   Hydrocodone Itching      Latex Hives, Itching   Levaquin [levofloxacin]    Rash on chest no hives 04/2018      Medication List      albuterol 108 (90 Base) MCG/ACT inhaler Commonly known as: Proventil HFA Inhale 2 puffs into the lungs every 4 (four) hours as needed for wheezing or shortness of breath.   Breo Ellipta 200-25 MCG/INH Aepb Generic drug: fluticasone furoate-vilanterol Inhale 1 puff into the lungs daily.   budesonide-formoterol 160-4.5 MCG/ACT inhaler Commonly known as: Symbicort Inhale 2 puffs into the lungs 2 (two) times daily.   CALCIUM 600 PO Take 1 capsule by mouth daily.   citalopram 20 MG tablet Commonly known as: CELEXA One half tablet qam for 7 days, then one tablet qam   EpiPen 2-Pak 0.3 mg/0.3 mL Soaj injection Generic drug: EPINEPHrine Inject 0.3 mg into the muscle once as needed (allergic reaction).   famotidine 40 MG tablet Commonly known as: PEPCID Take 1 tablet (40 mg total) by mouth daily.   montelukast 10 MG tablet Commonly known as: SINGULAIR Take 1 tablet (10 mg total) by mouth at bedtime.   oxyCODONE-acetaminophen 5-325 MG tablet Commonly known as: PERCOCET/ROXICET One pill po tid prn to last 30 days.Cautioned drowsiness.   oxyCODONE-acetaminophen 5-325 MG tablet  Commonly known as: PERCOCET/ROXICET One pill po tid prn to last 30 days.Cautioned drowsiness   oxyCODONE-acetaminophen 5-325 MG tablet Commonly known as: PERCOCET/ROXICET One pill po tid prn to last 30 days.Cautioned drowsiness   Tiotropium Bromide Monohydrate 1.25 MCG/ACT Aers Commonly known as: Spiriva Respimat Inhale 2 puffs into the lungs daily.   triamterene-hydrochlorothiazide 37.5-25 MG tablet Commonly known as: MAXZIDE-25 Take 1 tablet by mouth daily.   vitamin C 100 MG tablet Take 100 mg by mouth  daily.   vitamin E 45 MG (100 UNITS) capsule Take 100 Units by mouth daily.       Past Medical History:  Diagnosis Date  . Allergic rhinitis   . Arthritis   . Asthma   . Depression   . Heart murmur   . Hypertension   . Pneumonia    hx of x 3     Past Surgical History:  Procedure Laterality Date  . [place]  . TONSILLECTOMY    . TOTAL HIP ARTHROPLASTY Right 03/27/2017   Procedure: RIGHT TOTAL HIP ARTHROPLASTY ANTERIOR APPROACH;  Surgeon: Gaynelle Arabian, MD;  Location: [facility];  Service: Orthopedics;  Laterality: Right;    Review of systems negative except as noted in HPI / PMHx or noted below:  Review of Systems  Constitutional: Negative.   HENT: Negative.   Eyes: Negative.   Respiratory: Negative.   Cardiovascular: Negative.   Gastrointestinal: Negative.   Genitourinary: Negative.   Musculoskeletal: Negative.   Skin: Negative.   Neurological: Negative.   Endo/Heme/Allergies: Negative.   Psychiatric/Behavioral: Negative.      Objective:   Vitals:   09/01/19 0927  BP: 124/86  Pulse: 72  Resp: 18  SpO2: 98%   Height: 5\' 4"  (162.6 cm)      Physical Exam Constitutional:      Appearance: She is not diaphoretic.  HENT:     Head: Normocephalic.     Right Ear: Tympanic membrane, ear canal and external ear normal.     Left Ear: Tympanic membrane, ear canal and external ear normal.     Nose: Nose normal. No mucosal edema or rhinorrhea.     Mouth/Throat:     Pharynx: Uvula midline. No oropharyngeal exudate.  Eyes:     Conjunctiva/sclera: Conjunctivae normal.  Neck:     Thyroid: No thyromegaly.     Trachea: Trachea normal. No tracheal tenderness or tracheal deviation.  Cardiovascular:     Rate and Rhythm: Normal rate and regular rhythm.     Heart sounds: S1 normal and S2 normal. Murmur (Systolic) present.  Pulmonary:     Effort: No respiratory distress.     Breath sounds: Normal breath sounds. No stridor. No wheezing or rales.   Lymphadenopathy:     Head:     Right side of head: No tonsillar adenopathy.     Left side of head: No tonsillar adenopathy.     Cervical: No cervical adenopathy.  Skin:    Findings: No erythema or rash.     Nails: There is no clubbing.  Neurological:     Mental Status: She is alert.     Diagnostics:    Spirometry was performed and demonstrated an FEV1 of 1.76 at 81 % of predicted.  The patient had an Asthma Control Test with the following results: ACT Total Score: 15.    Assessment and Plan:   1. Asthma, moderate persistent, well-controlled   2. Allergic rhinoconjunctivitis   3. Allergy to NSAIDs   4.  Gastroesophageal reflux disease, unspecified whether esophagitis present     1. Continue to Treat inflammation:   A. Symbicort 180 2 inhalations 3-7 times per week depending on asthma activity  B. Nasacort 1-2 sprays each nostril 3-7 times per week depending on nose activity  2. Continue to Treat reflux:   A.  Omeprazole 40mg  1 tablet two times per day  B.  Can attempt to discontinue famotidine  3. Continue Albuterol HFA 2 puffs every 4-6 hours if needed  4. Continue OTC antihistamine and Epi-Pen if needed  5. Obtain COVID vaccine  6. Return to clinic in 6 months or earlier if problem  I have encouraged Milazzo to use some dose of anti-inflammatory agents for both her upper and lower airway in a preventative manner even if this is a relatively low dose averaging out to just a few times per week.  She can certainly increase her Symbicort and Nasacort to full dose should she develop problems in the future.  As well, there appears to be an opportunity to consolidate her medical treatment by having her discontinue famotidine as her reflux appears to be under very good control.  Assuming she does well with this plan I will see her back in this clinic in 6 months or earlier if there is a problem.  Sharma Covert, MD Allergy / Immunology [facility]

## 2019-09-01 NOTE — Patient Instructions (Addendum)
  1. Continue to Treat inflammation:   A. Symbicort 180 2 inhalations 3-7 times per week depending on asthma activity  B. Nasacort 1-2 sprays each nostril 3-7 times per week depending on nose activity  2. Continue to Treat reflux:   A.  Omeprazole 40mg  1 tablet two times per day  B.  Can attempt to discontinue famotidine  3. Continue Albuterol HFA 2 puffs every 4-6 hours if needed  4. Continue OTC antihistamine and Epi-Pen if needed  5. Obtain COVID vaccine  6. Return to clinic in 6 months or earlier if problem

## 2019-09-02 ENCOUNTER — Encounter: Payer: Self-pay | Admitting: Allergy and Immunology

## 2019-09-09 DIAGNOSIS — H524 Presbyopia: Secondary | ICD-10-CM | POA: Diagnosis not present

## 2019-09-09 DIAGNOSIS — Z0100 Encounter for examination of eyes and vision without abnormal findings: Secondary | ICD-10-CM | POA: Diagnosis not present

## 2019-09-10 ENCOUNTER — Other Ambulatory Visit: Payer: Self-pay | Admitting: *Deleted

## 2019-09-10 MED ORDER — OMEPRAZOLE 40 MG PO CPDR: 40.0000 mg | DELAYED_RELEASE_CAPSULE | Freq: Two times a day (BID) | ORAL | 1 refills | Status: DC

## 2019-09-11 ENCOUNTER — Telehealth (INDEPENDENT_AMBULATORY_CARE_PROVIDER_SITE_OTHER): Payer: Medicare HMO | Admitting: Family Medicine

## 2019-09-11 ENCOUNTER — Telehealth: Payer: Self-pay | Admitting: Family Medicine

## 2019-09-11 DIAGNOSIS — M160 Bilateral primary osteoarthritis of hip: Secondary | ICD-10-CM | POA: Diagnosis not present

## 2019-09-11 DIAGNOSIS — Z79891 Long term (current) use of opiate analgesic: Secondary | ICD-10-CM | POA: Diagnosis not present

## 2019-09-11 DIAGNOSIS — F321 Major depressive disorder, single episode, moderate: Secondary | ICD-10-CM

## 2019-09-11 DIAGNOSIS — G894 Chronic pain syndrome: Secondary | ICD-10-CM

## 2019-09-11 DIAGNOSIS — I10 Essential (primary) hypertension: Secondary | ICD-10-CM | POA: Diagnosis not present

## 2019-09-11 MED ORDER — OXYCODONE-ACETAMINOPHEN 5-325 MG PO TABS: ORAL_TABLET | ORAL | 0 refills | Status: DC

## 2019-09-11 MED ORDER — SERTRALINE HCL 100 MG PO TABS: ORAL_TABLET | ORAL | 1 refills | Status: DC

## 2019-09-11 MED ORDER — TRIAMTERENE-HCTZ 37.5-25 MG PO TABS: 1.0000 | ORAL_TABLET | Freq: Every day | ORAL | 1 refills | Status: DC

## 2019-09-11 NOTE — Progress Notes (Signed)
Subjective:    Patient ID: Stephanie [name], female    DOB: 1961-03-15, 59 y.o.   MRN: [id number]  HPI This patient was seen today for chronic pain Patient relates that this pain medicine necessary for her hip arthritis she hopes to have surgery later this summer and at some point she hopes to taper off the pain medicine.  She states her pain medicine does allow her to function she denies abusing it states it does not cause drowsiness The medication list was reviewed and updated.   -Compliance with medication: Oxycodone 5-325 mg  - Number patient states they take daily: 2-3  -when was the last dose patient took? This morning  The patient was advised the importance of maintaining medication and not using illegal substances with these.  Here for refills and follow up  The patient was educated that we can provide 3 monthly scripts for their medication, it is their responsibility to follow the instructions.  Side effects or complications from medications: constipation and itching   Patient is aware that pain medications are meant to minimize the severity of the pain to allow their pain levels to improve to allow for better function. They are aware of that pain medications cannot totally remove their pain.  Due for UDT ( at least once per year) :   Virtual Visit via Telephone Note  I connected with Stephanie [name] on 09/11/19 at  1:40 PM EDT by telephone and verified that I am speaking with the correct person using two identifiers.  Location: Patient: home Provider: office   I discussed the limitations, risks, security and privacy concerns of performing an evaluation and management service by telephone and the availability of in person appointments. I also discussed with the patient that there may be a patient responsible charge related to this service. The patient expressed understanding and agreed to proceed.   History of Present Illness:     Observations/Objective:   Assessment and Plan:   Follow Up Instructions:    I discussed the assessment and treatment plan with the patient. The patient was provided an opportunity to ask questions and all were answered. The patient agreed with the plan and demonstrated an understanding of the instructions.   The patient was advised to call back or seek an in-person evaluation if the symptoms worsen or if the condition fails to improve as anticipated.  I provided 20 minutes of non-face-to-face time during this encounter.           Review of Systems  Constitutional: Negative for activity change and appetite change.  HENT: Negative for congestion and rhinorrhea.   Respiratory: Negative for cough and shortness of breath.   Cardiovascular: Negative for chest pain and leg swelling.  Gastrointestinal: Negative for abdominal pain, nausea and vomiting.  Skin: Negative for color change.  Neurological: Negative for dizziness and weakness.  Psychiatric/Behavioral: Negative for agitation and confusion.       Objective:   Physical Exam  Today's visit was via telephone Physical exam was not possible for this visit   Patient states blood pressure under good control taking her medicine as directed    Assessment & Plan:  The patient was seen in followup for chronic pain. A review over at their current pain status was discussed. Drug registry was checked. Prescriptions were given.  Regular follow-up recommended. Discussion was held regarding the importance of compliance with medication as well as pain medication contract.  Patient was informed that medication may cause drowsiness and should not  be combined  with other medications/alcohol or street drugs. If the patient feels medication is causing altered alertness then do not drive or operate dangerous equipment.  Continue with current medication.  Refills given.  Follow-up in person on next visit She states blood pressure under  good control.  Patient is also dealing with major depression but she denies being suicidal.  She states the pandemic along with her hip pain and being isolated is causing her to be depressed once again she denies being suicidal she is open to trying a different medicine so therefore we will go with sertraline we will also have the patient do a follow-up with [place] in 4 weeks virtually I offered counseling to her she would like to think about this for now

## 2019-09-11 NOTE — Telephone Encounter (Signed)
Ms. lamees, gable are scheduled for a virtual visit with your provider today.    Just as we do with appointments in the office, we must obtain your consent to participate.  Your consent will be active for this visit and any virtual visit you may have with one of our providers in the next 365 days.    If you have a MyChart account, I can also send a copy of this consent to you electronically.  All virtual visits are billed to your insurance company just like a traditional visit in the office.  As this is a virtual visit, video technology does not allow for your provider to perform a traditional examination.  This may limit your provider's ability to fully assess your condition.  If your provider identifies any concerns that need to be evaluated in person or the need to arrange testing such as labs, EKG, etc, we will make arrangements to do so.    Although advances in technology are sophisticated, we cannot ensure that it will always work on either your end or our end.  If the connection with a video visit is poor, we may have to switch to a telephone visit.  With either a video or telephone visit, we are not always able to ensure that we have a secure connection.   I need to obtain your verbal consent now.   Are you willing to proceed with your visit today?   Eleah Lahaie has provided verbal consent on 09/11/2019 for a virtual visit (video or telephone).   Marlowe Shores, LPN 07/19/6576  4:69 PM

## 2019-09-15 ENCOUNTER — Other Ambulatory Visit: Payer: Self-pay | Admitting: *Deleted

## 2019-09-15 ENCOUNTER — Telehealth: Payer: Self-pay | Admitting: Unknown Physician Specialty

## 2019-09-15 MED ORDER — OMEPRAZOLE 40 MG PO CPDR: 40.0000 mg | DELAYED_RELEASE_CAPSULE | Freq: Two times a day (BID) | ORAL | 1 refills | Status: DC

## 2019-09-15 NOTE — Telephone Encounter (Signed)
I connected by phone with Stephanie [name] and/or patient's caregiver on 09/15/2019 at 4:47 PM to discuss the potential vaccination through our Homebound vaccination initiative.   Prevaccination Checklist for COVID-19 Vaccines  1.  Are you feeling sick today? no  2.  Have you ever received a dose of a COVID-19 vaccine?1st pfizer dose and doesn't know how she can get her 2nd  3.  Have you ever had an allergic reaction: (This would include a severe reaction [ e.g., anaphylaxis] that required treatment with epinephrine or EpiPen or that caused you to go to the hospital.  It would also include an allergic reaction that occurred within 4 hours that caused hives, swelling, or respiratory distress, including wheezing.) A.  A previous dose of COVID-19 vaccine. no  B.  A vaccine or injectable therapy that contains multiple components, one of which is a COVID-19 vaccine component, but it is not known which component elicited the immediate reaction. no  C.  Are you allergic to polyethylene glycol? no   4.  Have you ever had an allergic reaction to another vaccine (other than COVID-19 vaccine) or an injectable medication? (This would include a severe reaction [ e.g., anaphylaxis] that required treatment with epinephrine or EpiPen or that caused you to go to the hospital.  It would also include an allergic reaction that occurred within 4 hours that caused hives, swelling, or respiratory distress, including wheezing.)  no   5.  Have you ever had a severe allergic reaction (e.g., anaphylaxis) to something other than a component of the COVID-19 vaccine, or any vaccine or injectable medication?  This would include food, pet, venom, environmental, or oral medication allergies.  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  Have you received any vaccine in the last 14 days? yes   7.  Have you ever had a positive test for COVID-19 or has a doctor ever told you that you had COVID-19?  no   8.   Have you received passive antibody therapy (monoclonal antibodies or convalescent serum) as a treatment for COVID-19? no   9.  Do you have a weakened immune system caused by something such as HIV infection or cancer or do you take immunosuppressive drugs or therapies?  no   10.  Do you have a bleeding disorder or are you taking a blood thinner? no   11.  Are you pregnant or breast-feeding? no   12.  Do you have dermal fillers? no   __________________   This patient is a 59 y.o. female that meets the FDA criteria to receive homebound vaccination. Patient or parent/caregiver understands they have the option to accept or refuse homebound vaccination.  Patient passed the pre-screening checklist and would like to proceed with homebound vaccination.  Based on questionnaire above, I recommend the patient be observed for 30 minutes.  There are an estimated 0  other household members/caregivers who are also interested in receiving the vaccine.    I will send the patient's information to our scheduling team who will reach out to schedule the patient and potential caregiver/family members for homebound vaccination.   [name] 09/15/2019 4:47 PM

## 2019-09-19 DIAGNOSIS — M1612 Unilateral primary osteoarthritis, left hip: Secondary | ICD-10-CM | POA: Diagnosis not present

## 2019-09-22 ENCOUNTER — Other Ambulatory Visit: Payer: Self-pay | Admitting: *Deleted

## 2019-10-07 ENCOUNTER — Telehealth: Payer: Self-pay | Admitting: Family Medicine

## 2019-10-07 NOTE — Telephone Encounter (Signed)
Patient dropped off forms to be completed by physician. Forms are in your box to be completed. She states need as soon as possible please.

## 2019-10-12 NOTE — Telephone Encounter (Signed)
This is a rather extensive disability form.  This is not simple to fill out.  It is requiring multiple details that it is very important to get this right otherwise it could easily be denied.  Therefore office visit is necessary to fill out this form. Potentially we could fit her in Thursday morning.  Otherwise it could be several days before she could get it in for an office visit  Please call patient try to schedule her up for this evaluation/disability form

## 2019-10-13 NOTE — Telephone Encounter (Signed)
Tc- no voicemail.  

## 2019-10-13 NOTE — Telephone Encounter (Signed)
Patient notified and made an appointment to discuss form.

## 2019-10-13 NOTE — Telephone Encounter (Signed)
Schedule appointment for 6/3

## 2019-10-15 ENCOUNTER — Other Ambulatory Visit: Payer: Self-pay

## 2019-10-15 ENCOUNTER — Ambulatory Visit (INDEPENDENT_AMBULATORY_CARE_PROVIDER_SITE_OTHER): Payer: Medicare HMO | Admitting: Family Medicine

## 2019-10-15 ENCOUNTER — Encounter: Payer: Self-pay | Admitting: Family Medicine

## 2019-10-15 VITALS — BP 124/86 | Ht 64.0 in | Wt 185.0 lb

## 2019-10-15 DIAGNOSIS — M160 Bilateral primary osteoarthritis of hip: Secondary | ICD-10-CM

## 2019-10-15 DIAGNOSIS — I10 Essential (primary) hypertension: Secondary | ICD-10-CM | POA: Diagnosis not present

## 2019-10-15 NOTE — Patient Instructions (Signed)
DASH Eating Plan DASH stands for "Dietary Approaches to Stop Hypertension." The DASH eating plan is a healthy eating plan that has been shown to reduce high blood pressure (hypertension). It may also reduce your risk for type 2 diabetes, heart disease, and stroke. The DASH eating plan may also help with weight loss. What are tips for following this plan?  General guidelines  Avoid eating more than 2,300 mg (milligrams) of salt (sodium) a day. If you have hypertension, you may need to reduce your sodium intake to 1,500 mg a day.  Limit alcohol intake to no more than 1 drink a day for nonpregnant women and 2 drinks a day for men. One drink equals 12 oz of beer, 5 oz of wine, or 1 oz of hard liquor.  Work with your health care provider to maintain a healthy body weight or to lose weight. Ask what an ideal weight is for you.  Get at least 30 minutes of exercise that causes your heart to beat faster (aerobic exercise) most days of the week. Activities may include walking, swimming, or biking.  Work with your health care provider or diet and nutrition specialist (dietitian) to adjust your eating plan to your individual calorie needs. Reading food labels   Check food labels for the amount of sodium per serving. Choose foods with less than 5 percent of the Daily Value of sodium. Generally, foods with less than 300 mg of sodium per serving fit into this eating plan.  To find whole grains, look for the word "whole" as the first word in the ingredient list. Shopping  Buy products labeled as "low-sodium" or "no salt added."  Buy fresh foods. Avoid canned foods and premade or frozen meals. Cooking  Avoid adding salt when cooking. Use salt-free seasonings or herbs instead of table salt or sea salt. Check with your health care provider or pharmacist before using salt substitutes.  Do not fry foods. Cook foods using healthy methods such as baking, boiling, grilling, and broiling instead.  Cook with  heart-healthy oils, such as olive, canola, soybean, or sunflower oil. Meal planning  Eat a balanced diet that includes: ? 5 or more servings of fruits and vegetables each day. At each meal, try to fill half of your plate with fruits and vegetables. ? Up to 6-8 servings of whole grains each day. ? Less than 6 oz of lean meat, poultry, or fish each day. A 3-oz serving of meat is about the same size as a deck of cards. One egg equals 1 oz. ? 2 servings of low-fat dairy each day. ? A serving of nuts, seeds, or beans 5 times each week. ? Heart-healthy fats. Healthy fats called Omega-3 fatty acids are found in foods such as flaxseeds and coldwater fish, like sardines, salmon, and mackerel.  Limit how much you eat of the following: ? Canned or prepackaged foods. ? Food that is high in trans fat, such as fried foods. ? Food that is high in saturated fat, such as fatty meat. ? Sweets, desserts, sugary drinks, and other foods with added sugar. ? Full-fat dairy products.  Do not salt foods before eating.  Try to eat at least 2 vegetarian meals each week.  Eat more home-cooked food and less restaurant, buffet, and fast food.  When eating at a restaurant, ask that your food be prepared with less salt or no salt, if possible. What foods are recommended? The items listed may not be a complete list. Talk with your dietitian about   what dietary choices are best for you. Grains Whole-grain or whole-wheat bread. Whole-grain or whole-wheat pasta. Brown rice. Oatmeal. Quinoa. Bulgur. Whole-grain and low-sodium cereals. Pita bread. Low-fat, low-sodium crackers. Whole-wheat flour tortillas. Vegetables Fresh or frozen vegetables (raw, steamed, roasted, or grilled). Low-sodium or reduced-sodium tomato and vegetable juice. Low-sodium or reduced-sodium tomato sauce and tomato paste. Low-sodium or reduced-sodium canned vegetables. Fruits All fresh, dried, or frozen fruit. Canned fruit in natural juice (without  added sugar). Meat and other protein foods Skinless chicken or turkey. Ground chicken or turkey. Pork with fat trimmed off. Fish and seafood. Egg whites. Dried beans, peas, or lentils. Unsalted nuts, nut butters, and seeds. Unsalted canned beans. Lean cuts of beef with fat trimmed off. Low-sodium, lean deli meat. Dairy Low-fat (1%) or fat-free (skim) milk. Fat-free, low-fat, or reduced-fat cheeses. Nonfat, low-sodium ricotta or cottage cheese. Low-fat or nonfat yogurt. Low-fat, low-sodium cheese. Fats and oils Soft margarine without trans fats. Vegetable oil. Low-fat, reduced-fat, or light mayonnaise and salad dressings (reduced-sodium). Canola, safflower, olive, soybean, and sunflower oils. Avocado. Seasoning and other foods Herbs. Spices. Seasoning mixes without salt. Unsalted popcorn and pretzels. Fat-free sweets. What foods are not recommended? The items listed may not be a complete list. Talk with your dietitian about what dietary choices are best for you. Grains Baked goods made with fat, such as croissants, muffins, or some breads. Dry pasta or rice meal packs. Vegetables Creamed or fried vegetables. Vegetables in a cheese sauce. Regular canned vegetables (not low-sodium or reduced-sodium). Regular canned tomato sauce and paste (not low-sodium or reduced-sodium). Regular tomato and vegetable juice (not low-sodium or reduced-sodium). Pickles. Olives. Fruits Canned fruit in a light or heavy syrup. Fried fruit. Fruit in cream or butter sauce. Meat and other protein foods Fatty cuts of meat. Ribs. Fried meat. Bacon. Sausage. Bologna and other processed lunch meats. Salami. Fatback. Hotdogs. Bratwurst. Salted nuts and seeds. Canned beans with added salt. Canned or smoked fish. Whole eggs or egg yolks. Chicken or turkey with skin. Dairy Whole or 2% milk, cream, and half-and-half. Whole or full-fat cream cheese. Whole-fat or sweetened yogurt. Full-fat cheese. Nondairy creamers. Whipped toppings.  Processed cheese and cheese spreads. Fats and oils Butter. Stick margarine. Lard. Shortening. Ghee. Bacon fat. Tropical oils, such as coconut, palm kernel, or palm oil. Seasoning and other foods Salted popcorn and pretzels. Onion salt, garlic salt, seasoned salt, table salt, and sea salt. Worcestershire sauce. Tartar sauce. Barbecue sauce. Teriyaki sauce. Soy sauce, including reduced-sodium. Steak sauce. Canned and packaged gravies. Fish sauce. Oyster sauce. Cocktail sauce. Horseradish that you find on the shelf. Ketchup. Mustard. Meat flavorings and tenderizers. Bouillon cubes. Hot sauce and Tabasco sauce. Premade or packaged marinades. Premade or packaged taco seasonings. Relishes. Regular salad dressings. Where to find more information:  National Heart, Lung, and Blood Institute: www.nhlbi.nih.gov  American Heart Association: www.heart.org Summary  The DASH eating plan is a healthy eating plan that has been shown to reduce high blood pressure (hypertension). It may also reduce your risk for type 2 diabetes, heart disease, and stroke.  With the DASH eating plan, you should limit salt (sodium) intake to 2,300 mg a day. If you have hypertension, you may need to reduce your sodium intake to 1,500 mg a day.  When on the DASH eating plan, aim to eat more fresh fruits and vegetables, whole grains, lean proteins, low-fat dairy, and heart-healthy fats.  Work with your health care provider or diet and nutrition specialist (dietitian) to adjust your eating plan to your   individual calorie needs. This information is not intended to replace advice given to you by your health care provider. Make sure you discuss any questions you have with your health care provider. Document Revised: 04/12/2017 Document Reviewed: 04/23/2016 Elsevier Patient Education  2020 Elsevier Inc.  

## 2019-10-15 NOTE — Progress Notes (Signed)
   Subjective:    Patient ID: Stephanie [name], female    DOB: 05/06/1961, 59 y.o.   MRN: [id number]  HPIpt wants disability forms filled out.  This patient has disability due to low back pain degenerative disc disease plus also bilateral osteoarthritis of the hips had previous right hip surgery and left hip surgery is coming up patient is disabled via the government  She wanted forms filled out today these were filled out in her presence.  All of the form is accurate to her answers to the questions     Review of Systems  Constitutional: Negative for activity change and appetite change.  HENT: Negative for congestion and rhinorrhea.   Respiratory: Negative for cough and shortness of breath.   Cardiovascular: Negative for chest pain and leg swelling.  Gastrointestinal: Negative for abdominal pain, nausea and vomiting.  Musculoskeletal: Positive for arthralgias. Negative for back pain.  Skin: Negative for color change.  Neurological: Negative for dizziness and weakness.  Psychiatric/Behavioral: Negative for agitation and confusion.       Objective:   Physical Exam Vitals reviewed.  Constitutional:      General: She is not in acute distress. HENT:     Head: Normocephalic and atraumatic.  Eyes:     General:        Right eye: No discharge.        Left eye: No discharge.  Neck:     Trachea: No tracheal deviation.  Cardiovascular:     Rate and Rhythm: Normal rate and regular rhythm.     Heart sounds: Normal heart sounds. No murmur.  Pulmonary:     Effort: Pulmonary effort is normal. No respiratory distress.     Breath sounds: Normal breath sounds.  Lymphadenopathy:     Cervical: No cervical adenopathy.  Skin:    General: Skin is warm and dry.  Neurological:     Mental Status: She is alert.     Coordination: Coordination normal.  Psychiatric:        Behavior: Behavior normal.    Significant low back pain significant limitation of movement in both hips walks with a  cane       Assessment & Plan:  Patient with chronic back pain chronic hip pain unable to do work currently. Unable to sit for any significant length of time without standing or laying down Unable to stand for any significant length of time without sitting or laying down Moderate amount of pain have any take pain medication to keep under decent control He is responsible for the pain medicine Also because of her low hip and back limitations it would limit her ability to do even sedentary work She is permanently disabled

## 2019-10-20 DIAGNOSIS — M1612 Unilateral primary osteoarthritis, left hip: Secondary | ICD-10-CM | POA: Diagnosis not present

## 2019-10-29 ENCOUNTER — Telehealth: Payer: Self-pay | Admitting: Family Medicine

## 2019-10-29 NOTE — Telephone Encounter (Signed)
Nurses Please let the patient know that we did receive a request regarding surgical clearance We will go ahead and and fill out the form and send it back to her surgeon Currently they have her scheduled August 11 I believe it is in the patient's best interest to do a follow-up visit somewhere within 2 weeks before her surgery to make sure everything is spot on heading into the surgery please schedule accordingly

## 2019-10-30 NOTE — Telephone Encounter (Signed)
Pt contacted and verbalized understanding.  

## 2019-10-30 NOTE — Telephone Encounter (Signed)
End of July is fine that is close to her surgery and would be a good time to do follow-up thank you

## 2019-10-30 NOTE — Telephone Encounter (Signed)
Pt has follow up scheduled for 12/11/2019; should that appt be moved up? Please advise. Thank you

## 2019-11-19 DIAGNOSIS — M1612 Unilateral primary osteoarthritis, left hip: Secondary | ICD-10-CM | POA: Diagnosis not present

## 2019-12-11 ENCOUNTER — Other Ambulatory Visit: Payer: Self-pay

## 2019-12-11 ENCOUNTER — Encounter: Payer: Self-pay | Admitting: Family Medicine

## 2019-12-11 ENCOUNTER — Ambulatory Visit (INDEPENDENT_AMBULATORY_CARE_PROVIDER_SITE_OTHER): Payer: Medicare HMO | Admitting: Family Medicine

## 2019-12-11 VITALS — BP 138/88 | HR 92 | Temp 98.2°F | Ht 64.0 in | Wt 185.0 lb

## 2019-12-11 DIAGNOSIS — M79673 Pain in unspecified foot: Secondary | ICD-10-CM

## 2019-12-11 DIAGNOSIS — G894 Chronic pain syndrome: Secondary | ICD-10-CM | POA: Diagnosis not present

## 2019-12-11 DIAGNOSIS — R011 Cardiac murmur, unspecified: Secondary | ICD-10-CM | POA: Diagnosis not present

## 2019-12-11 DIAGNOSIS — L84 Corns and callosities: Secondary | ICD-10-CM

## 2019-12-11 DIAGNOSIS — Z79891 Long term (current) use of opiate analgesic: Secondary | ICD-10-CM

## 2019-12-11 DIAGNOSIS — M160 Bilateral primary osteoarthritis of hip: Secondary | ICD-10-CM | POA: Diagnosis not present

## 2019-12-11 DIAGNOSIS — I10 Essential (primary) hypertension: Secondary | ICD-10-CM | POA: Diagnosis not present

## 2019-12-11 MED ORDER — KETOCONAZOLE 2 % EX CREA: TOPICAL_CREAM | CUTANEOUS | 3 refills | Status: DC

## 2019-12-11 MED ORDER — OXYCODONE-ACETAMINOPHEN 5-325 MG PO TABS: ORAL_TABLET | ORAL | 0 refills | Status: DC

## 2019-12-11 NOTE — Patient Instructions (Signed)
DASH Eating Plan DASH stands for "Dietary Approaches to Stop Hypertension." The DASH eating plan is a healthy eating plan that has been shown to reduce high blood pressure (hypertension). It may also reduce your risk for type 2 diabetes, heart disease, and stroke. The DASH eating plan may also help with weight loss. What are tips for following this plan?  General guidelines  Avoid eating more than 2,300 mg (milligrams) of salt (sodium) a day. If you have hypertension, you may need to reduce your sodium intake to 1,500 mg a day.  Limit alcohol intake to no more than 1 drink a day for nonpregnant women and 2 drinks a day for men. One drink equals 12 oz of beer, 5 oz of wine, or 1 oz of hard liquor.  Work with your health care provider to maintain a healthy body weight or to lose weight. Ask what an ideal weight is for you.  Get at least 30 minutes of exercise that causes your heart to beat faster (aerobic exercise) most days of the week. Activities may include walking, swimming, or biking.  Work with your health care provider or diet and nutrition specialist (dietitian) to adjust your eating plan to your individual calorie needs. Reading food labels   Check food labels for the amount of sodium per serving. Choose foods with less than 5 percent of the Daily Value of sodium. Generally, foods with less than 300 mg of sodium per serving fit into this eating plan.  To find whole grains, look for the word "whole" as the first word in the ingredient list. Shopping  Buy products labeled as "low-sodium" or "no salt added."  Buy fresh foods. Avoid canned foods and premade or frozen meals. Cooking  Avoid adding salt when cooking. Use salt-free seasonings or herbs instead of table salt or sea salt. Check with your health care provider or pharmacist before using salt substitutes.  Do not fry foods. Cook foods using healthy methods such as baking, boiling, grilling, and broiling instead.  Cook with  heart-healthy oils, such as olive, canola, soybean, or sunflower oil. Meal planning  Eat a balanced diet that includes: ? 5 or more servings of fruits and vegetables each day. At each meal, try to fill half of your plate with fruits and vegetables. ? Up to 6-8 servings of whole grains each day. ? Less than 6 oz of lean meat, poultry, or fish each day. A 3-oz serving of meat is about the same size as a deck of cards. One egg equals 1 oz. ? 2 servings of low-fat dairy each day. ? A serving of nuts, seeds, or beans 5 times each week. ? Heart-healthy fats. Healthy fats called Omega-3 fatty acids are found in foods such as flaxseeds and coldwater fish, like sardines, salmon, and mackerel.  Limit how much you eat of the following: ? Canned or prepackaged foods. ? Food that is high in trans fat, such as fried foods. ? Food that is high in saturated fat, such as fatty meat. ? Sweets, desserts, sugary drinks, and other foods with added sugar. ? Full-fat dairy products.  Do not salt foods before eating.  Try to eat at least 2 vegetarian meals each week.  Eat more home-cooked food and less restaurant, buffet, and fast food.  When eating at a restaurant, ask that your food be prepared with less salt or no salt, if possible. What foods are recommended? The items listed may not be a complete list. Talk with your dietitian about   what dietary choices are best for you. Grains Whole-grain or whole-wheat bread. Whole-grain or whole-wheat pasta. Brown rice. Oatmeal. Quinoa. Bulgur. Whole-grain and low-sodium cereals. Pita bread. Low-fat, low-sodium crackers. Whole-wheat flour tortillas. Vegetables Fresh or frozen vegetables (raw, steamed, roasted, or grilled). Low-sodium or reduced-sodium tomato and vegetable juice. Low-sodium or reduced-sodium tomato sauce and tomato paste. Low-sodium or reduced-sodium canned vegetables. Fruits All fresh, dried, or frozen fruit. Canned fruit in natural juice (without  added sugar). Meat and other protein foods Skinless chicken or turkey. Ground chicken or turkey. Pork with fat trimmed off. Fish and seafood. Egg whites. Dried beans, peas, or lentils. Unsalted nuts, nut butters, and seeds. Unsalted canned beans. Lean cuts of beef with fat trimmed off. Low-sodium, lean deli meat. Dairy Low-fat (1%) or fat-free (skim) milk. Fat-free, low-fat, or reduced-fat cheeses. Nonfat, low-sodium ricotta or cottage cheese. Low-fat or nonfat yogurt. Low-fat, low-sodium cheese. Fats and oils Soft margarine without trans fats. Vegetable oil. Low-fat, reduced-fat, or light mayonnaise and salad dressings (reduced-sodium). Canola, safflower, olive, soybean, and sunflower oils. Avocado. Seasoning and other foods Herbs. Spices. Seasoning mixes without salt. Unsalted popcorn and pretzels. Fat-free sweets. What foods are not recommended? The items listed may not be a complete list. Talk with your dietitian about what dietary choices are best for you. Grains Baked goods made with fat, such as croissants, muffins, or some breads. Dry pasta or rice meal packs. Vegetables Creamed or fried vegetables. Vegetables in a cheese sauce. Regular canned vegetables (not low-sodium or reduced-sodium). Regular canned tomato sauce and paste (not low-sodium or reduced-sodium). Regular tomato and vegetable juice (not low-sodium or reduced-sodium). Pickles. Olives. Fruits Canned fruit in a light or heavy syrup. Fried fruit. Fruit in cream or butter sauce. Meat and other protein foods Fatty cuts of meat. Ribs. Fried meat. Bacon. Sausage. Bologna and other processed lunch meats. Salami. Fatback. Hotdogs. Bratwurst. Salted nuts and seeds. Canned beans with added salt. Canned or smoked fish. Whole eggs or egg yolks. Chicken or turkey with skin. Dairy Whole or 2% milk, cream, and half-and-half. Whole or full-fat cream cheese. Whole-fat or sweetened yogurt. Full-fat cheese. Nondairy creamers. Whipped toppings.  Processed cheese and cheese spreads. Fats and oils Butter. Stick margarine. Lard. Shortening. Ghee. Bacon fat. Tropical oils, such as coconut, palm kernel, or palm oil. Seasoning and other foods Salted popcorn and pretzels. Onion salt, garlic salt, seasoned salt, table salt, and sea salt. Worcestershire sauce. Tartar sauce. Barbecue sauce. Teriyaki sauce. Soy sauce, including reduced-sodium. Steak sauce. Canned and packaged gravies. Fish sauce. Oyster sauce. Cocktail sauce. Horseradish that you find on the shelf. Ketchup. Mustard. Meat flavorings and tenderizers. Bouillon cubes. Hot sauce and Tabasco sauce. Premade or packaged marinades. Premade or packaged taco seasonings. Relishes. Regular salad dressings. Where to find more information:  National Heart, Lung, and Blood Institute: www.nhlbi.nih.gov  American Heart Association: www.heart.org Summary  The DASH eating plan is a healthy eating plan that has been shown to reduce high blood pressure (hypertension). It may also reduce your risk for type 2 diabetes, heart disease, and stroke.  With the DASH eating plan, you should limit salt (sodium) intake to 2,300 mg a day. If you have hypertension, you may need to reduce your sodium intake to 1,500 mg a day.  When on the DASH eating plan, aim to eat more fresh fruits and vegetables, whole grains, lean proteins, low-fat dairy, and heart-healthy fats.  Work with your health care provider or diet and nutrition specialist (dietitian) to adjust your eating plan to your   individual calorie needs. This information is not intended to replace advice given to you by your health care provider. Make sure you discuss any questions you have with your health care provider. Document Revised: 04/12/2017 Document Reviewed: 04/23/2016 Elsevier Patient Education  2020 Elsevier Inc.  

## 2019-12-11 NOTE — Progress Notes (Addendum)
Subjective:    Patient ID: Stephanie [name], female    DOB: 1961-02-21, 59 y.o.   MRN: [id number]  HPI This patient was seen today for chronic pain. Takes for back pain and left hip pain.   The medication list was reviewed and updated.   -Compliance with medication: takes 3 a day  - Number patient states they take daily: takes 3 a day  -when was the last dose patient took? Today  The patient was advised the importance of maintaining medication and not using illegal substances with these.  Here for refills and follow up  The patient was educated that we can provide 3 monthly scripts for their medication, it is their responsibility to follow the instructions.  Side effects or complications from medications: constipation  Patient is aware that pain medications are meant to minimize the severity of the pain to allow their pain levels to improve to allow for better function. They are aware of that pain medications cannot totally remove their pain.  Due for UDT ( at least once per year) : last one 03/03/19. Gave new pain management contract.   Scale of 1 to 10 ( 1 is least 10 is most) Your pain level without the medicine: 10 Your pain level with medication: 6 or 7   Scale 1 to 10 ( 1-helps very little, 10 helps very well) How well does your pain medication reduce your pain so you can function better through out the day? 6 or 7   Concerns about rash on feet. Was taking a cream for yeast that helped but ran out. Would like a refill. Currently has one red place on side of right foot and says it itches.  Toes have been locking up.   Would like to discuss getting rx for wheelchair.         Review of Systems  Constitutional: Negative.   HENT: Negative.   Eyes: Negative.   Respiratory: Negative.   Cardiovascular: Negative.   Gastrointestinal: Positive for constipation.  Genitourinary: Negative.   Musculoskeletal: Negative.   Skin: Positive for rash.  Allergic/Immunologic:  Negative.    Concerned with balance and falls. Last fell 2 weeks ago getting up from bed and recliner. Now refuses to get back into bed and is sleeping in recliner. Feels this is a balance thing.     Objective:   Physical Exam Vitals reviewed.  Constitutional:      General: She is not in acute distress. HENT:     Head: Normocephalic.  Cardiovascular:     Rate and Rhythm: Normal rate and regular rhythm.     Heart sounds: Murmur heard.   Pulmonary:     Effort: Pulmonary effort is normal.     Breath sounds: Normal breath sounds.  Lymphadenopathy:     Cervical: No cervical adenopathy.  Neurological:     Mental Status: She is alert.  Psychiatric:        Behavior: Behavior normal.     Patient's lack of mobility affects her ADLs.  A prescription for all wheelchair was given Patient also has a very difficult time preparing her meals as well as doing any type of housework therefore she is requesting a personal aide for 2 to 3 hours daily which I believe is reasonable once the patient has surgery and has recovered I believe she will be capable of taking care of herself      Assessment & Plan:  1. Essential hypertension Pressure reasonable continue current manner measures watch diet  try to stay active Recheck the patient's blood pressure in 2 to 3 weeks 2. Chronic pain syndrome Pain under decent control medication helps to a degree but it wears off quickly she has been on this level for quite some time she is requested to go up to 4 pills/day this was granted will follow-up within a few weeks after her surgery She will be reassessed after her surgery in order to see if we can start tapering her medication 3. Primary osteoarthritis of both hips Severe osteoarthritis of both hips she has had surgery on 1 the other 1 will have surgery coming up in September  4. Foot callus Podiatry referral - Ambulatory referral to Podiatry  5. Pain of foot, unspecified laterality Podiatry  referral - Ambulatory referral to Podiatry  6. Heart murmur Patient has a heart murmur on exam it is best for the patient go ahead and have an echo we will review this with the patient when she comes back no sign of heart failure - ECHOCARDIOGRAM COMPLETE; Future  7. Long-term current use of opiate analgesic Please see above caution drowsiness do not exceed 4 tablets/day - oxyCODONE-acetaminophen (PERCOCET/ROXICET) 5-325 MG tablet; One pill po qid prn to last 30 days.Cautioned drowsiness.  Dispense: 120 tablet; Refill: 0 - oxyCODONE-acetaminophen (PERCOCET/ROXICET) 5-325 MG tablet; One pill po qid prn to last 30 days.Cautioned drowsiness  Dispense: 120 tablet; Refill: 0

## 2019-12-16 ENCOUNTER — Telehealth: Payer: Self-pay | Admitting: Family Medicine

## 2019-12-16 ENCOUNTER — Other Ambulatory Visit: Payer: Self-pay

## 2019-12-16 ENCOUNTER — Ambulatory Visit (HOSPITAL_COMMUNITY)
Admission: RE | Admit: 2019-12-16 | Discharge: 2019-12-16 | Disposition: A | Discharge status: Home / Self Care | Payer: Medicare HMO | Source: Ambulatory Visit | Attending: Family Medicine | Admitting: Family Medicine

## 2019-12-16 DIAGNOSIS — R011 Cardiac murmur, unspecified: Secondary | ICD-10-CM | POA: Diagnosis not present

## 2019-12-16 LAB — ECHOCARDIOGRAM COMPLETE
AR max vel: 1.7 cm2
AV Area VTI: 2.02 cm2
AV Area mean vel: 1.93 cm2
AV Mean grad: 5.1 mmHg
AV Peak grad: 11.5 mmHg
Ao pk vel: 1.7 m/s
Area-P 1/2: 2.62 cm2
S' Lateral: 2.77 cm

## 2019-12-16 MED ORDER — AMLODIPINE BESYLATE 5 MG PO TABS
5.0000 mg | ORAL_TABLET | Freq: Every day | ORAL | 2 refills | Status: DC
Start: 2019-12-16 — End: 2020-03-07

## 2019-12-16 NOTE — Telephone Encounter (Signed)
Add amlodipine 5 mg 1 daily, #30, with 2 refills, follow-up office visit next week to recheck blood pressure with myself or with Clydie Braun

## 2019-12-16 NOTE — Telephone Encounter (Signed)
Patient went to ER for echo gram today and her blood pressure was running high 156/110,154/100 and auto cuff was 153/105 heart rate 89. She states has had headache since 12/12/2019. [phone] Please advise

## 2019-12-16 NOTE — Progress Notes (Signed)
*  PRELIMINARY RESULTS* Echocardiogram 2D Echocardiogram has been performed.  Stacey Drain 12/16/2019, 2:11 PM

## 2019-12-16 NOTE — Telephone Encounter (Signed)
Prescription sent electronically to pharmacy. Patient notified- need to call patient back in the morning to schedule follow up office visit.

## 2019-12-17 NOTE — Telephone Encounter (Signed)
Appointment scheduled.

## 2019-12-18 ENCOUNTER — Other Ambulatory Visit (HOSPITAL_COMMUNITY): Payer: BLUE CROSS/BLUE SHIELD

## 2019-12-20 ENCOUNTER — Telehealth: Payer: Self-pay | Admitting: Family Medicine

## 2019-12-20 DIAGNOSIS — R531 Weakness: Secondary | ICD-10-CM

## 2019-12-20 DIAGNOSIS — M1612 Unilateral primary osteoarthritis, left hip: Secondary | ICD-10-CM | POA: Diagnosis not present

## 2019-12-20 NOTE — Telephone Encounter (Signed)
Nurses Please connect with patient Her insurance company will not fill the prescription for her lightweight wheelchair without having further additional evaluation/input  Therefore she would need to have a physical therapy consultation for ambulation purposes on whether or not she needs a wheelchair  If the patient is interested in pursuing forward with this please refer to physical therapy for wheelchair evaluation  Otherwise the patient will have to make due with the walker

## 2019-12-21 NOTE — Telephone Encounter (Signed)
Pt returned call and verbalized understanding. Referral placed to PT.

## 2019-12-21 NOTE — Telephone Encounter (Signed)
Left message to return call 

## 2019-12-21 NOTE — Addendum Note (Signed)
Addended by: Marlowe Shores on: 12/21/2019 08:55 AM   Modules accepted: Orders

## 2019-12-22 ENCOUNTER — Ambulatory Visit: Payer: Medicare HMO | Admitting: Family Medicine

## 2019-12-24 ENCOUNTER — Ambulatory Visit (INDEPENDENT_AMBULATORY_CARE_PROVIDER_SITE_OTHER): Payer: Medicare HMO | Admitting: Family Medicine

## 2019-12-24 ENCOUNTER — Other Ambulatory Visit: Payer: Self-pay

## 2019-12-24 ENCOUNTER — Encounter: Payer: Self-pay | Admitting: Family Medicine

## 2019-12-24 VITALS — BP 138/82 | HR 99 | Temp 96.2°F | Ht 64.0 in | Wt 182.6 lb

## 2019-12-24 DIAGNOSIS — F439 Reaction to severe stress, unspecified: Secondary | ICD-10-CM | POA: Diagnosis not present

## 2019-12-24 DIAGNOSIS — I10 Essential (primary) hypertension: Secondary | ICD-10-CM | POA: Diagnosis not present

## 2019-12-24 NOTE — Progress Notes (Signed)
Patient ID: Stephanie [name], female    DOB: 21-Oct-1960, 59 y.o.   MRN: [id number]   Chief Complaint  Patient presents with  . Hypertension   Subjective:    HPI Pt here for blood pressure check. BP has been high. Went in for ECHO last Wednesday and provider check BP and got 165/115 next 155/110 (manual and cuff). Pt has been under a lot of traumatic stress in last 2 months. Pt has had headaches mostly on left side, pt states her whole head hurts. Some chest tightening last week. Pt was placed on Amlodipine last week by Dr.Scott. Anti depressant meds were changed and pt had diarrhea. Pt is trying to have hip replacement and needs to get BP under control.    Medical History Stephanie [name] has a past medical history of Allergic rhinitis, Arthritis, Asthma, Depression, Heart murmur, Hypertension, and Pneumonia.   Outpatient Encounter Medications as of 12/24/2019  Medication Sig  . albuterol (PROVENTIL HFA) 108 (90 Base) MCG/ACT inhaler Inhale 2 puffs into the lungs every 4 (four) hours as needed for wheezing or shortness of breath.  Marland Kitchen amLODipine (NORVASC) 5 MG tablet Take 1 tablet (5 mg total) by mouth daily.  . Ascorbic Acid (VITAMIN C) 100 MG tablet Take 100 mg by mouth daily.  . budesonide-formoterol (SYMBICORT) 160-4.5 MCG/ACT inhaler Inhale 2 puffs into the lungs 2 (two) times daily.  Marland Kitchen EPINEPHrine (EPIPEN 2-PAK) 0.3 mg/0.3 mL IJ SOAJ injection Inject 0.3 mg into the muscle once as needed (allergic reaction).  Marland Kitchen ketoconazole (NIZORAL) 2 % cream Apply to rash bid for 3 weeks  . montelukast (SINGULAIR) 10 MG tablet Take 1 tablet (10 mg total) by mouth at bedtime.  Marland Kitchen OVER THE COUNTER MEDICATION Zinc Garlic Vit c  . oxyCODONE-acetaminophen (PERCOCET/ROXICET) 5-325 MG tablet One pill po tid prn to last 30 days.Cautioned drowsiness  . oxyCODONE-acetaminophen (PERCOCET/ROXICET) 5-325 MG tablet One pill po qid prn to last 30 days.Cautioned drowsiness.  Marland Kitchen oxyCODONE-acetaminophen (PERCOCET/ROXICET)  5-325 MG tablet One pill po qid prn to last 30 days.Cautioned drowsiness  . sertraline (ZOLOFT) 100 MG tablet Take one tablet po daily  . triamterene-hydrochlorothiazide (MAXZIDE-25) 37.5-25 MG tablet Take 1 tablet by mouth daily.  . vitamin E 100 UNIT capsule Take 100 Units by mouth daily.   No facility-administered encounter medications on file as of 12/24/2019.     Review of Systems  Constitutional: Negative.   Musculoskeletal:       Pending hip surgery. Denies significant pain as long as she isn't moving or sitting too long. Uses cane.  Psychiatric/Behavioral:       Experiencing a lot of stressors at home. Lives with daughter who is not the source of stress. 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     Vitals BP 138/82   Pulse 99   Temp (!) 96.2 F (35.7 C)   Ht 5\' 4"  (1.626 m)   Wt 182 lb 9.6 oz (82.8 kg)   SpO2 99%   BMI 31.34 kg/m   Objective:   Physical Exam Constitutional:      Appearance: Normal appearance.  Pulmonary:     Effort: Pulmonary effort is normal.  Skin:    General: Skin is warm and dry.  Neurological:     Mental Status: She is alert and oriented to person, place, and time.  Psychiatric:     Comments: Stressed with personal issues. Discussed at length.      Assessment and Plan   1. Elevated blood pressure reading with diagnosis  of hypertension  2. Stress at home    Stephanie [name] presents today to discuss blood pressure and postponing her hip surgery. She is experiencing significant stressors at home with getting work completed on her house.  Blood pressure in office today  Left arm: 126/88 Right arm: 136/80  Dr. Lilyan Punt recently added amlodipine 5 mg daily to her regimen on 12/16/19. Will not adjust medications today- awaiting home BP log and for her to perform some stress management strategies at home.  1) She has decided to postpone hip surgery until stressors are under better control, 2) She agrees to keep a blood pressure  log and bring it with her on her next visit in 3 weeks. 3) She will do chair yoga on You Tube (information given) 4) She will continue to meditate and do her exercises that PT gave her.  She agrees with this plan of care and understands warning signs.   Follow-up in 3 weeks, sooner if needed.  Novella Olive, NP 12/24/2019

## 2019-12-24 NOTE — Patient Instructions (Signed)
Go on YouTube and find: Gentle Yoga for Beginners and Seniors, with Prince Rome  Postpone your upcoming hip surgery until your stressors are under better control.  Keep the Blood pressure log below and bring back to your next visit.  Continue your meditation and exercises that you are already doing.  Not going to change your blood pressure medicine today.        Blood Pressure Record Sheet To take your blood pressure, you will need a blood pressure machine. You can buy a blood pressure machine (blood pressure monitor) at your clinic, drug store, or online. When choosing one, consider:  An automatic monitor that has an arm cuff.  A cuff that wraps snugly around your upper arm. You should be able to fit only one finger between your arm and the cuff.  A device that stores blood pressure reading results.  Do not choose a monitor that measures your blood pressure from your wrist or finger. Follow your health care provider's instructions for how to take your blood pressure. To use this form:  Get one reading in the morning (a.m.) before you take any medicines.  Get one reading in the evening (p.m.) before supper.  Take at least 2 readings with each blood pressure check. This makes sure the results are correct. Wait 1-2 minutes between measurements.  Write down the results in the spaces on this form.  Repeat this once a week, or as told by your health care provider.  Make a follow-up appointment with your health care provider to discuss the results. Blood pressure log Date: _______________________  a.m. _____________________(1st reading) _____________________(2nd reading)  p.m. _____________________(1st reading) _____________________(2nd reading) Date: _______________________  a.m. _____________________(1st reading) _____________________(2nd reading)  p.m. _____________________(1st reading) _____________________(2nd reading) Date: _______________________  a.m.  _____________________(1st reading) _____________________(2nd reading)  p.m. _____________________(1st reading) _____________________(2nd reading) Date: _______________________  a.m. _____________________(1st reading) _____________________(2nd reading)  p.m. _____________________(1st reading) _____________________(2nd reading) Date: _______________________  a.m. _____________________(1st reading) _____________________(2nd reading)  p.m. _____________________(1st reading) _____________________(2nd reading) This information is not intended to replace advice given to you by your health care provider. Make sure you discuss any questions you have with your health care provider. Document Revised: 06/28/2017 Document Reviewed: 04/30/2017 Elsevier Patient Education  2020 Else[employer]. Mindfulness-Based Stress Reduction Mindfulness-based stress reduction (MBSR) is a program that helps people learn to practice mindfulness. Mindfulness is the practice of intentionally paying attention to the present moment. It can be learned and practiced through techniques such as education, breathing exercises, meditation, and yoga. MBSR includes several mindfulness techniques in one program. MBSR works best when you understand the treatment, are willing to try new things, and can commit to spending time practicing what you learn. MBSR training may include learning about:  How your emotions, thoughts, and reactions affect your body.  New ways to respond to things that cause negative thoughts to start (triggers).  How to notice your thoughts and let go of them.  Practicing awareness of everyday things that you normally do without thinking.  The techniques and goals of different types of meditation. What are the benefits of MBSR? MBSR can have many benefits, which include helping you to:  Develop self-awareness. This refers to knowing and understanding yourself.  Learn skills and attitudes that help you to  participate in your own health care.  Learn new ways to care for yourself.  Be more accepting about how things are, and let things go.  Be less judgmental and approach things with an open mind.  Be patient with yourself  and trust yourself more. MBSR has also been shown to:  Reduce negative emotions, such as depression and anxiety.  Improve memory and focus.  Change how you sense and approach pain.  Boost your body's ability to fight infections.  Help you connect better with other people.  Improve your sense of well-being. Follow these instructions at home:   Find a local in-person or online MBSR program.  Set aside some time regularly for mindfulness practice.  Find a mindfulness practice that works best for you. This may include one or more of the following: ? Meditation. Meditation involves focusing your mind on a certain thought or activity. ? Breathing awareness exercises. These help you to stay present by focusing on your breath. ? Body scan. For this practice, you lie down and pay attention to each part of your body from head to toe. You can identify tension and soreness and intentionally relax parts of your body. ? Yoga. Yoga involves stretching and breathing, and it can improve your ability to move and be flexible. It can also provide an experience of testing your body's limits, which can help you release stress. ? Mindful eating. This way of eating involves focusing on the taste, texture, color, and smell of each bite of food. Because this slows down eating and helps you feel full sooner, it can be an important part of a weight-loss plan.  Find a podcast or recording that provides guidance for breathing awareness, body scan, or meditation exercises. You can listen to these any time when you have a free moment to rest without distractions.  Follow your treatment plan as told by your health care provider. This may include taking regular medicines and making changes to  your diet or lifestyle as recommended. How to practice mindfulness To do a basic awareness exercise:  Find a comfortable place to sit.  Pay attention to the present moment. Observe your thoughts, feelings, and surroundings just as they are.  Avoid placing judgment on yourself, your feelings, or your surroundings. Make note of any judgment that comes up, and let it go.  Your mind may wander, and that is okay. Make note of when your thoughts drift, and return your attention to the present moment. To do basic mindfulness meditation:  Find a comfortable place to sit. This may include a stable chair or a firm floor cushion. ? Sit upright with your back straight. Let your arms fall next to your side with your hands resting on your legs. ? If sitting in a chair, rest your feet flat on the floor. ? If sitting on a cushion, cross your legs in front of you.  Keep your head in a neutral position with your chin dropped slightly. Relax your jaw and rest the tip of your tongue on the roof of your mouth. Drop your gaze to the floor. You can close your eyes if you like.  Breathe normally and pay attention to your breath. Feel the air moving in and out of your nose. Feel your belly expanding and relaxing with each breath.  Your mind may wander, and that is okay. Make note of when your thoughts drift, and return your attention to your breath.  Avoid placing judgment on yourself, your feelings, or your surroundings. Make note of any judgment or feelings that come up, let them go, and bring your attention back to your breath.  When you are ready, lift your gaze or open your eyes. Pay attention to how your body feels after the meditation.  Where to find more information You can find more information about MBSR from:  Your health care provider.  Community-based meditation centers or programs.  Programs offered near you. Summary  Mindfulness-based stress reduction (MBSR) is a program that teaches you  how to intentionally pay attention to the present moment. It is used with other treatments to help you cope better with daily stress, emotions, and pain.  MBSR focuses on developing self-awareness, which allows you to respond to life stress without judgment or negative emotions.  MBSR programs may involve learning different mindfulness practices, such as breathing exercises, meditation, yoga, body scan, or mindful eating. Find a mindfulness practice that works best for you, and set aside time for it on a regular basis. This information is not intended to replace advice given to you by your health care provider. Make sure you discuss any questions you have with your health care provider. Document Revised: 04/12/2017 Document Reviewed: 09/06/2016 Elsevier Patient Education  2020 [employer].

## 2019-12-25 ENCOUNTER — Encounter: Payer: Self-pay | Admitting: Family Medicine

## 2019-12-28 ENCOUNTER — Telehealth: Payer: Self-pay | Admitting: Family Medicine

## 2019-12-28 NOTE — Telephone Encounter (Signed)
FYI only,  [employer] calling to let us know that patient's insurance has denied the order for a light weight wheel chair due to not meeting medical necessity.  They are canceling the order for now and the patient can reapply but she will not qualify for the light weight wheelchair, will have to be standard one.

## 2019-12-29 NOTE — Telephone Encounter (Signed)
Nurses Please relayed to the patient that her request for a light weight wheelchair was denied by her insurance If she would like a prescription for a standard wheelchair that can be done otherwise she will need to use her walker

## 2019-12-29 NOTE — Telephone Encounter (Signed)
Pt does want a standard wheel chair. Script written and await signature and then will fax

## 2019-12-30 NOTE — Telephone Encounter (Signed)
Script and ntoes faxed to [facility]. Fax number given to me by shannon at [employer] [facility]. Fax number [phone].

## 2020-01-05 ENCOUNTER — Ambulatory Visit: Payer: Medicare HMO | Admitting: Family Medicine

## 2020-01-05 ENCOUNTER — Encounter (HOSPITAL_COMMUNITY): Payer: BLUE CROSS/BLUE SHIELD

## 2020-01-07 ENCOUNTER — Other Ambulatory Visit: Payer: Self-pay | Admitting: *Deleted

## 2020-01-07 MED ORDER — EPINEPHRINE 0.3 MG/0.3ML IJ SOAJ: 0.3000 mg | Freq: Once | INTRAMUSCULAR | 1 refills | Status: DC | PRN

## 2020-01-08 ENCOUNTER — Telehealth: Payer: Self-pay | Admitting: Family Medicine

## 2020-01-08 NOTE — Telephone Encounter (Signed)
The phone number patient left for [employer] is requiring a 7 digit pin number to connect the call. Left message to return call to notify patient.

## 2020-01-08 NOTE — Telephone Encounter (Signed)
Dr Lorin Picket put in request for a Wheel Chair and Roller walker with seat and wheels.Call  [facility] [phone] (phone) to call in the request for authorization in order for it to be paid.   Pt call back [phone]

## 2020-01-11 NOTE — Telephone Encounter (Signed)
Patient states that [employer] [facility] told her that her insurance will not pay for wheelchair without physical therapy and we would have to call the insurance directly to bypass the physical therapy requirement(medically not able to complete physical therapy) please advise  Patient has follow scheduled 01/14/20 Patient states she is going back to [employer] [facility] today and try to see what is needed for her to get the wheelchair paid for and she will let us know what is needed

## 2020-01-12 NOTE — Telephone Encounter (Signed)
In this situation it is very reasonable for the patient to do physical therapy evaluation for wheelchair we will discuss this with her when she comes on 2 September

## 2020-01-13 ENCOUNTER — Ambulatory Visit: Admit: 2020-01-13 | Payer: BLUE CROSS/BLUE SHIELD | Admitting: Orthopedic Surgery

## 2020-01-13 SURGERY — ARTHROPLASTY, HIP, TOTAL, ANTERIOR APPROACH
Anesthesia: Choice | Site: Hip | Laterality: Left

## 2020-01-14 ENCOUNTER — Other Ambulatory Visit: Payer: Self-pay

## 2020-01-14 ENCOUNTER — Ambulatory Visit (INDEPENDENT_AMBULATORY_CARE_PROVIDER_SITE_OTHER): Payer: Medicare HMO | Admitting: Family Medicine

## 2020-01-14 VITALS — BP 132/82 | Temp 97.4°F | Wt 184.4 lb

## 2020-01-14 DIAGNOSIS — E785 Hyperlipidemia, unspecified: Secondary | ICD-10-CM

## 2020-01-14 DIAGNOSIS — M160 Bilateral primary osteoarthritis of hip: Secondary | ICD-10-CM | POA: Diagnosis not present

## 2020-01-14 DIAGNOSIS — I10 Essential (primary) hypertension: Secondary | ICD-10-CM | POA: Diagnosis not present

## 2020-01-14 DIAGNOSIS — Z79891 Long term (current) use of opiate analgesic: Secondary | ICD-10-CM | POA: Diagnosis not present

## 2020-01-14 DIAGNOSIS — Z79899 Other long term (current) drug therapy: Secondary | ICD-10-CM

## 2020-01-14 MED ORDER — OXYCODONE-ACETAMINOPHEN 5-325 MG PO TABS: ORAL_TABLET | ORAL | 0 refills | Status: DC

## 2020-01-14 NOTE — Progress Notes (Signed)
   Subjective:    Patient ID: Stephanie [name], female    DOB: 02/04/1961, 59 y.o.   MRN: [id number]  HPI  Patient arrives for a follow up on blood pressure. Patient states she has been doing pretty good- just ordered a new BP cuff because she doesn't think her current one is accurate. The patient does have underlying hypertension Her blood pressure cuff is a wrist cuff she is in the process of getting one that goes on the bicep She also states that she is doing a good job watching her diet.  The pain in her hips is doing much better than it used to so therefore she is not having as much pain and therefore is having much improved control and not have as much utilize medication. Review of Systems  Constitutional: Negative for activity change, appetite change and fatigue.  HENT: Negative for congestion and rhinorrhea.   Respiratory: Negative for cough and shortness of breath.   Cardiovascular: Negative for chest pain and leg swelling.  Gastrointestinal: Negative for abdominal pain and diarrhea.  Endocrine: Negative for polydipsia and polyphagia.  Skin: Negative for color change.  Neurological: Negative for dizziness and weakness.  Psychiatric/Behavioral: Negative for behavioral problems and confusion.       Objective:   Physical Exam Vitals reviewed.  Constitutional:      General: She is not in acute distress. HENT:     Head: Normocephalic and atraumatic.  Eyes:     General:        Right eye: No discharge.        Left eye: No discharge.  Neck:     Trachea: No tracheal deviation.  Cardiovascular:     Rate and Rhythm: Normal rate and regular rhythm.     Heart sounds: Normal heart sounds. No murmur heard.   Pulmonary:     Effort: Pulmonary effort is normal. No respiratory distress.     Breath sounds: Normal breath sounds.  Lymphadenopathy:     Cervical: No cervical adenopathy.  Skin:    General: Skin is warm and dry.  Neurological:     Mental Status: She is alert.      Coordination: Coordination normal.  Psychiatric:        Behavior: Behavior normal.           Assessment & Plan:  Bilateral hip osteoarthritis with previous surgery upcoming surgery is delayed till later this year Pain is under very good control not have any utilize much medication no more than 3/day New prescription was sent in for the patient Patient will follow-up within 3 months She is utilizing her sister's wheelchair so therefore she does not need one sent in by [place] She is due for lab work she will do metabolic 7 lipid liver before next visit

## 2020-01-20 DIAGNOSIS — M1612 Unilateral primary osteoarthritis, left hip: Secondary | ICD-10-CM | POA: Diagnosis not present

## 2020-02-19 DIAGNOSIS — M1612 Unilateral primary osteoarthritis, left hip: Secondary | ICD-10-CM | POA: Diagnosis not present

## 2020-03-01 ENCOUNTER — Other Ambulatory Visit: Payer: Self-pay

## 2020-03-01 ENCOUNTER — Ambulatory Visit: Payer: Medicare HMO | Admitting: Allergy and Immunology

## 2020-03-01 VITALS — BP 148/98 | HR 93 | Temp 98.3°F | Resp 16 | Ht 64.0 in | Wt 186.8 lb

## 2020-03-01 DIAGNOSIS — J4520 Mild intermittent asthma, uncomplicated: Secondary | ICD-10-CM | POA: Diagnosis not present

## 2020-03-01 DIAGNOSIS — H1010 Acute atopic conjunctivitis, unspecified eye: Secondary | ICD-10-CM

## 2020-03-01 DIAGNOSIS — Z886 Allergy status to analgesic agent status: Secondary | ICD-10-CM

## 2020-03-01 DIAGNOSIS — K219 Gastro-esophageal reflux disease without esophagitis: Secondary | ICD-10-CM | POA: Diagnosis not present

## 2020-03-01 DIAGNOSIS — J4540 Moderate persistent asthma, uncomplicated: Secondary | ICD-10-CM | POA: Diagnosis not present

## 2020-03-01 DIAGNOSIS — J309 Allergic rhinitis, unspecified: Secondary | ICD-10-CM | POA: Diagnosis not present

## 2020-03-01 MED ORDER — VENTOLIN HFA 108 (90 BASE) MCG/ACT IN AERS
2.0000 | INHALATION_SPRAY | RESPIRATORY_TRACT | 1 refills | Status: DC | PRN
Start: 2020-03-01 — End: 2020-10-18

## 2020-03-01 MED ORDER — ALBUTEROL SULFATE (2.5 MG/3ML) 0.083% IN NEBU
2.5000 mg | INHALATION_SOLUTION | RESPIRATORY_TRACT | 5 refills | Status: DC | PRN
Start: 2020-03-01 — End: 2020-10-06

## 2020-03-01 MED ORDER — BROVANA 15 MCG/2ML IN NEBU: 15.0000 ug | INHALATION_SOLUTION | Freq: Two times a day (BID) | RESPIRATORY_TRACT | 5 refills | Status: DC

## 2020-03-01 MED ORDER — OMEPRAZOLE 40 MG PO CPDR
40.0000 mg | DELAYED_RELEASE_CAPSULE | Freq: Two times a day (BID) | ORAL | 5 refills | Status: DC
Start: 2020-03-01 — End: 2020-10-06

## 2020-03-01 MED ORDER — BUDESONIDE 0.5 MG/2ML IN SUSP
0.5000 mg | Freq: Two times a day (BID) | RESPIRATORY_TRACT | 5 refills | Status: DC
Start: 2020-03-01 — End: 2020-10-06

## 2020-03-01 MED ORDER — MONTELUKAST SODIUM 10 MG PO TABS: 10.0000 mg | ORAL_TABLET | Freq: Every day | ORAL | 5 refills | Status: DC

## 2020-03-01 NOTE — Patient Instructions (Addendum)
  1. Treat inflammation:   A.  Budesonide 0.5 mg -nebulize twice a day  B.  Brovana -nebulize twice a day  C.  Nasacort - 1-2 sprays each nostril 1-7 times per week  2. Continue to Treat reflux:   A.  Omeprazole 40mg  1 tablet two times per day  3. Continue Albuterol HFA or albuterol nebulization 2 puffs every 4-6 hours if needed  4. Continue OTC antihistamine and Epi-Pen if needed  5. Obtain COVID booster vaccine and fall flu vaccine  6. Return to clinic in 4 weeks or earlier if problem

## 2020-03-01 NOTE — Progress Notes (Signed)
[place] - [place] - [place] - [name] - [place]   Follow-up Note  Referring Provider: Babs Sciara, MD Primary Provider: Babs Sciara, MD Date of Office Visit: 03/01/2020  Subjective:   Stephanie [name] (DOB: 09-09-1960) is a 59 y.o. female who [facility] in re-evaluation of the following:  HPI: Stephanie [name] returns to this clinic in evaluation of severe asthma, allergic rhinitis, GERD, and nonsteroidal anti-inflammatory drug hypersensitivity state.  Her last visit to this clinic was 01 September 2019.  She states that she gets extremely short of breath when she exerts herself to any degree.  She has been using her short acting bronchodilator 4 times per day.  She is not really sure that this helps her to any degree.  She does continue to use Symbicort but less than 1 time per day because of an expense issue.  Apparently her insurance company had informed her that it would be cheaper to use nebulized medications under her current insurance plan.  It does not sound as though she has required a systemic steroid to treat an exacerbation of asthma since her last visit.  She is very limited in her ability to perform any physical activity because of her left hip situation.  Her nose has been doing well while intermittently using some nasal steroid.  Her reflux is under very good control at this point in time.  She was able to discontinue her famotidine in the evening and still do well.  She remains away from consumption of nonsteroidal anti-inflammatory drugs.  She has had 2 Pfizer Covid vaccinations.  She had her pending left hip replacement surgery canceled because of a hypertensive issue which she is attempting to get under good control at this point in time with her primary care doctor.  It sounds as though she will have her left hip replaced somewhere towards the tail end of 2021 or early 2022.  Allergies as of 03/01/2020      Reactions    Amoxicillin Rash   fash swelled up and itching 04/2018   Aspirin Anaphylaxis   Hydrocodone Itching      Latex Hives, Itching   Levaquin [levofloxacin]    Rash on chest no hives 04/2018      Medication List      albuterol 108 (90 Base) MCG/ACT inhaler Commonly known as: Proventil HFA Inhale 2 puffs into the lungs every 4 (four) hours as needed for wheezing or shortness of breath.   amLODipine 5 MG tablet Commonly known as: NORVASC Take 1 tablet (5 mg total) by mouth daily.   budesonide-formoterol 160-4.5 MCG/ACT inhaler Commonly known as: Symbicort Inhale 2 puffs into the lungs 2 (two) times daily.   citalopram 20 MG tablet Commonly known as: CELEXA citalopram 20 mg tablet  TAKE 1 2 (ONE HALF) TABLET BY MOUTH IN THE MORNING FOR 7 DAYS THEN TAKE 1 TABLET IN THE MORNING.   EPINEPHrine 0.3 mg/0.3 mL Soaj injection Commonly known as: EpiPen 2-Pak Inject 0.3 mLs (0.3 mg total) into the muscle once as needed (allergic reaction).   ketoconazole 2 % cream Commonly known as: NIZORAL Apply to rash bid for 3 weeks   montelukast 10 MG tablet Commonly known as: SINGULAIR Take 1 tablet (10 mg total) by mouth at bedtime.   OVER THE COUNTER MEDICATION Zinc Garlic Vit c   oxyCODONE-acetaminophen 5-325 MG tablet Commonly known as: PERCOCET/ROXICET One pill po qid prn to last 30 days.Cautioned drowsiness.   oxyCODONE-acetaminophen  5-325 MG tablet Commonly known as: PERCOCET/ROXICET One pill po qid prn to last 30 days.Cautioned drowsiness   oxyCODONE-acetaminophen 5-325 MG tablet Commonly known as: PERCOCET/ROXICET One pill po tid prn to last 30 days.Cautioned drowsiness   sertraline 100 MG tablet Commonly known as: ZOLOFT Take one tablet po daily   triamterene-hydrochlorothiazide 37.5-25 MG tablet Commonly known as: MAXZIDE-25 Take 1 tablet by mouth daily.   vitamin C 100 MG tablet Take 100 mg by mouth daily.   vitamin E 45 MG (100 UNITS) capsule Take 100 Units by  mouth daily.       Past Medical History:  Diagnosis Date  . Allergic rhinitis   . Arthritis   . Asthma   . Depression   . Heart murmur   . Hypertension   . Pneumonia    hx of x 3     Past Surgical History:  Procedure Laterality Date  . CESAREAN SECTION  1985, 1989, 1995  . TONSILLECTOMY    . TOTAL HIP ARTHROPLASTY Right 03/27/2017   Procedure: RIGHT TOTAL HIP ARTHROPLASTY ANTERIOR APPROACH;  Surgeon: Ollen Gross, MD;  Location: [facility];  Service: Orthopedics;  Laterality: Right;    Review of systems negative except as noted in HPI / PMHx or noted below:  Review of Systems  Constitutional: Negative.   HENT: Negative.   Eyes: Negative.   Respiratory: Negative.   Cardiovascular: Negative.   Gastrointestinal: Negative.   Genitourinary: Negative.   Musculoskeletal: Negative.   Skin: Negative.   Neurological: Negative.   Endo/Heme/Allergies: Negative.   Psychiatric/Behavioral: Negative.      Objective:   Vitals:   03/01/20 0917 03/01/20 0936  BP: (!) 170/102 (!) 148/98  Pulse: 93   Resp: 16   Temp: 98.3 F (36.8 C)   SpO2: 97%    Height: 5\' 4"  (162.6 cm)  Weight: 186 lb 12.8 oz (84.7 kg)   Physical Exam Constitutional:      Appearance: She is not diaphoretic.  HENT:     Head: Normocephalic.     Right Ear: Tympanic membrane, ear canal and external ear normal.     Left Ear: Tympanic membrane, ear canal and external ear normal.     Nose: Nose normal. No mucosal edema or rhinorrhea.     Mouth/Throat:     Pharynx: Uvula midline. No oropharyngeal exudate.  Eyes:     Conjunctiva/sclera: Conjunctivae normal.  Neck:     Thyroid: No thyromegaly.     Trachea: Trachea normal. No tracheal tenderness or tracheal deviation.  Cardiovascular:     Rate and Rhythm: Normal rate and regular rhythm.     Heart sounds: Normal heart sounds, S1 normal and S2 normal. No murmur (Systolic) heard.   Pulmonary:     Effort: No respiratory distress.     Breath sounds: Normal  breath sounds. No stridor. No wheezing or rales.  Lymphadenopathy:     Head:     Right side of head: No tonsillar adenopathy.     Left side of head: No tonsillar adenopathy.     Cervical: No cervical adenopathy.  Skin:    Findings: No erythema or rash.     Nails: There is no clubbing.  Neurological:     Mental Status: She is alert.     Diagnostics:    Spirometry was performed and demonstrated an FEV1 of 1.79 at 83 % of predicted.  The patient had an Asthma Control Test with the following results: ACT Total Score: 5.    Assessment  and Plan:   1. Not well controlled moderate persistent asthma   2. Allergic rhinoconjunctivitis   3. Allergy to NSAIDs   4. Gastroesophageal reflux disease, unspecified whether esophagitis present     1. Treat inflammation:   A.  Budesonide 0.5 mg -nebulize twice a day  B.  Brovana -nebulize twice a day  C.  Nasacort - 1-2 sprays each nostril 1-7 times per week  2. Continue to Treat reflux:   A.  Omeprazole 40mg  1 tablet two times per day  3. Continue Albuterol HFA or albuterol nebulization 2 puffs every 4-6 hours if needed  4. Continue OTC antihistamine and Epi-Pen if needed  5. Obtain COVID booster vaccine and fall flu vaccine  6. Return to clinic in 4 weeks or earlier if problem  Tiffiany will utilize nebulized medications to control her respiratory tract inflammation as her insurance company has informed her that this is a cheaper route to go regarding the use of controller agents for asthma.  She has a very difficult time paying for his Symbicort or any type of controller agents on her current insurance plan.  She will continue on therapy for reflux as noted above.  Because we made a change in her therapy I will see her back in this clinic in 4 weeks.  Nelva Bush, MD Allergy / Immunology [facility]

## 2020-03-02 ENCOUNTER — Encounter: Payer: Self-pay | Admitting: Allergy and Immunology

## 2020-03-04 ENCOUNTER — Telehealth: Payer: Self-pay | Admitting: *Deleted

## 2020-03-04 NOTE — Telephone Encounter (Signed)
PA has been submitted through CoverMyMeds for Brovana and is currently pending approval or denial.

## 2020-03-07 ENCOUNTER — Ambulatory Visit (INDEPENDENT_AMBULATORY_CARE_PROVIDER_SITE_OTHER): Payer: Medicare HMO | Admitting: Family Medicine

## 2020-03-07 ENCOUNTER — Encounter: Payer: Self-pay | Admitting: Family Medicine

## 2020-03-07 ENCOUNTER — Other Ambulatory Visit: Payer: Self-pay

## 2020-03-07 VITALS — BP 132/90 | HR 80 | Temp 95.0°F | Ht 64.0 in | Wt 189.2 lb

## 2020-03-07 DIAGNOSIS — N950 Postmenopausal bleeding: Secondary | ICD-10-CM

## 2020-03-07 DIAGNOSIS — Z23 Encounter for immunization: Secondary | ICD-10-CM | POA: Diagnosis not present

## 2020-03-07 DIAGNOSIS — F324 Major depressive disorder, single episode, in partial remission: Secondary | ICD-10-CM | POA: Diagnosis not present

## 2020-03-07 DIAGNOSIS — Z0000 Encounter for general adult medical examination without abnormal findings: Secondary | ICD-10-CM

## 2020-03-07 DIAGNOSIS — Z113 Encounter for screening for infections with a predominantly sexual mode of transmission: Secondary | ICD-10-CM

## 2020-03-07 DIAGNOSIS — E785 Hyperlipidemia, unspecified: Secondary | ICD-10-CM | POA: Diagnosis not present

## 2020-03-07 DIAGNOSIS — I10 Essential (primary) hypertension: Secondary | ICD-10-CM

## 2020-03-07 DIAGNOSIS — Z1211 Encounter for screening for malignant neoplasm of colon: Secondary | ICD-10-CM | POA: Diagnosis not present

## 2020-03-07 DIAGNOSIS — Z79899 Other long term (current) drug therapy: Secondary | ICD-10-CM | POA: Diagnosis not present

## 2020-03-07 MED ORDER — TETANUS-DIPHTH-ACELL PERTUSSIS 5-2.5-18.5 LF-MCG/0.5 IM SUSP: 0.5000 mL | Freq: Once | INTRAMUSCULAR | 0 refills | Status: AC

## 2020-03-07 MED ORDER — FORMOTEROL FUMARATE 20 MCG/2ML IN NEBU: 20.0000 ug | INHALATION_SOLUTION | Freq: Two times a day (BID) | RESPIRATORY_TRACT | 5 refills | Status: DC

## 2020-03-07 MED ORDER — SERTRALINE HCL 100 MG PO TABS: ORAL_TABLET | ORAL | 1 refills | Status: DC

## 2020-03-07 MED ORDER — AMLODIPINE BESYLATE 5 MG PO TABS: 5.0000 mg | ORAL_TABLET | Freq: Every day | ORAL | 1 refills | Status: DC

## 2020-03-07 MED ORDER — TRIAMTERENE-HCTZ 37.5-25 MG PO TABS: 1.0000 | ORAL_TABLET | Freq: Every day | ORAL | 1 refills | Status: DC

## 2020-03-07 MED ORDER — SHINGRIX 50 MCG/0.5ML IM SUSR: 0.5000 mL | Freq: Once | INTRAMUSCULAR | 2 refills | Status: AC

## 2020-03-07 NOTE — Addendum Note (Signed)
Addended by: Dub Mikes on: 03/07/2020 01:43 PM   Modules accepted: Orders

## 2020-03-07 NOTE — Telephone Encounter (Signed)
PA was denied for [facility] stating that the patient must try and fail Perforomist first. Please advise.

## 2020-03-07 NOTE — Telephone Encounter (Signed)
Perforomist  would be fine

## 2020-03-07 NOTE — Progress Notes (Signed)
Patient ID: Stephanie [name], female    DOB: Nov 03, 1960, 59 y.o.   MRN: [id number]   Chief Complaint  Patient presents with  . Annual Exam   Subjective:  CC: wellness  Here today for her annual wellness.  Labs were ordered on January 14, 2020, she was got today on her way out to have those drawn.  She is hoping to get surgical clearance to have her left hip replacement, she will be having this done by Dr. Lequita Halt.  She is complaining of some vaginal/pelvic pain, she is postmenopausal, and has had 3-4 episodes of vaginal spotting this past year, as well as post intercourse spotting x1.  She sees an allergist, Dr. Lucie Leather, who manages several of her medications.     Medical History Gennifer has a past medical history of Allergic rhinitis, Arthritis, Asthma, Depression, Heart murmur, Hypertension, and Pneumonia.   Outpatient Encounter Medications as of 03/07/2020  Medication Sig  . albuterol (PROVENTIL HFA) 108 (90 Base) MCG/ACT inhaler Inhale 2 puffs into the lungs every 4 (four) hours as needed for wheezing or shortness of breath.  Marland Kitchen albuterol (PROVENTIL) (2.5 MG/3ML) 0.083% nebulizer solution Take 3 mLs (2.5 mg total) by nebulization every 4 (four) hours as needed for wheezing or shortness of breath.  . Ascorbic Acid (VITAMIN C) 100 MG tablet Take 100 mg by mouth daily.  Marland Kitchen BROVANA 15 MCG/2ML NEBU Take 2 mLs (15 mcg total) by nebulization in the morning and at bedtime.  . budesonide (PULMICORT) 0.5 MG/2ML nebulizer solution Take 2 mLs (0.5 mg total) by nebulization in the morning and at bedtime.  . budesonide-formoterol (SYMBICORT) 160-4.5 MCG/ACT inhaler Inhale 2 puffs into the lungs 2 (two) times daily.  . citalopram (CELEXA) 20 MG tablet citalopram 20 mg tablet  TAKE 1 2 (ONE HALF) TABLET BY MOUTH IN THE MORNING FOR 7 DAYS THEN TAKE 1 TABLET IN THE MORNING.  Marland Kitchen EPINEPHrine (EPIPEN 2-PAK) 0.3 mg/0.3 mL IJ SOAJ injection Inject 0.3 mLs (0.3 mg total) into the muscle once as needed (allergic  reaction).  Marland Kitchen ketoconazole (NIZORAL) 2 % cream Apply to rash bid for 3 weeks  . montelukast (SINGULAIR) 10 MG tablet Take 1 tablet (10 mg total) by mouth at bedtime.  Marland Kitchen omeprazole (PRILOSEC) 40 MG capsule Take 1 capsule (40 mg total) by mouth in the morning and at bedtime.  Marland Kitchen OVER THE COUNTER MEDICATION Zinc Garlic Vit c  . oxyCODONE-acetaminophen (PERCOCET/ROXICET) 5-325 MG tablet One pill po qid prn to last 30 days.Cautioned drowsiness.  Marland Kitchen oxyCODONE-acetaminophen (PERCOCET/ROXICET) 5-325 MG tablet One pill po qid prn to last 30 days.Cautioned drowsiness  . oxyCODONE-acetaminophen (PERCOCET/ROXICET) 5-325 MG tablet One pill po tid prn to last 30 days.Cautioned drowsiness  . VENTOLIN HFA 108 (90 Base) MCG/ACT inhaler Inhale 2 puffs into the lungs every 4 (four) hours as needed for wheezing or shortness of breath.  . vitamin E 100 UNIT capsule Take 100 Units by mouth daily.  . [DISCONTINUED] amLODipine (NORVASC) 5 MG tablet Take 1 tablet (5 mg total) by mouth daily.  . [DISCONTINUED] sertraline (ZOLOFT) 100 MG tablet Take one tablet po daily  . [DISCONTINUED] triamterene-hydrochlorothiazide (MAXZIDE-25) 37.5-25 MG tablet Take 1 tablet by mouth daily.  Marland Kitchen amLODipine (NORVASC) 5 MG tablet Take 1 tablet (5 mg total) by mouth daily.  . sertraline (ZOLOFT) 100 MG tablet Take one tablet po daily  . Tdap (BOOSTRIX) 5-2.5-18.5 LF-MCG/0.5 injection Inject 0.5 mLs into the muscle once for 1 dose.  . triamterene-hydrochlorothiazide (MAXZIDE-25) 37.5-25 MG tablet  Take 1 tablet by mouth daily.  Marland Kitchen Zoster Vaccine Adjuvanted [facility]) injection Inject 0.5 mLs into the muscle once for 1 dose.   No facility-administered encounter medications on file as of 03/07/2020.     Review of Systems  Constitutional: Negative.   Eyes: Negative.   Gastrointestinal: Negative for abdominal pain.  Endocrine: Negative.   Genitourinary: Positive for pelvic pain.       Post-menopausal spotting 3-4 times this past year   Neurological: Positive for light-headedness.  Psychiatric/Behavioral: Negative.      Vitals BP 132/90   Pulse 80   Temp (!) 95 F (35 C)   Ht 5\' 4"  (1.626 m)   Wt 189 lb 3.2 oz (85.8 kg)   SpO2 96%   BMI 32.48 kg/m   Objective:   Physical Exam Vitals and nursing note reviewed. Exam conducted with a chaperone present.  Constitutional:      General: She is not in acute distress. HENT:     Nose: Nose normal.     Mouth/Throat:     Mouth: Mucous membranes are moist.     Pharynx: Oropharynx is clear.  Cardiovascular:     Rate and Rhythm: Normal rate.     Heart sounds: Murmur heard.   Pulmonary:     Effort: Pulmonary effort is normal.     Breath sounds: Normal breath sounds.  Chest:     Chest wall: No mass.     Breasts:        Right: Normal. No mass, nipple discharge, skin change or tenderness.        Left: Normal. No mass, nipple discharge, skin change or tenderness.  Abdominal:     Palpations: Abdomen is soft.  Genitourinary:    General: Normal vulva.     Vagina: Vaginal discharge present.     Comments: Creamy yellowish vaginal discharge, moderate amount. Musculoskeletal:     Comments: Limited ROM left hip. Awaiting hip replacement. Uses cane for safety and mobility.   Lymphadenopathy:     Upper Body:     Right upper body: No axillary adenopathy.     Left upper body: No axillary adenopathy.  Skin:    General: Skin is warm and dry.  Neurological:     Mental Status: She is alert and oriented to person, place, and time.  Psychiatric:        Mood and Affect: Mood normal.        Behavior: Behavior normal.        Thought Content: Thought content normal.        Judgment: Judgment normal.    External GU: no rashes or lesions. Vagina: moderate yellow discharge; tissue pink in color. Cervix: unable to see cervix due to discharge.  No CMT. Bimanual exam: no tenderness or obvious masses.  Reports vaginal/pelvic pain.    Assessment and Plan   1. Encounter for  annual wellness exam in Medicare patient  2. Screening for colon cancer - IFOBT POC (occult bld, rslt in office); Future  3. Need for vaccination - Zoster Vaccine Adjuvanted [facility]) injection; Inject 0.5 mLs into the muscle once for 1 dose.  Dispense: 0.5 mL; Refill: 2 - Tdap (BOOSTRIX) 5-2.5-18.5 LF-MCG/0.5 injection; Inject 0.5 mLs into the muscle once for 1 dose.  Dispense: 0.5 mL; Refill: 0 - Flu Vaccine QUAD 36+ mos IM  4. Screen for STD (sexually transmitted disease) - Chlamydia/Gonococcus/Trichomonas, NAA  5. Post-menopausal bleeding  6. Essential hypertension - amLODipine (NORVASC) 5 MG tablet; Take 1 tablet (5  mg total) by mouth daily.  Dispense: 90 tablet; Refill: 1 - triamterene-hydrochlorothiazide (MAXZIDE-25) 37.5-25 MG tablet; Take 1 tablet by mouth daily.  Dispense: 90 tablet; Refill: 1  7. Depression, major, single episode, in partial remission (HCC) - sertraline (ZOLOFT) 100 MG tablet; Take one tablet po daily  Dispense: 90 tablet; Refill: 1   Essential Hypertension:  Blood pressure recheck: 142/96. Has taken BP meds today. Last visit on 9/2 BP more controlled. Wants surgical clearance for hip replacement, will return in 2 weeks for BP check.  On September 2, her blood pressure was 132/82.  When she saw her allergist Dr. Sharyn Lull on October 19, her blood pressure was 148/98.  Her blood pressure is not well controlled at this time, therefore, we cannot authorize surgical clearance for her hip replacement.  Her lightheadedness is likely due to her uncontrolled blood pressure.  She will have labs drawn today, will make sure that her renal function is good, and the plan is to increase her amlodipine from 5 mg to 10 mg daily.  She wishes to get the shingles vaccine and she is due for her tetanus, prescription sent with her to get those at her local pharmacy.  Post-menopausal and post-coital spotting with pelvic/vaginal pain:   Due to post-menopausal and post-coital vaginal  spotting, will refer to GYN. Severe ROM issues with left leg affect ability to locate cervix today, and she had moderate amount of yellow vaginal discharge: will check urine for GC/Chlmydia/ Trich.  She also reports pain in the pelvic/vaginal area.  She will need to have her Pap smear done when she goes to her GYN appointment.   Depression:   Donovan really struggles with mobility, and pain from her left hip.  This takes her toll on her daily quality of life.  PHQ-9 score today was 11.  She is compliant with her sertraline, wishes to stay on her current regimen.  She looks forward to getting the hip surgery, and being in less pain daily.  She denies thoughts of self-harm.  Screening for Colon Cancer:  Although this was her annual wellness visit, she was due for her FOBT screening, which she does in place of colonoscopy.  Agrees with plan of care discussed today. Understands warning signs to seek further care: Chest pain, shortness of breath, headaches, uncontrolled blood pressure. Understands to follow-up in 2 weeks for blood pressure check, sooner if anything changes.  The plan is to check labs, increase amlodipine to 10 mg daily.  Novella Olive, NP 03/07/2020     AWV- Annual Wellness Visit  The patient was seen for their annual wellness visit. The patient's past medical history, surgical history, and family history were reviewed. Pertinent vaccines were reviewed ( tetanus, pneumonia, shingles, flu) The patient's medication list was reviewed and updated.  The height and weight were entered.  BMI recorded in electronic record elsewhere  Cognitive screening was completed. Outcome of Mini - Cog: pass   Falls /depression screening electronically recorded within record elsewhere  Current tobacco usage: none (All patients who use tobacco were given written and verbal information on quitting)  Recent listing of emergency department/hospitalizations over the past year were reviewed.   current specialist the patient sees on a regular basis: Dr.Kozlow    Medicare annual wellness visit patient questionnaire was reviewed.  A written screening schedule for the patient for the next 5-10 years was given. Appropriate discussion of followup regarding next visit was discussed.

## 2020-03-07 NOTE — Patient Instructions (Signed)
Postmenopausal Bleeding  Postmenopausal bleeding is any bleeding that occurs after menopause. Menopause is when a woman's period stops. Any type of bleeding after menopause should be checked by your doctor. Treatment will depend on the cause. Follow these instructions at home:  Pay attention to any changes in your symptoms.  Avoid using tampons and douches as told by your doctor.  Change your pads regularly.  Get regular pelvic exams and Pap tests.  Take iron pills as told by your doctor.  Take over-the-counter and prescription medicines only as told by your doctor.  Keep all follow-up visits as told by your doctor. This is important. Contact a doctor if:  Your bleeding lasts for more than 1 week.  You have pain in your belly (abdomen).  You have bleeding during or after sex.  You have bleeding that happens more often than every 3 weeks. Get help right away if:  You have fever, chills, headache, dizziness, muscle aches, or bleeding.  You have very bad pain with bleeding.  You have clumps of blood (blood clots) coming from your vagina.  You have a lot of bleeding, and: ? You use more than 1 pad an hour. ? This kind of bleeding has never happened before.  You feel like you are going to pass out (faint). Summary  Any type of bleeding after menopause should be checked by your doctor.  Pay attention to any changes in your symptoms.  Keep all follow-up visits as told by your doctor. This information is not intended to replace advice given to you by your health care provider. Make sure you discuss any questions you have with your health care provider. Document Revised: 07/17/2018 Document Reviewed: 06/05/2016 Elsevier Patient Education  2020 Elsevier Inc.  Stephanie [name] , Thank you for taking time to come for your Medicare Wellness Visit. I appreciate your ongoing commitment to your health goals. Please review the following plan we discussed and let me know if I can  assist you in the future.   These are the goals we discussed: Goals   None     This is a list of the screening recommended for you and due dates:  Health Maintenance  Topic Date Due  . Tetanus Vaccine  Never done  . Pap Smear  Never done  . Colon Cancer Screening  Never done  . Stool Blood Test  03/05/2020  . Mammogram  06/21/2021  . Flu Shot  Completed  . COVID-19 Vaccine  Completed  .  Hepatitis C: One time screening is recommended by Center for Disease Control  (CDC) for  adults born from [birth year] through 1964.   Completed  . HIV Screening  Completed

## 2020-03-07 NOTE — Telephone Encounter (Signed)
Called however the lady who answered informed me that patient is in an office visit with a doctor at the moment. I informed her to please have patient call when she can.

## 2020-03-07 NOTE — Telephone Encounter (Signed)
Patient called back and was informed of the medication change. Patient verbalized understanding.

## 2020-03-08 ENCOUNTER — Other Ambulatory Visit: Payer: Self-pay | Admitting: Family Medicine

## 2020-03-08 DIAGNOSIS — I10 Essential (primary) hypertension: Secondary | ICD-10-CM

## 2020-03-08 LAB — LIPID PANEL
Chol/HDL Ratio: 2.3 ratio (ref 0.0–4.4)
Cholesterol, Total: 192 mg/dL (ref 100–199)
HDL: 83 mg/dL (ref >39)
LDL Chol Calc (NIH): 85 mg/dL (ref 0–99)
Triglycerides: 145 mg/dL (ref 0–149)
VLDL Cholesterol Cal: 24 mg/dL (ref 5–40)

## 2020-03-08 LAB — BASIC METABOLIC PANEL
BUN/Creatinine Ratio: 13 (ref 9–23)
BUN: 8 mg/dL (ref 6–24)
CO2: 26 mmol/L (ref 20–29)
Calcium: 9.9 mg/dL (ref 8.7–10.2)
Chloride: 98 mmol/L (ref 96–106)
Creatinine, Ser: 0.64 mg/dL (ref 0.57–1.00)
GFR calc Af Amer: 114 mL/min/{1.73_m2} (ref >59)
GFR calc non Af Amer: 99 mL/min/{1.73_m2} (ref >59)
Glucose: 84 mg/dL (ref 65–99)
Potassium: 3.8 mmol/L (ref 3.5–5.2)
Sodium: 141 mmol/L (ref 134–144)

## 2020-03-08 LAB — HEPATIC FUNCTION PANEL
ALT: 19 IU/L (ref 0–32)
AST: 16 IU/L (ref 0–40)
Albumin: 4.9 g/dL (ref 3.8–4.9)
Alkaline Phosphatase: 94 IU/L (ref 44–121)
Bilirubin Total: 0.7 mg/dL (ref 0.0–1.2)
Bilirubin, Direct: 0.16 mg/dL (ref 0.00–0.40)
Total Protein: 7.7 g/dL (ref 6.0–8.5)

## 2020-03-08 MED ORDER — AMLODIPINE BESYLATE 10 MG PO TABS: 10.0000 mg | ORAL_TABLET | Freq: Every day | ORAL | 1 refills | Status: DC

## 2020-03-10 LAB — CHLAMYDIA/GONOCOCCUS/TRICHOMONAS, NAA
Chlamydia by NAA: NEGATIVE
Gonococcus by NAA: NEGATIVE
Trich vag by NAA: NEGATIVE

## 2020-03-21 ENCOUNTER — Encounter: Payer: Medicare HMO | Admitting: Family Medicine

## 2020-03-21 DIAGNOSIS — M1612 Unilateral primary osteoarthritis, left hip: Secondary | ICD-10-CM | POA: Diagnosis not present

## 2020-03-29 ENCOUNTER — Ambulatory Visit: Payer: Medicare HMO | Admitting: Allergy and Immunology

## 2020-03-30 ENCOUNTER — Other Ambulatory Visit: Payer: Self-pay

## 2020-03-30 ENCOUNTER — Ambulatory Visit (INDEPENDENT_AMBULATORY_CARE_PROVIDER_SITE_OTHER): Payer: Medicare HMO | Admitting: Family Medicine

## 2020-03-30 ENCOUNTER — Encounter: Payer: Self-pay | Admitting: Family Medicine

## 2020-03-30 ENCOUNTER — Telehealth: Payer: Self-pay | Admitting: Family Medicine

## 2020-03-30 VITALS — BP 130/78 | HR 93 | Temp 97.6°F | Ht 64.0 in | Wt 189.0 lb

## 2020-03-30 DIAGNOSIS — I10 Essential (primary) hypertension: Secondary | ICD-10-CM | POA: Diagnosis not present

## 2020-03-30 DIAGNOSIS — M160 Bilateral primary osteoarthritis of hip: Secondary | ICD-10-CM

## 2020-03-30 DIAGNOSIS — Z79891 Long term (current) use of opiate analgesic: Secondary | ICD-10-CM | POA: Diagnosis not present

## 2020-03-30 DIAGNOSIS — Z1211 Encounter for screening for malignant neoplasm of colon: Secondary | ICD-10-CM

## 2020-03-30 LAB — IFOBT (OCCULT BLOOD): IFOBT: NEGATIVE

## 2020-03-30 MED ORDER — OXYCODONE-ACETAMINOPHEN 5-325 MG PO TABS: ORAL_TABLET | ORAL | 0 refills | Status: DC

## 2020-03-30 NOTE — Patient Instructions (Signed)

## 2020-03-30 NOTE — Telephone Encounter (Signed)
Please tell pharmacist thank you for picking this up that was a typo A new corrected prescription was sent for that month for 1 taken 4 times daily 120

## 2020-03-30 NOTE — Progress Notes (Signed)
Subjective:    Patient ID: Stephanie [name], female    DOB: Aug 19, 1960, 59 y.o.   MRN: [id number]  HPI recheck bp.   Blood pressure check with her cuff check with our cuff her cuff reads way too high our cuff with Walt unit good reading overall.  Continue current measures  This patient was seen today for chronic pain  The medication list was reviewed and updated.   -Compliance with medication: She is compliant with the medicine  - Number patient states they take daily: Approximately 4-day  -when was the last dose patient took?  Earlier today  The patient was advised the importance of maintaining medication and not using illegal substances with these.  Here for refills and follow up  The patient was educated that we can provide 3 monthly scripts for their medication, it is their responsibility to follow the instructions.  Side effects or complications from medications: Does not cause drowsiness  Patient is aware that pain medications are meant to minimize the severity of the pain to allow their pain levels to improve to allow for better function. They are aware of that pain medications cannot totally remove their pain.  Due for UDT ( at least once per year) : Does this yearly  Scale of 1 to 10 ( 1 is least 10 is most) Your pain level without the medicine: 9 Your pain level with medication four  Scale 1 to 10 ( 1-helps very little, 10 helps very well) How well does your pain medication reduce your pain so you can function better through out the day?  7       Review of Systems  Constitutional: Negative for activity change and appetite change.  HENT: Negative for congestion and rhinorrhea.   Respiratory: Negative for cough and shortness of breath.   Cardiovascular: Negative for chest pain and leg swelling.  Gastrointestinal: Negative for abdominal pain, nausea and vomiting.  Musculoskeletal: Positive for arthralgias.       Severe hip pain  Skin: Negative for color change.   Neurological: Negative for dizziness and weakness.  Psychiatric/Behavioral: Negative for agitation and confusion.       Objective:   Physical Exam Vitals reviewed.  Constitutional:      General: She is not in acute distress. HENT:     Head: Normocephalic and atraumatic.  Eyes:     General:        Right eye: No discharge.        Left eye: No discharge.  Neck:     Trachea: No tracheal deviation.  Cardiovascular:     Rate and Rhythm: Normal rate and regular rhythm.     Heart sounds: Normal heart sounds. No murmur heard.   Pulmonary:     Effort: Pulmonary effort is normal. No respiratory distress.     Breath sounds: Normal breath sounds.  Lymphadenopathy:     Cervical: No cervical adenopathy.  Skin:    General: Skin is warm and dry.  Neurological:     Mental Status: She is alert.     Coordination: Coordination normal.  Psychiatric:        Behavior: Behavior normal.           Assessment & Plan:  1. Essential hypertension Blood pressure good control continue current measures watch diet follow-up in January before her surgery  2. Primary osteoarthritis of both hips Surgery at the end of January  3. Long-term current use of opiate analgesic Patient uses her medicine responsibly does not  abuse it drug registry checked Does allow her to function better without less pain. The patient was seen in followup for chronic pain. A review over at their current pain status was discussed. Drug registry was checked. Prescriptions were given.  Regular follow-up recommended. Discussion was held regarding the importance of compliance with medication as well as pain medication contract.  Patient was informed that medication may cause drowsiness and should not be combined  with other medications/alcohol or street drugs. If the patient feels medication is causing altered alertness then do not drive or operate dangerous equipment.   - oxyCODONE-acetaminophen (PERCOCET/ROXICET) 5-325  MG tablet; One pill po qid prn to last 30 days.Cautioned drowsiness.  Dispense: 120 tablet; Refill: 0 - oxyCODONE-acetaminophen (PERCOCET/ROXICET) 5-325 MG tablet; One pill po qid prn to last 30 days.Cautioned drowsiness  Dispense: 120 tablet; Refill: 0 - oxyCODONE-acetaminophen (PERCOCET/ROXICET) 5-325 MG tablet; One pill po tid prn to last 30 days.Cautioned drowsiness  Dispense: 90 tablet; Refill: 0

## 2020-03-30 NOTE — Addendum Note (Signed)
Addended by: Lilyan Punt A on: 03/30/2020 03:24 PM   Modules accepted: Orders

## 2020-03-30 NOTE — Telephone Encounter (Signed)
Amanda from [employer] calling in regards to pain scripts sent in today. The script to be fill in December says take one TID instead of one QID like the others. Pharmacist needing to know if this is a typo or are we decreasing pt pain meds. Please advise. Thank you

## 2020-04-01 DIAGNOSIS — J4520 Mild intermittent asthma, uncomplicated: Secondary | ICD-10-CM | POA: Diagnosis not present

## 2020-04-14 ENCOUNTER — Ambulatory Visit: Payer: BLUE CROSS/BLUE SHIELD | Attending: Internal Medicine

## 2020-04-14 DIAGNOSIS — Z23 Encounter for immunization: Secondary | ICD-10-CM

## 2020-04-14 NOTE — Progress Notes (Signed)
   Covid-19 Vaccination Clinic  Name:  Glorimar Stroope    MRN: [id number] DOB: 1960/05/29  04/14/2020  Ms. Eubanks was observed post Covid-19 immunization for 30 minutes based on pre-vaccination screening without incident. She was provided with Vaccine Information Sheet and instruction to access the V-Safe system.   Ms. Allbee was instructed to call 911 with any severe reactions post vaccine: Marland Kitchen Difficulty breathing  . Swelling of face and throat  . A fast heartbeat  . A bad rash all over body  . Dizziness and weakness   Immunizations Administered    No immunizations on file.

## 2020-04-19 ENCOUNTER — Encounter: Payer: Self-pay | Admitting: Allergy and Immunology

## 2020-04-19 ENCOUNTER — Ambulatory Visit (INDEPENDENT_AMBULATORY_CARE_PROVIDER_SITE_OTHER): Payer: Medicare HMO | Admitting: Allergy and Immunology

## 2020-04-19 ENCOUNTER — Other Ambulatory Visit: Payer: Self-pay

## 2020-04-19 VITALS — BP 164/104 | HR 97 | Resp 18

## 2020-04-19 DIAGNOSIS — Z886 Allergy status to analgesic agent status: Secondary | ICD-10-CM | POA: Diagnosis not present

## 2020-04-19 DIAGNOSIS — J309 Allergic rhinitis, unspecified: Secondary | ICD-10-CM

## 2020-04-19 DIAGNOSIS — K219 Gastro-esophageal reflux disease without esophagitis: Secondary | ICD-10-CM

## 2020-04-19 DIAGNOSIS — H1010 Acute atopic conjunctivitis, unspecified eye: Secondary | ICD-10-CM | POA: Diagnosis not present

## 2020-04-19 DIAGNOSIS — J4540 Moderate persistent asthma, uncomplicated: Secondary | ICD-10-CM

## 2020-04-19 NOTE — Progress Notes (Signed)
[place] [facility] [place] [facility] [place] [facility] [name] [facility] [place]   Follow-up Note  Referring Provider: Babs Sciara, MD Primary Provider: Babs Sciara, MD Date of Office Visit: 04/19/2020  Subjective:   Stephanie [name] (DOB: November 02, 1960) is a 59 y.o. female who [facility]2020 in re-evaluation of the following:  HPI: Stephanie [name] returns to this clinic in evaluation of severe asthma and allergic rhinitis and reflux and nonsteroidal anti-inflammatory drug induced anaphylaxis.  Her last visit to this clinic was 01 March 2020.  During her last visit she was having very significant dyspnea on exertion and required a bronchodilator 4 times per day.  A fair amount of the symptomatology and condition was secondary to the fact that she could not afford her Symbicort.  We switched her to a combination of nebulized budesonide and formoterol because this was much cheaper and she has done wonderful.  She has basically resolved her requirement for short acting bronchodilator and has very little dyspnea on exertion at this point.  She has not required a systemic steroid or an antibiotic since I have seen her in this clinic last.  She has had no issues with her nose.  She has had no issues with reflux while using a proton pump inhibitor twice a day.  She remains away from consumption of nonsteroidal anti-inflammatory drugs.  She has had 3 doses of Pfizer Covid vaccination and the flu vaccine this year.  Allergies as of 04/19/2020      Reactions   Amoxicillin Rash   fash swelled up and itching 04/2018   Aspirin Anaphylaxis   Hydrocodone Itching      Latex Hives, Itching   Levaquin [levofloxacin]    Rash on chest no hives 04/2018      Medication List    albuterol 108 (90 Base) MCG/ACT inhaler Commonly known as: Proventil HFA Inhale 2 puffs into the lungs every 4 (four) hours as needed for wheezing or shortness of breath.   Ventolin HFA 108 (90 Base)  MCG/ACT inhaler Generic drug: albuterol Inhale 2 puffs into the lungs every 4 (four) hours as needed for wheezing or shortness of breath.   albuterol (2.5 MG/3ML) 0.083% nebulizer solution Commonly known as: PROVENTIL Take 3 mLs (2.5 mg total) by nebulization every 4 (four) hours as needed for wheezing or shortness of breath.   amLODipine 10 MG tablet Commonly known as: NORVASC Take 1 tablet (10 mg total) by mouth daily.   Brovana 15 MCG/2ML Nebu Generic drug: arformoterol Take 2 mLs (15 mcg total) by nebulization in the morning and at bedtime.   budesonide 0.5 MG/2ML nebulizer solution Commonly known as: PULMICORT Take 2 mLs (0.5 mg total) by nebulization in the morning and at bedtime.   citalopram 20 MG tablet Commonly known as: CELEXA citalopram 20 mg tablet  TAKE 1 2 (ONE HALF) TABLET BY MOUTH IN THE MORNING FOR 7 DAYS THEN TAKE 1 TABLET IN THE MORNING.   EPINEPHrine 0.3 mg/0.3 mL Soaj injection Commonly known as: EpiPen 2-Pak Inject 0.3 mLs (0.3 mg total) into the muscle once as needed (allergic reaction).   formoterol 20 MCG/2ML nebulizer solution Commonly known as: Perforomist Take 2 mLs (20 mcg total) by nebulization 2 (two) times daily.   ketoconazole 2 % cream Commonly known as: NIZORAL Apply to rash bid for 3 weeks   montelukast 10 MG tablet Commonly known as: SINGULAIR Take 1 tablet (10 mg total) by mouth at bedtime.   omeprazole  40 MG capsule Commonly known as: PRILOSEC Take 1 capsule (40 mg total) by mouth in the morning and at bedtime.   OVER THE COUNTER MEDICATION Zinc Garlic Vit c   oxyCODONE-acetaminophen 5-325 MG tablet Commonly known as: PERCOCET/ROXICET One pill po qid prn to last 30 days.Cautioned drowsiness.   oxyCODONE-acetaminophen 5-325 MG tablet Commonly known as: PERCOCET/ROXICET One pill po qid prn to last 30 days.Cautioned drowsiness   oxyCODONE-acetaminophen 5-325 MG tablet Commonly known as: PERCOCET/ROXICET One pill po qid  prn to last 30 days.Cautioned drowsiness   sertraline 100 MG tablet Commonly known as: ZOLOFT Take one tablet po daily   triamterene-hydrochlorothiazide 37.5-25 MG tablet Commonly known as: MAXZIDE-25 Take 1 tablet by mouth daily.   vitamin C 100 MG tablet Take 100 mg by mouth daily.   vitamin E 45 MG (100 UNITS) capsule Take 100 Units by mouth daily.       Past Medical History:  Diagnosis Date  . Allergic rhinitis   . Arthritis   . Asthma   . Depression   . Heart murmur   . Hypertension   . Pneumonia    hx of x 3     Past Surgical History:  Procedure Laterality Date  . CESAREAN SECTION  1985, 1989, 1995  . TONSILLECTOMY    . TOTAL HIP ARTHROPLASTY Right 03/27/2017   Procedure: RIGHT TOTAL HIP ARTHROPLASTY ANTERIOR APPROACH;  Surgeon: Ollen Gross, MD;  Location: [facility];  Service: Orthopedics;  Laterality: Right;    Review of systems negative except as noted in HPI / PMHx or noted below:  Review of Systems  Constitutional: Negative.   HENT: Negative.   Eyes: Negative.   Respiratory: Negative.   Cardiovascular: Negative.   Gastrointestinal: Negative.   Genitourinary: Negative.   Musculoskeletal: Negative.   Skin: Negative.   Neurological: Negative.   Endo/Heme/Allergies: Negative.   Psychiatric/Behavioral: Negative.      Objective:   Vitals:   04/19/20 1152  BP: (!) 164/104  Pulse: 97  Resp: 18  SpO2: 97%          Physical Exam Constitutional:      Appearance: She is not diaphoretic.  HENT:     Head: Normocephalic.     Right Ear: Tympanic membrane, ear canal and external ear normal.     Left Ear: Tympanic membrane, ear canal and external ear normal.     Nose: Nose normal. No mucosal edema or rhinorrhea.     Mouth/Throat:     Pharynx: Uvula midline. No oropharyngeal exudate.  Eyes:     Conjunctiva/sclera: Conjunctivae normal.  Neck:     Thyroid: No thyromegaly.     Trachea: Trachea normal. No tracheal tenderness or tracheal deviation.   Cardiovascular:     Rate and Rhythm: Normal rate and regular rhythm.     Heart sounds: S1 normal and S2 normal. Murmur (Systolic) heard.   Pulmonary:     Effort: No respiratory distress.     Breath sounds: Normal breath sounds. No stridor. No wheezing or rales.  Lymphadenopathy:     Head:     Right side of head: No tonsillar adenopathy.     Left side of head: No tonsillar adenopathy.     Cervical: No cervical adenopathy.  Skin:    Findings: No erythema or rash.     Nails: There is no clubbing.  Neurological:     Mental Status: She is alert.     Diagnostics:    Spirometry was performed and demonstrated an  FEV1 of 1.70 at 79 % of predicted.  Assessment and Plan:   1. Asthma, moderate persistent, well-controlled   2. Allergic rhinoconjunctivitis   3. Gastroesophageal reflux disease, unspecified whether esophagitis present   4. Allergy to NSAIDs     1. Treat inflammation:   A.  Budesonide 0.5 mg -nebulize 1-2 times per day  B.  Brovana -nebulize 1-2 times per day  C.  Nasacort - 1-2 sprays each nostril 1-7 times per week  2. Continue to Treat reflux:   A.  Omeprazole 40mg  1 tablet two times per day  3. Continue Albuterol HFA or albuterol nebulization 2 puffs every 4-6 hours if needed  4. Continue OTC antihistamine and Epi-Pen if needed  5. Return to clinic in 6 months or earlier if problem  Melanni is really doing much better on her current therapy and we will have her utilize her budesonide and Brovana hopefully just 1 time per day at this point.  She can always go back up to twice a day should she find that 1 time per day administration is inadequate in controlling her lung condition.  She will remain on anti-inflammatory agents for upper airway and therapy directed against reflux as well as noted above.  I will see her back in this clinic in 6 months or earlier if there is a problem.  Nelva Bush, MD Allergy / Immunology  [facility]

## 2020-04-19 NOTE — Patient Instructions (Signed)
  1. Treat inflammation:   A.  Budesonide 0.5 mg -nebulize 1-2 times per day  B.  Brovana -nebulize 1-2 times per day  C.  Nasacort - 1-2 sprays each nostril 1-7 times per week  2. Continue to Treat reflux:   A.  Omeprazole 40mg  1 tablet two times per day  3. Continue Albuterol HFA or albuterol nebulization 2 puffs every 4-6 hours if needed  4. Continue OTC antihistamine and Epi-Pen if needed  5. Return to clinic in 6 months or earlier if problem

## 2020-04-20 ENCOUNTER — Encounter: Payer: Self-pay | Admitting: Allergy and Immunology

## 2020-04-20 DIAGNOSIS — M1612 Unilateral primary osteoarthritis, left hip: Secondary | ICD-10-CM | POA: Diagnosis not present

## 2020-04-21 NOTE — Addendum Note (Signed)
Addended by: Osa Craver on: 04/21/2020 04:02 PM   Modules accepted: Orders

## 2020-05-01 DIAGNOSIS — J4520 Mild intermittent asthma, uncomplicated: Secondary | ICD-10-CM | POA: Diagnosis not present

## 2020-05-21 DIAGNOSIS — M1612 Unilateral primary osteoarthritis, left hip: Secondary | ICD-10-CM | POA: Diagnosis not present

## 2020-05-30 ENCOUNTER — Ambulatory Visit: Payer: Medicare HMO | Admitting: Family Medicine

## 2020-06-01 DIAGNOSIS — J4520 Mild intermittent asthma, uncomplicated: Secondary | ICD-10-CM | POA: Diagnosis not present

## 2020-06-03 ENCOUNTER — Encounter (HOSPITAL_COMMUNITY): Payer: BLUE CROSS/BLUE SHIELD

## 2020-06-03 ENCOUNTER — Other Ambulatory Visit (HOSPITAL_COMMUNITY): Payer: BLUE CROSS/BLUE SHIELD

## 2020-06-08 ENCOUNTER — Ambulatory Visit: Admit: 2020-06-08 | Payer: BLUE CROSS/BLUE SHIELD | Admitting: Orthopedic Surgery

## 2020-06-08 SURGERY — ARTHROPLASTY, HIP, TOTAL, ANTERIOR APPROACH
Anesthesia: Choice | Site: Hip | Laterality: Left

## 2020-06-21 DIAGNOSIS — M1612 Unilateral primary osteoarthritis, left hip: Secondary | ICD-10-CM | POA: Diagnosis not present

## 2020-07-01 ENCOUNTER — Telehealth (INDEPENDENT_AMBULATORY_CARE_PROVIDER_SITE_OTHER): Payer: Medicare HMO | Admitting: Family Medicine

## 2020-07-01 ENCOUNTER — Telehealth: Payer: Self-pay | Admitting: *Deleted

## 2020-07-01 ENCOUNTER — Other Ambulatory Visit: Payer: Self-pay

## 2020-07-01 DIAGNOSIS — Z79891 Long term (current) use of opiate analgesic: Secondary | ICD-10-CM | POA: Diagnosis not present

## 2020-07-01 DIAGNOSIS — I10 Essential (primary) hypertension: Secondary | ICD-10-CM | POA: Diagnosis not present

## 2020-07-01 MED ORDER — OXYCODONE-ACETAMINOPHEN 5-325 MG PO TABS
ORAL_TABLET | ORAL | 0 refills | Status: DC
Start: 2020-07-01 — End: 2020-07-28

## 2020-07-01 MED ORDER — AMLODIPINE BESYLATE 10 MG PO TABS: 10.0000 mg | ORAL_TABLET | Freq: Every day | ORAL | 1 refills | Status: DC

## 2020-07-01 NOTE — Telephone Encounter (Signed)
Ms. imagine, nest are scheduled for a virtual visit with your provider today.    Just as we do with appointments in the office, we must obtain your consent to participate.  Your consent will be active for this visit and any virtual visit you may have with one of our providers in the next 365 days.    If you have a MyChart account, I can also send a copy of this consent to you electronically.  All virtual visits are billed to your insurance company just like a traditional visit in the office.  As this is a virtual visit, video technology does not allow for your provider to perform a traditional examination.  This may limit your provider's ability to fully assess your condition.  If your provider identifies any concerns that need to be evaluated in person or the need to arrange testing such as labs, EKG, etc, we will make arrangements to do so.    Although advances in technology are sophisticated, we cannot ensure that it will always work on either your end or our end.  If the connection with a video visit is poor, we may have to switch to a telephone visit.  With either a video or telephone visit, we are not always able to ensure that we have a secure connection.   I need to obtain your verbal consent now.   Are you willing to proceed with your visit today?   Breanah Faddis has provided verbal consent on 07/01/2020 for a virtual visit (video or telephone).

## 2020-07-01 NOTE — Progress Notes (Signed)
Subjective:    Patient ID: Stephanie [name], female    DOB: 04/29/1961, 60 y.o.   MRN: [id number]  HPI  This patient was seen today for chronic pain  The medication list was reviewed and updated.   -Compliance with medication: yes  - Number patient states they take daily: 4  -when was the last dose patient took? today  The patient was advised the importance of maintaining medication and not using illegal substances with these.  Here for refills and follow up  The patient was educated that we can provide 3 monthly scripts for their medication, it is their responsibility to follow the instructions.  Side effects or complications from medications: itching  Patient is aware that pain medications are meant to minimize the severity of the pain to allow their pain levels to improve to allow for better function. They are aware of that pain medications cannot totally remove their pain.  Due for UDT ( at least once per year) : phone visit today  Has hip surgery coming up.  Is confident she would do well.  We did discuss how sometimes may have to be delayed with COVID becomes worse.  Otherwise should be able to do surgery without trouble. Needs refill Norvasc   Virtual Visit via Telephone  I connected with Stephanie [name] on 07/01/20 at 10:00 AM EST by a telephone enabled telemedicine application and verified that I am speaking with the correct person using two identifiers.  Location: Patient: home Provider: office   I discussed the limitations of evaluation and management by telemedicine and the availability of in person appointments. The patient expressed understanding and agreed to proceed.  History of Present Illness:    Observations/Objective:   Assessment and Plan:   Follow Up Instructions:    I discussed the assessment and treatment plan with the patient. The patient was provided an opportunity to ask questions and all were answered. The patient agreed with the plan and  demonstrated an understanding of the instructions.   The patient was advised to call back or seek an in-person evaluation if the symptoms worsen or if the condition fails to improve as anticipated.  I provided 20 minutes of non-face-to-face time during this encounter.     Review of Systems  Constitutional: Negative for activity change and appetite change.  HENT: Negative for congestion and rhinorrhea.   Respiratory: Negative for cough and shortness of breath.   Cardiovascular: Negative for chest pain and leg swelling.  Gastrointestinal: Negative for abdominal pain, nausea and vomiting.  Skin: Negative for color change.  Neurological: Negative for dizziness and weakness.  Psychiatric/Behavioral: Negative for agitation and confusion.       Objective:   Physical Exam  Today's visit was via telephone Physical exam was not possible for this visit  Patient is aware that there is no such thing is risk-free surgery she does not have any significant heart disease lungs been doing well she should be able to do her surgery fine it is important for her to follow surgeon's advice to minimize risk of blood clots we will do a in person visit in 4 weeks to check her blood pressure before her surgery     Assessment & Plan:  Has upcoming hip surgery should be able to do this without trouble.  Warning signs and importance of following the surgeon's advice discussed  The patient was seen in followup for chronic pain. A review over at their current pain status was discussed. Drug registry was checked.  Prescriptions were given.  Regular follow-up recommended. Discussion was held regarding the importance of compliance with medication as well as pain medication contract.  Patient was informed that medication may cause drowsiness and should not be combined  with other medications/alcohol or street drugs. If the patient feels medication is causing altered alertness then do not drive or operate dangerous  equipment.  3 scripts written nail

## 2020-07-02 DIAGNOSIS — J4520 Mild intermittent asthma, uncomplicated: Secondary | ICD-10-CM | POA: Diagnosis not present

## 2020-07-14 DIAGNOSIS — H35033 Hypertensive retinopathy, bilateral: Secondary | ICD-10-CM | POA: Diagnosis not present

## 2020-07-19 DIAGNOSIS — M1612 Unilateral primary osteoarthritis, left hip: Secondary | ICD-10-CM | POA: Diagnosis not present

## 2020-07-26 DIAGNOSIS — M1612 Unilateral primary osteoarthritis, left hip: Secondary | ICD-10-CM | POA: Diagnosis not present

## 2020-07-28 ENCOUNTER — Other Ambulatory Visit: Payer: Self-pay

## 2020-07-28 ENCOUNTER — Ambulatory Visit (INDEPENDENT_AMBULATORY_CARE_PROVIDER_SITE_OTHER): Payer: Medicare HMO | Admitting: Family Medicine

## 2020-07-28 ENCOUNTER — Encounter: Payer: Self-pay | Admitting: Family Medicine

## 2020-07-28 VITALS — BP 128/82 | HR 98 | Temp 96.7°F | Wt 190.8 lb

## 2020-07-28 DIAGNOSIS — I10 Essential (primary) hypertension: Secondary | ICD-10-CM | POA: Diagnosis not present

## 2020-07-28 DIAGNOSIS — F324 Major depressive disorder, single episode, in partial remission: Secondary | ICD-10-CM | POA: Diagnosis not present

## 2020-07-28 DIAGNOSIS — G894 Chronic pain syndrome: Secondary | ICD-10-CM | POA: Diagnosis not present

## 2020-07-28 DIAGNOSIS — M160 Bilateral primary osteoarthritis of hip: Secondary | ICD-10-CM | POA: Diagnosis not present

## 2020-07-28 MED ORDER — HYDROMORPHONE HCL 2 MG PO TABS: 2.0000 mg | ORAL_TABLET | Freq: Four times a day (QID) | ORAL | 0 refills | Status: DC | PRN

## 2020-07-28 MED ORDER — SERTRALINE HCL 100 MG PO TABS: ORAL_TABLET | ORAL | 1 refills | Status: DC

## 2020-07-28 MED ORDER — ALBUTEROL SULFATE HFA 108 (90 BASE) MCG/ACT IN AERS: 2.0000 | INHALATION_SPRAY | RESPIRATORY_TRACT | 5 refills | Status: DC | PRN

## 2020-07-28 MED ORDER — TRIAMTERENE-HCTZ 37.5-25 MG PO TABS: 1.0000 | ORAL_TABLET | Freq: Every day | ORAL | 1 refills | Status: DC

## 2020-07-28 NOTE — Progress Notes (Signed)
   Subjective:    Patient ID: Stephanie [name], female    DOB: 05-Nov-1960, 60 y.o.   MRN: [id number]  HPI Pt here for 4 week follow up on blood pressure. Pt checking blood pressure daily sometimes more. Pt states she has had some higher numbers but most have been good. Pt is having some swelling that she is concerned about. Pt toes will "lock up and cramp" real bad at times.   Right Hip has swelling-had appt with ortho a few days and they advised pt to address this with PCP.  Something stronger to deal with pain until hip surgery. Pain has become more intense. Ortho stated to speak with PCP.  Itching when taking Oxycodone. Has been noticing for months and thought it was allergies or if she skipped a bath; but pt did study on this and noticed about 15 minutes after taking Oxycodone,  she begins itching.    Review of Systems  Constitutional: Negative for activity change, appetite change and fatigue.  HENT: Negative for congestion and rhinorrhea.   Respiratory: Negative for cough and shortness of breath.   Cardiovascular: Negative for chest pain and leg swelling.  Gastrointestinal: Negative for abdominal pain and diarrhea.  Endocrine: Negative for polydipsia and polyphagia.  Skin: Negative for color change.  Neurological: Negative for dizziness and weakness.  Psychiatric/Behavioral: Negative for behavioral problems and confusion.       Objective:   Physical Exam Lungs clear respiratory rate normal heart regular with murmur Has had echo within the past year did not show any major issues Patient has subjective discomfort in the right hip and left hip Patient difficult time walking.  No calf tenderness.  No swelling.  No evidence of DVT.       Assessment & Plan:  Possible allergy to oxycodone We will switch over to Dilaudid 2 mg no more than 4 in a day.  Caution drowsiness.  Do not take oxycodone with this.  Follow-up within several weeks to see how this is doing May need additional  adjustments on medicine  Approved for surgery.  Stay away from all anti-inflammatories Has mild respiratory issues stable. Patient feels her moods are doing fairly good requests ongoing use of sertraline Blood pressure good control continue current measures

## 2020-07-28 NOTE — Patient Instructions (Addendum)
Use Dilaudid one half or one every 6 hours as needed for severe oain  Stop oxycodone If problems stop meds and notify us Recheck in 1 week

## 2020-07-30 DIAGNOSIS — J4520 Mild intermittent asthma, uncomplicated: Secondary | ICD-10-CM | POA: Diagnosis not present

## 2020-08-04 ENCOUNTER — Other Ambulatory Visit: Payer: Self-pay

## 2020-08-04 ENCOUNTER — Encounter: Payer: Self-pay | Admitting: Family Medicine

## 2020-08-04 ENCOUNTER — Ambulatory Visit (INDEPENDENT_AMBULATORY_CARE_PROVIDER_SITE_OTHER): Payer: Medicare HMO | Admitting: Family Medicine

## 2020-08-04 VITALS — BP 132/88 | HR 85 | Temp 97.2°F | Ht 64.0 in | Wt 188.6 lb

## 2020-08-04 DIAGNOSIS — Z79891 Long term (current) use of opiate analgesic: Secondary | ICD-10-CM

## 2020-08-04 MED ORDER — NALOXONE HCL 4 MG/0.1ML NA LIQD: NASAL | 0 refills | Status: DC

## 2020-08-04 MED ORDER — HYDROMORPHONE HCL 2 MG PO TABS: ORAL_TABLET | ORAL | 0 refills | Status: DC

## 2020-08-04 NOTE — Progress Notes (Signed)
   Subjective:    Patient ID: Stephanie [name], female    DOB: [birth year], 60 y.o.   MRN: [id number]  HPI This patient was seen today for chronic pain  The medication list was reviewed and updated.   -Compliance with medication: takes one half 4 times a day  - Number patient states they take daily: one half 4 times a day  -when was the last dose patient took? today  The patient was advised the importance of maintaining medication and not using illegal substances with these.  Here for refills and follow up  The patient was educated that we can provide 3 monthly scripts for their medication, it is their responsibility to follow the instructions.  Side effects or complications from medications:  None  Patient is aware that pain medications are meant to minimize the severity of the pain to allow their pain levels to improve to allow for better function. They are aware of that pain medications cannot totally remove their pain.  Due for UDT ( at least once per year) : last one 2020. Due today  Scale of 1 to 10 ( 1 is least 10 is most) Your pain level without the medicine: 8 or 9 Your pain level with medication 2 or 3   Scale 1 to 10 ( 1-helps very little, 10 helps very well) How well does your pain medication reduce your pain so you can function better through out the day? Takes pain from 8 or 9 to  2 or 3  She relates that the hydrocodone caused itching Oxycodone caused itching Dilaudid actually is doing better it does cause some slight drowsiness.  I encouraged her to stick with a half a tablet at a time.  She states that half a tablet typically will last 4 to 6 hours.  Because of this the patient should be very judicious in its use half tablet every 4-6 hours do not exceed 3 tablets a day I educated her about the risk of overdose the signs of overdose and also proper use of Narcan  Patient is aware of the potential addiction pain medicines but her pain cannot be controlled with just  Tylenol and she is allergic to anti-inflammatories Our goal is to assist her with tapering off the pain medicine after her surgery.     Review of Systems     Objective:   Physical Exam  Lungs clear heart regular pulse normal BP good hip pain back pain due to her hip issue      Assessment & Plan:  See discussion above Dilaudid for pain control half tablet every 4-6 hours caution drowsiness if excessive drowsiness stop medicine notify us Proper use of Narcan discussed for safety purposes The importance of keeping medicine safe in its storage was discussed Also very important to gradually taper off medication once she is beyond her pain from her upcoming surgery She will follow-up around 20 April.  We will send in a 2-week supply currently and recheck with her

## 2020-08-05 NOTE — Patient Instructions (Addendum)
DUE TO COVID-19 ONLY ONE VISITOR IS ALLOWED TO COME WITH YOU AND STAY IN THE WAITING ROOM ONLY DURING PRE OP AND PROCEDURE DAY OF SURGERY. THE 2 VISITORS  MAY VISIT WITH YOU AFTER SURGERY IN YOUR PRIVATE ROOM DURING VISITING HOURS ONLY!  YOU NEED TO HAVE A COVID 19 TEST ON__4/4_____ @_12 :00______, THIS TEST MUST BE DONE BEFORE SURGERY,  COVID TESTING SITE [address] [place] , IT IS ON THE RIGHT GOING OUT [address] APPROXIMATELY  2 MINUTES PAST [employer] ON THE RIGHT. ONCE YOUR COVID TEST IS COMPLETED,  PLEASE BEGIN THE QUARANTINE INSTRUCTIONS AS OUTLINED IN YOUR HANDOUT.                KELIN NIXON   Your procedure is scheduled on: 08/17/20   Report to [facility] Main  Entrance   Report to admitting at  9:55 AM     Call this number if you have problems the morning of surgery [phone]   . BRUSH YOUR TEETH MORNING OF SURGERY AND RINSE YOUR MOUTH OUT, NO CHEWING GUM CANDY OR MINTS.   No food after midnight.    You may have clear liquid until 8:30 AM.    At 8:00  AM drink pre surgery drink.   Nothing by mouth after 8:30 AM.                                                                                                                               No NSAIDS 7 days prior to day of surgery (08/11/20)   Take these medicines the morning of surgery with A SIP OF WATER: Zoloft, Amlodipine, Omeprazole.                                                                                                                              Use your inhalers and bring them with you to the hospital                                You may not have any metal on your body including hair pins and              piercings  Do not wear jewelry, make-up, lotions, powders or perfumes, deodorant             Do not  wear nail polish on your fingernails.  Do not shave  48 hours prior to surgery.  .   Do not bring valuables to the hospital. [facility] IS NOT              RESPONSIBLE   FOR VALUABLES.  Contacts, dentures or bridgework may not be worn into surgery.       Special Instructions: N/A              Please read over the following fact sheets you were given: _____________________________________________________________________             [facility] - Preparing for Surgery Before surgery, you can play an important role.  Because skin is not sterile, your skin needs to be as free of germs as possible.  You can reduce the number of germs on your skin by washing with CHG (chlorahexidine gluconate) soap before surgery.  CHG is an antiseptic cleaner which kills germs and bonds with the skin to continue killing germs even after washing. Please DO NOT use if you have an allergy to CHG or antibacterial soaps.  If your skin becomes reddened/irritated stop using the CHG and inform your nurse when you arrive at Short Stay. Do not shave (including legs and underarms) for at least 48 hours prior to the first CHG shower.   Please follow these instructions carefully:  1.  Shower with CHG Soap the night before surgery and the  morning of Surgery.  2.  If you choose to wash your hair, wash your hair first as usual with your  normal  shampoo.  3.  After you shampoo, rinse your hair and body thoroughly to remove the  shampoo.                                        4.  Use CHG as you would any other liquid soap.  You can apply chg directly  to the skin and wash                       Gently with a scrungie or clean washcloth.  5.  Apply the CHG Soap to your body ONLY FROM THE NECK DOWN.   Do not use on face/ open                           Wound or open sores. Avoid contact with eyes, ears mouth and genitals (private parts).                       Wash face,  Genitals (private parts) with your normal soap.             6.  Wash thoroughly, paying special attention to the area where your surgery  will be performed.  7.  Thoroughly rinse your body with warm water from  the neck down.  8.  DO NOT shower/wash with your normal soap after using and rinsing off  the CHG Soap.             9.  Pat yourself dry with a clean towel.            10.  Wear clean pajamas.            11.  Place clean sheets on your bed the night of  your first shower and do not  sleep with pets. Day of Surgery : Do not apply any lotions/deodorants the morning of surgery.  Please wear clean clothes to the hospital/surgery center.  FAILURE TO FOLLOW THESE INSTRUCTIONS MAY RESULT IN THE CANCELLATION OF YOUR SURGERY PATIENT SIGNATURE_________________________________  NURSE SIGNATURE__________________________________  ________________________________________________________________________   Adam Phenix  An incentive spirometer is a tool that can help keep your lungs clear and active. This tool measures how well you are filling your lungs with each breath. Taking long deep breaths may help reverse or decrease the chance of developing breathing (pulmonary) problems (especially infection) following:  A long period of time when you are unable to move or be active. BEFORE THE PROCEDURE   If the spirometer includes an indicator to show your best effort, your nurse or respiratory therapist will set it to a desired goal.  If possible, sit up straight or lean slightly forward. Try not to slouch.  Hold the incentive spirometer in an upright position. INSTRUCTIONS FOR USE  1. Sit on the edge of your bed if possible, or sit up as far as you can in bed or on a chair. 2. Hold the incentive spirometer in an upright position. 3. Breathe out normally. 4. Place the mouthpiece in your mouth and seal your lips tightly around it. 5. Breathe in slowly and as deeply as possible, raising the piston or the ball toward the top of the column. 6. Hold your breath for 3-5 seconds or for as long as possible. Allow the piston or ball to fall to the bottom of the column. 7. Remove the mouthpiece from your  mouth and breathe out normally. 8. Rest for a few seconds and repeat Steps 1 through 7 at least 10 times every 1-2 hours when you are awake. Take your time and take a few normal breaths between deep breaths. 9. The spirometer may include an indicator to show your best effort. Use the indicator as a goal to work toward during each repetition. 10. After each set of 10 deep breaths, practice coughing to be sure your lungs are clear. If you have an incision (the cut made at the time of surgery), support your incision when coughing by placing a pillow or rolled up towels firmly against it. Once you are able to get out of bed, walk around indoors and cough well. You may stop using the incentive spirometer when instructed by your caregiver.  RISKS AND COMPLICATIONS  Take your time so you do not get dizzy or light-headed.  If you are in pain, you may need to take or ask for pain medication before doing incentive spirometry. It is harder to take a deep breath if you are having pain. AFTER USE  Rest and breathe slowly and easily.  It can be helpful to keep track of a log of your progress. Your caregiver can provide you with a simple table to help with this. If you are using the spirometer at home, follow these instructions: [place] IF:   You are having difficultly using the spirometer.  You have trouble using the spirometer as often as instructed.  Your pain medication is not giving enough relief while using the spirometer.  You develop fever of 100.5 F (38.1 C) or higher. SEEK IMMEDIATE MEDICAL CARE IF:   You cough up bloody sputum that had not been present before.  You develop fever of 102 F (38.9 C) or greater.  You develop worsening pain at or near the incision  site. MAKE SURE YOU:   Understand these instructions.  Will watch your condition.  Will get help right away if you are not doing well or get worse. Document Released: 09/10/2006 Document Revised: 07/23/2011  Document Reviewed: 11/11/2006 [facility] Patient Information 2014 [place], [place].   ________________________________________________________________________

## 2020-08-08 ENCOUNTER — Encounter (HOSPITAL_COMMUNITY): Payer: Self-pay | Admitting: *Deleted

## 2020-08-08 ENCOUNTER — Other Ambulatory Visit: Payer: Self-pay

## 2020-08-08 ENCOUNTER — Encounter (HOSPITAL_COMMUNITY)
Admission: RE | Admit: 2020-08-08 | Discharge: 2020-08-08 | Disposition: A | Discharge status: Home / Self Care | Payer: Medicare HMO | Source: Ambulatory Visit | Attending: Orthopedic Surgery | Admitting: Orthopedic Surgery

## 2020-08-08 DIAGNOSIS — Z01818 Encounter for other preprocedural examination: Secondary | ICD-10-CM | POA: Diagnosis not present

## 2020-08-08 HISTORY — DX: Gastro-esophageal reflux disease without esophagitis: K21.9

## 2020-08-08 LAB — COMPREHENSIVE METABOLIC PANEL
ALT: 18 U/L (ref 0–44)
AST: 19 U/L (ref 15–41)
Albumin: 4.3 g/dL (ref 3.5–5.0)
Alkaline Phosphatase: 83 U/L (ref 38–126)
Anion gap: 11 (ref 5–15)
BUN: 11 mg/dL (ref 6–20)
CO2: 27 mmol/L (ref 22–32)
Calcium: 9.4 mg/dL (ref 8.9–10.3)
Chloride: 98 mmol/L (ref 98–111)
Creatinine, Ser: 0.63 mg/dL (ref 0.44–1.00)
GFR, Estimated: >60 mL/min (ref >60)
Glucose, Bld: 95 mg/dL (ref 70–99)
Potassium: 2.8 mmol/L — ABNORMAL LOW (ref 3.5–5.1)
Sodium: 136 mmol/L (ref 135–145)
Total Bilirubin: 1.2 mg/dL (ref 0.3–1.2)
Total Protein: 8 g/dL (ref 6.5–8.1)

## 2020-08-08 LAB — CBC
HCT: 42.3 % (ref 36.0–46.0)
Hemoglobin: 14.3 g/dL (ref 12.0–15.0)
MCH: 30 pg (ref 26.0–34.0)
MCHC: 33.8 g/dL (ref 30.0–36.0)
MCV: 88.7 fL (ref 80.0–100.0)
Platelets: 289 10*3/uL (ref 150–400)
RBC: 4.77 MIL/uL (ref 3.87–5.11)
RDW: 13.8 % (ref 11.5–15.5)
WBC: 6.8 10*3/uL (ref 4.0–10.5)
nRBC: 0 % (ref 0.0–0.2)

## 2020-08-08 LAB — PROTIME-INR
INR: 1 (ref 0.8–1.2)
Prothrombin Time: 12.5 seconds (ref 11.4–15.2)

## 2020-08-08 LAB — SURGICAL PCR SCREEN
MRSA, PCR: NEGATIVE
Staphylococcus aureus: NEGATIVE

## 2020-08-08 LAB — APTT: aPTT: 33 seconds (ref 24–36)

## 2020-08-08 NOTE — Progress Notes (Signed)
Pt did not come to PAT visit contact numbers were called.

## 2020-08-08 NOTE — Progress Notes (Signed)
COVID Vaccine Completed:Yes Date COVID Vaccine completed:10/01/19 Booster was moderna 03/15/20 COVID vaccine manufacturer: Pfizer     PCP - Dr. Fletcher Anon Cardiologist - none  Chest x-ray - no EKG - 08/08/20-chart Stress Test - no ECHO - 12/16/19-epic Cardiac Cath - no Pacemaker/ICD device last checked:NA  Sleep Study - no CPAP -   Fasting Blood Sugar - NA Checks Blood Sugar _____ times a day  Blood Thinner Instructions:NA Aspirin Instructions: Last Dose:  Anesthesia review:   Patient denies shortness of breath, fever, cough and chest pain at PAT appointment yes Patient verbalized understanding of instructions that were given to them at the PAT appointment. Patient was also instructed that they will need to review over the PAT instructions again at home before surgery.yes Pt can climb 2 flights of stairs, do housework and ADLs without SOB She does use inhalers everyday.

## 2020-08-10 LAB — TOXASSURE SELECT 13 (MW), URINE

## 2020-08-10 NOTE — H&P (Signed)
TOTAL HIP ADMISSION H&P  Patient is admitted for left total hip arthroplasty.  Subjective:  Chief Complaint: Left hip pain  HPI: Stephanie [name], 60 y.o. female, has a history of pain and functional disability in the left hip due to arthritis and patient has failed non-surgical conservative treatments for greater than 12 weeks to include use of assistive devices and activity modification. Onset of symptoms was gradual, starting >10 years ago with gradually worsening course since that time. The patient noted no past surgery on the left hip. Patient currently rates pain in the left hip at 9 out of 10 with activity. Patient has night pain, worsening of pain with activity and weight bearing, pain that interfers with activities of daily living and crepitus. Patient has evidence of advanced bone-on-bone arthritis with significant pertrusio deformity by imaging studies. This condition presents safety issues increasing the risk of falls. There is no current active infection.  Patient Active Problem List   Diagnosis Date Noted  . Screening for colon cancer 03/07/2020  . Post-menopausal bleeding 03/07/2020  . Depression, major, single episode, in partial remission (HCC) 03/07/2020  . Need for vaccination 03/07/2020  . OA (osteoarthritis) of hip 03/27/2017  . History of contact dermatitis 02/11/2017  . Chronic pain syndrome 01/09/2017  . 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 Moderate persistent asthma 04/27/2015  . Allergic rhinoconjunctivitis 04/27/2015  . Right-sided low back pain with right-sided sciatica 02/22/2015  . Essential hypertension 10/17/2012    Past Medical History:  Diagnosis Date  . Allergic rhinitis   . Arthritis   . Asthma   . Depression   . GERD (gastroesophageal reflux disease)   . Heart murmur   . Hypertension   . Pneumonia    hx of x 3     Past Surgical History:  Procedure Laterality Date  . CESAREAN SECTION  1985, 1989, 1995  . TONSILLECTOMY    . TOTAL  HIP ARTHROPLASTY Right 03/27/2017   Procedure: RIGHT TOTAL HIP ARTHROPLASTY ANTERIOR APPROACH;  Surgeon: Ollen Gross, MD;  Location: [facility] ORS;  Service: Orthopedics;  Laterality: Right;    Prior to Admission medications   Medication Sig Start Date End Date Taking? Authorizing Provider  albuterol (PROVENTIL HFA) 108 (90 Base) MCG/ACT inhaler Inhale 2 puffs into the lungs every 4 (four) hours as needed for wheezing or shortness of breath. 07/28/20  Yes Luking, Jonna Coup, MD  albuterol (PROVENTIL) (2.5 MG/3ML) 0.083% nebulizer solution Take 3 mLs (2.5 mg total) by nebulization every 4 (four) hours as needed for wheezing or shortness of breath. 03/01/20  Yes Kozlow, Alvira Philips, MD  amLODipine (NORVASC) 10 MG tablet Take 1 tablet (10 mg total) by mouth daily. 07/01/20  Yes Babs Sciara, MD  Ascorbic Acid (VITAMIN C) 1000 MG tablet Take 1,000 mg by mouth daily.   Yes [provider]  BROVANA 15 MCG/2ML NEBU Take 2 mLs (15 mcg total) by nebulization in the morning and at bedtime. Patient taking differently: Take 15 mcg by nebulization 2 (two) times daily as needed (shortness of breath). 03/01/20  Yes Kozlow, Alvira Philips, MD  budesonide (PULMICORT) 0.5 MG/2ML nebulizer solution Take 2 mLs (0.5 mg total) by nebulization in the morning and at bedtime. Patient taking differently: Take 0.5 mg by nebulization 2 (two) times daily as needed (Shortness of breath). 03/01/20  Yes Kozlow, Alvira Philips, MD  EPINEPHrine (EPIPEN 2-PAK) 0.3 mg/0.3 mL IJ SOAJ injection Inject 0.3 mLs (0.3 mg total) into the muscle once as needed (allergic reaction). Patient taking  differently: Inject 0.3 mg into the muscle once as needed for anaphylaxis (allergic reaction). 01/07/20  Yes Kozlow, Alvira PhilipsEric J, MD  formoterol (PERFOROMIST) 20 MCG/2ML nebulizer solution Take 2 mLs (20 mcg total) by nebulization 2 (two) times daily. Patient taking differently: Take 20 mcg by nebulization 2 (two) times daily as needed (Shortness of breath). 03/07/20  Yes  Kozlow, Alvira PhilipsEric J, MD  GARLIC PO Take 1 tablet by mouth daily.   Yes [provider]  ketoconazole (NIZORAL) 2 % cream Apply to rash bid for 3 weeks Patient taking differently: Apply 1 application topically daily as needed (Rash). 12/11/19  Yes Luking, Scott A, MD  montelukast (SINGULAIR) 10 MG tablet Take 1 tablet (10 mg total) by mouth at bedtime. 03/01/20  Yes Kozlow, Alvira PhilipsEric J, MD  omeprazole (PRILOSEC) 40 MG capsule Take 1 capsule (40 mg total) by mouth in the morning and at bedtime. Patient taking differently: Take 40 mg by mouth daily as needed. 03/01/20  Yes Kozlow, Alvira PhilipsEric J, MD  prednisoLONE acetate (PRED FORTE) 1 % ophthalmic suspension Place 1 drop into the right eye 3 (three) times daily.   Yes [provider]  sertraline (ZOLOFT) 100 MG tablet Take one tablet po daily Patient taking differently: Take 100 mg by mouth daily. Take one tablet po daily 07/28/20  Yes Luking, Jonna CoupScott A, MD  triamterene-hydrochlorothiazide (MAXZIDE-25) 37.5-25 MG tablet Take 1 tablet by mouth daily. 07/28/20  Yes Luking, Jonna CoupScott A, MD  VENTOLIN HFA 108 (90 Base) MCG/ACT inhaler Inhale 2 puffs into the lungs every 4 (four) hours as needed for wheezing or shortness of breath. 03/01/20  Yes Kozlow, Alvira PhilipsEric J, MD  vitamin E 100 UNIT capsule Take 100 Units by mouth daily.   Yes [provider]  Zinc Gluconate 30 MG TABS Take 30 mg by mouth daily.   Yes [provider]  HYDROmorphone (DILAUDID) 2 MG tablet Take one half tablet every 4 to 6 hours prn pain. No more than 3 full tablets in a day 08/04/20   Babs SciaraLuking, Scott A, MD  naloxone [facility](NARCAN) nasal spray 4 mg/0.1 mL Use as directed 08/04/20   Babs SciaraLuking, Scott A, MD    Allergies  Allergen Reactions  . Amoxicillin Rash    fash swelled up and itching 04/2018  . Aspirin Anaphylaxis  . Other Anaphylaxis    Nsaids  . Hydrocodone Itching       . Latex Hives and Itching  . Oxycodone     itching  . Levaquin [Levofloxacin]     Rash on chest no hives  04/2018    Social History   Socioeconomic History  . Marital status: Divorced    Spouse name: Not on file  . Number of children: Not on file  . Years of education: Not on file  . Highest education level: Not on file  Occupational History  . Not on file  Tobacco Use  . Smoking status: Former Smoker    Packs/day: 0.25    Years: 18.00    Pack years: 4.50    Types: Cigarettes    Start date: 09/24/1994    Quit date: 03/06/2013    Years since quitting: 7.4  . Smokeless tobacco: Never Used  Vaping Use  . Vaping Use: Never used  Substance and Sexual Activity  . Alcohol use: Yes    Alcohol/week: 0.0 standard drinks    Comment: wine and beer on occasion  . Drug use: No  . Sexual activity: Not on file  Other Topics Concern  .  Not on file  Social History Narrative  . Not on file   Social Determinants of Health   Financial Resource Strain: Not on file  Food Insecurity: Not on file  Transportation Needs: Not on file  Physical Activity: Not on file  Stress: Not on file  Social Connections: Not on file  Intimate Partner Violence: Not on file    Tobacco Use: Medium Risk  . Smoking Tobacco Use: Former Smoker  . Smokeless Tobacco Use: Never Used   Social History   Substance and Sexual Activity  Alcohol Use Yes  . Alcohol/week: 0.0 standard drinks   Comment: wine and beer on occasion    Family History  Problem Relation Age of Onset  . Hypertension Mother   . Hypertension Father     Review of Systems  Constitutional: Negative for chills and fever.  HENT: Negative for congestion, sore throat and tinnitus.   Eyes: Negative for double vision, photophobia and pain.  Respiratory: Negative for cough, shortness of breath and wheezing.   Cardiovascular: Negative for chest pain, palpitations and orthopnea.  Gastrointestinal: Negative for heartburn, nausea and vomiting.  Genitourinary: Negative for dysuria, frequency and urgency.  Musculoskeletal: Positive for joint pain.   Neurological: Negative for dizziness, weakness and headaches.     Objective:  Physical Exam: Well nourished and well developed.  General: Alert and oriented x3, cooperative and pleasant, no acute distress.  Head: normocephalic, atraumatic, neck supple.  Eyes: EOMI.  Respiratory: breath sounds clear in all fields, no wheezing, rales, or rhonchi. Cardiovascular: Regular rate and rhythm, no murmurs, gallops or rubs.  Abdomen: non-tender to palpation and soft, normoactive bowel sounds. Musculoskeletal:  Left Hip Exam:  ROM: Flexion to 90, No Internal Rotation, No External Rotation, and abduction 20 with discomfort.  Mild tenderness over the left greater trochanteric bursa.  Calves soft and nontender. Motor function intact in LE. Strength 5/5 LE bilaterally. Neuro: Distal pulses 2+. Sensation to light touch intact in LE.  Imaging Review Plain radiographs demonstrate severe degenerative joint disease of the left hip. The bone quality appears to be adequate for age and reported activity level.  Assessment/Plan:  End stage arthritis, left hip  The patient history, physical examination, clinical judgement of the provider and imaging studies are consistent with end stage degenerative joint disease of the left hip and total hip arthroplasty is deemed medically necessary. The treatment options including medical management, injection therapy, arthroscopy and arthroplasty were discussed at length. The risks and benefits of total hip arthroplasty were presented and reviewed. The risks due to aseptic loosening, infection, stiffness, dislocation/subluxation, thromboembolic complications and other imponderables were discussed. The patient acknowledged the explanation, agreed to proceed with the plan and consent was signed. Patient is being admitted for inpatient treatment for surgery, pain control, PT, OT, prophylactic antibiotics, VTE prophylaxis, progressive ambulation and ADLs and discharge  planning.The patient is planning to be discharged home.   Patient's anticipated LOS is less than 2 midnights, meeting these requirements: - Lives within 1 hour of care - Has a competent adult at home to recover with post-op recover - NO history of  - Chronic pain requiring opiods  - Diabetes  - Coronary Artery Disease  - Heart failure  - Heart attack  - Stroke  - DVT/VTE  - Cardiac arrhythmia  - Respiratory Failure/COPD  - Renal failure  - Anemia  - Advanced Liver disease  Therapy Plans: HEP Disposition: Home with daughter Planned DVT Prophylaxis: Xarelto 10 mg QD (allergy to ASA) DME Needed:  Dan Humphreys PCP: Lilyan Punt, MD (clearance received) TXA: IV Allergies: Aspirin (anaphylaxis) Anesthesia Concerns: None BMI: 32.2 Last HgbA1c: Not diabetic Pharmacy: [facility]  Other: Percocet 5-325 mg Q6. Discussed dilaudid for postoperative pain.  - Patient was instructed on what medications to stop prior to surgery. - Follow-up visit in 2 weeks with Dr. Lequita Halt - Begin physical therapy following surgery - Pre-operative lab work as pre-surgical testing - Prescriptions will be provided in hospital at time of discharge  Arther Abbott, PA-C Orthopedic Surgery [facility] [place]

## 2020-08-15 ENCOUNTER — Other Ambulatory Visit (HOSPITAL_COMMUNITY)
Admission: RE | Admit: 2020-08-15 | Discharge: 2020-08-15 | Disposition: A | Discharge status: Home / Self Care | Payer: Medicare HMO | Source: Ambulatory Visit | Attending: Orthopedic Surgery | Admitting: Orthopedic Surgery

## 2020-08-15 DIAGNOSIS — Z20822 Contact with and (suspected) exposure to covid-19: Secondary | ICD-10-CM | POA: Insufficient documentation

## 2020-08-15 DIAGNOSIS — Z01812 Encounter for preprocedural laboratory examination: Secondary | ICD-10-CM | POA: Diagnosis not present

## 2020-08-15 LAB — SARS CORONAVIRUS 2 (TAT 6-24 HRS): SARS Coronavirus 2: NEGATIVE

## 2020-08-17 ENCOUNTER — Ambulatory Visit (HOSPITAL_COMMUNITY): Payer: Medicare HMO

## 2020-08-17 ENCOUNTER — Observation Stay (HOSPITAL_COMMUNITY)
Admission: RE | Admit: 2020-08-17 | Discharge: 2020-08-18 | Disposition: A | Discharge status: Home / Self Care | Payer: Medicare HMO | Source: Ambulatory Visit | Attending: Orthopedic Surgery | Admitting: Orthopedic Surgery

## 2020-08-17 ENCOUNTER — Ambulatory Visit (HOSPITAL_COMMUNITY): Payer: Medicare HMO | Admitting: Anesthesiology

## 2020-08-17 ENCOUNTER — Observation Stay (HOSPITAL_COMMUNITY): Payer: Medicare HMO

## 2020-08-17 ENCOUNTER — Encounter (HOSPITAL_COMMUNITY): Payer: Self-pay | Admitting: Orthopedic Surgery

## 2020-08-17 ENCOUNTER — Other Ambulatory Visit: Payer: Self-pay

## 2020-08-17 ENCOUNTER — Ambulatory Visit (HOSPITAL_COMMUNITY): Payer: Medicare HMO | Admitting: Physician Assistant

## 2020-08-17 ENCOUNTER — Encounter (HOSPITAL_COMMUNITY)
Admission: RE | Disposition: A | Discharge status: Home / Self Care | Payer: Self-pay | Source: Ambulatory Visit | Attending: Orthopedic Surgery

## 2020-08-17 DIAGNOSIS — Z96649 Presence of unspecified artificial hip joint: Secondary | ICD-10-CM

## 2020-08-17 DIAGNOSIS — M1612 Unilateral primary osteoarthritis, left hip: Principal | ICD-10-CM | POA: Diagnosis present

## 2020-08-17 DIAGNOSIS — J4540 Moderate persistent asthma, uncomplicated: Secondary | ICD-10-CM | POA: Diagnosis not present

## 2020-08-17 DIAGNOSIS — Z87891 Personal history of nicotine dependence: Secondary | ICD-10-CM | POA: Insufficient documentation

## 2020-08-17 DIAGNOSIS — J45909 Unspecified asthma, uncomplicated: Secondary | ICD-10-CM | POA: Insufficient documentation

## 2020-08-17 DIAGNOSIS — Z91040 Latex allergy status: Secondary | ICD-10-CM | POA: Diagnosis not present

## 2020-08-17 DIAGNOSIS — Z79899 Other long term (current) drug therapy: Secondary | ICD-10-CM | POA: Insufficient documentation

## 2020-08-17 DIAGNOSIS — I10 Essential (primary) hypertension: Secondary | ICD-10-CM | POA: Insufficient documentation

## 2020-08-17 DIAGNOSIS — Z419 Encounter for procedure for purposes other than remedying health state, unspecified: Secondary | ICD-10-CM

## 2020-08-17 DIAGNOSIS — Z471 Aftercare following joint replacement surgery: Secondary | ICD-10-CM | POA: Diagnosis not present

## 2020-08-17 DIAGNOSIS — F324 Major depressive disorder, single episode, in partial remission: Secondary | ICD-10-CM | POA: Diagnosis not present

## 2020-08-17 DIAGNOSIS — Z96642 Presence of left artificial hip joint: Secondary | ICD-10-CM | POA: Diagnosis not present

## 2020-08-17 HISTORY — PX: TOTAL HIP ARTHROPLASTY: SHX124

## 2020-08-17 LAB — BASIC METABOLIC PANEL
Anion gap: 10 (ref 5–15)
BUN: 11 mg/dL (ref 6–20)
CO2: 28 mmol/L (ref 22–32)
Calcium: 9.2 mg/dL (ref 8.9–10.3)
Chloride: 101 mmol/L (ref 98–111)
Creatinine, Ser: 0.7 mg/dL (ref 0.44–1.00)
GFR, Estimated: >60 mL/min (ref >60)
Glucose, Bld: 126 mg/dL — ABNORMAL HIGH (ref 70–99)
Potassium: 3.2 mmol/L — ABNORMAL LOW (ref 3.5–5.1)
Sodium: 139 mmol/L (ref 135–145)

## 2020-08-17 LAB — TYPE AND SCREEN
ABO/RH(D): O POS
Antibody Screen: NEGATIVE

## 2020-08-17 SURGERY — ARTHROPLASTY, HIP, TOTAL, ANTERIOR APPROACH
Anesthesia: Spinal | Site: Hip | Laterality: Left

## 2020-08-17 MED ORDER — LACTATED RINGERS IV SOLN: INTRAVENOUS | Status: DC

## 2020-08-17 MED ORDER — PHENOL 1.4 % MT LIQD: 1.0000 | OROMUCOSAL | Status: DC | PRN

## 2020-08-17 MED ORDER — PANTOPRAZOLE SODIUM 40 MG PO TBEC
40.0000 mg | DELAYED_RELEASE_TABLET | Freq: Every day | ORAL | Status: DC
  Administered 2020-08-18: 40 mg via ORAL
  Filled 2020-08-17: qty 1

## 2020-08-17 MED ORDER — PROPOFOL 500 MG/50ML IV EMUL
INTRAVENOUS | Status: DC | PRN
  Administered 2020-08-17: 25 ug/kg/min via INTRAVENOUS

## 2020-08-17 MED ORDER — LIDOCAINE 2% (20 MG/ML) 5 ML SYRINGE
INTRAMUSCULAR | Status: AC
  Filled 2020-08-17: qty 5

## 2020-08-17 MED ORDER — AMLODIPINE BESYLATE 10 MG PO TABS
10.0000 mg | ORAL_TABLET | Freq: Every day | ORAL | Status: DC
  Administered 2020-08-18: 10 mg via ORAL
  Filled 2020-08-17: qty 1

## 2020-08-17 MED ORDER — SERTRALINE HCL 100 MG PO TABS
100.0000 mg | ORAL_TABLET | Freq: Every day | ORAL | Status: DC
  Administered 2020-08-18: 100 mg via ORAL
  Filled 2020-08-17: qty 1

## 2020-08-17 MED ORDER — ALBUMIN HUMAN 5 % IV SOLN
INTRAVENOUS | Status: AC
  Filled 2020-08-17: qty 250

## 2020-08-17 MED ORDER — ONDANSETRON HCL 4 MG/2ML IJ SOLN: 4.0000 mg | Freq: Four times a day (QID) | INTRAMUSCULAR | Status: DC | PRN

## 2020-08-17 MED ORDER — BUPIVACAINE-EPINEPHRINE (PF) 0.25% -1:200000 IJ SOLN
INTRAMUSCULAR | Status: DC | PRN
  Administered 2020-08-17: 30 mL

## 2020-08-17 MED ORDER — ONDANSETRON HCL 4 MG PO TABS
4.0000 mg | ORAL_TABLET | Freq: Four times a day (QID) | ORAL | Status: DC | PRN
Start: 2020-08-17 — End: 2020-08-18

## 2020-08-17 MED ORDER — LIDOCAINE HCL (CARDIAC) PF 100 MG/5ML IV SOSY
PREFILLED_SYRINGE | INTRAVENOUS | Status: DC | PRN
  Administered 2020-08-17: 50 mg via INTRAVENOUS

## 2020-08-17 MED ORDER — CEFAZOLIN SODIUM-DEXTROSE 2-4 GM/100ML-% IV SOLN
2.0000 g | INTRAVENOUS | Status: DC
  Filled 2020-08-17: qty 100

## 2020-08-17 MED ORDER — CHLORHEXIDINE GLUCONATE 0.12 % MT SOLN
15.0000 mL | Freq: Once | OROMUCOSAL | Status: AC
  Administered 2020-08-17: 15 mL via OROMUCOSAL

## 2020-08-17 MED ORDER — POLYETHYLENE GLYCOL 3350 17 G PO PACK: 17.0000 g | PACK | Freq: Every day | ORAL | Status: DC | PRN

## 2020-08-17 MED ORDER — METOCLOPRAMIDE HCL 5 MG PO TABS: 5.0000 mg | ORAL_TABLET | Freq: Three times a day (TID) | ORAL | Status: DC | PRN

## 2020-08-17 MED ORDER — ONDANSETRON HCL 4 MG/2ML IJ SOLN
INTRAMUSCULAR | Status: DC | PRN
  Administered 2020-08-17: 4 mg via INTRAVENOUS

## 2020-08-17 MED ORDER — MORPHINE SULFATE (PF) 2 MG/ML IV SOLN: 0.5000 mg | INTRAVENOUS | Status: DC | PRN

## 2020-08-17 MED ORDER — HYDROMORPHONE HCL 2 MG PO TABS
2.0000 mg | ORAL_TABLET | ORAL | Status: DC | PRN
Start: 2020-08-17 — End: 2020-08-18
  Administered 2020-08-17 (×2): 2 mg via ORAL
  Administered 2020-08-17: 4 mg via ORAL
  Administered 2020-08-18: 2 mg via ORAL
  Administered 2020-08-18 (×2): 4 mg via ORAL
  Filled 2020-08-17: qty 2
  Filled 2020-08-17 (×3): qty 1
  Filled 2020-08-17 (×2): qty 2

## 2020-08-17 MED ORDER — SODIUM CHLORIDE 0.9 % IV SOLN: INTRAVENOUS | Status: DC

## 2020-08-17 MED ORDER — WATER FOR IRRIGATION, STERILE IR SOLN
Status: DC | PRN
  Administered 2020-08-17: 2000 mL

## 2020-08-17 MED ORDER — BUPIVACAINE IN DEXTROSE 0.75-8.25 % IT SOLN
INTRATHECAL | Status: DC | PRN
  Administered 2020-08-17: 1.6 mL via INTRATHECAL

## 2020-08-17 MED ORDER — BUPIVACAINE-EPINEPHRINE (PF) 0.25% -1:200000 IJ SOLN
INTRAMUSCULAR | Status: AC
  Filled 2020-08-17: qty 30

## 2020-08-17 MED ORDER — DEXAMETHASONE SODIUM PHOSPHATE 10 MG/ML IJ SOLN
INTRAMUSCULAR | Status: AC
  Filled 2020-08-17: qty 1

## 2020-08-17 MED ORDER — METOCLOPRAMIDE HCL 5 MG/ML IJ SOLN: 5.0000 mg | Freq: Three times a day (TID) | INTRAMUSCULAR | Status: DC | PRN

## 2020-08-17 MED ORDER — ALBUTEROL SULFATE HFA 108 (90 BASE) MCG/ACT IN AERS: 2.0000 | INHALATION_SPRAY | RESPIRATORY_TRACT | Status: DC | PRN

## 2020-08-17 MED ORDER — FENTANYL CITRATE (PF) 100 MCG/2ML IJ SOLN
INTRAMUSCULAR | Status: DC | PRN
  Administered 2020-08-17: 25 ug via INTRAVENOUS

## 2020-08-17 MED ORDER — PREDNISOLONE ACETATE 1 % OP SUSP
1.0000 [drp] | Freq: Every day | OPHTHALMIC | Status: DC
  Administered 2020-08-18: 1 [drp] via OPHTHALMIC
  Filled 2020-08-17: qty 5

## 2020-08-17 MED ORDER — METHOCARBAMOL 500 MG IVPB - SIMPLE MED
500.0000 mg | Freq: Four times a day (QID) | INTRAVENOUS | Status: DC | PRN
  Filled 2020-08-17: qty 50

## 2020-08-17 MED ORDER — ORAL CARE MOUTH RINSE: 15.0000 mL | Freq: Once | OROMUCOSAL | Status: AC

## 2020-08-17 MED ORDER — BUDESONIDE 0.5 MG/2ML IN SUSP: 0.5000 mg | Freq: Two times a day (BID) | RESPIRATORY_TRACT | Status: DC | PRN

## 2020-08-17 MED ORDER — ALBUTEROL SULFATE (2.5 MG/3ML) 0.083% IN NEBU: 2.5000 mg | INHALATION_SOLUTION | RESPIRATORY_TRACT | Status: DC | PRN

## 2020-08-17 MED ORDER — METHOCARBAMOL 500 MG IVPB - SIMPLE MED
INTRAVENOUS | Status: AC
  Administered 2020-08-17: 500 mg via INTRAVENOUS
  Filled 2020-08-17: qty 50

## 2020-08-17 MED ORDER — PROPOFOL 500 MG/50ML IV EMUL
INTRAVENOUS | Status: AC
  Filled 2020-08-17: qty 50

## 2020-08-17 MED ORDER — DEXAMETHASONE SODIUM PHOSPHATE 10 MG/ML IJ SOLN
10.0000 mg | Freq: Once | INTRAMUSCULAR | Status: AC
  Administered 2020-08-18: 10 mg via INTRAVENOUS
  Filled 2020-08-17: qty 1

## 2020-08-17 MED ORDER — MIDAZOLAM HCL 2 MG/2ML IJ SOLN
INTRAMUSCULAR | Status: AC
  Filled 2020-08-17: qty 2

## 2020-08-17 MED ORDER — CEFAZOLIN SODIUM-DEXTROSE 2-4 GM/100ML-% IV SOLN
2.0000 g | Freq: Four times a day (QID) | INTRAVENOUS | Status: AC
  Administered 2020-08-17 (×2): 2 g via INTRAVENOUS
  Filled 2020-08-17 (×2): qty 100

## 2020-08-17 MED ORDER — EPHEDRINE 5 MG/ML INJ
INTRAVENOUS | Status: AC
  Filled 2020-08-17: qty 10

## 2020-08-17 MED ORDER — FENTANYL CITRATE (PF) 100 MCG/2ML IJ SOLN
INTRAMUSCULAR | Status: AC
  Administered 2020-08-17: 50 ug via INTRAVENOUS
  Filled 2020-08-17: qty 2

## 2020-08-17 MED ORDER — MAGNESIUM CITRATE PO SOLN: 1.0000 | Freq: Once | ORAL | Status: DC | PRN

## 2020-08-17 MED ORDER — MENTHOL 3 MG MT LOZG: 1.0000 | LOZENGE | OROMUCOSAL | Status: DC | PRN

## 2020-08-17 MED ORDER — PROMETHAZINE HCL 25 MG/ML IJ SOLN: 6.2500 mg | INTRAMUSCULAR | Status: DC | PRN

## 2020-08-17 MED ORDER — ARFORMOTEROL TARTRATE 15 MCG/2ML IN NEBU
15.0000 ug | INHALATION_SOLUTION | Freq: Two times a day (BID) | RESPIRATORY_TRACT | Status: DC
  Administered 2020-08-17 – 2020-08-18 (×2): 15 ug via RESPIRATORY_TRACT
  Filled 2020-08-17 (×2): qty 2

## 2020-08-17 MED ORDER — VANCOMYCIN HCL IN DEXTROSE 1-5 GM/200ML-% IV SOLN
1000.0000 mg | INTRAVENOUS | Status: AC
  Administered 2020-08-17: 1000 mg via INTRAVENOUS

## 2020-08-17 MED ORDER — FENTANYL CITRATE (PF) 100 MCG/2ML IJ SOLN
25.0000 ug | INTRAMUSCULAR | Status: DC | PRN
  Administered 2020-08-17: 50 ug via INTRAVENOUS

## 2020-08-17 MED ORDER — BISACODYL 10 MG RE SUPP: 10.0000 mg | Freq: Every day | RECTAL | Status: DC | PRN

## 2020-08-17 MED ORDER — MIDAZOLAM HCL 5 MG/5ML IJ SOLN
INTRAMUSCULAR | Status: DC | PRN
  Administered 2020-08-17: 1 mg via INTRAVENOUS

## 2020-08-17 MED ORDER — DOCUSATE SODIUM 100 MG PO CAPS
100.0000 mg | ORAL_CAPSULE | Freq: Two times a day (BID) | ORAL | Status: DC
  Administered 2020-08-17 – 2020-08-18 (×2): 100 mg via ORAL
  Filled 2020-08-17 (×2): qty 1

## 2020-08-17 MED ORDER — EPINEPHRINE 0.3 MG/0.3ML IJ SOAJ: 0.3000 mg | Freq: Once | INTRAMUSCULAR | Status: DC | PRN

## 2020-08-17 MED ORDER — RIVAROXABAN 10 MG PO TABS
10.0000 mg | ORAL_TABLET | Freq: Every day | ORAL | Status: DC
  Administered 2020-08-18: 10 mg via ORAL
  Filled 2020-08-17: qty 1

## 2020-08-17 MED ORDER — FENTANYL CITRATE (PF) 100 MCG/2ML IJ SOLN
INTRAMUSCULAR | Status: AC
  Filled 2020-08-17: qty 2

## 2020-08-17 MED ORDER — ALBUMIN HUMAN 5 % IV SOLN: INTRAVENOUS | Status: DC | PRN

## 2020-08-17 MED ORDER — 0.9 % SODIUM CHLORIDE (POUR BTL) OPTIME
TOPICAL | Status: DC | PRN
  Administered 2020-08-17: 1000 mL

## 2020-08-17 MED ORDER — POVIDONE-IODINE 10 % EX SWAB
2.0000 "application " | Freq: Once | CUTANEOUS | Status: AC
  Administered 2020-08-17: 2 via TOPICAL

## 2020-08-17 MED ORDER — VANCOMYCIN HCL IN DEXTROSE 1-5 GM/200ML-% IV SOLN
INTRAVENOUS | Status: AC
  Filled 2020-08-17: qty 200

## 2020-08-17 MED ORDER — TRIAMTERENE-HCTZ 37.5-25 MG PO TABS
1.0000 | ORAL_TABLET | Freq: Every day | ORAL | Status: DC
  Administered 2020-08-18: 1 via ORAL
  Filled 2020-08-17: qty 1

## 2020-08-17 MED ORDER — EPHEDRINE SULFATE 50 MG/ML IJ SOLN
INTRAMUSCULAR | Status: DC | PRN
  Administered 2020-08-17: 5 mg via INTRAVENOUS

## 2020-08-17 MED ORDER — PREDNISOLONE ACETATE 1 % OP SUSP
1.0000 [drp] | Freq: Three times a day (TID) | OPHTHALMIC | Status: DC
  Filled 2020-08-17: qty 5

## 2020-08-17 MED ORDER — ACETAMINOPHEN 10 MG/ML IV SOLN
1000.0000 mg | Freq: Four times a day (QID) | INTRAVENOUS | Status: DC
  Administered 2020-08-17: 1000 mg via INTRAVENOUS
  Filled 2020-08-17: qty 100

## 2020-08-17 MED ORDER — METHOCARBAMOL 500 MG PO TABS
500.0000 mg | ORAL_TABLET | Freq: Four times a day (QID) | ORAL | Status: DC | PRN
  Administered 2020-08-17 – 2020-08-18 (×3): 500 mg via ORAL
  Filled 2020-08-17 (×3): qty 1

## 2020-08-17 MED ORDER — TRANEXAMIC ACID-NACL 1000-0.7 MG/100ML-% IV SOLN
1000.0000 mg | INTRAVENOUS | Status: AC
  Administered 2020-08-17: 1000 mg via INTRAVENOUS
  Filled 2020-08-17: qty 100

## 2020-08-17 MED ORDER — ACETAMINOPHEN 325 MG PO TABS
325.0000 mg | ORAL_TABLET | Freq: Four times a day (QID) | ORAL | Status: DC | PRN
  Administered 2020-08-17: 650 mg via ORAL
  Filled 2020-08-17: qty 2

## 2020-08-17 MED ORDER — ARFORMOTEROL TARTRATE 15 MCG/2ML IN NEBU: 15.0000 ug | INHALATION_SOLUTION | Freq: Two times a day (BID) | RESPIRATORY_TRACT | Status: DC | PRN

## 2020-08-17 MED ORDER — DEXAMETHASONE SODIUM PHOSPHATE 10 MG/ML IJ SOLN
8.0000 mg | Freq: Once | INTRAMUSCULAR | Status: AC
  Administered 2020-08-17: 8 mg via INTRAVENOUS

## 2020-08-17 SURGICAL SUPPLY — 42 items
BAG DECANTER FOR FLEXI CONT (MISCELLANEOUS) IMPLANT
BAG SPEC THK2 15X12 ZIP CLS (MISCELLANEOUS)
BAG ZIPLOCK 12X15 (MISCELLANEOUS) IMPLANT
BLADE SAG 18X100X1.27 (BLADE) ×2 IMPLANT
COVER PERINEAL POST (MISCELLANEOUS) ×2 IMPLANT
COVER SURGICAL LIGHT HANDLE (MISCELLANEOUS) ×2 IMPLANT
COVER WAND RF STERILE (DRAPES) IMPLANT
CUP ACETBLR 48 OD SECTOR II (Hips) ×1 IMPLANT
DECANTER SPIKE VIAL GLASS SM (MISCELLANEOUS) ×2 IMPLANT
DRAPE STERI IOBAN 125X83 (DRAPES) ×2 IMPLANT
DRAPE U-SHAPE 47X51 STRL (DRAPES) ×4 IMPLANT
DRSG AQUACEL AG ADV 3.5X10 (GAUZE/BANDAGES/DRESSINGS) ×2 IMPLANT
DURAPREP 26ML APPLICATOR (WOUND CARE) ×2 IMPLANT
ELECT REM PT RETURN 15FT ADLT (MISCELLANEOUS) ×2 IMPLANT
EVACUATOR 1/8 PVC DRAIN (DRAIN) IMPLANT
GLOVE SRG 8 PF TXTR STRL LF DI (GLOVE) ×1 IMPLANT
GLOVE SURG ENC MOIS LTX SZ6.5 (GLOVE) ×2 IMPLANT
GLOVE SURG ENC MOIS LTX SZ8 (GLOVE) ×4 IMPLANT
GLOVE SURG UNDER POLY LF SZ7 (GLOVE) IMPLANT
GLOVE SURG UNDER POLY LF SZ8 (GLOVE) ×2
GLOVE SURG UNDER POLY LF SZ8.5 (GLOVE) IMPLANT
GOWN STRL REUS W/TWL LRG LVL3 (GOWN DISPOSABLE) ×2 IMPLANT
GOWN STRL REUS W/TWL XL LVL3 (GOWN DISPOSABLE) IMPLANT
HEAD CERAMIC DELTA 28 P1.5 HIP (Head) ×1 IMPLANT
HOLDER FOLEY CATH W/STRAP (MISCELLANEOUS) ×2 IMPLANT
KIT TURNOVER KIT A (KITS) ×2 IMPLANT
LINER MARATHON 28 48 (Hips) ×1 IMPLANT
MANIFOLD NEPTUNE II (INSTRUMENTS) ×2 IMPLANT
PACK ANTERIOR HIP CUSTOM (KITS) ×2 IMPLANT
PENCIL SMOKE EVACUATOR COATED (MISCELLANEOUS) ×2 IMPLANT
STEM FEM ACTIS HIGH SZ2 (Stem) ×1 IMPLANT
STRIP CLOSURE SKIN 1/2X4 (GAUZE/BANDAGES/DRESSINGS) ×2 IMPLANT
SUT ETHIBOND NAB CT1 #1 30IN (SUTURE) ×2 IMPLANT
SUT MNCRL AB 4-0 PS2 18 (SUTURE) ×2 IMPLANT
SUT STRATAFIX 0 PDS 27 VIOLET (SUTURE) ×4
SUT VIC AB 2-0 CT1 27 (SUTURE) ×4
SUT VIC AB 2-0 CT1 TAPERPNT 27 (SUTURE) ×2 IMPLANT
SUTURE STRATFX 0 PDS 27 VIOLET (SUTURE) ×1 IMPLANT
SYR 50ML LL SCALE MARK (SYRINGE) IMPLANT
TRAY FOL W/BAG SLVR 16FR STRL (SET/KITS/TRAYS/PACK) IMPLANT
TRAY FOLEY W/BAG SLVR 16FR LF (SET/KITS/TRAYS/PACK) ×2
TUBE SUCTION HIGH CAP CLEAR NV (SUCTIONS) ×2 IMPLANT

## 2020-08-17 NOTE — Interval H&P Note (Signed)
History and Physical Interval Note:  08/17/2020 9:50 AM  Stephanie [name]  has presented today for surgery, with the diagnosis of Left hip osteoarthritis.  The various methods of treatment have been discussed with the patient and family. After consideration of risks, benefits and other options for treatment, the patient has consented to  Procedure(s) with comments: TOTAL HIP ARTHROPLASTY ANTERIOR APPROACH (Left) - as a surgical intervention.  The patient's history has been reviewed, patient examined, no change in status, stable for surgery.  I have reviewed the patient's chart and labs.  Questions were answered to the patient's satisfaction.     Homero Fellers Caedyn Raygoza

## 2020-08-17 NOTE — Discharge Instructions (Addendum)
Ollen Gross, MD Total Joint Specialist [facility] [address], [address] [place], [place] [address] [phone]  ANTERIOR APPROACH TOTAL HIP REPLACEMENT POSTOPERATIVE DIRECTIONS     Hip Rehabilitation, Guidelines Following Surgery  The results of a hip operation are greatly improved after range of motion and muscle strengthening exercises. Follow all safety measures which are given to protect your hip. If any of these exercises cause increased pain or swelling in your joint, decrease the amount until you are comfortable again. Then slowly increase the exercises. Call your caregiver if you have problems or questions.   BLOOD CLOT PREVENTION . Take a 10 mg Xarelto once a day for three weeks following surgery.  . You may resume your vitamins/supplements once you have discontinued the Xarelto.  HOME CARE INSTRUCTIONS  . Remove items at home which could result in a fall. This includes throw rugs or furniture in walking pathways.   ICE to the affected hip as frequently as 20-30 minutes an hour and then as needed for pain and swelling. Continue to use ice on the hip for pain and swelling from surgery. You may notice swelling that will progress down to the foot and ankle. This is normal after surgery. Elevate the leg when you are not up walking on it.    Continue to use the breathing machine which will help keep your temperature down.  It is common for your temperature to cycle up and down following surgery, especially at night when you are not up moving around and exerting yourself.  The breathing machine keeps your lungs expanded and your temperature down.  DIET You may resume your previous home diet once your are discharged from the hospital.  DRESSING / WOUND CARE / SHOWERING . You have an adhesive waterproof bandage over the incision. Leave this in place until your first follow-up appointment. Once you remove this you will not need to place another bandage.  . You  may begin showering 3 days following surgery, but do not submerge the incision under water.  ACTIVITY . For the first 3-5 days, it is important to rest and keep the operative leg elevated. You should, as a general rule, rest for 50 minutes and walk/stretch for 10 minutes per hour. After 5 days, you may slowly increase activity as tolerated.  Marland Kitchen Perform the exercises you were provided twice a day for about 15-20 minutes each session. Begin these 2 days following surgery. . Walk with your walker as instructed. Use the walker until you are comfortable transitioning to a cane. Walk with the cane in the opposite hand of the operative leg. You may discontinue the cane once you are comfortable and walking steadily. . Avoid periods of inactivity such as sitting longer than an hour when not asleep. This helps prevent blood clots.  . Do not drive a car for 6 weeks or until released by your surgeon.  . Do not drive while taking narcotics.  TED HOSE STOCKINGS Wear the elastic stockings on both legs for three weeks following surgery during the day. You may remove them at night while sleeping.  WEIGHT BEARING Weight bearing as tolerated with assist device (walker, cane, etc) as directed, use it as long as suggested by your surgeon or therapist, typically at least 4-6 weeks.  POSTOPERATIVE CONSTIPATION PROTOCOL Constipation - defined medically as fewer than three stools per week and severe constipation as less than one stool per week.  One of the most common issues patients have following surgery is constipation.  Even if you have a regular bowel pattern at home, your normal regimen is likely to be disrupted due to multiple reasons following surgery.  Combination of anesthesia, postoperative narcotics, change in appetite and fluid intake all can affect your bowels.  In order to avoid complications following surgery, here are some recommendations in order to help you during your recovery period.  . Colace  (docusate) - Pick up an over-the-counter form of Colace or another stool softener and take twice a day as long as you are requiring postoperative pain medications.  Take with a full glass of water daily.  If you experience loose stools or diarrhea, hold the colace until you stool forms back up.  If your symptoms do not get better within 1 week or if they get worse, check with your doctor. . Dulcolax (bisacodyl) - Pick up over-the-counter and take as directed by the product packaging as needed to assist with the movement of your bowels.  Take with a full glass of water.  Use this product as needed if not relieved by Colace only.  . MiraLax (polyethylene glycol) - Pick up over-the-counter to have on hand.  MiraLax is a solution that will increase the amount of water in your bowels to assist with bowel movements.  Take as directed and can mix with a glass of water, juice, soda, coffee, or tea.  Take if you go more than two days without a movement.Do not use MiraLax more than once per day. Call your doctor if you are still constipated or irregular after using this medication for 7 days in a row.  If you continue to have problems with postoperative constipation, please contact the office for further assistance and recommendations.  If you experience "the worst abdominal pain ever" or develop nausea or vomiting, please contact the office immediatly for further recommendations for treatment.  ITCHING  If you experience itching with your medications, try taking only a single pain pill, or even half a pain pill at a time.  You can also use Benadryl over the counter for itching or also to help with sleep.   MEDICATIONS See your medication summary on the "After Visit Summary" that the nursing staff will review with you prior to discharge.  You may have some home medications which will be placed on hold until you complete the course of blood thinner medication.  It is important for you to complete the blood thinner  medication as prescribed by your surgeon.  Continue your approved medications as instructed at time of discharge.  PRECAUTIONS If you experience chest pain or shortness of breath - call 911 immediately for transfer to the hospital emergency department.  If you develop a fever greater that 101 F, purulent drainage from wound, increased redness or drainage from wound, foul odor from the wound/dressing, or calf pain - CONTACT YOUR SURGEON.                                                   FOLLOW-UP APPOINTMENTS Make sure you keep all of your appointments after your operation with your surgeon and caregivers. You should call the office at the above phone number and make an appointment for approximately two weeks after the date of your surgery or on the date instructed by your surgeon outlined in the "After Visit Summary".  RANGE OF MOTION AND STRENGTHENING  EXERCISES  These exercises are designed to help you keep full movement of your hip joint. Follow your caregiver's or physical therapist's instructions. Perform all exercises about fifteen times, three times per day or as directed. Exercise both hips, even if you have had only one joint replacement. These exercises can be done on a training (exercise) mat, on the floor, on a table or on a bed. Use whatever works the best and is most comfortable for you. Use music or television while you are exercising so that the exercises are a pleasant break in your day. This will make your life better with the exercises acting as a break in routine you can look forward to.  . Lying on your back, slowly slide your foot toward your buttocks, raising your knee up off the floor. Then slowly slide your foot back down until your leg is straight again.  . Lying on your back spread your legs as far apart as you can without causing discomfort.  . Lying on your side, raise your upper leg and foot straight up from the floor as far as is comfortable. Slowly lower the leg and  repeat.  . Lying on your back, tighten up the muscle in the front of your thigh (quadriceps muscles). You can do this by keeping your leg straight and trying to raise your heel off the floor. This helps strengthen the largest muscle supporting your knee.  . Lying on your back, tighten up the muscles of your buttocks both with the legs straight and with the knee bent at a comfortable angle while keeping your heel on the floor.   POST-OPERATIVE OPIOID TAPER INSTRUCTIONS: . It is important to wean off of your opioid medication as soon as possible. If you do not need pain medication after your surgery it is ok to stop day one. Marland Kitchen Opioids include: o Codeine, Hydrocodone(Norco, Vicodin), Oxycodone(Percocet, oxycontin) and hydromorphone amongst others.  . Long term and even short term use of opiods can cause: o Increased pain response o Dependence o Constipation o Depression o Respiratory depression o And more.  . Withdrawal symptoms can include o Flu like symptoms o Nausea, vomiting o And more . Techniques to manage these symptoms o Hydrate well o Eat regular healthy meals o Stay active o Use relaxation techniques(deep breathing, meditating, yoga) . Do Not substitute Alcohol to help with tapering . If you have been on opioids for less than two weeks and do not have pain than it is ok to stop all together.  . Plan to wean off of opioids o This plan should start within one week post op of your joint replacement. o Maintain the same interval or time between taking each dose and first decrease the dose.  o Cut the total daily intake of opioids by one tablet each day o Next start to increase the time between doses. o The last dose that should be eliminated is the evening dose.     IF YOU ARE TRANSFERRED TO A SKILLED REHAB FACILITY If the patient is transferred to a skilled rehab facility following release from the hospital, a list of the current medications will be sent to the facility for  the patient to continue.  When discharged from the skilled rehab facility, please have the facility set up the patient's Home Health Physical Therapy prior to being released. Also, the skilled facility will be responsible for providing the patient with their medications at time of release from the facility to include their pain medication,  the muscle relaxants, and their blood thinner medication. If the patient is still at the rehab facility at time of the two week follow up appointment, the skilled rehab facility will also need to assist the patient in arranging follow up appointment in our office and any transportation needs.  MAKE SURE YOU:  . Understand these instructions.  . Get help right away if you are not doing well or get worse.    DENTAL ANTIBIOTICS:  In most cases prophylactic antibiotics for Dental procdeures after total joint surgery are not necessary.  Exceptions are as follows:  1. History of prior total joint infection  2. Severely immunocompromised (Organ Transplant, cancer chemotherapy, Rheumatoid biologic meds such as Humera)  3. Poorly controlled diabetes (A1C &gt; 8.0, blood glucose over 200)  If you have one of these conditions, contact your surgeon for an antibiotic prescription, prior to your dental procedure.    Pick up stool softner and laxative for home use following surgery while on pain medications. Do not submerge incision under water. Please use good hand washing techniques while changing dressing each day. May shower starting three days after surgery. Please use a clean towel to pat the incision dry following showers. Continue to use ice for pain and swelling after surgery. Do not use any lotions or creams on the incision until instructed by your surgeon.\  Information on my medicine - XARELTO (Rivaroxaban)  This medication education was reviewed with me or my healthcare representative as part of my discharge preparation.  The pharmacist that spoke  with me during my hospital stay was:   Why was Xarelto prescribed for you? Xarelto was prescribed for you to reduce the risk of blood clots forming after orthopedic surgery. The medical term for these abnormal blood clots is venous thromboembolism (VTE).  What do you need to know about xarelto ? Take your Xarelto ONCE DAILY at the same time every day. You may take it either with or without food.  If you have difficulty swallowing the tablet whole, you may crush it and mix in applesauce just prior to taking your dose.  Take Xarelto exactly as prescribed by your doctor and DO NOT stop taking Xarelto without talking to the doctor who prescribed the medication.  Stopping without other VTE prevention medication to take the place of Xarelto may increase your risk of developing a clot.  After discharge, you should have regular check-up appointments with your healthcare provider that is prescribing your Xarelto.    What do you do if you miss a dose? If you miss a dose, take it as soon as you remember on the same day then continue your regularly scheduled once daily regimen the next day. Do not take two doses of Xarelto on the same day.   Important Safety Information A possible side effect of Xarelto is bleeding. You should call your healthcare provider right away if you experience any of the following: ? Bleeding from an injury or your nose that does not stop. ? Unusual colored urine (red or dark brown) or unusual colored stools (red or black). ? Unusual bruising for unknown reasons. ? A serious fall or if you hit your head (even if there is no bleeding).  Some medicines may interact with Xarelto and might increase your risk of bleeding while on Xarelto. To help avoid this, consult your healthcare provider or pharmacist prior to using any new prescription or non-prescription medications, including herbals, vitamins, non-steroidal anti-inflammatory drugs (NSAIDs) and supplements.  This  website has  more information on Xarelto: VisitDestination.com.br.

## 2020-08-17 NOTE — Care Plan (Signed)
Ortho Bundle Case Management Note  Patient Details  Name: Stephanie [name] MRN: [id number] Date of Birth: 1960-05-18                  L THA on 08/17/20. DCP: Home with daughter. 1 story home with 1 step. DME: RW ordered through [employer]. Has 3in1. PT: HEP   DME Arranged:  Walker rolling DME Agency:  [employer]  HH Arranged:    HH Agency:     Additional Comments: Please contact me with any questions of if this plan should need to change.  Ennis Forts, RN,CCM [facility]  [phone] 08/17/2020, 2:00 PM

## 2020-08-17 NOTE — Transfer of Care (Signed)
Immediate Anesthesia Transfer of Care Note  Patient: Stephanie [name]  Procedure(s) Performed: TOTAL HIP ARTHROPLASTY ANTERIOR APPROACH (Left Hip)  Patient Location: PACU  Anesthesia Type:Spinal  Level of Consciousness: awake, alert , oriented and patient cooperative  Airway & Oxygen Therapy: Patient Spontanous Breathing and Patient connected to face mask oxygen  Post-op Assessment: Report given to RN and Post -op Vital signs reviewed and stable  Post vital signs: Reviewed and stable  Last Vitals:  Vitals Value Taken Time  BP 136/87 08/17/20 1315  Temp    Pulse 69 08/17/20 1315  Resp 17 08/17/20 1315  SpO2 100 % 08/17/20 1315  Vitals shown include unvalidated device data.  Last Pain:  Vitals:   08/17/20 0949  TempSrc: Oral  PainSc: 0-No pain      Patients Stated Pain Goal: 3 (08/17/20 0949)  Complications: No complications documented.

## 2020-08-17 NOTE — Anesthesia Procedure Notes (Signed)
Spinal  Patient location during procedure: OR Start time: 08/17/2020 11:15 AM End time: 08/17/2020 11:20 AM Reason for block: surgical anesthesia Staffing Performed: resident/CRNA  Resident/CRNA: Milford Cage, CRNA Preanesthetic Checklist Completed: patient identified, IV checked, site marked, risks and benefits discussed, surgical consent, monitors and equipment checked, pre-op evaluation and timeout performed Spinal Block Patient position: sitting Prep: DuraPrep Patient monitoring: heart rate, cardiac monitor, continuous pulse ox and blood pressure Approach: midline Location: L3-4 Injection technique: single-shot Needle Needle type: Sprotte and Pencan  Needle gauge: 24 G Needle length: 9 cm Assessment Sensory level: T6 Events: CSF return Additional Notes IV functioning, monitors applied to pt. Expiration date of kit checked and confirmed to be in date. Sterile prep and drape, hand hygiene and sterile gloved used. Pt was positioned and spine was prepped in sterile fashion. Skin was anesthetized with lidocaine. Free flow of clear CSF obtained prior to injecting local anesthetic into CSF x 1 attempt. Spinal needle aspirated freely following injection. Needle was carefully withdrawn, and pt tolerated procedure well. Loss of motor and sensory on exam post injection.

## 2020-08-17 NOTE — Anesthesia Preprocedure Evaluation (Addendum)
Anesthesia Evaluation    Reviewed: Allergy & Precautions, Patient's Chart, lab work & pertinent test results  History of Anesthesia Complications Negative for: history of anesthetic complications  Airway Mallampati: II  TM Distance: >3 FB Neck ROM: Full    Dental  (+) Dental Advisory Given, Teeth Intact   Pulmonary asthma , former smoker,    Pulmonary exam normal        Cardiovascular hypertension, Pt. on medications Normal cardiovascular exam     Neuro/Psych PSYCHIATRIC DISORDERS Depression negative neurological ROS     GI/Hepatic Neg liver ROS, GERD  Medicated and Controlled,  Endo/Other   Obesity K 3.2    Renal/GU negative Renal ROS     Musculoskeletal  (+) Arthritis , Osteoarthritis,    Abdominal   Peds  Hematology negative hematology ROS (+)   Anesthesia Other Findings Covid test negative   Reproductive/Obstetrics                            Anesthesia Physical Anesthesia Plan  ASA: II  Anesthesia Plan: Spinal   Post-op Pain Management:    Induction:   PONV Risk Score and Plan: 2 and Treatment may vary due to age or medical condition and Propofol infusion  Airway Management Planned: Natural Airway and Simple Face Mask  Additional Equipment: None  Intra-op Plan:   Post-operative Plan:   Informed Consent: I have reviewed the patients History and Physical, chart, labs and discussed the procedure including the risks, benefits and alternatives for the proposed anesthesia with the patient or authorized representative who has indicated his/her understanding and acceptance.       Plan Discussed with: CRNA and Anesthesiologist  Anesthesia Plan Comments: (Labs reviewed, platelets acceptable. Discussed risks and benefits of spinal, including spinal/epidural hematoma, infection, failed block, and PDPH. Patient expressed understanding and wished to proceed. )        Anesthesia Quick Evaluation

## 2020-08-17 NOTE — Op Note (Signed)
OPERATIVE REPORT- TOTAL HIP ARTHROPLASTY   PREOPERATIVE DIAGNOSIS: Osteoarthritis of the Left hip.   POSTOPERATIVE DIAGNOSIS: Osteoarthritis of the Left  hip.   PROCEDURE: Left total hip arthroplasty, anterior approach.   SURGEON: Ollen Gross, MD   ASSISTANT: Arther Abbott, PA-C  ANESTHESIA:  Spinal  ESTIMATED BLOOD LOSS:-200 mL    DRAINS: Hemovac x1.   COMPLICATIONS: None   CONDITION: PACU - hemodynamically stable.   BRIEF CLINICAL NOTE: Stephanie [name] is a 60 y.o. female who has advanced end-  stage arthritis of their Left  hip with progressively worsening pain and  dysfunction.The patient has failed nonoperative management and presents for  total hip arthroplasty.   PROCEDURE IN DETAIL: After successful administration of spinal  anesthetic, the traction boots for the [facility] bed were placed on both  feet and the patient was placed onto the [facility] bed, boots placed into the leg  holders. The Left hip was then isolated from the perineum with plastic  drapes and prepped and draped in the usual sterile fashion. ASIS and  greater trochanter were marked and a oblique incision was made, starting  at about 1 cm lateral and 2 cm distal to the ASIS and coursing towards  the anterior cortex of the femur. The skin was cut with a 10 blade  through subcutaneous tissue to the level of the fascia overlying the  tensor fascia lata muscle. The fascia was then incised in line with the  incision at the junction of the anterior third and posterior 2/3rd. The  muscle was teased off the fascia and then the interval between the TFL  and the rectus was developed. The Hohmann retractor was then placed at  the top of the femoral neck over the capsule. The vessels overlying the  capsule were cauterized and the fat on top of the capsule was removed.  A Hohmann retractor was then placed anterior underneath the rectus  femoris to give exposure to the entire anterior capsule. A T-shaped   capsulotomy was performed. The edges were tagged and the femoral head  was identified.       Osteophytes are removed off the superior acetabulum.  The femoral neck was then cut in situ with an oscillating saw. Traction  was then applied to the left lower extremity utilizing the [facility]  traction. The femoral head was then removed. Retractors were placed  around the acetabulum and then circumferential removal of the labrum was  performed. Osteophytes were also removed. Reaming starts at 45 mm to  medialize and  Increased in 2 mm increments to 47 mm. We reamed in  approximately 40 degrees of abduction, 20 degrees anteversion. A 48 mm  pinnacle acetabular shell was then impacted in anatomic position under  fluoroscopic guidance with excellent purchase. We did not need to place  any additional dome screws. A 28 mm neutral + 4 marathon liner was then  placed into the acetabular shell.       The femoral lift was then placed along the lateral aspect of the femur  just distal to the vastus ridge. The leg was  externally rotated and capsule  was stripped off the inferior aspect of the femoral neck down to the  level of the lesser trochanter, this was done with electrocautery. The femur was lifted after this was performed. The  leg was then placed in an extended and adducted position essentially delivering the femur. We also removed the capsule superiorly and the piriformis from the piriformis  fossa to gain excellent exposure of the  proximal femur. Rongeur was used to remove some cancellous bone to get  into the lateral portion of the proximal femur for placement of the  initial starter reamer. The starter broaches was placed  the starter broach  and was shown to go down the center of the canal. Broaching  with the Actis system was then performed starting at size 0  coursing  Up to size 2. A size 2 had excellent torsional and rotational  and axial stability. The trial high offset neck was then placed   with a 28 + 1.5 trial head. The hip was then reduced. We confirmed that  the stem was in the canal both on AP and lateral x-rays. It also has excellent sizing. The hip was reduced with outstanding stability through full extension and full external rotation.. AP pelvis was taken and the leg lengths were measured and found to be equal. Hip was then dislocated again and the femoral head and neck removed. The  femoral broach was removed. Size 2 Actis stem with a high offset  neck was then impacted into the femur following native anteversion. Has  excellent purchase in the canal. Excellent torsional and rotational and  axial stability. It is confirmed to be in the canal on AP and lateral  fluoroscopic views. The 28 + 1.5 ceramic head was placed and the hip  reduced with outstanding stability. Again AP pelvis was taken and it  confirmed that the leg lengths were equal. The wound was then copiously  irrigated with saline solution and the capsule reattached and repaired  with Ethibond suture. 30 ml of .25% Bupivicaine was  injected into the capsule and into the edge of the tensor fascia lata as well as subcutaneous tissue. The fascia overlying the tensor fascia lata was then closed with a running #1 V-Loc. Subcu was closed with interrupted 2-0 Vicryl and subcuticular running 4-0 Monocryl. Incision was cleaned  and dried. Steri-Strips and a bulky sterile dressing applied. Hemovac  drain was hooked to suction and then the patient was awakened and transported to  recovery in stable condition.        Please note that a surgical assistant was a medical necessity for this procedure to perform it in a safe and expeditious manner. Assistant was necessary to provide appropriate retraction of vital neurovascular structures and to prevent femoral fracture and allow for anatomic placement of the prosthesis.  Gaynelle Arabian, M.D.

## 2020-08-17 NOTE — Evaluation (Signed)
Physical Therapy Evaluation Patient Details Name: Stephanie [name] MRN: [id number] DOB: 13-Apr-1961 Today's Date: 08/17/2020   History of Present Illness  Patient is 60 y.o. female s/p Lt THA anterior approach on 08/17/20 with PMH significant for HTN, GERD, Asthma, Depression, OA, Rt THA in 2018.    Clinical Impression  Stephanie [name] is a 60 y.o. female POD 0 s/p Lt THA. Patient reports independence with mobility at baseline. Patient is now limited by functional impairments (see PT problem list below) and requires min assist/guard for transfers and gait with RW. Patient was able to ambulate ~40 feet with RW and min guard and cues. Patient instructed in exercise to facilitate ROM and circulation to manage edema and reduce risk of DVT. Patient will benefit from continued skilled PT interventions to address impairments and progress towards PLOF. Acute PT will follow to progress mobility and stair training in preparation for safe discharge home with assist from daughter.     Follow Up Recommendations Follow surgeon's recommendation for DC plan and follow-up therapies;Outpatient PT    Equipment Recommendations  Rolling walker with 5" wheels    Recommendations for Other Services       Precautions / Restrictions Precautions Precautions: Fall Restrictions Weight Bearing Restrictions: No Other Position/Activity Restrictions: WBAT      Mobility  Bed Mobility Overal bed mobility: Needs Assistance Bed Mobility: Supine to Sit     Supine to sit: HOB elevated;Min assist     General bed mobility comments: Cues to use bed rails for pivot, pt taking extra time and assist needed for Lt LE to EOB.    Transfers Overall transfer level: Needs assistance Equipment used: Rolling walker (2 wheeled) Transfers: Sit to/from Stand Sit to Stand: Min assist         General transfer comment: cues for safe technique/hand placement for power up, light assist to complete  rise.  Ambulation/Gait Ambulation/Gait assistance: Min guard Gait Distance (Feet): 40 Feet Assistive device: Rolling walker (2 wheeled) Gait Pattern/deviations: Step-to pattern;Decreased stride length;Decreased weight shift to left Gait velocity: decr   General Gait Details: cues for step pattern and proximity to RW, pt maintained throughout with min guard for safety.  Stairs            Wheelchair Mobility    Modified Rankin (Stroke Patients Only)       Balance Overall balance assessment: Needs assistance Sitting-balance support: Feet supported Sitting balance-Leahy Scale: Good     Standing balance support: During functional activity;Bilateral upper extremity supported Standing balance-Leahy Scale: Fair                               Pertinent Vitals/Pain Pain Assessment: 0-10 Pain Score: 5  Pain Location: Lt hip Pain Descriptors / Indicators: Aching;Discomfort;Dull;Burning Pain Intervention(s): Limited activity within patient's tolerance;Monitored during session;Repositioned;Ice applied    Home Living Family/patient expects to be discharged to:: Private residence Living Arrangements: Children Available Help at Discharge: Family Type of Home: House Home Access: Stairs to enter Entrance Stairs-Rails: None Entrance Stairs-Number of Steps: 1 Home Layout: One level Home Equipment: Environmental consultant - 4 wheels;Cane - single point;Bedside commode;Grab bars - toilet;Shower seat - built in      Prior Function Level of Independence: Independent               Hand Dominance   Dominant Hand: Right    Extremity/Trunk Assessment   Upper Extremity Assessment Upper Extremity Assessment: Overall WFL for tasks  assessed    Lower Extremity Assessment Lower Extremity Assessment: Overall WFL for tasks assessed    Cervical / Trunk Assessment Cervical / Trunk Assessment: Normal  Communication   Communication: No difficulties  Cognition Arousal/Alertness:  Awake/alert Behavior During Therapy: WFL for tasks assessed/performed Overall Cognitive Status: Within Functional Limits for tasks assessed                                        General Comments      Exercises Total Joint Exercises Ankle Circles/Pumps: AROM;Both;20 reps;Seated Quad Sets: AROM;Both;5 reps;Seated   Assessment/Plan    PT Assessment Patient needs continued PT services  PT Problem List Decreased strength;Decreased range of motion;Decreased activity tolerance;Decreased balance;Decreased mobility;Decreased knowledge of use of DME;Decreased safety awareness;Decreased knowledge of precautions       PT Treatment Interventions DME instruction;Stair training;Gait training;Functional mobility training;Therapeutic activities;Therapeutic exercise;Balance training;Patient/family education    PT Goals (Current goals can be found in the Care Plan section)  Acute Rehab PT Goals Patient Stated Goal: get back to playing with grandchildren PT Goal Formulation: With patient Time For Goal Achievement: 08/24/20 Potential to Achieve Goals: Good    Frequency 7X/week   Barriers to discharge        Co-evaluation               AM-PAC PT "6 Clicks" Mobility  Outcome Measure Help needed turning from your back to your side while in a flat bed without using bedrails?: A Little Help needed moving from lying on your back to sitting on the side of a flat bed without using bedrails?: A Little Help needed moving to and from a bed to a chair (including a wheelchair)?: A Little Help needed standing up from a chair using your arms (e.g., wheelchair or bedside chair)?: A Little Help needed to walk in hospital room?: A Little Help needed climbing 3-5 steps with a railing? : A Little 6 Click Score: 18    End of Session Equipment Utilized During Treatment: Gait belt Activity Tolerance: Patient tolerated treatment well Patient left: in chair;with call bell/phone within  reach;with chair alarm set;with family/visitor present Nurse Communication: Mobility status PT Visit Diagnosis: Muscle weakness (generalized) (M62.81);Difficulty in walking, not elsewhere classified (R26.2)    Time: 6237-6283 PT Time Calculation (min) (ACUTE ONLY): 28 min   Charges:   PT Evaluation $PT Eval Low Complexity: 1 Low PT Treatments $Gait Training: 8-22 mins        Wynn Maudlin, DPT Acute Rehabilitation Services Office [phone] Pager [phone]    Stephanie [name] 08/17/2020, 5:47 PM

## 2020-08-17 NOTE — Anesthesia Postprocedure Evaluation (Signed)
Anesthesia Post Note  Patient: Stephanie [name]  Procedure(s) Performed: TOTAL HIP ARTHROPLASTY ANTERIOR APPROACH (Left Hip)     Patient location during evaluation: PACU Anesthesia Type: Spinal Level of consciousness: awake and alert Pain management: pain level controlled Vital Signs Assessment: post-procedure vital signs reviewed and stable Respiratory status: spontaneous breathing and respiratory function stable Cardiovascular status: blood pressure returned to baseline and stable Postop Assessment: spinal receding and no apparent nausea or vomiting Anesthetic complications: no   No complications documented.  Last Vitals:  Vitals:   08/17/20 1438 08/17/20 1654  BP: 117/80 123/82  Pulse: 72 79  Resp: 15 16  Temp:  36.9 C  SpO2: 99% 100%    Last Pain:  Vitals:   08/17/20 1654  TempSrc: Oral  PainSc:                  Beryle Lathe

## 2020-08-18 ENCOUNTER — Encounter (HOSPITAL_COMMUNITY): Payer: Self-pay | Admitting: Orthopedic Surgery

## 2020-08-18 DIAGNOSIS — M1612 Unilateral primary osteoarthritis, left hip: Secondary | ICD-10-CM | POA: Diagnosis not present

## 2020-08-18 DIAGNOSIS — Z79899 Other long term (current) drug therapy: Secondary | ICD-10-CM | POA: Diagnosis not present

## 2020-08-18 DIAGNOSIS — Z96642 Presence of left artificial hip joint: Secondary | ICD-10-CM | POA: Diagnosis not present

## 2020-08-18 DIAGNOSIS — Z91040 Latex allergy status: Secondary | ICD-10-CM | POA: Diagnosis not present

## 2020-08-18 DIAGNOSIS — Z87891 Personal history of nicotine dependence: Secondary | ICD-10-CM | POA: Diagnosis not present

## 2020-08-18 DIAGNOSIS — I10 Essential (primary) hypertension: Secondary | ICD-10-CM | POA: Diagnosis not present

## 2020-08-18 DIAGNOSIS — J45909 Unspecified asthma, uncomplicated: Secondary | ICD-10-CM | POA: Diagnosis not present

## 2020-08-18 LAB — CBC
HCT: 34.4 % — ABNORMAL LOW (ref 36.0–46.0)
Hemoglobin: 11.3 g/dL — ABNORMAL LOW (ref 12.0–15.0)
MCH: 29.9 pg (ref 26.0–34.0)
MCHC: 32.8 g/dL (ref 30.0–36.0)
MCV: 91 fL (ref 80.0–100.0)
Platelets: 227 10*3/uL (ref 150–400)
RBC: 3.78 MIL/uL — ABNORMAL LOW (ref 3.87–5.11)
RDW: 14.1 % (ref 11.5–15.5)
WBC: 12.3 10*3/uL — ABNORMAL HIGH (ref 4.0–10.5)
nRBC: 0 % (ref 0.0–0.2)

## 2020-08-18 LAB — BASIC METABOLIC PANEL
Anion gap: 10 (ref 5–15)
BUN: 6 mg/dL (ref 6–20)
CO2: 27 mmol/L (ref 22–32)
Calcium: 8.4 mg/dL — ABNORMAL LOW (ref 8.9–10.3)
Chloride: 102 mmol/L (ref 98–111)
Creatinine, Ser: 0.51 mg/dL (ref 0.44–1.00)
GFR, Estimated: >60 mL/min (ref >60)
Glucose, Bld: 121 mg/dL — ABNORMAL HIGH (ref 70–99)
Potassium: 3.1 mmol/L — ABNORMAL LOW (ref 3.5–5.1)
Sodium: 139 mmol/L (ref 135–145)

## 2020-08-18 MED ORDER — HYDROMORPHONE HCL 2 MG PO TABS: 2.0000 mg | ORAL_TABLET | Freq: Four times a day (QID) | ORAL | 0 refills | Status: DC | PRN

## 2020-08-18 MED ORDER — RIVAROXABAN 10 MG PO TABS: 10.0000 mg | ORAL_TABLET | Freq: Every day | ORAL | 0 refills | Status: DC

## 2020-08-18 MED ORDER — POTASSIUM CHLORIDE CRYS ER 20 MEQ PO TBCR
40.0000 meq | EXTENDED_RELEASE_TABLET | ORAL | Status: AC
  Administered 2020-08-18 (×2): 40 meq via ORAL
  Filled 2020-08-18 (×2): qty 2

## 2020-08-18 MED ORDER — METHOCARBAMOL 500 MG PO TABS: 500.0000 mg | ORAL_TABLET | Freq: Four times a day (QID) | ORAL | 0 refills | Status: DC | PRN

## 2020-08-18 NOTE — Progress Notes (Signed)
   Subjective: 1 Day Post-Op Procedure(s) (LRB): TOTAL HIP ARTHROPLASTY ANTERIOR APPROACH (Left) Patient reports pain as moderate.   Patient seen in rounds by Dr. Lequita Halt. Patient is well, and has had no acute complaints or problems. No acute overnight events. Patient denies SOB, chest pain, or calf pain. Ambulated 40 feet with PT yesterday. Foley pulled this am.  We will continue therapy today.   Objective: Vital signs in last 24 hours: Temp:  [97.5 F (36.4 C)-98.5 F (36.9 C)] 98.2 F (36.8 C) (04/07 0439) Pulse Rate:  [68-87] 68 (04/07 0439) Resp:  [13-20] 18 (04/07 0439) BP: (105-147)/(73-87) 125/79 (04/07 0439) SpO2:  [97 %-100 %] 97 % (04/07 0439) Weight:  [85.3 kg] 85.3 kg (04/06 1438)  Intake/Output from previous day:  Intake/Output Summary (Last 24 hours) at 08/18/2020 0709 Last data filed at 08/18/2020 0545 Gross per 24 hour  Intake 4009.85 ml  Output 3975 ml  Net 34.85 ml     Intake/Output this shift: No intake/output data recorded.  Labs: Recent Labs    08/18/20 0304  HGB 11.3*   Recent Labs    08/18/20 0304  WBC 12.3*  RBC 3.78*  HCT 34.4*  PLT 227   Recent Labs    08/17/20 0939 08/18/20 0304  NA 139 139  K 3.2* 3.1*  CL 101 102  CO2 28 27  BUN 11 6  CREATININE 0.70 0.51  GLUCOSE 126* 121*  CALCIUM 9.2 8.4*   No results for input(s): LABPT, INR in the last 72 hours.  Exam: General - Patient is Alert and Oriented Extremity - Neurologically intact Neurovascular intact Intact pulses distally Dorsiflexion/Plantar flexion intact Dressing - dressing C/D/I Motor Function - intact, moving foot and toes well on exam.   Past Medical History:  Diagnosis Date  . Allergic rhinitis   . Arthritis   . Asthma   . Depression   . GERD (gastroesophageal reflux disease)   . Heart murmur   . Hypertension   . Pneumonia    hx of x 3     Assessment/Plan: 1 Day Post-Op Procedure(s) (LRB): TOTAL HIP ARTHROPLASTY ANTERIOR APPROACH (Left) Active  Problems:   Primary osteoarthritis of left hip  Estimated body mass index is 32.27 kg/m as calculated from the following:   Height as of this encounter: 5\' 4"  (1.626 m).   Weight as of this encounter: 85.3 kg. Advance diet Up with therapy  DVT Prophylaxis - Xarelto and TED hose Weight bearing as tolerated. Continue therapy.  Patient's potassium 3.1 this morning. One dose of potassium ordered.   Plan for one session of PT, two if necessary, and if meeting goals, will plan for discharge this afternoon.   Patient to follow up with Dr. in two weeks.   Plan is to go Home after hospital stay.  The PDMP database was reviewed today prior to any opioid medications being prescribed to this patient.   Lequita Halt, MBA, PA-C Orthopedic Surgery [phone] 08/18/2020, 7:09 AM

## 2020-08-18 NOTE — TOC Transition Note (Signed)
Transition of Care [facility]) - CM/SW Discharge Note  Patient Details  Name: KONNER WARRIOR MRN: [id number] Date of Birth: 01-Jan-1961  [facility] CM/SW Contact:  Sherie Don, LCSW Phone Number: 08/18/2020, 9:19 AM  Clinical Narrative: Patient is expected to discharge home today. CSW met with patient to review discharge plan. Patient will discharge home with a home exercise program (HEP) and needs a rolling walker. Patient has a 3N1 at home. [employer] to deliver RW to patient's room. TOC signing off.  Final next level of care: Home/Self Care Barriers to Discharge: No Barriers Identified  Patient Goals and CMS Choice Patient states their goals for this hospitalization and ongoing recovery are:: Discharge home with HEP and rolling walker CMS Medicare.gov Compare Post Acute Care list provided to:: Patient Choice offered to / list presented to : Patient  Discharge Plan and Services        DME Arranged: Walker rolling DME Agency: [employer] Representative spoke with at DME Agency: Pre-arranged in orthopedist's office  Readmission Risk Interventions No flowsheet data found.

## 2020-08-18 NOTE — Progress Notes (Signed)
Physical Therapy Treatment Patient Details Name: Stephanie [name] MRN: [id number] DOB: 02-06-1961 Today's Date: 08/18/2020    History of Present Illness Patient is 60 y.o. female s/p Lt THA anterior approach on 08/17/20 with PMH significant for HTN, GERD, Asthma, Depression, OA, Rt THA in 2018.    PT Comments    Patient making good progress today and increased ambulation distance to ~70'. Pt mobilize very slow initially and improved step length and pace as she progressed. Lt hip "tightness" limited pt's ability to advance LE forward and pt circumducting slightly. Cues to increase step length on Rt and dorsiflex Lt toes improved foot clearance and reduced compensatory gait strategies. Initiated HEP and pt completed seated/supine exercises in recliner. Will follow up for additional session to complete HEP education and finalize gait/stair training for safe discharge home.    Follow Up Recommendations  Follow surgeon's recommendation for DC plan and follow-up therapies;Outpatient PT     Equipment Recommendations  Rolling walker with 5" wheels    Recommendations for Other Services       Precautions / Restrictions Precautions Precautions: Fall Restrictions Weight Bearing Restrictions: No Other Position/Activity Restrictions: WBAT    Mobility  Bed Mobility               General bed mobility comments: pt OOB in recliner    Transfers Overall transfer level: Needs assistance Equipment used: Rolling walker (2 wheeled) Transfers: Sit to/from Stand Sit to Stand: Min guard         General transfer comment: min cues for safe hand placement for power up. pt using bil UE's from armrest to rise and both to slow lowering to sit. guarding for safety.  Ambulation/Gait Ambulation/Gait assistance: Min guard Gait Distance (Feet): 70 Feet Assistive device: Rolling walker (2 wheeled) Gait Pattern/deviations: Step-to pattern;Decreased stride length;Decreased weight shift to left Gait  velocity: decr   General Gait Details: min cues at start for step pattern, pt leading with Rt due to pain with Lt LE advancement. pt improved step/stride length throughout and maintained safe proximity to RW throughout.   Stairs             Wheelchair Mobility    Modified Rankin (Stroke Patients Only)       Balance Overall balance assessment: Needs assistance Sitting-balance support: Feet supported Sitting balance-Leahy Scale: Good     Standing balance support: During functional activity;Bilateral upper extremity supported Standing balance-Leahy Scale: Fair                              Cognition Arousal/Alertness: Awake/alert Behavior During Therapy: WFL for tasks assessed/performed Overall Cognitive Status: Within Functional Limits for tasks assessed                                        Exercises Total Joint Exercises Ankle Circles/Pumps: AROM;Both;20 reps;Seated Quad Sets: AROM;Both;Seated;10 reps Short Arc Quad: AROM;Both;Seated;5 reps Heel Slides: AROM;Both;Seated;5 reps Hip ABduction/ADduction: AROM;Both;Seated;5 reps    General Comments        Pertinent Vitals/Pain Pain Assessment: 0-10 Pain Score: 4  Pain Location: Lt hip Pain Descriptors / Indicators: Aching;Discomfort;Dull;Burning Pain Intervention(s): Limited activity within patient's tolerance;Premedicated before session;Monitored during session;Repositioned;Ice applied    Home Living                      Prior Function  PT Goals (current goals can now be found in the care plan section) Acute Rehab PT Goals Patient Stated Goal: get back to playing with grandchildren PT Goal Formulation: With patient Time For Goal Achievement: 08/24/20 Potential to Achieve Goals: Good Progress towards PT goals: Progressing toward goals    Frequency    7X/week      PT Plan Current plan remains appropriate    Co-evaluation               AM-PAC PT "6 Clicks" Mobility   Outcome Measure  Help needed turning from your back to your side while in a flat bed without using bedrails?: A Little Help needed moving from lying on your back to sitting on the side of a flat bed without using bedrails?: A Little Help needed moving to and from a bed to a chair (including a wheelchair)?: A Little Help needed standing up from a chair using your arms (e.g., wheelchair or bedside chair)?: A Little Help needed to walk in hospital room?: A Little Help needed climbing 3-5 steps with a railing? : A Little 6 Click Score: 18    End of Session Equipment Utilized During Treatment: Gait belt Activity Tolerance: Patient tolerated treatment well Patient left: in chair;with call bell/phone within reach;with chair alarm set Nurse Communication: Mobility status PT Visit Diagnosis: Muscle weakness (generalized) (M62.81);Difficulty in walking, not elsewhere classified (R26.2)     Time: 7591-6384 PT Time Calculation (min) (ACUTE ONLY): 43 min  Charges:  $Gait Training: 23-37 mins $Therapeutic Exercise: 8-22 mins                     Wynn Maudlin, DPT Acute Rehabilitation Services Office [phone] Pager [phone]     Anitra Lauth 08/18/2020, 12:11 PM

## 2020-08-18 NOTE — Progress Notes (Signed)
Physical Therapy Treatment Patient Details Name: Stephanie [name] MRN: [id number] DOB: 08/03/1960 Today's Date: 08/18/2020    History of Present Illness Patient is 60 y.o. female s/p Lt THA anterior approach on 08/17/20 with PMH significant for HTN, GERD, Asthma, Depression, OA, Rt THA in 2018.    PT Comments    Patient with reduced pain this session and progressed gait training to stair mobility. She demonstrated good carryover for safe use of RW and improved Lt step length with cues to dorsiflex Lt foot and increase Rt step length. Patient able to ambulate ~ 81' with min guard/supervision and completed single step up using RW; pt verbalized safe guarding position for family to provide. Completed additional HEP exercises and all questions addressed. Patient is at safe level for discharge home.     Follow Up Recommendations  Follow surgeon's recommendation for DC plan and follow-up therapies;Outpatient PT     Equipment Recommendations  Rolling walker with 5" wheels    Recommendations for Other Services       Precautions / Restrictions Precautions Precautions: Fall Restrictions Weight Bearing Restrictions: No Other Position/Activity Restrictions: WBAT    Mobility  Bed Mobility               General bed mobility comments: pt in bathroom at start of session, ended in recliner.    Transfers Overall transfer level: Needs assistance Equipment used: Rolling walker (2 wheeled) Transfers: Sit to/from Stand Sit to Stand: Supervision;Min guard         General transfer comment: good carryove for safe reach back and hand placement for power up. no assist needed. pt requires extra time to rise due to hip pain.  Ambulation/Gait Ambulation/Gait assistance: Min guard Gait Distance (Feet): 80 Feet Assistive device: Rolling walker (2 wheeled) Gait Pattern/deviations: Step-to pattern;Decreased stride length;Decreased weight shift to left Gait velocity: decr   General Gait  Details: cues for dorsiflexion on Lt foot and increased step length on Rt to facilitate Lt LE step lenght. pt improved step pattern throughout. no overt LOB. increased velocity throughout.   Stairs Stairs: Yes Stairs assistance: Min guard Stair Management: No rails;Step to pattern;Forwards;With walker Number of Stairs: 1 General stair comments: cues for safe step sequence and walker position "up with good, down with bad". No overt LOB noted and pt verbalized safe understanding of family assist and guarding position.   Wheelchair Mobility    Modified Rankin (Stroke Patients Only)       Balance Overall balance assessment: Needs assistance Sitting-balance support: Feet supported Sitting balance-Leahy Scale: Good     Standing balance support: During functional activity;Bilateral upper extremity supported Standing balance-Leahy Scale: Fair                              Cognition Arousal/Alertness: Awake/alert Behavior During Therapy: WFL for tasks assessed/performed Overall Cognitive Status: Within Functional Limits for tasks assessed                                        Exercises Total Joint Exercises Ankle Circles/Pumps: AROM;Both;20 reps;Seated Short Arc Quad: AROM;Both;Seated;5 reps Long Arc Quad: AROM;Left;10 reps;Seated    General Comments        Pertinent Vitals/Pain Pain Assessment: 0-10 Pain Score: 4  Pain Location: Lt hip Pain Descriptors / Indicators: Aching;Discomfort;Dull;Burning Pain Intervention(s): Limited activity within patient's tolerance;Monitored during session;Repositioned;RN gave pain  meds during session    Home Living                      Prior Function            PT Goals (current goals can now be found in the care plan section) Acute Rehab PT Goals Patient Stated Goal: get back to playing with grandchildren PT Goal Formulation: With patient Time For Goal Achievement: 08/24/20 Potential to Achieve  Goals: Good Progress towards PT goals: Progressing toward goals    Frequency    7X/week      PT Plan Current plan remains appropriate    Co-evaluation              AM-PAC PT "6 Clicks" Mobility   Outcome Measure  Help needed turning from your back to your side while in a flat bed without using bedrails?: A Little Help needed moving from lying on your back to sitting on the side of a flat bed without using bedrails?: A Little Help needed moving to and from a bed to a chair (including a wheelchair)?: A Little Help needed standing up from a chair using your arms (e.g., wheelchair or bedside chair)?: A Little Help needed to walk in hospital room?: A Little Help needed climbing 3-5 steps with a railing? : A Little 6 Click Score: 18    End of Session Equipment Utilized During Treatment: Gait belt Activity Tolerance: Patient tolerated treatment well Patient left: in chair;with call bell/phone within reach;with chair alarm set Nurse Communication: Mobility status PT Visit Diagnosis: Muscle weakness (generalized) (M62.81);Difficulty in walking, not elsewhere classified (R26.2)     Time: 6147-0929 PT Time Calculation (min) (ACUTE ONLY): 32 min  Charges:  $Gait Training: 8-22 mins $Therapeutic Exercise: 8-22 mins                     Wynn Maudlin, DPT Acute Rehabilitation Services Office [phone] Pager [phone]     Stephanie [name] 08/18/2020, 3:09 PM

## 2020-08-19 DIAGNOSIS — M1612 Unilateral primary osteoarthritis, left hip: Secondary | ICD-10-CM | POA: Diagnosis not present

## 2020-08-22 NOTE — Discharge Summary (Signed)
Physician Discharge Summary   Patient ID: Stephanie [name] MRN: [id number] DOB/AGE: Jun 13, 1960 60 y.o.  Admit date: 08/17/2020 Discharge date: 08/18/2020  Primary Diagnosis: Osteoarthritis of Left Hip  Admission Diagnoses:  Past Medical History:  Diagnosis Date  . Allergic rhinitis   . Arthritis   . Asthma   . Depression   . GERD (gastroesophageal reflux disease)   . Heart murmur   . Hypertension   . Pneumonia    hx of x 3    Discharge Diagnoses:   Active Problems:   Primary osteoarthritis of left hip  Estimated body mass index is 32.27 kg/m as calculated from the following:   Height as of this encounter: _0  (1.626 m).   Weight as of this encounter: 85.3 kg.  Procedure:  Procedure(s) (LRB): TOTAL HIP ARTHROPLASTY ANTERIOR APPROACH (Left)   Consults: None  HPI: Stephanie [name] is a 60 y.o. female who has advanced end-  stage arthritis of their Left  hip with progressively worsening pain and  dysfunction.The patient has failed nonoperative management and presents for  total hip arthroplasty.   Laboratory Data: Admission on 08/17/2020, Discharged on 08/18/2020  Component Date Value Ref Range Status  . Sodium 08/17/2020 139  135 - 145 mmol/L Final  . Potassium 08/17/2020 3.2* 3.5 - 5.1 mmol/L Final  . Chloride 08/17/2020 101  98 - 111 mmol/L Final  . CO2 08/17/2020 28  22 - 32 mmol/L Final  . Glucose, Bld 08/17/2020 126* 70 - 99 mg/dL Final   Glucose reference range applies only to samples taken after fasting for at least 8 hours.  . BUN 08/17/2020 11  6 - 20 mg/dL Final  . Creatinine, Ser 08/17/2020 0.70  0.44 - 1.00 mg/dL Final  . Calcium 08/17/2020 9.2  8.9 - 10.3 mg/dL Final  . GFR, Estimated 08/17/2020 >60  >60 mL/min Final   Comment: (NOTE) Calculated using the CKD-EPI Creatinine Equation (2021)   . Anion gap 08/17/2020 10  5 - 15 Final   Performed at [facility], [place] [address] [place], [place] [address]  . WBC 08/18/2020 12.3* 4.0  - 10.5 K/uL Final  . RBC 08/18/2020 3.78* 3.87 - 5.11 MIL/uL Final  . Hemoglobin 08/18/2020 11.3* 12.0 - 15.0 g/dL Final  . HCT 08/18/2020 34.4* 36.0 - 46.0 % Final  . MCV 08/18/2020 91.0  80.0 - 100.0 fL Final  . MCH 08/18/2020 29.9  26.0 - 34.0 pg Final  . MCHC 08/18/2020 32.8  30.0 - 36.0 g/dL Final  . RDW 08/18/2020 14.1  11.5 - 15.5 % Final  . Platelets 08/18/2020 227  150 - 400 K/uL Final  . nRBC 08/18/2020 0.0  0.0 - 0.2 % Final   Performed at [facility], [place] [address], [place], [place] [address]  . Sodium 08/18/2020 139  135 - 145 mmol/L Final  . Potassium 08/18/2020 3.1* 3.5 - 5.1 mmol/L Final  . Chloride 08/18/2020 102  98 - 111 mmol/L Final  . CO2 08/18/2020 27  22 - 32 mmol/L Final  . Glucose, Bld 08/18/2020 121* 70 - 99 mg/dL Final   Glucose reference range applies only to samples taken after fasting for at least 8 hours.  . BUN 08/18/2020 6  6 - 20 mg/dL Final  . Creatinine, Ser 08/18/2020 0.51  0.44 - 1.00 mg/dL Final  . Calcium 08/18/2020 8.4* 8.9 - 10.3 mg/dL Final  . GFR, Estimated 08/18/2020 >60  >60 mL/min Final   Comment: (NOTE) Calculated using  the CKD-EPI Creatinine Equation (2021)   . Anion gap 08/18/2020 10  5 - 15 Final   Performed at [facility], [place] [address] [place], [place] [address]  Hospital Outpatient Visit on 08/15/2020  Component Date Value Ref Range Status  . SARS Coronavirus 2 08/15/2020 NEGATIVE  NEGATIVE Final   Comment: (NOTE) SARS-CoV-2 target nucleic acids are NOT DETECTED.  The SARS-CoV-2 RNA is generally detectable in upper and lower respiratory specimens during the acute phase of infection. Negative results do not preclude SARS-CoV-2 infection, do not rule out co-infections with other pathogens, and should not be used as the sole basis for treatment or other patient management decisions. Negative results must be combined with clinical observations, patient history, and epidemiological  information. The expected result is Negative.  Fact Sheet for Patients: SugarRoll.be  Fact Sheet for Healthcare Providers: https://www.woods-mathews.com/  This test is not yet approved or cleared by the [place] FDA and  has been authorized for detection and/or diagnosis of SARS-CoV-2 by FDA under an Emergency Use Authorization (EUA). This EUA will remain  in effect (meaning this test can be used) for the duration of the COVID-19 declaration under Se                          ction 564(b)(1) of the Act, 21 U.S.C. section 360bbb-3(b)(1), unless the authorization is terminated or revoked sooner.  Performed at [facility], [place] [address], [place], [place] [address]   Hospital Outpatient Visit on 08/08/2020  Component Date Value Ref Range Status  . WBC 08/08/2020 6.8  4.0 - 10.5 K/uL Final  . RBC 08/08/2020 4.77  3.87 - 5.11 MIL/uL Final  . Hemoglobin 08/08/2020 14.3  12.0 - 15.0 g/dL Final  . HCT 08/08/2020 42.3  36.0 - 46.0 % Final  . MCV 08/08/2020 88.7  80.0 - 100.0 fL Final  . MCH 08/08/2020 30.0  26.0 - 34.0 pg Final  . MCHC 08/08/2020 33.8  30.0 - 36.0 g/dL Final  . RDW 08/08/2020 13.8  11.5 - 15.5 % Final  . Platelets 08/08/2020 289  150 - 400 K/uL Final  . nRBC 08/08/2020 0.0  0.0 - 0.2 % Final   Performed at [facility], [place] [address], [place],  [address]  . Sodium 08/08/2020 136  135 - 145 mmol/L Final  . Potassium 08/08/2020 2.8* 3.5 - 5.1 mmol/L Final  . Chloride 08/08/2020 98  98 - 111 mmol/L Final  . CO2 08/08/2020 27  22 - 32 mmol/L Final  . Glucose, Bld 08/08/2020 95  70 - 99 mg/dL Final   Glucose reference range applies only to samples taken after fasting for at least 8 hours.  . BUN 08/08/2020 11  6 - 20 mg/dL Final  . Creatinine, Ser 08/08/2020 0.63  0.44 - 1.00 mg/dL Final  . Calcium 08/08/2020 9.4  8.9 - 10.3 mg/dL Final  . Total Protein 08/08/2020 8.0  6.5 - 8.1 g/dL Final  .  Albumin 08/08/2020 4.3  3.5 - 5.0 g/dL Final  . AST 08/08/2020 19  15 - 41 U/L Final  . ALT 08/08/2020 18  0 - 44 U/L Final  . Alkaline Phosphatase 08/08/2020 83  38 - 126 U/L Final  . Total Bilirubin 08/08/2020 1.2  0.3 - 1.2 mg/dL Final  . GFR, Estimated 08/08/2020 >60  >60 mL/min Final   Comment: (NOTE) Calculated using the CKD-EPI Creatinine Equation (2021)   . Anion gap  08/08/2020 11  5 - 15 Final   Performed at [facility], [place] [address], [place], [place] [address]  . Prothrombin Time 08/08/2020 12.5  11.4 - 15.2 seconds Final  . INR 08/08/2020 1.0  0.8 - 1.2 Final   Comment: (NOTE) INR goal varies based on device and disease states. Performed at [facility], [place] [address], [place], [place] [address]   . aPTT 08/08/2020 33  24 - 36 seconds Final   Performed at [facility], [place] [address], [place], [place] [address]  . ABO/RH(D) 08/08/2020 O POS   Final  . Antibody Screen 08/08/2020 NEG   Final  . Sample Expiration 08/08/2020 08/20/2020,2359   Final  . Extend sample reason 08/08/2020    Final                   Value:NO TRANSFUSIONS OR PREGNANCY IN THE PAST 3 MONTHS Performed at [place] [address] [place], [place] [address]   . MRSA, PCR 08/08/2020 NEGATIVE  NEGATIVE Final  . Staphylococcus aureus 08/08/2020 NEGATIVE  NEGATIVE Final   Comment: (NOTE) The Xpert SA Assay (FDA approved for NASAL specimens in patients 53 years of age and older), is one component of a comprehensive surveillance program. It is not intended to diagnose infection nor to guide or monitor treatment. Performed at [facility], [place] [address] [place],  [address]   Office Visit on 08/04/2020  Component Date Value Ref Range Status  . Summary 08/04/2020 Note   Final   Comment: ==================================================================== ToxASSURE Select 13  (MW) ==================================================================== Test                             Result       Flag       Units  Drug Present and Declared for Prescription Verification   Hydromorphone                  784          EXPECTED   ng/mg creat    Hydromorphone may be administered as a scheduled prescription    medication; it is also an expected metabolite of hydrocodone.  Drug Present not Declared for Prescription Verification   Carboxy-THC                    19           UNEXPECTED ng/mg creat    Carboxy-THC is a metabolite of tetrahydrocannabinol (THC). Source of    THC is most commonly herbal marijuana or marijuana-based products,    but THC is also present in a scheduled prescription medication.    Trace amounts of THC can be present in hemp and cannabidiol (CBD)    products. This test is not intended to distinguish between delta-9-    tetrahydrocannabinol, t                          he predominant form of THC in most herbal or    marijuana-based products, and delta-8-tetrahydrocannabinol.  ==================================================================== Test                      Result    Flag   Units      Ref Range   Creatinine              86  mg/dL      >=20 ==================================================================== Declared Medications:  The flagging and interpretation on this report are based on the  following declared medications.  Unexpected results may arise from  inaccuracies in the declared medications.   **Note: The testing scope of this panel includes these medications:   Hydromorphone (Dilaudid)   **Note: The testing scope of this panel does not include the  following reported medications:   Sertraline (Zoloft) ==================================================================== For clinical consultation, please call [phone]. ====================================================================       X-Rays:DG Pelvis Portable  Result Date: 08/17/2020 CLINICAL DATA:  Left total hip arthroplasty. EXAM: PORTABLE PELVIS 1-2 VIEWS COMPARISON:  03/27/2017. FINDINGS: Interval left total hip arthroplasty. Femoral stem appears well seated. Associated subcutaneous and joint air. Previous right hip arthroplasty. IMPRESSION: Interval left total hip arthroplasty with expected postoperative findings. Electronically Signed   By: Lorin Picket M.D.   On: 08/17/2020 14:11   DG C-Arm 1-60 Min-No Report  Result Date: 08/17/2020 Fluoroscopy was utilized by the requesting physician.  No radiographic interpretation.   DG HIP OPERATIVE UNILAT W OR W/O PELVIS LEFT  Result Date: 08/17/2020 CLINICAL DATA:  Post hip arthroplasty EXAM: OPERATIVE LEFT HIP (WITH PELVIS IF PERFORMED) 2 VIEWS TECHNIQUE: Fluoroscopic spot image(s) were submitted for interpretation post-operatively. COMPARISON:  03/27/2017 FLUOROSCOPY TIME:  0 minutes 6 seconds Dose: 1.16 mGy FINDINGS: New LEFT hip prosthesis. Bones demineralized. No additional fracture or dislocation. Portions of a RIGHT hip prosthesis are again identified. IMPRESSION: New LEFT hip prosthesis without acute complication. Electronically Signed   By: Lavonia Dana M.D.   On: 08/17/2020 14:46    EKG: Orders placed or performed during the hospital encounter of 08/08/20  . EKG 12 lead per protocol  . EKG 12 lead per protocol     Hospital Course: Stephanie [name] is a 39 y.o. who was admitted to [facility]. They were brought to the operating room on 08/17/2020 and underwent Procedure(s): [place].  Patient tolerated the procedure well and was later transferred to the recovery room and then to the orthopaedic floor for postoperative care. They were given PO and IV analgesics for pain control following their surgery. They were given 24 hours of postoperative antibiotics of  Anti-infectives (From admission, onward)   Start     Dose/Rate Route  Frequency Ordered Stop   08/17/20 1700  ceFAZolin (ANCEF) IVPB 2g/100 mL premix        2 g 200 mL/hr over 30 Minutes Intravenous Every 6 hours 08/17/20 1430 08/17/20 2256   08/17/20 1045  vancomycin (VANCOCIN) IVPB 1000 mg/200 mL premix        1,000 mg 200 mL/hr over 60 Minutes Intravenous NOW 08/17/20 1033 08/17/20 1140   08/17/20 1033  vancomycin (VANCOCIN) 1-5 GM/200ML-% IVPB       Note to Pharmacy: Sherian Maroon: cabinet override      08/17/20 1033 08/17/20 2244   08/17/20 0945  ceFAZolin (ANCEF) IVPB 2g/100 mL premix  Status:  Discontinued        2 g 200 mL/hr over 30 Minutes Intravenous On call to O.R. 08/17/20 6767 08/17/20 1049     and started on DVT prophylaxis in the form of Xarelto and TED hose.   PT and OT were ordered for total joint protocol. Discharge planning consulted to help with postop disposition and equipment needs.  Patient had an uneventful night on the evening of surgery. They started to get up OOB with therapy on 08/17/20. Pt was seen  during rounds and was ready to go home pending progress with therapy. She worked with therapy on POD #1 and was meeting goals. Pt was discharged to home later that day in stable condition.  Diet: Regular diet Activity: WBAT Follow-up: in two weeks Disposition: Home Discharged Condition: good   Discharge Instructions    Call MD / Call 911   Complete by: As directed    If you experience chest pain or shortness of breath, CALL 911 and be transported to the hospital emergency room.  If you develope a fever above 101 F, pus (white drainage) or increased drainage or redness at the wound, or calf pain, call your surgeon's office.   Change dressing   Complete by: As directed    You have an adhesive waterproof bandage over the incision. Leave this in place until your first follow-up appointment. Once you remove this you will not need to place another bandage.   Constipation Prevention   Complete by: As directed    Drink plenty of  fluids.  Prune juice may be helpful.  You may use a stool softener, such as Colace (over the counter) 100 mg twice a day.  Use MiraLax (over the counter) for constipation as needed.   Diet - low sodium heart healthy   Complete by: As directed    Do not sit on low chairs, stoools or toilet seats, as it may be difficult to get up from low surfaces   Complete by: As directed    Driving restrictions   Complete by: As directed    No driving for two weeks   TED hose   Complete by: As directed    Use stockings (TED hose) for three weeks on both leg(s).  You may remove them at night for sleeping.   Weight bearing as tolerated   Complete by: As directed      Allergies as of 08/18/2020      Reactions   Amoxicillin Rash   fash swelled up and itching 04/2018   Aspirin Anaphylaxis   Other Anaphylaxis   Nsaids   Hydrocodone Itching      Latex Hives, Itching   Oxycodone    itching   Levaquin [levofloxacin]    Rash on chest no hives 04/2018      Medication List    STOP taking these medications   Brovana 15 MCG/2ML Nebu Generic drug: arformoterol   GARLIC PO   vitamin C 6659 MG tablet   vitamin E 45 MG (100 UNITS) capsule   Zinc Gluconate 30 MG Tabs     TAKE these medications   amLODipine 10 MG tablet Commonly known as: NORVASC Take 1 tablet (10 mg total) by mouth daily.   budesonide 0.5 MG/2ML nebulizer solution Commonly known as: PULMICORT Take 2 mLs (0.5 mg total) by nebulization in the morning and at bedtime. What changed:   when to take this  reasons to take this   EPINEPHrine 0.3 mg/0.3 mL Soaj injection Commonly known as: EpiPen 2-Pak Inject 0.3 mLs (0.3 mg total) into the muscle once as needed (allergic reaction). What changed: reasons to take this   formoterol 20 MCG/2ML nebulizer solution Commonly known as: Perforomist Take 2 mLs (20 mcg total) by nebulization 2 (two) times daily. What changed:   when to take this  reasons to take this   HYDROmorphone  2 MG tablet Commonly known as: DILAUDID Take 1-2 tablets (2-4 mg total) by mouth every 6 (six) hours as needed for severe pain. What  changed:   how much to take  how to take this  when to take this  reasons to take this  additional instructions   ketoconazole 2 % cream Commonly known as: NIZORAL Apply to rash bid for 3 weeks What changed:   how much to take  how to take this  when to take this  reasons to take this  additional instructions   methocarbamol 500 MG tablet Commonly known as: ROBAXIN Take 1 tablet (500 mg total) by mouth every 6 (six) hours as needed for muscle spasms.   montelukast 10 MG tablet Commonly known as: SINGULAIR Take 1 tablet (10 mg total) by mouth at bedtime.   naloxone 4 MG/0.1ML Liqd nasal spray kit Commonly known as: NARCAN Use as directed   omeprazole 40 MG capsule Commonly known as: PRILOSEC Take 1 capsule (40 mg total) by mouth in the morning and at bedtime. What changed:   when to take this  reasons to take this   prednisoLONE acetate 1 % ophthalmic suspension Commonly known as: PRED FORTE Place 1 drop into the right eye daily. Patient told RN that MD reduced dose to once daily   rivaroxaban 10 MG Tabs tablet Commonly known as: XARELTO Take 1 tablet (10 mg total) by mouth daily with breakfast.   sertraline 100 MG tablet Commonly known as: ZOLOFT Take one tablet po daily What changed:   how much to take  how to take this  when to take this   triamterene-hydrochlorothiazide 37.5-25 MG tablet Commonly known as: MAXZIDE-25 Take 1 tablet by mouth daily.   Ventolin HFA 108 (90 Base) MCG/ACT inhaler Generic drug: albuterol Inhale 2 puffs into the lungs every 4 (four) hours as needed for wheezing or shortness of breath.   albuterol (2.5 MG/3ML) 0.083% nebulizer solution Commonly known as: PROVENTIL Take 3 mLs (2.5 mg total) by nebulization every 4 (four) hours as needed for wheezing or shortness of breath.    albuterol 108 (90 Base) MCG/ACT inhaler Commonly known as: Proventil HFA Inhale 2 puffs into the lungs every 4 (four) hours as needed for wheezing or shortness of breath.            Discharge Care Instructions  (From admission, onward)         Start     Ordered   08/18/20 0000  Weight bearing as tolerated        08/18/20 0718   08/18/20 0000  Change dressing       Comments: You have an adhesive waterproof bandage over the incision. Leave this in place until your first follow-up appointment. Once you remove this you will not need to place another bandage.   08/18/20 2542          Follow-up Information    Gaynelle Arabian, MD. Go on 08/30/2020.   Specialty: Orthopedic Surgery Why: You are scheduled for first post op appointment on Tuesday April 19th at 2:15pm. Contact information: [address] [place] [address] [phone]               Signed: Fenton Foy, MBA, PA-C Orthopedic Surgery 08/22/2020, 7:24 AM

## 2020-08-30 DIAGNOSIS — J4520 Mild intermittent asthma, uncomplicated: Secondary | ICD-10-CM | POA: Diagnosis not present

## 2020-08-31 ENCOUNTER — Ambulatory Visit: Payer: Medicare HMO | Admitting: Family Medicine

## 2020-09-02 ENCOUNTER — Ambulatory Visit (INDEPENDENT_AMBULATORY_CARE_PROVIDER_SITE_OTHER): Payer: Medicare HMO | Admitting: Family Medicine

## 2020-09-02 ENCOUNTER — Other Ambulatory Visit: Payer: Self-pay

## 2020-09-02 DIAGNOSIS — G894 Chronic pain syndrome: Secondary | ICD-10-CM | POA: Diagnosis not present

## 2020-09-02 DIAGNOSIS — I10 Essential (primary) hypertension: Secondary | ICD-10-CM | POA: Diagnosis not present

## 2020-09-02 MED ORDER — AMLODIPINE BESYLATE 5 MG PO TABS: 5.0000 mg | ORAL_TABLET | Freq: Every day | ORAL | 5 refills | Status: DC

## 2020-09-02 MED ORDER — TRIAMCINOLONE ACETONIDE 0.1 % EX CREA
1.0000 | TOPICAL_CREAM | Freq: Two times a day (BID) | CUTANEOUS | 0 refills | Status: DC
Start: 2020-09-02 — End: 2022-04-23

## 2020-09-02 MED ORDER — HYDROMORPHONE HCL 2 MG PO TABS: ORAL_TABLET | ORAL | 0 refills | Status: DC

## 2020-09-03 NOTE — Progress Notes (Signed)
   Subjective:    Patient ID: Stephanie [name], female    DOB: 1961-02-23, 60 y.o.   MRN: [id number]  HPI  Patient recently had hip surgery she states she feels so much better she is not having to take as much pain medicine she states her overall energy level doing better no falls using her walker to get around denies any chest pain shortness of breath or swelling does relate some itching around the site of her surgery but no redness or drainage or fever  Review of Systems See above    Objective:   Physical Exam  Lungs clear heart regular pulse normal surgical incision looks good patient complains of itching around      Assessment & Plan:  Itching-May use triamcinolone no sign of infection Pain much better using half tablet every 6 hours caution drowsiness not for long-term use patient to follow-up within the next month for recheck hopefully over time she can taper off of the medicine

## 2020-09-07 ENCOUNTER — Other Ambulatory Visit: Payer: Self-pay | Admitting: Family Medicine

## 2020-09-07 ENCOUNTER — Encounter: Payer: Self-pay | Admitting: Family Medicine

## 2020-09-07 ENCOUNTER — Other Ambulatory Visit: Payer: Self-pay

## 2020-09-07 ENCOUNTER — Other Ambulatory Visit (HOSPITAL_COMMUNITY)
Admission: RE | Admit: 2020-09-07 | Discharge: 2020-09-07 | Disposition: A | Discharge status: Home / Self Care | Payer: Medicare HMO | Source: Ambulatory Visit | Attending: Family Medicine | Admitting: Family Medicine

## 2020-09-07 ENCOUNTER — Ambulatory Visit (INDEPENDENT_AMBULATORY_CARE_PROVIDER_SITE_OTHER): Payer: Medicare HMO | Admitting: Family Medicine

## 2020-09-07 ENCOUNTER — Ambulatory Visit (HOSPITAL_COMMUNITY)
Admission: RE | Admit: 2020-09-07 | Discharge: 2020-09-07 | Disposition: A | Discharge status: Home / Self Care | Payer: Medicare HMO | Source: Ambulatory Visit | Attending: Family Medicine | Admitting: Family Medicine

## 2020-09-07 VITALS — BP 134/80 | HR 85 | Temp 97.9°F | Wt 198.6 lb

## 2020-09-07 DIAGNOSIS — R0600 Dyspnea, unspecified: Secondary | ICD-10-CM | POA: Insufficient documentation

## 2020-09-07 DIAGNOSIS — R0609 Other forms of dyspnea: Secondary | ICD-10-CM

## 2020-09-07 DIAGNOSIS — J45901 Unspecified asthma with (acute) exacerbation: Secondary | ICD-10-CM | POA: Insufficient documentation

## 2020-09-07 DIAGNOSIS — E876 Hypokalemia: Secondary | ICD-10-CM | POA: Insufficient documentation

## 2020-09-07 DIAGNOSIS — R0602 Shortness of breath: Secondary | ICD-10-CM

## 2020-09-07 DIAGNOSIS — R059 Cough, unspecified: Secondary | ICD-10-CM | POA: Diagnosis not present

## 2020-09-07 LAB — CBC WITH DIFFERENTIAL/PLATELET
Abs Immature Granulocytes: 0.03 10*3/uL (ref 0.00–0.07)
Basophils Absolute: 0 10*3/uL (ref 0.0–0.1)
Basophils Relative: 1 %
Eosinophils Absolute: 0.5 10*3/uL (ref 0.0–0.5)
Eosinophils Relative: 6 %
HCT: 37.6 % (ref 36.0–46.0)
Hemoglobin: 12.1 g/dL (ref 12.0–15.0)
Immature Granulocytes: 0 %
Lymphocytes Relative: 16 %
Lymphs Abs: 1.3 10*3/uL (ref 0.7–4.0)
MCH: 29.7 pg (ref 26.0–34.0)
MCHC: 32.2 g/dL (ref 30.0–36.0)
MCV: 92.2 fL (ref 80.0–100.0)
Monocytes Absolute: 0.5 10*3/uL (ref 0.1–1.0)
Monocytes Relative: 7 %
Neutro Abs: 5.5 10*3/uL (ref 1.7–7.7)
Neutrophils Relative %: 70 %
Platelets: 374 10*3/uL (ref 150–400)
RBC: 4.08 MIL/uL (ref 3.87–5.11)
RDW: 15.3 % (ref 11.5–15.5)
WBC: 8 10*3/uL (ref 4.0–10.5)
nRBC: 0 % (ref 0.0–0.2)

## 2020-09-07 LAB — D-DIMER, QUANTITATIVE: D-Dimer, Quant: 3.83 ug/mL-FEU — ABNORMAL HIGH (ref 0.00–0.50)

## 2020-09-07 LAB — BASIC METABOLIC PANEL
Anion gap: 10 (ref 5–15)
BUN: 8 mg/dL (ref 6–20)
CO2: 28 mmol/L (ref 22–32)
Calcium: 9.7 mg/dL (ref 8.9–10.3)
Chloride: 100 mmol/L (ref 98–111)
Creatinine, Ser: 0.57 mg/dL (ref 0.44–1.00)
GFR, Estimated: >60 mL/min (ref >60)
Glucose, Bld: 102 mg/dL — ABNORMAL HIGH (ref 70–99)
Potassium: 3.9 mmol/L (ref 3.5–5.1)
Sodium: 138 mmol/L (ref 135–145)

## 2020-09-07 MED ORDER — METHYLPREDNISOLONE ACETATE 40 MG/ML IJ SUSP
40.0000 mg | Freq: Once | INTRAMUSCULAR | Status: AC
  Administered 2020-09-07: 40 mg via INTRAMUSCULAR

## 2020-09-07 MED ORDER — PREDNISONE 20 MG PO TABS: ORAL_TABLET | ORAL | 0 refills | Status: DC

## 2020-09-07 MED ORDER — IOHEXOL 350 MG/ML SOLN
75.0000 mL | Freq: Once | INTRAVENOUS | Status: AC | PRN
  Administered 2020-09-07: 75 mL via INTRAVENOUS

## 2020-09-07 MED ORDER — ALBUTEROL SULFATE HFA 108 (90 BASE) MCG/ACT IN AERS: 2.0000 | INHALATION_SPRAY | RESPIRATORY_TRACT | 5 refills | Status: DC | PRN

## 2020-09-07 NOTE — Progress Notes (Signed)
   Subjective:    Patient ID: Stephanie [name], female    DOB: [birth year], 60 y.o.   MRN: [id number]  HPI Pt has been short of breath for 2 days, headache, diarrhea for 2 days and coughing up phelgm last night. Pt did increase fluid to make her self cough. Pt stated her nose started popping and she has been relying on inhaler and neb treatments.  Patient relates a lot of coughing in addition to this drainage wheezing has history of asthma has not had a serious flareup in months denies body aches chills headache Review of Systems    See above Objective:   Physical Exam Vitals and nursing note reviewed.  Constitutional:      Appearance: She is well-developed.  HENT:     Head: Normocephalic.     Nose: Nose normal.     Mouth/Throat:     Pharynx: No oropharyngeal exudate.  Cardiovascular:     Rate and Rhythm: Normal rate.     Heart sounds: Normal heart sounds. No murmur heard.   Pulmonary:     Effort: Pulmonary effort is normal.     Breath sounds: Normal breath sounds. No wheezing.  Musculoskeletal:     Cervical back: Neck supple.  Lymphadenopathy:     Cervical: No cervical adenopathy.  Skin:    General: Skin is warm and dry.    Has had recent hip surgery was on blood thinner all the way through today       Assessment & Plan:  Asthma flareup Shortness of breath X-ray lab work indicated Prednisone taper Depo-Medrol No antibiotic indicated currently COVID test taken If D-dimer positive given symptomatology would need pulmonary embolus protocol.    D-dimer did come back abnormal will do pulmonary embolus protocol to make sure that this is not going on

## 2020-09-08 ENCOUNTER — Telehealth: Payer: Self-pay | Admitting: Family Medicine

## 2020-09-08 NOTE — Telephone Encounter (Signed)
It is unfortunate that this happened but this should not trigger any long-term problem but if she is having ongoing issues over the next couple weeks please follow-up

## 2020-09-08 NOTE — Telephone Encounter (Signed)
Pt wanted to let Dr.Scott know that when she had her CT scan done yesterday her IV popped out while in the machine and caused a knot and swollen bruise on Right arm. Radiology supervisor did speak with patient and told her to keep it elevated with an ice pack. Pt states her right arm hurt bad after IV came out. Pt wanted to let Dr.Scott know.

## 2020-09-09 ENCOUNTER — Telehealth: Payer: Self-pay

## 2020-09-09 LAB — SARS-COV-2, NAA 2 DAY TAT

## 2020-09-09 LAB — NOVEL CORONAVIRUS, NAA: SARS-CoV-2, NAA: NOT DETECTED

## 2020-09-09 MED ORDER — FLUCONAZOLE 150 MG PO TABS: ORAL_TABLET | ORAL | 0 refills | Status: DC

## 2020-09-09 NOTE — Telephone Encounter (Signed)
Pt has a vaginal it and she is wanting to know if something can be called in she was on a antibiotic and she feels this is what has caused a yeast infection. [facility]   Pt call back [phone]

## 2020-09-09 NOTE — Telephone Encounter (Signed)
Pt contacted. Pt having vaginal itching only. Diflucan sent in per protocol

## 2020-09-09 NOTE — Telephone Encounter (Signed)
Pt contacted and verbalized understanding.  

## 2020-09-16 NOTE — Progress Notes (Signed)
Patient ID: Stephanie [name], female   DOB: 01/26/1961, 60 y.o.   MRN: [id number]   Patient Follow-up on 05/06/2022at 1420 hrs  Complaining of pain at RT [facility] and posterior elbow, tingling down RT arm to hand since IV contrast infiltration. Still has a small superficial hematoma at the lateral aspect of the RIGHT antecubital fossa extending over the extensor muscle mass. Soft, nontender.   No additional ST swelling or discoloration. No warmth or redness Recommend continues elevation and ice intermittently for swelling if it helps Uncertain cause for tingling - ? Due to mild STS at site of hematoma. If persists, recommended she f/u with her physician. Nan Maya A. Tyron Russell, M.D.

## 2020-09-18 DIAGNOSIS — M1612 Unilateral primary osteoarthritis, left hip: Secondary | ICD-10-CM | POA: Diagnosis not present

## 2020-09-20 DIAGNOSIS — Z471 Aftercare following joint replacement surgery: Secondary | ICD-10-CM | POA: Diagnosis not present

## 2020-09-20 DIAGNOSIS — Z4789 Encounter for other orthopedic aftercare: Secondary | ICD-10-CM | POA: Diagnosis not present

## 2020-09-23 ENCOUNTER — Ambulatory Visit: Payer: Medicare HMO | Admitting: Family Medicine

## 2020-09-29 DIAGNOSIS — J4520 Mild intermittent asthma, uncomplicated: Secondary | ICD-10-CM | POA: Diagnosis not present

## 2020-10-04 ENCOUNTER — Ambulatory Visit: Payer: Medicare HMO | Admitting: Family Medicine

## 2020-10-06 ENCOUNTER — Other Ambulatory Visit: Payer: Self-pay

## 2020-10-06 ENCOUNTER — Encounter: Payer: Self-pay | Admitting: Family Medicine

## 2020-10-06 ENCOUNTER — Ambulatory Visit (INDEPENDENT_AMBULATORY_CARE_PROVIDER_SITE_OTHER): Payer: Medicare HMO | Admitting: Family Medicine

## 2020-10-06 VITALS — BP 169/118 | HR 89 | Temp 97.2°F | Wt 203.2 lb

## 2020-10-06 DIAGNOSIS — M79601 Pain in right arm: Secondary | ICD-10-CM

## 2020-10-06 DIAGNOSIS — I10 Essential (primary) hypertension: Secondary | ICD-10-CM

## 2020-10-06 NOTE — Progress Notes (Signed)
   Subjective:    Patient ID: Stephanie [name], female    DOB: January 03, 1961, 60 y.o.   MRN: [id number]  HPI Pt had hip surgery on 08/17/20. Pt here for follow up. Pt states "she feels like a brand new woman with this hip". One of the medication she is taking or was taking did not agree with her. Pt has stopped all meds since May 7 except inhaler and creams. Pt states she does feel better since not taking meds.   Patient does relate burning and numbness to the right lower arm she states all of this occurred after having IV from a CAT scan with contrast of trach that area  She states she is not taking any medications right now breathing doing well  Review of Systems     Objective:   Physical Exam Lungs are clear heart is regular pulse normal no edema skin warm dry  Right arm she has some tenderness around elbow and forearm region.  I do not see any discoloration.  I do not see any atrophy.      Assessment & Plan:  Right arm pain-we will follow this closely. I would not recommend any test at this point  We will recheck her again in several weeks  Blood pressure is significantly elevated restart amlodipine 5 mg daily  Hold off on other medications currently  Patient rarely using pain medicine currently which is much better for her

## 2020-10-07 MED ORDER — AMLODIPINE BESYLATE 5 MG PO TABS: 5.0000 mg | ORAL_TABLET | Freq: Every day | ORAL | 3 refills | Status: DC

## 2020-10-14 ENCOUNTER — Encounter: Payer: Self-pay | Admitting: Family Medicine

## 2020-10-14 ENCOUNTER — Ambulatory Visit (INDEPENDENT_AMBULATORY_CARE_PROVIDER_SITE_OTHER): Payer: Medicare HMO | Admitting: Family Medicine

## 2020-10-14 ENCOUNTER — Other Ambulatory Visit: Payer: Self-pay

## 2020-10-14 VITALS — BP 132/88 | HR 87 | Temp 97.2°F | Ht 64.0 in | Wt 208.0 lb

## 2020-10-14 DIAGNOSIS — I8392 Asymptomatic varicose veins of left lower extremity: Secondary | ICD-10-CM

## 2020-10-14 DIAGNOSIS — R200 Anesthesia of skin: Secondary | ICD-10-CM | POA: Diagnosis not present

## 2020-10-14 DIAGNOSIS — M79601 Pain in right arm: Secondary | ICD-10-CM | POA: Diagnosis not present

## 2020-10-14 DIAGNOSIS — I10 Essential (primary) hypertension: Secondary | ICD-10-CM | POA: Diagnosis not present

## 2020-10-14 NOTE — Progress Notes (Signed)
   Subjective:    Patient ID: Stephanie [name], female    DOB: 05-Sep-1960, 60 y.o.   MRN: [id number]  HPIfollow up on bp. Restarted amlodipine 5mg  at last visit.  Patient has to some degree intermittent right arm pain ever since having the CT angio where she states the IV infiltrated.  It caused some swelling pain discomfort she has had some ongoing tenderness but it is getting better She states she is having some intermittent numbness into the hand.  Denies any weakness. Energy level overall okay improving hip pain doing better rarely using pain medicine.   Review of Systems     Objective:   Physical Exam  Lungs clear respiratory rate normal heart regular subjective tingling into the hand but wearing a brace at nighttime is helped pointing toward the possibility of carpal tunnel  Patient has varicose veins in the left leg nonthrombosed    Assessment & Plan:  Elevated blood pressure overall doing fairly well.  Continue current measures as is.  's are doing much better getting away from pain medicine I have encouraged her to be totally off the pain medicine within 2 weeks  As for the arm normal appearance subjective discomfort in the forearm region some subjective numbness into the hand Possible carpal tunnel I do not discount the fact that she has discomfort related to the procedure but I do not feel that she needs consultation just yet hopefully this will gradually improve  Patient also has varicose veins in the left leg she was concerned about this it is a grouping that is nontender not thrombosed no redness.  I would recommend just watching may wear knee-high surgical support hose-down the road if she wants to have them sclerosed we can refer her to vascular surgery If this is not totally better within several weeks I recommend neurology consultation with nerve conduction study

## 2020-10-18 ENCOUNTER — Ambulatory Visit (INDEPENDENT_AMBULATORY_CARE_PROVIDER_SITE_OTHER): Payer: Medicare HMO | Admitting: Allergy and Immunology

## 2020-10-18 ENCOUNTER — Telehealth: Payer: Self-pay | Admitting: *Deleted

## 2020-10-18 ENCOUNTER — Telehealth: Payer: Self-pay

## 2020-10-18 ENCOUNTER — Other Ambulatory Visit: Payer: Self-pay

## 2020-10-18 ENCOUNTER — Encounter: Payer: Self-pay | Admitting: Allergy and Immunology

## 2020-10-18 VITALS — BP 136/88 | HR 78 | Temp 97.6°F | Resp 16 | Ht 64.0 in | Wt 206.8 lb

## 2020-10-18 DIAGNOSIS — H1010 Acute atopic conjunctivitis, unspecified eye: Secondary | ICD-10-CM

## 2020-10-18 DIAGNOSIS — J4550 Severe persistent asthma, uncomplicated: Secondary | ICD-10-CM | POA: Diagnosis not present

## 2020-10-18 DIAGNOSIS — J309 Allergic rhinitis, unspecified: Secondary | ICD-10-CM | POA: Diagnosis not present

## 2020-10-18 DIAGNOSIS — Z886 Allergy status to analgesic agent status: Secondary | ICD-10-CM

## 2020-10-18 DIAGNOSIS — H1013 Acute atopic conjunctivitis, bilateral: Secondary | ICD-10-CM

## 2020-10-18 DIAGNOSIS — K219 Gastro-esophageal reflux disease without esophagitis: Secondary | ICD-10-CM

## 2020-10-18 DIAGNOSIS — J4540 Moderate persistent asthma, uncomplicated: Secondary | ICD-10-CM

## 2020-10-18 MED ORDER — EPINEPHRINE 0.3 MG/0.3ML IJ SOAJ: 0.3000 mg | Freq: Once | INTRAMUSCULAR | 1 refills | Status: DC | PRN

## 2020-10-18 MED ORDER — BENRALIZUMAB 30 MG/ML ~~LOC~~ SOSY
30.0000 mg | PREFILLED_SYRINGE | Freq: Once | SUBCUTANEOUS | Status: AC
  Administered 2020-10-18: 30 mg via SUBCUTANEOUS

## 2020-10-18 MED ORDER — OMEPRAZOLE 40 MG PO CPDR: 40.0000 mg | DELAYED_RELEASE_CAPSULE | Freq: Two times a day (BID) | ORAL | 5 refills | Status: DC

## 2020-10-18 MED ORDER — ALBUTEROL SULFATE (2.5 MG/3ML) 0.083% IN NEBU: 2.5000 mg | INHALATION_SOLUTION | RESPIRATORY_TRACT | 1 refills | Status: DC | PRN

## 2020-10-18 MED ORDER — TRIAMCINOLONE ACETONIDE 55 MCG/ACT NA AERO: 1.0000 | INHALATION_SPRAY | Freq: Every day | NASAL | 5 refills | Status: DC

## 2020-10-18 MED ORDER — ALBUTEROL SULFATE HFA 108 (90 BASE) MCG/ACT IN AERS: 2.0000 | INHALATION_SPRAY | RESPIRATORY_TRACT | 2 refills | Status: DC | PRN

## 2020-10-18 MED ORDER — ARFORMOTEROL TARTRATE 15 MCG/2ML IN NEBU: 15.0000 ug | INHALATION_SOLUTION | Freq: Two times a day (BID) | RESPIRATORY_TRACT | 3 refills | Status: DC | PRN

## 2020-10-18 MED ORDER — CETIRIZINE HCL 10 MG PO TABS: 10.0000 mg | ORAL_TABLET | Freq: Every day | ORAL | 5 refills | Status: DC

## 2020-10-18 NOTE — Telephone Encounter (Signed)
Called and spoke to patient regarding Harrington Challenger and will need to go thru PAP [place] & [place] to get free drug in order for same to be affordable. Patient to bring Ins card and income to office sometime this week and sign consent to submit to PAP

## 2020-10-18 NOTE — Progress Notes (Signed)
Patient stated Harrington Challenger today in office with Dr.Kozlow. Patient was given training on how to administer injections at home as well as signed consent in office to begin injections monthly. Patient already has an Epipen and knows proper technique on use.Patient had no questions or concerns during our interactions. Patient is to call back once Tammy has gone through the financial agreements with her to make her first visit with [place] so we can watch her technique on proper injection administration. Patient was discharged after ten minutes per Dr. Lucie Leather and her feeling well.

## 2020-10-18 NOTE — Telephone Encounter (Signed)
Pa has been submitted thru cover my meds for arformoterol tartrate neb solution

## 2020-10-18 NOTE — Patient Instructions (Addendum)
  1. Treat inflammation:   A. Start benralizumab injections every 4 weeks.  Home administration.  2. Can restart the following if lower airway breathing problems:   A.  Budesonide 0.5 mg -nebulize 2 times per day  B.  Brovana -nebulize 1-2 times per day  C.  Albuterol HFA or albuterol nebulization 2 puffs every 4-6 hours if needed  3. Can restart the following if upper airway problems   A. Nasacort - 1 spray each nostril 1 time per day  4. Can restart the following to Treat reflux:   A.  Omeprazole 40mg  1 tablet two times per day  5. Continue OTC antihistamine and Epi-Pen if needed  6. Return to clinic in 6 months or earlier if problem

## 2020-10-18 NOTE — Telephone Encounter (Signed)
Called and left a message for patient to call our office back to schedule an appointment for her New start Fasenra.

## 2020-10-18 NOTE — Progress Notes (Signed)
[name] - [place] [facility] [name] [facility] [name] [facility] [name]   Follow-up Note  Referring Provider: Babs Sciara, MD Primary Provider: Babs Sciara, MD Date of Office Visit: 10/18/2020  Subjective:   Stephanie [name] (DOB: 1961/04/24) is a 60 y.o. female who [facility] in re-evaluation of the following:  HPI: Kessler returns to this clinic in reevaluation of severe asthma and allergic rhinitis and reflux and a history of nonsteroidal anti-inflammatory drug induced anaphylaxis.  Her last visit to this clinic was 19 April 2020.  She did okay until she had hip surgery in April 2022.  For the subsequent 2 weeks after her hip surgery she was very short of breath.  She was evaluated and treated by her primary care doctor with a CT scan angio which identified no clot and she was given a systemic steroid injection by injection and orally.  She slowly improved over the course of the next several weeks and since 25 Sep 2020 she has had no symptoms involving either her chest or her nose.  She does not need to use albuterol at this point.  It should be noted that she discontinued all of her medications after her evaluation with Dr. Benson Norway.  She discontinued her high blood pressure medicines, her anti-inflammatory medications for her airway, and therapy directed against reflux.  Her amlodipine was added back in because her blood pressure was out of control but she has not restarted her nebulized medications nor has she restarted her proton pump inhibitor.  Overall, she feels great.  She has no shortness of breath or chest tightness and no coughing and does not use albuterol.  She has no problems with her nose.  She has no problems with reflux.  Allergies as of 10/18/2020      Reactions   Amoxicillin Rash   fash swelled up and itching 04/2018   Aspirin Anaphylaxis   Other Anaphylaxis   Nsaids   Hydrocodone Itching      Latex Hives, Itching   Oxycodone     itching   Levaquin [levofloxacin]    Rash on chest no hives 04/2018      Medication List      albuterol 108 (90 Base) MCG/ACT inhaler Commonly known as: Proventil HFA Inhale 2 puffs into the lungs every 4 (four) hours as needed for wheezing or shortness of breath.   amLODipine 5 MG tablet Commonly known as: NORVASC Take 1 tablet (5 mg total) by mouth daily.   EPINEPHrine 0.3 mg/0.3 mL Soaj injection Commonly known as: EpiPen 2-Pak Inject 0.3 mLs (0.3 mg total) into the muscle once as needed (allergic reaction).   ketoconazole 2 % cream Commonly known as: NIZORAL Apply to rash bid for 3 weeks   triamcinolone cream 0.1 % Commonly known as: KENALOG Apply 1 application topically 2 (two) times daily.       Past Medical History:  Diagnosis Date  . Allergic rhinitis   . Arthritis   . Asthma   . Depression   . GERD (gastroesophageal reflux disease)   . Heart murmur   . Hypertension   . Pneumonia    hx of x 3     Past Surgical History:  Procedure Laterality Date  . CESAREAN SECTION  1985, 1989, 1995  . TONSILLECTOMY    . TOTAL HIP ARTHROPLASTY Right 03/27/2017   Procedure: RIGHT TOTAL HIP ARTHROPLASTY ANTERIOR APPROACH;  Surgeon: Ollen Gross, MD;  Location: [facility];  Service:  Orthopedics;  Laterality: Right;  . TOTAL HIP ARTHROPLASTY Left 08/17/2020   Procedure: TOTAL HIP ARTHROPLASTY ANTERIOR APPROACH;  Surgeon: Ollen Gross, MD;  Location: [facility];  Service: Orthopedics;  Laterality: Left;     Review of systems negative except as noted in HPI / PMHx or noted below:  Review of Systems  Constitutional: Negative.   HENT: Negative.   Eyes: Negative.   Respiratory: Negative.   Cardiovascular: Negative.   Gastrointestinal: Negative.   Genitourinary: Negative.   Musculoskeletal: Negative.   Skin: Negative.   Neurological: Negative.   Endo/Heme/Allergies: Negative.   Psychiatric/Behavioral: Negative.      Objective:   Vitals:   10/18/20 0924   BP: 136/88  Pulse: 78  Resp: 16  Temp: 97.6 F (36.4 C)  SpO2: 96%   Height: 5\' 4"  (162.6 cm)  Weight: 206 lb 12.8 oz (93.8 kg)   Physical Exam Constitutional:      Appearance: She is not diaphoretic.  HENT:     Head: Normocephalic.     Right Ear: Tympanic membrane, ear canal and external ear normal.     Left Ear: Tympanic membrane, ear canal and external ear normal.     Nose: Nose normal. No mucosal edema or rhinorrhea.     Mouth/Throat:     Pharynx: Uvula midline. No oropharyngeal exudate.  Eyes:     Conjunctiva/sclera: Conjunctivae normal.  Neck:     Thyroid: No thyromegaly.     Trachea: Trachea normal. No tracheal tenderness or tracheal deviation.  Cardiovascular:     Rate and Rhythm: Normal rate and regular rhythm.     Heart sounds: Normal heart sounds, S1 normal and S2 normal. No murmur heard.   Pulmonary:     Effort: No respiratory distress.     Breath sounds: Normal breath sounds. No stridor. No wheezing or rales.  Lymphadenopathy:     Head:     Right side of head: No tonsillar adenopathy.     Left side of head: No tonsillar adenopathy.     Cervical: No cervical adenopathy.  Skin:    Findings: No erythema or rash.     Nails: There is no clubbing.  Neurological:     Mental Status: She is alert.     Diagnostics:    Spirometry was performed and demonstrated an FEV1 of 1.72 at 81 % of predicted.  The patient had an Asthma Control Test with the following results: ACT Total Score: 20.    Results of a chest CT angio obtained 07 September 2020 identified the following:  Cardiovascular: The heart is normal in size. No pericardial effusion. The aorta is normal in caliber. No dissection. No atherosclerotic calcifications. The branch vessels are patent. No coronary artery calcifications are identified.  The pulmonary arterial tree is fairly well opacified. No filling defects to suggest pulmonary embolism.  Mediastinum/Nodes: No mediastinal or hilar mass or  adenopathy. The esophagus is grossly normal. The thyroid gland is unremarkable.  Lungs/Pleura: No acute pulmonary findings. No infiltrates, edema or effusions. No pulmonary lesions or nodules. No pleural effusions or pleural nodules. No interstitial lung disease or bronchiectasis.  Results of blood tests obtained 07 September 2020 identified WBC 8.0, absolute eosinophil 500, absolute lymphocyte 1300, hemoglobin 12.1, platelet 374.  Assessment and Plan:   1. Asthma, moderate persistent, well-controlled   2. Allergic rhinoconjunctivitis   3. Gastroesophageal reflux disease, unspecified whether esophagitis present   4. Allergy to NSAIDs     1. Treat inflammation:   A. Start  benralizumab injections every 4 weeks.  Home administration.  2. Can restart the following if lower airway breathing problems:   A.  Budesonide 0.5 mg -nebulize 2 times per day  B.  Brovana -nebulize 1-2 times per day  C.  Albuterol HFA or albuterol nebulization 2 puffs every 4-6 hours if needed  3. Can restart the following if upper airway problems   A. Nasacort - 1 spray each nostril 1 time per day  4. Can restart the following to Treat reflux:   A.  Omeprazole 40mg  1 tablet two times per day  5. Continue OTC antihistamine and Epi-Pen if needed  6. Return to clinic in 6 months or earlier if problem  Liridona is better at this point in time because she received a rather significant dose of systemic steroid at the tail end of April.  This effect will slowly wear off over the course of the next month or so.  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  We will start that agent today and have her do home administration and see what happens over the course of the next several months.  There is a collection of medications as noted above that can be started should they be required.  I will see  her back in this clinic in 6 months or earlier if there is a problem.  May, MD Allergy / Immunology [facility]

## 2020-10-18 NOTE — Telephone Encounter (Signed)
Per Dr. Lucie Leather he wants patient to start Fasenra at home injections every 4 weeks. She had her first injection in the office today. She has signed consent.

## 2020-10-19 ENCOUNTER — Encounter: Payer: Self-pay | Admitting: Allergy and Immunology

## 2020-10-19 DIAGNOSIS — M1612 Unilateral primary osteoarthritis, left hip: Secondary | ICD-10-CM | POA: Diagnosis not present

## 2020-10-20 MED ORDER — BENRALIZUMAB 30 MG/ML ~~LOC~~ SOSY
30.0000 mg | PREFILLED_SYRINGE | SUBCUTANEOUS | Status: AC
  Administered 2020-10-18: 30 mg via SUBCUTANEOUS

## 2020-10-20 NOTE — Telephone Encounter (Signed)
Aformoterol was denied by insurance. Please advise alternative.

## 2020-10-20 NOTE — Addendum Note (Signed)
Addended by: Devoria Glassing on: 10/20/2020 03:43 PM   Modules accepted: Orders

## 2020-10-21 NOTE — Telephone Encounter (Addendum)
Patient came into [place] office to fill out and sign [place] papers. I have faxed them to [facility] and put in [place] folder.

## 2020-10-21 NOTE — Telephone Encounter (Signed)
The formulary states that Rosalyn Gess is tier 4.   Striverdi 2.5 and Stiolto 2.5 solution are both tier 2.

## 2020-10-24 NOTE — Telephone Encounter (Signed)
Received papaerwork and submitted to [place]

## 2020-10-24 NOTE — Telephone Encounter (Signed)
Left a message with cover my meds so that they can send me the appeal info.

## 2020-10-25 NOTE — Telephone Encounter (Signed)
Received fax stating that patient application for the [place] prescription savings program has been approved effective 10/25/2020 through 05/13/2021. Spoke with patient, informed her of this approval, she is wondering if she will have a co-pay and anything. Tammy please advise.

## 2020-10-26 ENCOUNTER — Encounter: Payer: Self-pay | Admitting: *Deleted

## 2020-10-26 NOTE — Telephone Encounter (Signed)
Spoke to patient and advised Fasenra PAP approval and number given to order AJ and instrux to bring into clinic for instructions

## 2020-10-26 NOTE — Telephone Encounter (Signed)
Brovana 15 mcg/2 mL solution for nebulization, arformoterol 15 mcg/2 mL solution for nebulization, formoterol fumarate 20 mcg/2 mL solution for nebulization, Perforomist 20 mcg/2 mL solution for nebulization all require PAs.   Would you like to submit the appeal? If so, please provide specific detailed clinical information/rationale of your patient's health status to address their denial reasons.

## 2020-10-26 NOTE — Telephone Encounter (Signed)
Left a message for patient to return my call. 

## 2020-10-26 NOTE — Telephone Encounter (Signed)
Also, I sent a detailed Mychart message to patient explaining this.

## 2020-10-30 DIAGNOSIS — J4520 Mild intermittent asthma, uncomplicated: Secondary | ICD-10-CM | POA: Diagnosis not present

## 2020-11-15 ENCOUNTER — Ambulatory Visit: Payer: Medicare HMO

## 2020-11-18 DIAGNOSIS — M1612 Unilateral primary osteoarthritis, left hip: Secondary | ICD-10-CM | POA: Diagnosis not present

## 2020-11-29 DIAGNOSIS — J4520 Mild intermittent asthma, uncomplicated: Secondary | ICD-10-CM | POA: Diagnosis not present

## 2020-12-13 DIAGNOSIS — Z96642 Presence of left artificial hip joint: Secondary | ICD-10-CM | POA: Insufficient documentation

## 2020-12-19 DIAGNOSIS — M1612 Unilateral primary osteoarthritis, left hip: Secondary | ICD-10-CM | POA: Diagnosis not present

## 2020-12-30 DIAGNOSIS — J4520 Mild intermittent asthma, uncomplicated: Secondary | ICD-10-CM | POA: Diagnosis not present

## 2021-01-19 DIAGNOSIS — M1612 Unilateral primary osteoarthritis, left hip: Secondary | ICD-10-CM | POA: Diagnosis not present

## 2021-01-20 ENCOUNTER — Telehealth: Payer: Self-pay

## 2021-01-20 NOTE — Telephone Encounter (Signed)
Pa submitted thru cover my meds for perfmorist waiting on results

## 2021-01-27 NOTE — Telephone Encounter (Signed)
Pa denied by Francine Graven but is covered under part b plan will notify pharmacy

## 2021-01-30 DIAGNOSIS — J4520 Mild intermittent asthma, uncomplicated: Secondary | ICD-10-CM | POA: Diagnosis not present

## 2021-02-06 DIAGNOSIS — Z0100 Encounter for examination of eyes and vision without abnormal findings: Secondary | ICD-10-CM | POA: Diagnosis not present

## 2021-02-06 DIAGNOSIS — H524 Presbyopia: Secondary | ICD-10-CM | POA: Diagnosis not present

## 2021-02-08 ENCOUNTER — Other Ambulatory Visit: Payer: Self-pay

## 2021-02-08 ENCOUNTER — Ambulatory Visit: Payer: Medicare HMO | Admitting: Family Medicine

## 2021-02-08 ENCOUNTER — Ambulatory Visit (INDEPENDENT_AMBULATORY_CARE_PROVIDER_SITE_OTHER): Payer: Medicare HMO | Admitting: Family Medicine

## 2021-02-08 VITALS — BP 124/78 | HR 74 | Temp 97.7°F | Ht 64.0 in | Wt 205.0 lb

## 2021-02-08 DIAGNOSIS — L80 Vitiligo: Secondary | ICD-10-CM

## 2021-02-08 DIAGNOSIS — Z1211 Encounter for screening for malignant neoplasm of colon: Secondary | ICD-10-CM

## 2021-02-08 DIAGNOSIS — Z23 Encounter for immunization: Secondary | ICD-10-CM

## 2021-02-08 DIAGNOSIS — I8392 Asymptomatic varicose veins of left lower extremity: Secondary | ICD-10-CM

## 2021-02-08 DIAGNOSIS — E785 Hyperlipidemia, unspecified: Secondary | ICD-10-CM | POA: Diagnosis not present

## 2021-02-08 DIAGNOSIS — I10 Essential (primary) hypertension: Secondary | ICD-10-CM | POA: Diagnosis not present

## 2021-02-08 NOTE — Patient Instructions (Signed)

## 2021-02-08 NOTE — Progress Notes (Signed)
   Subjective:    Patient ID: Stephanie [name], female    DOB: 10/11/1960, 60 y.o.   MRN: [id number]  HPI Patient recently had hip surgery doing much better.  No longer on pain medicine.  She does suffer with low back pain and discomfort every morning gets worse.  We did discuss various measures to help including stretches.  She does not want to be back on pain medicine and that is perfectly fine.  HTN takes her medicine on a regular basis blood pressure under good control currently she is trying to watch her diet  Hyperlipidemia has not had lipid profile recently needs to check this.  Mild obesity patient is trying to watch portions stay active and trying to lose weight  Patient also noticed some skin changes wanted to know what that could be  She is due for colonoscopy  Flu shot due today  She also relates discomfort in the varicose veins on the left lower leg.  No blood clot with this no swelling. Review of Systems     Objective:   Physical Exam  General-in no acute distress Eyes-no discharge Lungs-respiratory rate normal, CTA CV-no murmurs,RRR Extremities skin warm dry no edema Neuro grossly normal Behavior normal, alert  Minimal vitiligo in the arms and legs.     Assessment & Plan:  1. Essential hypertension Blood pressure decent control continue current measures - Basic metabolic panel - Lipid panel  2. Varicose veins of left lower extremity, unspecified whether complicated Varicose veins left leg if it starts bothering her referral to vascular surgery  3. Vitiligo Mild vitiligo on the arms and legs pathophysiology discussed  4. Hyperlipidemia, unspecified hyperlipidemia type Hyperlipidemia needs to check lipid profile.  Watch diet. - Lipid panel  5. Encounter for immunization Flu shot today. - Flu Vaccine QUAD 29mo+IM (Fluarix, Fluzone & Alfiuria Quad PF)  6. Screen for colon cancer Colonoscopy referral. - Ambulatory referral to  Gastroenterology  Follow-up 4 months

## 2021-02-15 ENCOUNTER — Encounter: Payer: Self-pay | Admitting: Internal Medicine

## 2021-02-18 DIAGNOSIS — M1612 Unilateral primary osteoarthritis, left hip: Secondary | ICD-10-CM | POA: Diagnosis not present

## 2021-03-01 DIAGNOSIS — J4520 Mild intermittent asthma, uncomplicated: Secondary | ICD-10-CM | POA: Diagnosis not present

## 2021-03-14 ENCOUNTER — Telehealth: Payer: Self-pay

## 2021-03-14 ENCOUNTER — Other Ambulatory Visit: Payer: Self-pay

## 2021-03-14 ENCOUNTER — Ambulatory Visit (INDEPENDENT_AMBULATORY_CARE_PROVIDER_SITE_OTHER): Payer: Medicare HMO

## 2021-03-14 VITALS — HR 79 | Temp 97.7°F | Ht 63.0 in | Wt 199.2 lb

## 2021-03-14 DIAGNOSIS — Z0001 Encounter for general adult medical examination with abnormal findings: Secondary | ICD-10-CM

## 2021-03-14 DIAGNOSIS — Z599 Problem related to housing and economic circumstances, unspecified: Secondary | ICD-10-CM | POA: Diagnosis not present

## 2021-03-14 DIAGNOSIS — Z5948 Other specified lack of adequate food: Secondary | ICD-10-CM | POA: Diagnosis not present

## 2021-03-14 DIAGNOSIS — M25559 Pain in unspecified hip: Secondary | ICD-10-CM | POA: Diagnosis not present

## 2021-03-14 DIAGNOSIS — R531 Weakness: Secondary | ICD-10-CM | POA: Diagnosis not present

## 2021-03-14 DIAGNOSIS — Z0000 Encounter for general adult medical examination without abnormal findings: Secondary | ICD-10-CM

## 2021-03-14 NOTE — Progress Notes (Signed)
Subjective:   Stephanie [name] is a 60 y.o. female who presents for Medicare Annual (Subsequent) preventive examination.  Review of Systems           Objective:    Today's Vitals   03/14/21 0951 03/14/21 0954  Pulse: 79   Temp: 97.7 F (36.5 C)   Weight: 199 lb 3.2 oz (90.4 kg)   Height: 5\' 3"  (1.6 m)   PainSc:  7    Body mass index is 35.29 kg/m.  Advanced Directives 03/14/2021 08/17/2020 08/08/2020 05/08/2018 03/27/2017 03/22/2017 03/12/2017  Does Patient Have a Medical Advance Directive? No No No No No No No  Would patient like information on creating a medical advance directive? No - Patient declined Yes (Inpatient - patient defers creating a medical advance directive and declines information at this time) Yes (Inpatient - patient defers creating a medical advance directive and declines information at this time) - No - Patient declined Yes (MAU/Ambulatory/Procedural Areas - Information given) -  Pre-existing out of facility DNR order (yellow form or pink MOST form) - - - - - Physician notified to receive inpatient order -    Current Medications (verified) Outpatient Encounter Medications as of 03/14/2021  Medication Sig   albuterol (PROVENTIL HFA) 108 (90 Base) MCG/ACT inhaler Inhale 2 puffs into the lungs every 4 (four) hours as needed for wheezing or shortness of breath.   albuterol (PROVENTIL) (2.5 MG/3ML) 0.083% nebulizer solution Take 3 mLs (2.5 mg total) by nebulization every 4 (four) hours as needed for wheezing or shortness of breath.   albuterol (VENTOLIN HFA) 108 (90 Base) MCG/ACT inhaler Inhale 2 puffs into the lungs every 4 (four) hours as needed for wheezing or shortness of breath.   amLODipine (NORVASC) 5 MG tablet Take 1 tablet (5 mg total) by mouth daily.   arformoterol (BROVANA) 15 MCG/2ML NEBU Take 2 mLs (15 mcg total) by nebulization 2 (two) times daily as needed.   cetirizine (ZYRTEC) 10 MG tablet Take 1 tablet (10 mg total) by mouth daily.   EPINEPHrine  (EPIPEN 2-PAK) 0.3 mg/0.3 mL IJ SOAJ injection Inject 0.3 mg into the muscle once as needed for anaphylaxis (allergic reaction).   ketoconazole (NIZORAL) 2 % cream Apply to rash bid for 3 weeks (Patient taking differently: Apply 1 application topically daily as needed (Rash).)   methocarbamol (ROBAXIN) 500 MG tablet    MODERNA COVID-19 VACCINE 100 MCG/0.5ML injection    omeprazole (PRILOSEC) 40 MG capsule Take 1 capsule (40 mg total) by mouth in the morning and at bedtime.   potassium chloride SA (KLOR-CON) 20 MEQ tablet potassium chloride ER 20 mEq tablet,extended release  1 tablet by mouth twice a day until surgery   triamcinolone (NASACORT) 55 MCG/ACT AERO nasal inhaler Place 1 spray into the nose daily.   triamcinolone cream (KENALOG) 0.1 % Apply 1 application topically 2 (two) times daily.   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   Procedure: RIGHT TOTAL HIP ARTHROPLASTY ANTERIOR APPROACH;  Surgeon: Ollen Gross, MD;  Location: [facility];  Service: Orthopedics;  Laterality: Right;   TOTAL HIP ARTHROPLASTY Left 08/17/2020   Procedure: TOTAL HIP ARTHROPLASTY ANTERIOR APPROACH;  Surgeon: Ollen Gross, MD;  Location: [facility];  Service: Orthopedics;  Laterality: Left;    Family History  Problem Relation Age of Onset   Hypertension Mother    Hypertension Father    Social History   Socioeconomic History   Marital status: Divorced    Spouse name:  Not on file   Number of children: 3   Years of education: Not on file   Highest education level: Not on file  Occupational History   Not on file  Tobacco Use   Smoking status: Former    Packs/day: 0.25    Years: 18.00    Pack years: 4.50    Types: Cigarettes    Start date: 09/24/1994    Quit date: 03/06/2013    Years since quitting: 8.0   Smokeless tobacco: Never  Vaping Use   Vaping Use: Never used  Substance and Sexual Activity   Alcohol use: Yes    Alcohol/week: 0.0 standard drinks    Comment: wine and beer on occasion   Drug use: No   Sexual activity: Not Currently  Other Topics Concern   Not on file  Social History Narrative   2 daughters and 1 son, visit frequently per pt.    Social Determinants of Health   Financial Resource Strain: High Risk   Difficulty of Paying Living Expenses: Hard  Food Insecurity: Food Insecurity Present   Worried About Running Out of Food in the Last Year: Sometimes true   Ran Out of Food in the Last Year: Sometimes true  Transportation Needs: No Transportation Needs   Lack of Transportation (Medical): No   Lack of Transportation (Non-Medical): No  Physical Activity: Insufficiently Active   Days of Exercise per Week: 3 days   Minutes of Exercise per Session: 10 min  Stress: Stress Concern Present   Feeling of Stress : Rather much  Social Connections: Moderately Integrated   Frequency of Communication with Friends and Family: More than three times a week   Frequency of Social Gatherings with Friends and Family: More than three times a week   Attends Religious Services: 1 to 4 times per year   Active Member of [employer] or Organizations: No   Attends Engineer, structural: 1 to 4 times per year   Marital Status: Divorced    Tobacco Counseling Counseling given: Not Answered   Clinical Intake:  Pre-visit preparation completed: Yes  Pain : 0-10 Pain Score: 7  Pain Type: Chronic pain Pain Location: Back Pain Descriptors /  Indicators: Aching Pain Onset: More than a month ago Pain Frequency: Intermittent     BMI - recorded: 35.29 Nutritional Status: BMI > 30  Obese Nutritional Risks: None Diabetes: No     Diabetic?No  Interpreter Needed?: No  Information entered by :: MJ Saprina Chuong, LPN   Activities of Daily Living In your present state of health, do you have any difficulty performing the following activities: 03/14/2021 08/17/2020  Hearing? N N  Vision? N N  Difficulty concentrating or making decisions? Y N  Walking or climbing stairs? Y N  Dressing or bathing? N N  Doing errands, shopping? N N  Preparing Food and eating ? N -  Using the Toilet? N -  In  the past six months, have you accidently leaked urine? Y -  Comment Pt states she has had issues in the last 2 months, encouraged pt to make appointment with Dr. Gerda Diss. -  Do you have problems with loss of bowel control? N -  Managing your Medications? N -  Managing your Finances? N -  Housekeeping or managing your Housekeeping? Y -  Some recent data might be hidden    Patient Care Team: Babs Sciara, MD as PCP - General (Family Medicine)  Indicate any recent Medical Services you may have received from other than [facility] providers in the past year (date may be approximate).     Assessment:   This is a routine wellness examination for [place].  Hearing/Vision screen Hearing Screening - Comments:: No hearing issues.  Vision Screening - Comments:: Glasses. Dr. Earlene Plater in [place]. 2022.  Dietary issues and exercise activities discussed: Current Exercise Habits: Home exercise routine, Type of exercise: walking, Time (Minutes): 10, Frequency (Times/Week): 3, Weekly Exercise (Minutes/Week): 30, Intensity: Mild, Exercise limited by: cardiac condition(s);orthopedic condition(s)   Goals Addressed             This Visit's Progress    Exercise 3x per week (30 min per time)       To improve health and stay heathy per pt.        Depression  Screen PHQ 2/9 Scores 03/14/2021 02/08/2021 10/14/2020 08/04/2020 03/07/2020 05/21/2018 11/11/2017  PHQ - 2 Score 2 2 0 3 5 6 3   PHQ- 9 Score 7 7 - 15 11 23 18     Fall Risk Fall Risk  03/14/2021 02/08/2021 10/15/2019 06/06/2019 03/03/2019  Falls in the past year? 1 0 0 0 0  Number falls in past yr: 0 0 - - -  Injury with Fall? 0 0 - - -  Risk for fall due to : Impaired balance/gait;Impaired mobility;Impaired vision;History of fall(s) No Fall Risks - Impaired balance/gait;Impaired mobility -  Follow up Falls prevention discussed Falls evaluation completed Falls evaluation completed Falls evaluation completed Falls evaluation completed    FALL RISK PREVENTION PERTAINING TO THE HOME:  Any stairs in or around the home? Yes  If so, are there any without handrails? Yes  Home free of loose throw rugs in walkways, pet beds, electrical cords, etc? Yes  Adequate lighting in your home to reduce risk of falls? Yes    ASSISTIVE DEVICES UTILIZED TO PREVENT FALLS:  Life alert? Yes  Use of a cane, walker or w/c? Yes  Grab bars in the bathroom? Yes  Shower chair or bench in shower? Yes  Elevated toilet seat or a handicapped toilet? Yes   TIMED UP AND GO:  Was the test performed? Yes .  Length of time to ambulate 10 feet: 10 sec.   Gait slow and steady with assistive device  Cognitive Function:     6CIT Screen 03/14/2021  What Year? 0 points  What month? 0 points  What time? 0 points  Count back from 20 0 points  Months in reverse 0 points    Immunizations Immunization History  Administered Date(s) Administered   Influenza,inj,Quad PF,6+ Mos 02/21/2015, 04/20/2016, 03/23/2017, 07/02/2018, 03/03/2019, 03/07/2020, 02/08/2021   Influenza,inj,quad, With Preservative 02/12/2019   Influenza-Unspecified 02/04/2014   Moderna SARS-COV2 Booster Vaccination 04/14/2020   Moderna Sars-Covid-2 Vaccination 07/15/2019, 08/05/2019   PFIZER(Purple Top)SARS-COV-2 Vaccination 09/02/2019, 10/01/2019    Pneumococcal Polysaccharide-23 03/03/2019    TDAP status: Due, Education has been provided regarding the importance of this vaccine. Advised may  receive this vaccine at local pharmacy or Health Dept. Aware to provide a copy of the vaccination record if obtained from local pharmacy or Health Dept. Verbalized acceptance and understanding.  Flu Vaccine status: Up to date  Pneumococcal vaccine status: Up to date  Covid-19 vaccine status: Information provided on how to obtain vaccines.   Qualifies for Shingles Vaccine? Yes   Zostavax completed No   Shingrix Completed?: No.    Education has been provided regarding the importance of this vaccine. Patient has been advised to call insurance company to determine out of pocket expense if they have not yet received this vaccine. Advised may also receive vaccine at local pharmacy or Health Dept. Verbalized acceptance and understanding.  Screening Tests Health Maintenance  Topic Date Due   TETANUS/TDAP  Never done   Zoster Vaccines- Shingrix (1 of 2) Never done   PAP SMEAR-Modifier  Never done   COLONOSCOPY (Pts 45-60yrs Insurance coverage will need to be confirmed)  Never done   Pneumococcal Vaccine 108-1 Years old (2 - PCV) 03/02/2020   COVID-19 Vaccine (5 - Booster) 06/09/2020   COLON CANCER SCREENING ANNUAL FOBT  03/30/2021   MAMMOGRAM  06/21/2021   INFLUENZA VACCINE  Completed   Hepatitis C Screening  Completed   HIV Screening  Completed   HPV VACCINES  Aged Out    Health Maintenance  Health Maintenance Due  Topic Date Due   TETANUS/TDAP  Never done   Zoster Vaccines- Shingrix (1 of 2) Never done   PAP SMEAR-Modifier  Never done   COLONOSCOPY (Pts 45-93yrs Insurance coverage will need to be confirmed)  Never done   Pneumococcal Vaccine 56-4 Years old (2 - PCV) 03/02/2020   COVID-19 Vaccine (5 - Booster) 06/09/2020    Colorectal cancer screening: Referral to GI placed Pt states Colonoscopy is scheduled for 04/22/2021. Pt aware the  office will call re: appt.  Mammogram status: Ordered 03/14/2021. Pt provided with contact info and advised to call to schedule appt.   Bone Density status: Ordered NOT DUE UNTIL AGE 36. Pt provided with contact info and advised to call to schedule appt.  Lung Cancer Screening: (Low Dose CT Chest recommended if Age 79-80 years, 30 pack-year currently smoking OR have quit w/in 15years.) does not qualify.    Additional Screening:  Hepatitis C Screening: does qualify; Completed 12/29/2016  Vision Screening: Recommended annual ophthalmology exams for early detection of glaucoma and other disorders of the eye. Is the patient up to date with their annual eye exam?  Yes  Who is the provider or what is the name of the office in which the patient attends annual eye exams? Dr. Earlene Plater, [place] If pt is not established with a provider, would they like to be referred to a provider to establish care? No .   Dental Screening: Recommended annual dental exams for proper oral hygiene  Community Resource Referral / Chronic Care Management: CRR required this visit?  Yes   CCM required this visit?  No      Plan:     I have personally reviewed and noted the following in the patient's chart:   Medical and social history Use of alcohol, tobacco or illicit drugs  Current medications and supplements including opioid prescriptions.  Functional ability and status Nutritional status Physical activity Advanced directives List of other physicians Hospitalizations, surgeries, and ER visits in previous 12 months Vitals Screenings to include cognitive, depression, and falls Referrals and appointments  In addition, I have reviewed and discussed  with patient certain preventive protocols, quality metrics, and best practice recommendations. A written personalized care plan for preventive services as well as general preventive health recommendations were provided to patient.     Darral Dash,  LPN   01/19/1190   Nurse Notes: Pt states she is doing, "okay." C/O hip pain and stiffness. Encouraged pt to call surgeon to advise. Pt verbalized understanding. Pt states she is going to ask him if she can restart PT because she feels that will help. Pt states she also is having issues being able to afford food and utilities. Pt also requests in home assistance to help with housework and ADL's. Referral made to [facility] to for assistance with food, utilities and home health aide. Pt overdue for Mammogram. Order placed today.

## 2021-03-14 NOTE — Telephone Encounter (Signed)
Contacted patient. Pt verbalized understanding. Pt will need to call back to schedule when her daughter is available.

## 2021-03-14 NOTE — Patient Instructions (Signed)
Stephanie [name] , Thank you for taking time to come for your Medicare Wellness Visit. I appreciate your ongoing commitment to your health goals. Please review the following plan we discussed and let me know if I can assist you in the future.   Screening recommendations/referrals: Colonoscopy: Scheduled for 04/22/21 Mammogram: Done 07/12/2019. Ordered today.  Bone Density: Not due until age 59. Recommended yearly ophthalmology/optometry visit for glaucoma screening and checkup Recommended yearly dental visit for hygiene and checkup  Vaccinations: Influenza vaccine: Done 02/08/21  Pneumococcal vaccine: Done 03/03/2019 Tdap vaccine: Due Repeat in 10 years  Shingles vaccine: Shingrix discussed. Please contact your pharmacy for coverage information.    Covid-19: Done 07/15/2019, 08/05/2019, 04/14/2020  Advanced directives: Advance directive discussed with you today. I have provided a copy for you to complete at home and have notarized. Once this is complete please bring a copy in to our office so we can scan it into your chart.   Conditions/risks identified: Aim for 30 minutes of exercise or each day, drink 6-8 glasses of water and eat lots of fruits and vegetables.   Next appointment: Follow up in one year for your annual wellness visit. 2023.  Preventive Care 40-64 Years, Female Preventive care refers to lifestyle choices and visits with your health care provider that can promote health and wellness. What does preventive care include? A yearly physical exam. This is also called an annual well check. Dental exams once or twice a year. Routine eye exams. Ask your health care provider how often you should have your eyes checked. Personal lifestyle choices, including: Daily care of your teeth and gums. Regular physical activity. Eating a healthy diet. Avoiding tobacco and drug use. Limiting alcohol use. Practicing safe sex. Taking low-dose aspirin daily starting at age 50. Taking vitamin and  mineral supplements as recommended by your health care provider. What happens during an annual well check? The services and screenings done by your health care provider during your annual well check will depend on your age, overall health, lifestyle risk factors, and family history of disease. Counseling  Your health care provider may ask you questions about your: Alcohol use. Tobacco use. Drug use. Emotional well-being. Home and relationship well-being. Sexual activity. Eating habits. Work and work environment. Method of birth control. Menstrual cycle. Pregnancy history. Screening  You may have the following tests or measurements: Height, weight, and BMI. Blood pressure. Lipid and cholesterol levels. These may be checked every 5 years, or more frequently if you are over 50 years old. Skin check. Lung cancer screening. You may have this screening every year starting at age 55 if you have a 30-pack-year history of smoking and currently smoke or have quit within the past 15 years. Fecal occult blood test (FOBT) of the stool. You may have this test every year starting at age 50. Flexible sigmoidoscopy or colonoscopy. You may have a sigmoidoscopy every 5 years or a colonoscopy every 10 years starting at age 50. Hepatitis C blood test. Hepatitis B blood test. Sexually transmitted disease (STD) testing. Diabetes screening. This is done by checking your blood sugar (glucose) after you have not eaten for a while (fasting). You may have this done every 1-3 years. Mammogram. This may be done every 1-2 years. Talk to your health care provider about when you should start having regular mammograms. This may depend on whether you have a family history of breast cancer. BRCA-related cancer screening. This may be done if you have a family history of breast, ovarian, tubal,   or peritoneal cancers. Pelvic exam and Pap test. This may be done every 3 years starting at age 103. Starting at age 3, this may  be done every 5 years if you have a Pap test in combination with an HPV test. Bone density scan. This is done to screen for osteoporosis. You may have this scan if you are at high risk for osteoporosis. Discuss your test results, treatment options, and if necessary, the need for more tests with your health care provider. Vaccines  Your health care provider may recommend certain vaccines, such as: Influenza vaccine. This is recommended every year. Tetanus, diphtheria, and acellular pertussis (Tdap, Td) vaccine. You may need a Td booster every 10 years. Zoster vaccine. You may need this after age 33. Pneumococcal 13-valent conjugate (PCV13) vaccine. You may need this if you have certain conditions and were not previously vaccinated. Pneumococcal polysaccharide (PPSV23) vaccine. You may need one or two doses if you smoke cigarettes or if you have certain conditions. Talk to your health care provider about which screenings and vaccines you need and how often you need them. This information is not intended to replace advice given to you by your health care provider. Make sure you discuss any questions you have with your health care provider. Document Released: 05/27/2015 Document Revised: 01/18/2016 Document Reviewed: 03/01/2015 Elsevier Interactive Patient Education  2017 [place] Prevention in the Home Falls can cause injuries. They can happen to people of all ages. There are many things you can do to make your home safe and to help prevent falls. What can I do on the outside of my home? Regularly fix the edges of walkways and driveways and fix any cracks. Remove anything that might make you trip as you walk through a door, such as a raised step or threshold. Trim any bushes or trees on the path to your home. Use bright outdoor lighting. Clear any walking paths of anything that might make someone trip, such as rocks or tools. Regularly check to see if handrails are loose or  broken. Make sure that both sides of any steps have handrails. Any raised decks and porches should have guardrails on the edges. Have any leaves, snow, or ice cleared regularly. Use sand or salt on walking paths during winter. Clean up any spills in your garage right away. This includes oil or grease spills. What can I do in the bathroom? Use night lights. Install grab bars by the toilet and in the tub and shower. Do not use towel bars as grab bars. Use non-skid mats or decals in the tub or shower. If you need to sit down in the shower, use a plastic, non-slip stool. Keep the floor dry. Clean up any water that spills on the floor as soon as it happens. Remove soap buildup in the tub or shower regularly. Attach bath mats securely with double-sided non-slip rug tape. Do not have throw rugs and other things on the floor that can make you trip. What can I do in the bedroom? Use night lights. Make sure that you have a light by your bed that is easy to reach. Do not use any sheets or blankets that are too big for your bed. They should not hang down onto the floor. Have a firm chair that has side arms. You can use this for support while you get dressed. Do not have throw rugs and other things on the floor that can make you trip. What can I do  in the kitchen? Clean up any spills right away. Avoid walking on wet floors. Keep items that you use a lot in easy-to-reach places. If you need to reach something above you, use a strong step stool that has a grab bar. Keep electrical cords out of the way. Do not use floor polish or wax that makes floors slippery. If you must use wax, use non-skid floor wax. Do not have throw rugs and other things on the floor that can make you trip. What can I do with my stairs? Do not leave any items on the stairs. Make sure that there are handrails on both sides of the stairs and use them. Fix handrails that are broken or loose. Make sure that handrails are as long as  the stairways. Check any carpeting to make sure that it is firmly attached to the stairs. Fix any carpet that is loose or worn. Avoid having throw rugs at the top or bottom of the stairs. If you do have throw rugs, attach them to the floor with carpet tape. Make sure that you have a light switch at the top of the stairs and the bottom of the stairs. If you do not have them, ask someone to add them for you. What else can I do to help prevent falls? Wear shoes that: Do not have high heels. Have rubber bottoms. Are comfortable and fit you well. Are closed at the toe. Do not wear sandals. If you use a stepladder: Make sure that it is fully opened. Do not climb a closed stepladder. Make sure that both sides of the stepladder are locked into place. Ask someone to hold it for you, if possible. Clearly mark and make sure that you can see: Any grab bars or handrails. First and last steps. Where the edge of each step is. Use tools that help you move around (mobility aids) if they are needed. These include: Canes. Walkers. Scooters. Crutches. Turn on the lights when you go into a dark area. Replace any light bulbs as soon as they burn out. Set up your furniture so you have a clear path. Avoid moving your furniture around. If any of your floors are uneven, fix them. If there are any pets around you, be aware of where they are. Review your medicines with your doctor. Some medicines can make you feel dizzy. This can increase your chance of falling. Ask your doctor what other things that you can do to help prevent falls. This information is not intended to replace advice given to you by your health care provider. Make sure you discuss any questions you have with your health care provider. Document Released: 02/24/2009 Document Revised: 10/06/2015 Document Reviewed: 06/04/2014 Elsevier Interactive Patient Education  2017 [employer]  

## 2021-03-14 NOTE — Telephone Encounter (Signed)
Needs discussion with nurse  I would recommend an office visit next week

## 2021-03-14 NOTE — Telephone Encounter (Signed)
Pt here for AWV. Pt states she is having issues with depression. PHQ2-9 with score of 7. Pt states she was on Sertraline but stopped it and other medications because of an asthma attack. Pt wanted to know if it is okay to restart the Sertraline? Thank you.

## 2021-03-20 ENCOUNTER — Telehealth: Payer: Self-pay | Admitting: *Deleted

## 2021-03-20 NOTE — Telephone Encounter (Signed)
   Telephone encounter was:  Unsuccessful.  03/20/2021 Name: AVANGELINE STOCKBURGER MRN: [id number] DOB: 16-Jul-1960  Unsuccessful outbound call made today to assist with:  Transportation Needs , Food Insecurity, Home Modifications, Caregiver Stress, and Financial Difficulties related to housing   Outreach Attempt:  1st Attempt  Mailbox is full. Alois Cliche -[facility] Guide , Embedded Care Coordination [facility], Care Management  [phone] [address] [place] , [place] [address] Email : [email or web address]

## 2021-03-21 DIAGNOSIS — M1612 Unilateral primary osteoarthritis, left hip: Secondary | ICD-10-CM | POA: Diagnosis not present

## 2021-03-22 ENCOUNTER — Ambulatory Visit (INDEPENDENT_AMBULATORY_CARE_PROVIDER_SITE_OTHER): Payer: Medicare HMO | Admitting: Family Medicine

## 2021-03-22 ENCOUNTER — Other Ambulatory Visit: Payer: Self-pay

## 2021-03-22 ENCOUNTER — Ambulatory Visit (HOSPITAL_COMMUNITY)
Admission: RE | Admit: 2021-03-22 | Discharge: 2021-03-22 | Disposition: A | Discharge status: Home / Self Care | Payer: Medicare HMO | Source: Ambulatory Visit | Attending: Family Medicine | Admitting: Family Medicine

## 2021-03-22 VITALS — BP 122/82 | Temp 97.7°F | Wt 203.6 lb

## 2021-03-22 DIAGNOSIS — Z23 Encounter for immunization: Secondary | ICD-10-CM

## 2021-03-22 DIAGNOSIS — M5432 Sciatica, left side: Secondary | ICD-10-CM | POA: Diagnosis not present

## 2021-03-22 DIAGNOSIS — M545 Low back pain, unspecified: Secondary | ICD-10-CM | POA: Diagnosis not present

## 2021-03-22 DIAGNOSIS — E785 Hyperlipidemia, unspecified: Secondary | ICD-10-CM | POA: Diagnosis not present

## 2021-03-22 DIAGNOSIS — F321 Major depressive disorder, single episode, moderate: Secondary | ICD-10-CM | POA: Diagnosis not present

## 2021-03-22 MED ORDER — DULOXETINE HCL 30 MG PO CPEP: ORAL_CAPSULE | ORAL | 5 refills | Status: DC

## 2021-03-22 NOTE — Progress Notes (Signed)
   Subjective:    Patient ID: Stephanie [name], female    DOB: 06-01-1960, 60 y.o.   MRN: [id number]  HPI Pt here today to talk with provider regarding depression. Pt states her mind races all the time, moody, doesn't want to be around anyone. Pt was on a depression med earlier but not on anything at this moment. Pt  states not suicidal She finds herself depressed She also has significant back and hip pain She would like to see if there is something that could help with both She desires not to be on a bunch of pain meds Once again not suicidal Having bilateral hip pain low back pain as well   Review of Systems     Objective:   Physical Exam Lungs are clear heart regular pulse normal subjective discomfort in the back and in the legs  Moderate depression noted PHQ-9 reviewed     Assessment & Plan:   1. Sciatica, left side Try Cymbalta follow-up again within 30 days stretching were shown follow-up with specialist as well - DG Lumbar Spine Complete  2. Lumbar pain Stretching exercises x-rays ordered - DG Lumbar Spine Complete  3. Depression, major, single episode, moderate (HCC) Hopefully Cymbalta will help not suicidal if she gets worse referral for counseling  4. Need for vaccination Pneumonia vaccine - Pneumococcal conjugate vaccine 20-valent (Prevnar 20)  5. Hyperlipidemia, unspecified hyperlipidemia type Healthy diet regular activity - Pneumococcal conjugate vaccine 20-valent (Prevnar 20)

## 2021-03-28 ENCOUNTER — Ambulatory Visit: Payer: Medicare HMO

## 2021-04-10 ENCOUNTER — Telehealth: Payer: Self-pay | Admitting: *Deleted

## 2021-04-10 NOTE — Telephone Encounter (Signed)
   Telephone encounter was:  Unsuccessful.  04/10/2021 Name: Stephanie [name] MRN: [id number] DOB: 05/29/1960  Unsuccessful outbound call made today to assist with:  Food Insecurity  Outreach Attempt:  2nd Attempt mailbox is full cannot accept any messages at this time Alois Cliche -[facility] Guide , Embedded Care Coordination [facility], Care Management  [phone] [address] [place] , [place] [address] Email : [email or web address]

## 2021-04-13 ENCOUNTER — Telehealth: Payer: Self-pay | Admitting: *Deleted

## 2021-04-13 DIAGNOSIS — Z96643 Presence of artificial hip joint, bilateral: Secondary | ICD-10-CM | POA: Diagnosis not present

## 2021-04-18 ENCOUNTER — Emergency Department (HOSPITAL_COMMUNITY): Payer: Medicare HMO

## 2021-04-18 ENCOUNTER — Emergency Department (HOSPITAL_COMMUNITY)
Admission: EM | Admit: 2021-04-18 | Discharge: 2021-04-18 | Disposition: A | Discharge status: Home / Self Care | Payer: Medicare HMO | Attending: Emergency Medicine | Admitting: Emergency Medicine

## 2021-04-18 ENCOUNTER — Encounter (HOSPITAL_COMMUNITY): Payer: Self-pay

## 2021-04-18 ENCOUNTER — Telehealth: Payer: Self-pay | Admitting: Family Medicine

## 2021-04-18 ENCOUNTER — Other Ambulatory Visit: Payer: Self-pay

## 2021-04-18 DIAGNOSIS — M25552 Pain in left hip: Secondary | ICD-10-CM | POA: Insufficient documentation

## 2021-04-18 DIAGNOSIS — R0602 Shortness of breath: Secondary | ICD-10-CM | POA: Diagnosis not present

## 2021-04-18 DIAGNOSIS — Z87891 Personal history of nicotine dependence: Secondary | ICD-10-CM | POA: Insufficient documentation

## 2021-04-18 DIAGNOSIS — J45909 Unspecified asthma, uncomplicated: Secondary | ICD-10-CM

## 2021-04-18 DIAGNOSIS — I10 Essential (primary) hypertension: Secondary | ICD-10-CM | POA: Diagnosis not present

## 2021-04-18 DIAGNOSIS — M25551 Pain in right hip: Secondary | ICD-10-CM | POA: Diagnosis not present

## 2021-04-18 DIAGNOSIS — J4540 Moderate persistent asthma, uncomplicated: Secondary | ICD-10-CM | POA: Insufficient documentation

## 2021-04-18 DIAGNOSIS — Z28311 Partially vaccinated for covid-19: Secondary | ICD-10-CM | POA: Diagnosis not present

## 2021-04-18 DIAGNOSIS — Z91040 Latex allergy status: Secondary | ICD-10-CM | POA: Diagnosis not present

## 2021-04-18 DIAGNOSIS — Z79899 Other long term (current) drug therapy: Secondary | ICD-10-CM | POA: Insufficient documentation

## 2021-04-18 DIAGNOSIS — M25559 Pain in unspecified hip: Secondary | ICD-10-CM

## 2021-04-18 DIAGNOSIS — Z96643 Presence of artificial hip joint, bilateral: Secondary | ICD-10-CM | POA: Diagnosis not present

## 2021-04-18 NOTE — ED Provider Notes (Signed)
[facility] EMERGENCY DEPARTMENT Provider Note   CSN: [id number] Arrival date & time: 04/18/21  1847     History Chief Complaint  Patient presents with   Shortness of Breath    Stephanie [name] is a 60 y.o. female.  HPI She presents for evaluation of shortness of breath that she noticed today.  She is using her usual asthma medicines and albuterol, regularly.  She denies fever, chills, cough, chest pain, weakness or dizziness.  She has had some pain in her low back and her hips, for which she was recently evaluated by her orthopedist.  She had x-rays of her hips.  He subsequently referred her to a back doctor, but she has not seen that doctor yet.  She denies falling.  She came here by private vehicle.  There are no other known active modifying factors    Past Medical History:  Diagnosis Date   Allergic rhinitis    Arthritis    Asthma    Depression    GERD (gastroesophageal reflux disease)    Heart murmur    Hypertension    Pneumonia    hx of x 3     Patient Active Problem List   Diagnosis Date Noted   History of revision of total replacement of left hip joint 12/13/2020   Varicose veins of left lower extremity 10/14/2020   Primary osteoarthritis of left hip 08/17/2020   Screening for colon cancer 03/07/2020   Post-menopausal bleeding 03/07/2020   Depression, major, single episode, in partial remission (HCC) 03/07/2020   Need for vaccination 03/07/2020   OA (osteoarthritis) of hip 03/27/2017   History of contact dermatitis 02/11/2017   Chronic pain syndrome 01/09/2017   Osteoarthritis, hip, bilateral 07/03/2015   Moderate persistent asthma 04/27/2015   Allergic rhinoconjunctivitis 04/27/2015   Right-sided low back pain with right-sided sciatica 02/22/2015   Essential hypertension 10/17/2012    Past Surgical History:  Procedure Laterality Date   CESAREAN SECTION  1985, 1989, 1995   TONSILLECTOMY     TOTAL HIP ARTHROPLASTY Right 03/27/2017   Procedure: RIGHT  TOTAL HIP ARTHROPLASTY ANTERIOR APPROACH;  Surgeon: Ollen Gross, MD;  Location: [facility];  Service: Orthopedics;  Laterality: Right;   TOTAL HIP ARTHROPLASTY Left 08/17/2020   Procedure: TOTAL HIP ARTHROPLASTY ANTERIOR APPROACH;  Surgeon: Ollen Gross, MD;  Location: [facility];  Service: Orthopedics;  Laterality: Left;      OB History   No obstetric history on file.     Family History  Problem Relation Age of Onset   Hypertension Mother    Hypertension Father     Social History   Tobacco Use   Smoking status: Former    Packs/day: 0.25    Years: 18.00    Pack years: 4.50    Types: Cigarettes    Start date: 09/24/1994    Quit date: 03/06/2013    Years since quitting: 8.1   Smokeless tobacco: Never  Vaping Use   Vaping Use: Never used  Substance Use Topics   Alcohol use: Yes    Alcohol/week: 0.0 standard drinks    Comment: wine and beer on occasion   Drug use: No    Home Medications Prior to Admission medications   Medication Sig Start Date End Date Taking? Authorizing Provider  albuterol (PROVENTIL HFA) 108 (90 Base) MCG/ACT inhaler Inhale 2 puffs into the lungs every 4 (four) hours as needed for wheezing or shortness of breath. 09/07/20   Babs Sciara, MD  albuterol (PROVENTIL) (2.5  MG/3ML) 0.083% nebulizer solution Take 3 mLs (2.5 mg total) by nebulization every 4 (four) hours as needed for wheezing or shortness of breath. 10/18/20   Kozlow, Alvira Philips, MD  amLODipine (NORVASC) 5 MG tablet Take 1 tablet (5 mg total) by mouth daily. 10/07/20   Babs Sciara, MD  arformoterol (BROVANA) 15 MCG/2ML NEBU Take 2 mLs (15 mcg total) by nebulization 2 (two) times daily as needed. 10/18/20   Kozlow, Alvira Philips, MD  cetirizine (ZYRTEC) 10 MG tablet Take 1 tablet (10 mg total) by mouth daily. 10/18/20   Kozlow, Alvira Philips, MD  DULoxetine (CYMBALTA) 30 MG capsule Take one tablet po daily 03/22/21   Luking, Jonna Coup, MD  EPINEPHrine (EPIPEN 2-PAK) 0.3 mg/0.3 mL IJ SOAJ injection Inject 0.3 mg into  the muscle once as needed for anaphylaxis (allergic reaction). 10/18/20   Kozlow, Alvira Philips, MD  ketoconazole (NIZORAL) 2 % cream Apply to rash bid for 3 weeks Patient taking differently: Apply 1 application topically daily as needed (Rash). 12/11/19   Babs Sciara, MD  methocarbamol (ROBAXIN) 500 MG tablet  11/01/20   [provider]  MODERNA COVID-19 VACCINE 100 MCG/0.5ML injection  10/06/20   [provider]  omeprazole (PRILOSEC) 40 MG capsule Take 1 capsule (40 mg total) by mouth in the morning and at bedtime. 10/18/20   Kozlow, Alvira Philips, MD  potassium chloride SA (KLOR-CON) 20 MEQ tablet potassium chloride ER 20 mEq tablet,extended release  1 tablet by mouth twice a day until surgery    [provider]  triamcinolone (NASACORT) 55 MCG/ACT AERO nasal inhaler Place 1 spray into the nose daily. 10/18/20   Kozlow, Alvira Philips, MD  triamcinolone cream (KENALOG) 0.1 % Apply 1 application topically 2 (two) times daily. 09/02/20   Babs Sciara, MD    Allergies    Amoxicillin, Aspirin, Other, Hydrocodone, Latex, Oxycodone, and Levaquin [levofloxacin]  Review of Systems   Review of Systems  All other systems reviewed and are negative.  Physical Exam Updated Vital Signs BP (!) 134/94   Pulse 88   Temp 98.1 F (36.7 C) (Oral)   Resp (!) 22   Ht  (1.626 m)   Wt 89.8 kg   LMP 01/28/2017   SpO2 98%   BMI 33.99 kg/m   Physical Exam Vitals and nursing note reviewed.  Constitutional:      General: She is not in acute distress.    Appearance: She is well-developed. She is not ill-appearing, toxic-appearing or diaphoretic.  HENT:     Head: Normocephalic and atraumatic.     Right Ear: External ear normal.     Left Ear: External ear normal.  Eyes:     Conjunctiva/sclera: Conjunctivae normal.     Pupils: Pupils are equal, round, and reactive to light.  Neck:     Trachea: Phonation normal.  Cardiovascular:     Rate and Rhythm: Normal rate and regular rhythm.     Heart  sounds: Normal heart sounds.  Pulmonary:     Effort: Pulmonary effort is normal. No respiratory distress.     Breath sounds: Normal breath sounds. No stridor. No wheezing or rhonchi.  Abdominal:     Palpations: Abdomen is soft.     Tenderness: There is no abdominal tenderness.  Musculoskeletal:        General: Normal range of motion.     Cervical back: Normal range of motion and neck supple.  Skin:    General: Skin is warm and  dry.  Neurological:     Mental Status: She is alert and oriented to person, place, and time.     Cranial Nerves: No cranial nerve deficit.     Sensory: No sensory deficit.     Motor: No abnormal muscle tone.     Coordination: Coordination normal.  Psychiatric:        Mood and Affect: Mood normal.        Behavior: Behavior normal.        Thought Content: Thought content normal.        Judgment: Judgment normal.    ED Results / Procedures / Treatments   Labs (all labs ordered are listed, but only abnormal results are displayed) Labs Reviewed - No data to display  EKG None  Radiology DG Chest 2 View  Result Date: 04/18/2021 CLINICAL DATA:  Shortness of breath for 2 days EXAM: CHEST - 2 VIEW COMPARISON:  09/07/2020 FINDINGS: The heart size and mediastinal contours are within normal limits. Both lungs are clear. The visualized skeletal structures are unremarkable. IMPRESSION: No active cardiopulmonary disease. Electronically Signed   By: Alcide Clever M.D.   On: 04/18/2021 19:30    Procedures Procedures   Medications Ordered in ED Medications - No data to display  ED Course  I have reviewed the triage vital signs and the nursing notes.  Pertinent labs & imaging results that were available during my care of the patient were reviewed by me and considered in my medical decision making (see chart for details).    MDM Rules/Calculators/A&P                            Patient Vitals for the past 24 hrs:  BP Temp Temp src Pulse Resp SpO2 Height Weight   04/18/21 2030 (!) 134/94 -- -- 88 (!) 22 98 % -- --  04/18/21 2000 (!) 146/86 -- -- 86 (!) 25 100 % -- --  04/18/21 1903 (!) 124/101 98.1 F (36.7 C) Oral 98 19 100 % -- --  04/18/21 1903 -- -- -- -- -- -- 5\' 4"  (1.626 m) 89.8 kg    9:20 PM Reevaluation with update and discussion. After initial assessment and treatment, an updated evaluation reveals she is comfortable.  Findings discussed with the patient and all questions were answered.   Medical Decision Making:  This patient is presenting for evaluation of chronic problems of asthma and hip pain, which does not require a range of treatment options, and is not a complaint that involves a high risk of morbidity and mortality.  Radiologic Tests Ordered, included chest X ray.  I independently Visualized: Radiograph images, which show no infiltrate or edema   Critical Interventions-clinical evaluation, radiography, observation and reassess  After These Interventions, the Patient was reevaluated and was found stable for discharge.  Has been at baseline.  She is able to move her hips and legs easily.  There is no sign of acute decompensation.  CRITICAL CARE-no Performed by: Mancel Bale  Nursing Notes Reviewed/ Care Coordinated Applicable Imaging Reviewed Interpretation of Laboratory Data incorporated into ED treatment  The patient appears reasonably screened and/or stabilized for discharge and I doubt any other medical condition or other [facility] requiring further screening, evaluation, or treatment in the ED at this time prior to discharge.  Plan: Home Medications-continue usual medicine; Home Treatments-gradual increase activity; return here if the recommended treatment, does not improve the symptoms; Recommended follow up-follow-up with current  providers as planned.     Final Clinical Impression(s) / ED Diagnoses Final diagnoses:  Mild asthma without complication, unspecified whether persistent  Hip pain    Rx / DC  Orders ED Discharge Orders     None        Mancel Bale, MD 04/18/21 2121

## 2021-04-18 NOTE — Discharge Instructions (Signed)
The evaluation today is reassuring.  There are no signs of complications from her asthma or hip problems.  Continue taking your usual medicines.  Follow-up with the back doctor that Dr. Despina Hick referred due to and see your PCP or pulmonary doctor for problems with your asthma.

## 2021-04-18 NOTE — ED Triage Notes (Addendum)
Pov from home with cc of SOB and right hip pain since 2018. Also complains of left hip pain since 2022. Says that they were both replaced and both stiff this morning with some pain.  Has a bruise on right shin that she doesn't know how it got there.  Denies any injury to the issues above.   SOB started last night. Came here in May for the same. 100% on room air after ambulating to triage room.  Hx of asthma.

## 2021-04-18 NOTE — Telephone Encounter (Signed)
Pt calling in and states that she is having unusual shortness of breath and right hip pain. Pt states she noticed a bruise on her knee (near bottom of knee) and is unsure of how it got there. Pt states leg is feeling weird. Pt had hip surgery in 2018. Advised pt to go to ER to be fully evaluated. Pt verbalized understanding.

## 2021-04-24 ENCOUNTER — Encounter: Payer: Self-pay | Admitting: Family Medicine

## 2021-04-24 ENCOUNTER — Ambulatory Visit: Payer: Medicare HMO | Admitting: Family Medicine

## 2021-04-25 ENCOUNTER — Ambulatory Visit: Payer: Medicare HMO | Admitting: Allergy and Immunology

## 2021-05-10 ENCOUNTER — Ambulatory Visit: Payer: Medicare HMO

## 2021-05-21 DIAGNOSIS — M1612 Unilateral primary osteoarthritis, left hip: Secondary | ICD-10-CM | POA: Diagnosis not present

## 2021-05-22 DIAGNOSIS — M5136 Other intervertebral disc degeneration, lumbar region: Secondary | ICD-10-CM | POA: Diagnosis not present

## 2021-05-25 ENCOUNTER — Other Ambulatory Visit: Payer: Self-pay | Admitting: Family Medicine

## 2021-05-25 MED ORDER — BLOOD GLUCOSE METER KIT: PACK | 0 refills | Status: DC

## 2021-06-12 ENCOUNTER — Other Ambulatory Visit: Payer: Self-pay

## 2021-06-12 ENCOUNTER — Ambulatory Visit (INDEPENDENT_AMBULATORY_CARE_PROVIDER_SITE_OTHER): Payer: Medicare HMO | Admitting: Family Medicine

## 2021-06-12 ENCOUNTER — Encounter: Payer: Self-pay | Admitting: Family Medicine

## 2021-06-12 VITALS — BP 138/86 | HR 89 | Temp 97.9°F | Wt 198.8 lb

## 2021-06-12 DIAGNOSIS — F325 Major depressive disorder, single episode, in full remission: Secondary | ICD-10-CM | POA: Diagnosis not present

## 2021-06-12 DIAGNOSIS — I10 Essential (primary) hypertension: Secondary | ICD-10-CM

## 2021-06-12 DIAGNOSIS — Z13220 Encounter for screening for lipoid disorders: Secondary | ICD-10-CM | POA: Diagnosis not present

## 2021-06-12 DIAGNOSIS — Z79899 Other long term (current) drug therapy: Secondary | ICD-10-CM | POA: Diagnosis not present

## 2021-06-12 DIAGNOSIS — R600 Localized edema: Secondary | ICD-10-CM | POA: Diagnosis not present

## 2021-06-12 MED ORDER — INDAPAMIDE 1.25 MG PO TABS: 1.2500 mg | ORAL_TABLET | Freq: Every day | ORAL | 1 refills | Status: DC

## 2021-06-12 MED ORDER — POTASSIUM CHLORIDE CRYS ER 20 MEQ PO TBCR: 20.0000 meq | EXTENDED_RELEASE_TABLET | Freq: Every day | ORAL | 1 refills | Status: DC

## 2021-06-12 MED ORDER — AMLODIPINE BESYLATE 2.5 MG PO TABS: 5.0000 mg | ORAL_TABLET | Freq: Every day | ORAL | 1 refills | Status: DC

## 2021-06-12 NOTE — Patient Instructions (Signed)
Hi Stephanie [name] It was good to see you today. We are making some changes. Amlodipine-new dose-2.5 mg 1 daily this will help your blood pressure with less swelling in your ankles  Adding indapamide 1.25 mg 1 every morning, this is a blood pressure/diuretic.  It will help keep your blood pressure down and also minimize swelling in your ankles  Adding potassium 1 daily  Please do your blood work in approximately 2 weeks Please follow-up in approximately 4 weeks

## 2021-06-12 NOTE — Progress Notes (Signed)
° °  Subjective:    Patient ID: Stephanie [name], female    DOB: 25-Jun-1960, 61 y.o.   MRN: [id number]  HPI Pt here for follow up. Pt was placed on Cymbalta but pt took herself off of Cymbalta due to chest pain.  Patient states her moods are doing very well She denies any major setbacks She is trying to eat healthy and stay active still uses a cane still disabled but her hips are doing much better after surgery Blood pressure has been doing ok. Highest reading at home 138/40 Blood pressure not under good control today as well as we would like it plus also having pedal edema related to blood pressure medicine amlodipine Denies any PND denies orthopnea  Review of Systems     Objective:   Physical Exam Lungs clear heart regular pulse normal BP fair but not great       Assessment & Plan:  1. Essential hypertension Blood pressure could be under better control.  She is also having pedal edema.  Shared discussion.  Reduce amlodipine.  Add indapamide.  Add potassium.  Check lab work within 2 weeks.  Follow-up within 3 to 4 weeks. - Basic Metabolic Panel (BMET) - Lipid Profile - Hepatic function panel  2. Screening, lipid Screening cholesterol recommended. - Basic Metabolic Panel (BMET) - Lipid Profile - Hepatic function panel  3. High risk medication use Recommend metabolic 7 because of adding diuretic. - Basic Metabolic Panel (BMET) - Lipid Profile - Hepatic function panel  4. Depression, major, single episode, complete remission ([place]) Major depression under good control in complete remission no need to add any medicines there  5. Pedal edema Patient does have pedal edema that goes down when she lays down at night overall she should be well.  Going down on the dose of amlodipine should help this resolve.  Follow-up in 4 weeks

## 2021-06-19 ENCOUNTER — Ambulatory Visit (INDEPENDENT_AMBULATORY_CARE_PROVIDER_SITE_OTHER): Payer: Medicare HMO | Admitting: *Deleted

## 2021-06-19 ENCOUNTER — Other Ambulatory Visit: Payer: Self-pay

## 2021-06-19 VITALS — Ht 64.0 in | Wt 198.0 lb

## 2021-06-19 DIAGNOSIS — Z1211 Encounter for screening for malignant neoplasm of colon: Secondary | ICD-10-CM

## 2021-06-19 NOTE — Progress Notes (Signed)
Gastroenterology Pre-Procedure Review  Request Date: 06/19/2021 Requesting Physician: Dr. Lilyan Punt, no previous TCS  PATIENT REVIEW QUESTIONS: The patient responded to the following health history questions as indicated:    1. Diabetes Melitis: no 2. Joint replacements in the past 12 months: yes, hip replacement Apr 2022 3. Major health problems in the past 3 months: no 4. Has an artificial valve or MVP: no 5. Has a defibrillator: no 6. Has been advised in past to take antibiotics in advance of a procedure like teeth cleaning: yes 7. Family history of colon cancer: no 8. Alcohol Use: yes, 2 glasses of wine a week 9. Illicit drug Use: yes, CBD as needed for pain 10. History of sleep apnea: no  11. History of coronary artery or other vascular stents placed within the last 12 months: no 12. History of any prior anesthesia complications: no 13. Body mass index is 33.99 kg/m.    MEDICATIONS & ALLERGIES:    Patient reports the following regarding taking any blood thinners:   Plavix? no Aspirin? no Coumadin? no Brilinta? no Xarelto? no Eliquis? no Pradaxa? no Savaysa? no Effient? no  Patient confirms/reports the following medications:  Current Outpatient Medications  Medication Sig Dispense Refill   albuterol (PROVENTIL HFA) 108 (90 Base) MCG/ACT inhaler Inhale 2 puffs into the lungs every 4 (four) hours as needed for wheezing or shortness of breath. 18 g 5   albuterol (PROVENTIL) (2.5 MG/3ML) 0.083% nebulizer solution Take 3 mLs (2.5 mg total) by nebulization every 4 (four) hours as needed for wheezing or shortness of breath. 216 each 1   amLODipine (NORVASC) 2.5 MG tablet Take 2 tablets (5 mg total) by mouth daily. (Patient taking differently: Take 2.5 mg by mouth daily. Takes 1 tablet 2.5 mg daily.) 90 tablet 1   Ascorbic Acid (VITAMIN C) 1000 MG tablet Take 1,000 mg by mouth daily.     cetirizine (ZYRTEC) 10 MG tablet Take 1 tablet (10 mg total) by mouth daily. 30 tablet 5    Cholecalciferol (VITAMIN D3) 50 MCG (2000 UT) TABS Take by mouth daily at 6 (six) AM.     EPINEPHrine (EPIPEN 2-PAK) 0.3 mg/0.3 mL IJ SOAJ injection Inject 0.3 mg into the muscle once as needed for anaphylaxis (allergic reaction). 2 each 1   indapamide (LOZOL) 1.25 MG tablet Take 1 tablet (1.25 mg total) by mouth daily. 90 tablet 1   ketoconazole (NIZORAL) 2 % cream Apply to rash bid for 3 weeks (Patient taking differently: Apply 1 application topically daily as needed (Rash).) 30 g 3   methocarbamol (ROBAXIN) 500 MG tablet as needed.     potassium chloride SA (KLOR-CON M) 20 MEQ tablet Take 1 tablet (20 mEq total) by mouth daily. 90 tablet 1   triamcinolone (NASACORT) 55 MCG/ACT AERO nasal inhaler Place 1 spray into the nose daily. (Patient taking differently: Place 1 spray into the nose as needed.) 16.5 g 5   triamcinolone cream (KENALOG) 0.1 % Apply 1 application topically 2 (two) times daily. (Patient taking differently: Apply 1 application topically as needed.) 30 g 0   vitamin E 180 MG (400 UNITS) capsule Take 400 Units by mouth daily.     Zinc 50 MG CAPS Take by mouth daily at 6 (six) AM.     Current Facility-Administered Medications  Medication Dose Route Frequency Provider Last Rate Last Admin   Benralizumab SOSY 30 mg  30 mg Subcutaneous Q28 days Alfonse Spruce, MD   30 mg at 10/18/20 1542  Patient confirms/reports the following allergies:  Allergies  Allergen Reactions   Amoxicillin Rash    fash swelled up and itching 04/2018   Aspirin Anaphylaxis   Other Anaphylaxis    Nsaids   Hydrocodone Itching        Latex Hives and Itching   Oxycodone     itching   Levaquin [Levofloxacin]     Rash on chest no hives 04/2018    No orders of the defined types were placed in this encounter.   AUTHORIZATION INFORMATION Primary Insurance: [employer],  [place] #: [id number],  Group #: [id number] Pre-Cert / Berkley Harvey required:  Pre-Cert / Berkley Harvey #:   SCHEDULE INFORMATION: Procedure  has been scheduled as follows:  Date: , Time:   Location: [facility] with Dr. Marletta Lor  This Gastroenterology Pre-Precedure Review Form is being routed to the following provider(s): Lewie Loron, NP

## 2021-06-20 ENCOUNTER — Ambulatory Visit (INDEPENDENT_AMBULATORY_CARE_PROVIDER_SITE_OTHER): Payer: Medicare HMO | Admitting: Allergy and Immunology

## 2021-06-20 ENCOUNTER — Other Ambulatory Visit: Payer: Self-pay

## 2021-06-20 VITALS — BP 132/90 | HR 105 | Temp 98.0°F | Resp 16 | Ht 64.0 in | Wt 199.4 lb

## 2021-06-20 DIAGNOSIS — J4540 Moderate persistent asthma, uncomplicated: Secondary | ICD-10-CM | POA: Diagnosis not present

## 2021-06-20 DIAGNOSIS — Z886 Allergy status to analgesic agent status: Secondary | ICD-10-CM

## 2021-06-20 DIAGNOSIS — K219 Gastro-esophageal reflux disease without esophagitis: Secondary | ICD-10-CM

## 2021-06-20 DIAGNOSIS — J3089 Other allergic rhinitis: Secondary | ICD-10-CM | POA: Diagnosis not present

## 2021-06-20 DIAGNOSIS — J301 Allergic rhinitis due to pollen: Secondary | ICD-10-CM

## 2021-06-20 MED ORDER — EPINEPHRINE 0.3 MG/0.3ML IJ SOAJ: 0.3000 mg | Freq: Once | INTRAMUSCULAR | 1 refills | Status: DC | PRN

## 2021-06-20 MED ORDER — BUDESONIDE 0.5 MG/2ML IN SUSP: 0.5000 mg | Freq: Two times a day (BID) | RESPIRATORY_TRACT | 3 refills | Status: DC | PRN

## 2021-06-20 MED ORDER — TRIAMCINOLONE ACETONIDE 55 MCG/ACT NA AERO: 1.0000 | INHALATION_SPRAY | Freq: Every day | NASAL | 5 refills | Status: DC

## 2021-06-20 MED ORDER — ALBUTEROL SULFATE (2.5 MG/3ML) 0.083% IN NEBU: 2.5000 mg | INHALATION_SOLUTION | RESPIRATORY_TRACT | 1 refills | Status: DC | PRN

## 2021-06-20 MED ORDER — ALBUTEROL SULFATE HFA 108 (90 BASE) MCG/ACT IN AERS: 2.0000 | INHALATION_SPRAY | RESPIRATORY_TRACT | 5 refills | Status: DC | PRN

## 2021-06-20 MED ORDER — OMEPRAZOLE 40 MG PO CPDR: 40.0000 mg | DELAYED_RELEASE_CAPSULE | Freq: Every day | ORAL | 5 refills | Status: DC

## 2021-06-20 MED ORDER — ARFORMOTEROL TARTRATE 15 MCG/2ML IN NEBU: 15.0000 ug | INHALATION_SOLUTION | Freq: Two times a day (BID) | RESPIRATORY_TRACT | 3 refills | Status: DC

## 2021-06-20 MED ORDER — CETIRIZINE HCL 10 MG PO TABS: 10.0000 mg | ORAL_TABLET | Freq: Every day | ORAL | 5 refills | Status: DC

## 2021-06-20 NOTE — Patient Instructions (Signed)
°  1. Can restart the following if lower airway breathing problems:   A.  Budesonide 0.5 mg -nebulize 2 times per day  B.  Brovana -nebulize 1-2 times per day  C.  Albuterol HFA 2 puffs or albuterol nebulization every 4-6 hours    2. Can restart the following if upper airway problems   A. Nasacort - 1 spray each nostril 1 time per day  3. Can restart the following to Treat reflux:   A.  Omeprazole 40mg  1 tablet  time per day  4. Continue OTC antihistamine and Epi-Pen if needed  5. Return to clinic in 6 months or earlier if problem

## 2021-06-20 NOTE — Progress Notes (Signed)
[facility] [place] [facility] [place]   Follow-up Note  Referring Provider: Kathyrn Drown, MD Primary Provider: Kathyrn Drown, MD Date of Office Visit: 06/20/2021  Subjective:   Stephanie [name] (DOB: 07/22/1960) is a 61 y.o. female who returns to the [facility] on 06/20/2021 in re-evaluation of the following:  HPI: Stephanie [name] returns to this clinic in reevaluation of asthma and allergic rhinitis and reflux and a history of nonsteroidal anti-inflammatory drug induced anaphylaxis.  Her last visit to this clinic was 18 October 2020.  She had some difficulty with some of her medications over the course of the past 6 months.  Apparently she developed nausea with the use of benralizumab.  She has only used 1 injection of that agent.  She developed some type of "knot" in her right nostril that was bleeding for which she stopped her nebulized budesonide over the course of the past month or so.  But even in the face of not utilizing these treatments she has really done very well with her asthma and only had one viral induced flare up that was relatively short lived around Thanksgiving. She does not really exercise because of multiple musculoskeletal issues.  Her nose has been doing pretty well except for the issue with the "knot" as described above in the right nostril which has since resolved.  She has not required an antibiotic treating episode of sinusitis.  Her reflux is under very good control and she has been able to taper off her omeprazole at this point in time.  Allergies as of 06/20/2021       Reactions   Amoxicillin Rash   fash swelled up and itching 04/2018   Aspirin Anaphylaxis   Other Anaphylaxis   Nsaids   Hydrocodone Itching      Latex Hives, Itching   Oxycodone    itching   Levaquin [levofloxacin]    Rash on chest no hives 04/2018        Medication List    albuterol 108 (90 Base) MCG/ACT inhaler Commonly known as: Proventil  HFA Inhale 2 puffs into the lungs every 4 (four) hours as needed for wheezing or shortness of breath.   albuterol (2.5 MG/3ML) 0.083% nebulizer solution Commonly known as: PROVENTIL Take 3 mLs (2.5 mg total) by nebulization every 4 (four) hours as needed for wheezing or shortness of breath.   amLODipine 2.5 MG tablet Commonly known as: NORVASC Take 2 tablets (5 mg total) by mouth daily. What changed:  how much to take additional instructions   cetirizine 10 MG tablet Commonly known as: ZYRTEC Take 1 tablet (10 mg total) by mouth daily.   EPINEPHrine 0.3 mg/0.3 mL Soaj injection Commonly known as: EpiPen 2-Pak Inject 0.3 mg into the muscle once as needed for anaphylaxis (allergic reaction).   indapamide 1.25 MG tablet Commonly known as: LOZOL Take 1 tablet (1.25 mg total) by mouth daily.   ketoconazole 2 % cream Commonly known as: NIZORAL Apply to rash bid for 3 weeks   methocarbamol 500 MG tablet Commonly known as: ROBAXIN as needed.   potassium chloride SA 20 MEQ tablet Commonly known as: KLOR-CON M Take 1 tablet (20 mEq total) by mouth daily.   triamcinolone 55 MCG/ACT Aero nasal inhaler Commonly known as: NASACORT Place 1 spray into the nose daily. What changed:  when to take this reasons to take this   triamcinolone cream 0.1 % Commonly known as: KENALOG Apply 1 application topically 2 (  two) times daily.   True Metrix Blood Glucose Test test strip Generic drug: glucose blood SMARTSIG:Via Meter   TRUEplus Lancets 33G Misc   vitamin C 1000 MG tablet Take 1,000 mg by mouth daily.   Vitamin D3 50 MCG (2000 UT) Tabs Take by mouth daily at 6 (six) AM.   vitamin E 180 MG (400 UNITS) capsule Take 400 Units by mouth daily.   Zinc 50 MG Caps Take by mouth daily at 6 (six) AM.    Past Medical History:  Diagnosis Date   Allergic rhinitis    Arthritis    Asthma    Depression    GERD (gastroesophageal reflux disease)    Heart murmur    Hypertension     Pneumonia    hx of x 3     Past Surgical History:  Procedure Laterality Date   [place] Right 03/27/2017   Procedure: RIGHT TOTAL HIP ARTHROPLASTY ANTERIOR APPROACH;  Surgeon: Gaynelle Arabian, MD;  Location: [facility];  Service: Orthopedics;  Laterality: Right;   TOTAL HIP ARTHROPLASTY Left 08/17/2020   Procedure: TOTAL HIP ARTHROPLASTY ANTERIOR APPROACH;  Surgeon: Gaynelle Arabian, MD;  Location: [facility];  Service: Orthopedics;  Laterality: Left;  128min    Review of systems negative except as noted in HPI / PMHx or noted below:  Review of Systems  Constitutional: Negative.   HENT: Negative.    Eyes: Negative.   Respiratory: Negative.    Cardiovascular: Negative.   Gastrointestinal: Negative.   Genitourinary: Negative.   Musculoskeletal: Negative.   Skin: Negative.   Neurological: Negative.   Endo/Heme/Allergies: Negative.   Psychiatric/Behavioral: Negative.      Objective:   Vitals:   06/20/21 1143  BP: 132/90  Pulse: (!) 105  Resp: 16  Temp: 98 F (36.7 C)  SpO2: 98%   Height: 5\' 4"  (162.6 cm)  Weight: 199 lb 6.4 oz (90.4 kg)   Physical Exam Constitutional:      Appearance: She is not diaphoretic.  HENT:     Head: Normocephalic.     Right Ear: Tympanic membrane, ear canal and external ear normal.     Left Ear: Tympanic membrane, ear canal and external ear normal.     Nose: Nose normal. No mucosal edema or rhinorrhea.     Mouth/Throat:     Pharynx: Uvula midline. No oropharyngeal exudate.  Eyes:     Conjunctiva/sclera: Conjunctivae normal.  Neck:     Thyroid: No thyromegaly.     Trachea: Trachea normal. No tracheal tenderness or tracheal deviation.  Cardiovascular:     Rate and Rhythm: Normal rate and regular rhythm.     Heart sounds: Normal heart sounds, S1 normal and S2 normal. No murmur heard. Pulmonary:     Effort: No respiratory distress.     Breath sounds: Normal breath sounds. No  stridor. No wheezing or rales.  Lymphadenopathy:     Head:     Right side of head: No tonsillar adenopathy.     Left side of head: No tonsillar adenopathy.     Cervical: No cervical adenopathy.  Skin:    Findings: No erythema or rash.     Nails: There is no clubbing.  Neurological:     Mental Status: She is alert.    Diagnostics:    Spirometry was performed and demonstrated an FEV1 of 1.63 at 77 % of predicted.  Assessment and Plan:   1.  Asthma, moderate persistent, well-controlled   2. Perennial allergic rhinitis   3. Seasonal allergic rhinitis due to pollen   4. Gastroesophageal reflux disease, unspecified whether esophagitis present   5. Allergy to NSAIDs     1. Can restart the following if lower airway breathing problems:   A.  Budesonide 0.5 mg -nebulize 2 times per day  B.  Brovana -nebulize 1-2 times per day  C.  Albuterol HFA 2 puffs or albuterol nebulization every 4-6 hours    2. Can restart the following if upper airway problems   A. Nasacort - 1 spray each nostril 1 time per day  3. Can restart the following to Treat reflux:   A.  Omeprazole 40mg  1 tablet  time per day  4. Continue OTC antihistamine and Epi-Pen if needed  5. Return to clinic in 6 months or earlier if problem  Surprisingly, and fortunately, Shanine has done very well over the course of the past 6 months with minimal medications directed at her respiratory tract inflammation or her reflux.  She certainly has the option of restarting anti-inflammatory agents including agents directed against inflammation of both her upper and lower airway should it be required.  And she can always restart a proton pump inhibitor should it be required.  Assuming she does well with this plan I will see her back in this clinic in 6 months or earlier if there is a problem.  Allena Katz, MD [facility] [place]

## 2021-06-21 ENCOUNTER — Encounter: Payer: Self-pay | Admitting: Allergy and Immunology

## 2021-06-21 DIAGNOSIS — M1612 Unilateral primary osteoarthritis, left hip: Secondary | ICD-10-CM | POA: Diagnosis not present

## 2021-06-22 ENCOUNTER — Other Ambulatory Visit: Payer: Self-pay | Admitting: *Deleted

## 2021-06-22 MED ORDER — ARFORMOTEROL TARTRATE 15 MCG/2ML IN NEBU: 15.0000 ug | INHALATION_SOLUTION | Freq: Two times a day (BID) | RESPIRATORY_TRACT | 3 refills | Status: DC

## 2021-06-26 ENCOUNTER — Telehealth: Payer: Self-pay | Admitting: *Deleted

## 2021-06-26 NOTE — Telephone Encounter (Signed)
PA has been submitted through CoverMyMeds for Arformoterol Tartrate and is currently pending approval/denial.

## 2021-06-27 NOTE — Telephone Encounter (Signed)
PA has been approved for Arformoterol through patients Medicare part B. PA has been faxed to patients pharmacy, labeled, and placed in bulk scanning.

## 2021-06-28 NOTE — Progress Notes (Signed)
ASA 2. Appropriate.  ?

## 2021-06-29 ENCOUNTER — Telehealth: Payer: Self-pay | Admitting: Family Medicine

## 2021-06-29 NOTE — Progress Notes (Signed)
Pt called me back and informed me that she did not have a good experience at [facility] so she prefers to be done somewhere else.  Informed her to call her PCP and let them know that she prefers to go elsewhere.  Informed her that [facility] may be a choice for her.  She voiced understanding and thanked me.  Called PCP and informed them.

## 2021-06-29 NOTE — Progress Notes (Signed)
Lmom for pt to call me back. 

## 2021-06-29 NOTE — Telephone Encounter (Signed)
Patient requesting call back from Karle Starch to follow up on concerns recently discussed. Please advise at [phone].

## 2021-06-30 NOTE — Telephone Encounter (Signed)
Patient advised and is aware; said that's fine.

## 2021-07-05 ENCOUNTER — Ambulatory Visit (HOSPITAL_COMMUNITY): Payer: Medicare HMO | Admitting: Physical Therapy

## 2021-07-07 DIAGNOSIS — Z13220 Encounter for screening for lipoid disorders: Secondary | ICD-10-CM | POA: Diagnosis not present

## 2021-07-07 DIAGNOSIS — I10 Essential (primary) hypertension: Secondary | ICD-10-CM | POA: Diagnosis not present

## 2021-07-07 DIAGNOSIS — Z79899 Other long term (current) drug therapy: Secondary | ICD-10-CM | POA: Diagnosis not present

## 2021-07-08 LAB — HEPATIC FUNCTION PANEL
ALT: 20 IU/L (ref 0–32)
AST: 19 IU/L (ref 0–40)
Albumin: 4.3 g/dL (ref 3.8–4.9)
Alkaline Phosphatase: 115 IU/L (ref 44–121)
Bilirubin Total: 0.7 mg/dL (ref 0.0–1.2)
Bilirubin, Direct: 0.18 mg/dL (ref 0.00–0.40)
Total Protein: 6.9 g/dL (ref 6.0–8.5)

## 2021-07-08 LAB — BASIC METABOLIC PANEL
BUN/Creatinine Ratio: 16 (ref 9–23)
BUN: 10 mg/dL (ref 6–24)
CO2: 28 mmol/L (ref 20–29)
Calcium: 9.7 mg/dL (ref 8.7–10.2)
Chloride: 100 mmol/L (ref 96–106)
Creatinine, Ser: 0.63 mg/dL (ref 0.57–1.00)
Glucose: 91 mg/dL (ref 70–99)
Potassium: 3.7 mmol/L (ref 3.5–5.2)
Sodium: 141 mmol/L (ref 134–144)
eGFR: 102 mL/min/{1.73_m2} (ref >59)

## 2021-07-08 LAB — LIPID PANEL
Chol/HDL Ratio: 2.3 ratio (ref 0.0–4.4)
Cholesterol, Total: 154 mg/dL (ref 100–199)
HDL: 68 mg/dL (ref >39)
LDL Chol Calc (NIH): 69 mg/dL (ref 0–99)
Triglycerides: 89 mg/dL (ref 0–149)
VLDL Cholesterol Cal: 17 mg/dL (ref 5–40)

## 2021-07-10 ENCOUNTER — Encounter: Payer: Self-pay | Admitting: Family Medicine

## 2021-07-10 ENCOUNTER — Ambulatory Visit (INDEPENDENT_AMBULATORY_CARE_PROVIDER_SITE_OTHER): Payer: Medicare HMO | Admitting: Family Medicine

## 2021-07-10 ENCOUNTER — Other Ambulatory Visit: Payer: Self-pay

## 2021-07-10 VITALS — BP 118/78 | HR 94 | Temp 97.5°F | Ht 64.0 in | Wt 197.0 lb

## 2021-07-10 DIAGNOSIS — Z1211 Encounter for screening for malignant neoplasm of colon: Secondary | ICD-10-CM

## 2021-07-10 DIAGNOSIS — I10 Essential (primary) hypertension: Secondary | ICD-10-CM | POA: Diagnosis not present

## 2021-07-10 DIAGNOSIS — L989 Disorder of the skin and subcutaneous tissue, unspecified: Secondary | ICD-10-CM

## 2021-07-10 DIAGNOSIS — I8392 Asymptomatic varicose veins of left lower extremity: Secondary | ICD-10-CM

## 2021-07-10 MED ORDER — AMLODIPINE BESYLATE 2.5 MG PO TABS: 2.5000 mg | ORAL_TABLET | Freq: Every day | ORAL | 1 refills | Status: DC

## 2021-07-10 NOTE — Progress Notes (Signed)
° °  Subjective:    Patient ID: Stephanie [name], female    DOB: 02-07-1961, 61 y.o.   MRN: [id number]  Hypertension This is a chronic problem. Treatments tried: amlodipine, indapamide.  Essential hypertension  Skin lesion - Plan: Ambulatory referral to Dermatology  Varicose veins of left lower extremity, unspecified whether complicated - Plan: Ambulatory referral to Vascular Surgery  Screening for colon cancer - Plan: Ambulatory referral to Gastroenterology She states she has a skin lesion and a rash on her left foot this been bothering her for a period of time She also states she has been taking her blood pressure medicine trying to watch her diet trying to stay active She does have chronic pain in her hips from previous surgery but she feels she is doing fairly good with that We did discuss her varicose vein that she states is given her tenderness and pain in the left calf We also discussed screening for colon cancer and she would like to get referred to gastroenterology  Moods doing better Review of Systems     Objective:   Physical Exam General-in no acute distress Eyes-no discharge Lungs-respiratory rate normal, CTA CV-no murmurs,RRR Extremities skin warm dry no edema Neuro grossly normal Behavior normal, alert  Results for orders placed or performed in visit on 86/57/84  Basic Metabolic Panel (BMET)  Result Value Ref Range   Glucose 91 70 - 99 mg/dL   BUN 10 6 - 24 mg/dL   Creatinine, Ser 0.63 0.57 - 1.00 mg/dL   eGFR 102 >59 mL/min/1.73   BUN/Creatinine Ratio 16 9 - 23   Sodium 141 134 - 144 mmol/L   Potassium 3.7 3.5 - 5.2 mmol/L   Chloride 100 96 - 106 mmol/L   CO2 28 20 - 29 mmol/L   Calcium 9.7 8.7 - 10.2 mg/dL  Lipid Profile  Result Value Ref Range   Cholesterol, Total 154 100 - 199 mg/dL   Triglycerides 89 0 - 149 mg/dL   HDL 68 >39 mg/dL   VLDL Cholesterol Cal 17 5 - 40 mg/dL   LDL Chol Calc (NIH) 69 0 - 99 mg/dL   Chol/HDL Ratio 2.3 0.0 - 4.4 ratio   Hepatic function panel  Result Value Ref Range   Total Protein 6.9 6.0 - 8.5 g/dL   Albumin 4.3 3.8 - 4.9 g/dL   Bilirubin Total 0.7 0.0 - 1.2 mg/dL   Bilirubin, Direct 0.18 0.00 - 0.40 mg/dL   Alkaline Phosphatase 115 44 - 121 IU/L   AST 19 0 - 40 IU/L   ALT 20 0 - 32 IU/L         Assessment & Plan:  1. Essential hypertension Blood pressure good control tolerating medicine well no problems  2. Skin lesion Skin rash that shows some excoriation but also shows a skin lesion will refer the skin lesion for dermatology consult  3. Varicose veins of left lower extremity, unspecified whether complicated Varicose veins on the back of the left calf causing her some soreness and discomfort referral to vascular surgery Referral for colon cancer She is trying to be healthy with her eating habits She is walking with a cane She is disabled Has hip and knee pain

## 2021-07-10 NOTE — Patient Instructions (Signed)
Hi Stephanie [name]  It was good to see you today. Your blood pressure looks very good. Blood work also looked good as we discussed.  We will see you back in 4 to 5 months  Our staff will send information to the gastroenterologist in [place] for them to help set you up for colonoscopy.  They will also help get you set up for a dermatologist regarding the lesion on your top of your foot  We will also help set you up with the vascular doctor to help take care of the varicose vein  If you have further questions or concerns please let us know

## 2021-07-13 DIAGNOSIS — Z0000 Encounter for general adult medical examination without abnormal findings: Secondary | ICD-10-CM | POA: Diagnosis not present

## 2021-07-13 DIAGNOSIS — Z01419 Encounter for gynecological examination (general) (routine) without abnormal findings: Secondary | ICD-10-CM | POA: Diagnosis not present

## 2021-07-13 DIAGNOSIS — Z1231 Encounter for screening mammogram for malignant neoplasm of breast: Secondary | ICD-10-CM | POA: Diagnosis not present

## 2021-07-13 DIAGNOSIS — Z6833 Body mass index (BMI) 33.0-33.9, adult: Secondary | ICD-10-CM | POA: Diagnosis not present

## 2021-07-18 LAB — HM PAP SMEAR

## 2021-07-20 ENCOUNTER — Ambulatory Visit (HOSPITAL_COMMUNITY): Payer: Medicare HMO | Admitting: Physical Therapy

## 2021-07-31 DIAGNOSIS — Z1231 Encounter for screening mammogram for malignant neoplasm of breast: Secondary | ICD-10-CM | POA: Diagnosis not present

## 2021-07-31 DIAGNOSIS — Z01419 Encounter for gynecological examination (general) (routine) without abnormal findings: Secondary | ICD-10-CM | POA: Diagnosis not present

## 2021-08-10 ENCOUNTER — Ambulatory Visit (HOSPITAL_COMMUNITY): Payer: Medicare HMO | Attending: Physical Medicine and Rehabilitation

## 2021-08-10 DIAGNOSIS — R2689 Other abnormalities of gait and mobility: Secondary | ICD-10-CM | POA: Insufficient documentation

## 2021-08-10 DIAGNOSIS — M5459 Other low back pain: Secondary | ICD-10-CM | POA: Diagnosis not present

## 2021-08-10 LAB — HM MAMMOGRAPHY

## 2021-08-10 NOTE — Therapy (Signed)
OUTPATIENT PHYSICAL THERAPY THORACOLUMBAR EVALUATION   Patient Name: Stephanie [name] MRN: [id number] DOB:Jul 23, 1960, 61 y.o., female Today's Date: 08/10/2021   PT End of Session - 08/10/21 0949     Visit Number 1    Authorization Type [employer] Medicare HMO, No VL,             Past Medical History:  Diagnosis Date   Allergic rhinitis    Arthritis    Asthma    Depression    GERD (gastroesophageal reflux disease)    Heart murmur    Hypertension    Pneumonia    hx of x 3    Past Surgical History:  Procedure Laterality Date   CESAREAN SECTION  1985, 1989, 1995   TONSILLECTOMY     TOTAL HIP ARTHROPLASTY Right 03/27/2017   Procedure: RIGHT TOTAL HIP ARTHROPLASTY ANTERIOR APPROACH;  Surgeon: Ollen Gross, MD;  Location: [facility];  Service: Orthopedics;  Laterality: Right;   TOTAL HIP ARTHROPLASTY Left 08/17/2020   Procedure: TOTAL HIP ARTHROPLASTY ANTERIOR APPROACH;  Surgeon: Ollen Gross, MD;  Location: [facility];  Service: Orthopedics;  Laterality: Left;    Patient Active Problem List   Diagnosis Date Noted   History of revision of total replacement of left hip joint 12/13/2020   Varicose veins of left lower extremity 10/14/2020   Primary osteoarthritis of left hip 08/17/2020   Screening for colon cancer 03/07/2020   Post-menopausal bleeding 03/07/2020   Need for vaccination 03/07/2020   OA (osteoarthritis) of hip 03/27/2017   History of contact dermatitis 02/11/2017   Chronic pain syndrome 01/09/2017   Osteoarthritis, hip, bilateral 07/03/2015   Moderate persistent asthma 04/27/2015   Allergic rhinoconjunctivitis 04/27/2015   Right-sided low back pain with right-sided sciatica 02/22/2015   Essential hypertension 10/17/2012    PCP: Babs Sciara, MD  REFERRING PROVIDER: Sheran Luz, MD  REFERRING DIAG: PT eval and tx  [phone] Intervertebral disc degeneration, lumbar region Per Sheran Luz   THERAPY DIAG:  Other low back pain  ONSET DATE:  2017  SUBJECTIVE:                                                                                                                                                                                           SUBJECTIVE STATEMENT: Patient reports her back pain has been going on for years.  She recently saw Dr. Ethelene Hal; referred to him.  Ramos wanted to try injections but patient wants to try therapy inside.  She has had injections in her back before and it made her sick on her stomach; stomach cramps.   PERTINENT HISTORY:  Hip surgery both  2018 Right, 2022 Left per Dr. Lequita Halt  PAIN:  Are you having pain? Yes: NPRS scale: 3/10 Pain location: low back Pain description: throbbing Aggravating factors: sitting or standing for a while Relieving factors: CBD oil and cream, [place] gel   PRECAUTIONS: Fall  WEIGHT BEARING RESTRICTIONS No  FALLS:  Has patient fallen in last 6 months? No  LIVING ENVIRONMENT: Lives with: lives with their daughter Lives in: House/apartment Stairs: Yes: External: 1 steps; none Has following equipment at home: Single point cane, Walker - 2 wheeled, shower chair, bed side commode, and Grab bars  OCCUPATION: none  PLOF: Independent  PATIENT GOALS to be able to do what I want to do without my back stopping me.   OBJECTIVE:   DIAGNOSTIC FINDINGS:  MRI DDD  PATIENT SURVEYS:  FOTO 30  SCREENING FOR RED FLAGS: Bowel or bladder incontinence: No Spinal tumors: No Cauda equina syndrome: No Compression fracture: No Abdominal aneurysm: No  COGNITION:  Overall cognitive status: Within functional limits for tasks assessed     SENSATION: WFL   POSTURE:  Guarding with all movment  PALPATION: Tender L5-S1 paraspinals and piriformis bilat  LUMBAR ROM:   Active  A/PROM  08/10/2021  Flexion 80% available; pulling some  Extension 30% available  Right lateral flexion 75% available  Left lateral flexion 75% available  Right rotation 80% available  Left  rotation 80% available   (Blank rows = not tested)   LE MMT:  MMT Right 08/10/2021 Left 08/10/2021  Hip flexion 4+/5 painful 4+/5  painful   Hip extension    Hip abduction    Hip adduction    Hip internal rotation    Hip external rotation    Knee flexion 5/5 5/5  Knee extension 5/5 5/5  Ankle dorsiflexion 5/5 5/5  Ankle plantarflexion    Ankle inversion    Ankle eversion     (Blank rows = not tested)   FUNCTIONAL TESTS:  5 times sit to stand: 1:35  GAIT: Distance walked: 50 Assistive device utilized: None Level of assistance: SBA Comments: slow antalgic gait; states she uses a cane sometimes when she is having a bad day    TODAY'S TREATMENT  Physical therapy evaluation, HEP instruction   PATIENT EDUCATION:  Education details: HEP instruction Person educated: Patient Education method: Programmer, multimedia, Facilities manager, Verbal cues, and Handouts Education comprehension: verbalized understanding, returned demonstration, and needs further education   HOME EXERCISE PROGRAM: Access Code: [email or web address] URL: [email or web address] Date: 08/10/2021 Prepared by: [facility] - Rehab  Exercises - Lying Prone  - 3 x daily - 7 x weekly - 1 sets - 1 reps - 1 min  hold - Static Prone on Elbows  - 3 x daily - 7 x weekly - 1 sets - 1 reps - 1 min hold - Seated Hamstring Stretch with Strap  - 3 x daily - 7 x weekly - 1 sets - 5 reps  Prone lying 1 min Prone prop x 1 min Hamstring stretch 5 x 20" sitting with a strap  ASSESSMENT:  CLINICAL IMPRESSION: Patient is a 61 y.o. female who was seen today for physical therapy evaluation and treatment for low back pain. She presents with back pain with all movements and noticeable decreased back extension. Her leg strength is fairly good just has some pain with movements that testing requires. Patient is unable to lie on her back today and has trouble with all bed mobility; all movement aggravates her back pain. Patient will benefit from  skilled PT interventions  to address low back pain, decreased lumbar mobility and core weakness, LE weakness, transfer and gait training and the instruction, development and modification of HEP.   OBJECTIVE IMPAIRMENTS Abnormal gait, decreased activity tolerance, decreased balance, decreased cognition, decreased coordination, decreased endurance, decreased knowledge of condition, decreased knowledge of use of DME, decreased mobility, difficulty walking, decreased ROM, decreased strength, decreased safety awareness, hypomobility, impaired perceived functional ability, increased muscle spasms, impaired flexibility, and pain.   ACTIVITY LIMITATIONS cleaning, community activity, driving, meal prep, laundry, yard work, shopping, yard work, and church.   PERSONAL FACTORS Age and Fitness are also affecting patient's functional outcome.    REHAB POTENTIAL: Good  CLINICAL DECISION MAKING: Stable/uncomplicated  EVALUATION COMPLEXITY: Low   GOALS: Goals reviewed with patient? Yes  SHORT TERM GOALS: Target date: 08/24/2021   Patient with be independent with initial HEP to  improve functional outcomes.  Goal status: INITIAL  2.  Patient will improve lumbar extension to 50% available to improve posturing with ambulation and standing. Baseline: 30% Goal status: INITIAL  LONG TERM GOALS: Target date: 09/07/2021  Patient will be independent in self management strategies to improve quality of life and functional outcomes.   Baseline:  Goal status: INITIAL  2.   Patient will be independent with advanced HEP and self management strategies to improve quality of life and functional outcomes.   Goal status: INITIAL  3.  Patient will meet predicted FOTO score to demonstrate improved overall function.  Baseline: 30 Goal status: INITIAL  4.  Patient will report at least 50% improvement in overall symptoms and/or function to demonstrate improved functional mobility   Goal status: INITIAL  5.   Patient will improve 5 x STS score from 1:35 sec to 45 sec to demonstrate improved functional mobility and increased lower extremity strength.  Baseline: 1:35 sec Goal status: INITIAL  6.  Patient will walk x 30 min for fitness/exercise without pain causing her to stop    Goal status: INITIAL   PLAN: PT FREQUENCY: 1x/week  PT DURATION: 4 weeks  PLANNED INTERVENTIONS: Therapeutic exercises, Therapeutic activity, Neuromuscular re-education, Balance training, Gait training, Patient/Family education, Joint manipulation, Joint mobilization, Stair training, Vestibular training, DME instructions, Aquatic Therapy, Dry Needling, Electrical stimulation, Spinal manipulation, Spinal mobilization, Cryotherapy, Moist heat, Taping, Traction, Ultrasound, Contrast bath, Biofeedback, and Manual therapy.  PLAN FOR NEXT SESSION: Review HEP and goals   11:10 AM, 08/10/21 Stephon Weathers Small Dolores Mcgovern MPT [place] [facility] [place] [phone]

## 2021-08-15 ENCOUNTER — Encounter (HOSPITAL_COMMUNITY): Payer: Medicare HMO | Admitting: Physical Therapy

## 2021-08-17 DIAGNOSIS — S8002XA Contusion of left knee, initial encounter: Secondary | ICD-10-CM | POA: Diagnosis not present

## 2021-08-17 DIAGNOSIS — M160 Bilateral primary osteoarthritis of hip: Secondary | ICD-10-CM | POA: Diagnosis not present

## 2021-08-17 DIAGNOSIS — Z96643 Presence of artificial hip joint, bilateral: Secondary | ICD-10-CM | POA: Diagnosis not present

## 2021-08-17 DIAGNOSIS — M25562 Pain in left knee: Secondary | ICD-10-CM | POA: Diagnosis not present

## 2021-08-17 DIAGNOSIS — M5451 Vertebrogenic low back pain: Secondary | ICD-10-CM | POA: Diagnosis not present

## 2021-08-23 ENCOUNTER — Other Ambulatory Visit: Payer: Self-pay | Admitting: Allergy and Immunology

## 2021-08-24 ENCOUNTER — Ambulatory Visit (INDEPENDENT_AMBULATORY_CARE_PROVIDER_SITE_OTHER): Payer: Medicare HMO | Admitting: Family Medicine

## 2021-08-24 ENCOUNTER — Telehealth (HOSPITAL_COMMUNITY): Payer: Self-pay

## 2021-08-24 VITALS — BP 130/84 | HR 88 | Temp 98.1°F | Ht 64.0 in | Wt 202.0 lb

## 2021-08-24 DIAGNOSIS — S8392XD Sprain of unspecified site of left knee, subsequent encounter: Secondary | ICD-10-CM | POA: Diagnosis not present

## 2021-08-24 MED ORDER — OXYCODONE-ACETAMINOPHEN 5-325 MG PO TABS
1.0000 | ORAL_TABLET | Freq: Four times a day (QID) | ORAL | 0 refills | Status: AC | PRN
Start: 2021-08-24 — End: 2021-08-29

## 2021-08-24 NOTE — Progress Notes (Signed)
? ?  Subjective:  ? ? Patient ID: Stephanie [name], female    DOB: 03-Jun-1960, 61 y.o.   MRN: [id number] ? ?Hip Pain  ?The incident occurred 5 to 7 days ago. The pain is at a severity of 7/10. The pain has been Intermittent since onset.  ?Fall follow up 4/5 /23 - pain 7/10, numbness, swelling , stiffness , throbbing, stinging back, hip, legs, worse with moving , req re-evaluation hx hip surgery x 1 yr ?Taking tyl/ ibu- refill muscle relaxer  ? ? ?Review of Systems ? ?   ?Objective:  ? Physical Exam ?Bruising noted in the left knee ligaments stable hips are sore low back sore lungs clear heart regular ? ? ? ?   ?Assessment & Plan:  ? ?I believe the patient would benefit from physical therapy.  She will see her hip specialist next week she will talk with them to make sure it is okay with him that we do physical therapy if so she will let us know and we will set her up locally for physical therapy ?She has a left knee sprain she also has back contusions hip contusions related to the fall ?X-rays were done outside agency unable to review but the best I can tell from the doctor note the x-rays were okay ? ?Patient states pain is severe/we did discuss pain options we will go ahead with oxycodone 5/325 mg 1 every 4 hours as needed severe pain caution drowsiness not for frequent use short-term use only ?

## 2021-08-25 ENCOUNTER — Encounter: Payer: Self-pay | Admitting: Gastroenterology

## 2021-08-25 ENCOUNTER — Encounter (HOSPITAL_COMMUNITY): Payer: Medicare HMO

## 2021-08-30 ENCOUNTER — Encounter (HOSPITAL_COMMUNITY): Payer: Medicare HMO

## 2021-08-31 DIAGNOSIS — Z96643 Presence of artificial hip joint, bilateral: Secondary | ICD-10-CM | POA: Diagnosis not present

## 2021-08-31 DIAGNOSIS — M25562 Pain in left knee: Secondary | ICD-10-CM | POA: Diagnosis not present

## 2021-09-01 ENCOUNTER — Encounter: Payer: Medicare HMO | Admitting: Vascular Surgery

## 2021-09-01 ENCOUNTER — Ambulatory Visit (HOSPITAL_COMMUNITY): Payer: Medicare HMO

## 2021-09-05 ENCOUNTER — Encounter (HOSPITAL_COMMUNITY): Payer: Medicare HMO

## 2021-09-19 NOTE — Telephone Encounter (Signed)
Pt stated she fell and wants to go see her PCP before she comes to therapy. Appt canceled for tomorrow.  ?

## 2021-09-25 ENCOUNTER — Telehealth: Payer: Self-pay | Admitting: *Deleted

## 2021-09-25 NOTE — Telephone Encounter (Signed)
Patient was rescheduled for 5/17 at 4:30 ?

## 2021-09-25 NOTE — Telephone Encounter (Signed)
Patient no show/answer PV appointment for today. I called patient x3, no answer, left a message for the patient to call us back today before 5 pm or the PV and procedure will be cancelled. ? ?

## 2021-09-27 ENCOUNTER — Telehealth: Payer: Self-pay | Admitting: Dermatology

## 2021-09-27 ENCOUNTER — Ambulatory Visit (AMBULATORY_SURGERY_CENTER): Payer: Medicare HMO | Admitting: *Deleted

## 2021-09-27 VITALS — Ht 64.0 in | Wt 200.0 lb

## 2021-09-27 DIAGNOSIS — Z1211 Encounter for screening for malignant neoplasm of colon: Secondary | ICD-10-CM

## 2021-09-27 MED ORDER — CLENPIQ 10-3.5-12 MG-GM -GM/160ML PO SOLN: 1.0000 | ORAL | 0 refills | Status: DC

## 2021-09-27 NOTE — Telephone Encounter (Signed)
Patient is calling for a referral appointment from Lilyan Punt, M.D.  Patient is scheduled for 05/28/2021 at 11:30 with Janalyn Harder, M.D. ?

## 2021-09-27 NOTE — Progress Notes (Signed)
Patient's pre-visit was done today over the phone with the patient. Name,DOB and address verified. Patient denies any allergies to Eggs and Soy. Patient denies any problems with anesthesia/sedation. Patient is not taking any diet pills or blood thinners. No home Oxygen. Insurance confirmed with patient. ? ?Prep instructions sent to pt's MyChart (if available) & mailed to pt-pt is aware. Patient understands to call us back with any questions or concerns. Patient is aware of our care-partner policy.  ? ?EMMI education assigned to the patient for the procedure, sent to MyChart.  ? ?The patient is COVID-19 vaccinated.   ?

## 2021-10-12 ENCOUNTER — Telehealth: Payer: Self-pay | Admitting: Gastroenterology

## 2021-10-16 ENCOUNTER — Encounter: Payer: Medicare HMO | Admitting: Gastroenterology

## 2021-10-17 ENCOUNTER — Other Ambulatory Visit: Payer: Self-pay | Admitting: *Deleted

## 2021-10-17 DIAGNOSIS — I8392 Asymptomatic varicose veins of left lower extremity: Secondary | ICD-10-CM

## 2021-10-18 ENCOUNTER — Telehealth: Payer: Self-pay | Admitting: Gastroenterology

## 2021-10-18 NOTE — Telephone Encounter (Signed)
Revised instructions will be sent to the patient's MyChart as requested by the patient with the updated procedure date/time;

## 2021-10-18 NOTE — Telephone Encounter (Signed)
Inbound call from patient resch'd for procedure on 12/13/21 at 11:30 am . Patient has already had their previst 09/27/21. Patient needs updated prep paperwork. Patient is ok with receiving prep paperwork in [place].   Thank You

## 2021-10-25 ENCOUNTER — Encounter: Payer: Self-pay | Admitting: Vascular Surgery

## 2021-10-25 ENCOUNTER — Ambulatory Visit: Payer: Medicare HMO | Admitting: Vascular Surgery

## 2021-10-25 ENCOUNTER — Ambulatory Visit (HOSPITAL_COMMUNITY)
Admission: RE | Admit: 2021-10-25 | Discharge: 2021-10-25 | Disposition: A | Discharge status: Home / Self Care | Payer: Medicare HMO | Source: Ambulatory Visit | Attending: Vascular Surgery | Admitting: Vascular Surgery

## 2021-10-25 VITALS — BP 138/89 | HR 74 | Temp 98.0°F | Resp 18 | Ht 63.0 in | Wt 202.3 lb

## 2021-10-25 DIAGNOSIS — I8392 Asymptomatic varicose veins of left lower extremity: Secondary | ICD-10-CM | POA: Diagnosis not present

## 2021-10-25 DIAGNOSIS — I83812 Varicose veins of left lower extremities with pain: Secondary | ICD-10-CM

## 2021-10-25 NOTE — Progress Notes (Signed)
ASSESSMENT & PLAN   CHRONIC VENOUS DISEASE: This patient has CEAP C1 venous disease (telangiectasias).  She does not have significant symptoms from venous hypertension.  We have discussed the importance of daily leg elevation and the proper positioning for this.  I have encouraged her to continue to wear her knee-high compression stockings which she has.  We discussed the importance of exercise specifically walking and water aerobics.  I have encouraged her to avoid prolonged sitting and standing.  We also discussed the importance of maintaining a healthy weight as central obesity especially increases lower extremity venous pressure.  If her symptoms progress in the future then certainly we can reevaluate.  REASON FOR CONSULT:    Varicose veins left lower extremity.  The consult is requested by Dr. Sallee Lange.  HPI:   Stephanie [name] is a 61 y.o. female who was referred with varicose veins of the left lower extremity.  The patient states that she has had some small spider veins for years.  She describes some occasional aching pain and heaviness in her legs which is aggravated by sitting and standing.  Her symptoms are relieved with elevation.  She does sometimes wear knee-high compression stockings which help.  She denies any previous history of DVT.  She had no previous history of phlebitis.  She had no previous venous procedures.  Past Medical History:  Diagnosis Date   Allergic rhinitis    Allergy    Arthritis    Asthma    Depression    GERD (gastroesophageal reflux disease)    Heart murmur    Hypertension    Pneumonia    hx of x 3     Family History  Problem Relation Age of Onset   Hypertension Mother    Hypertension Father    Stomach cancer Niece    Breast cancer Niece    Colon cancer Neg Hx    Esophageal cancer Neg Hx    Rectal cancer Neg Hx     SOCIAL HISTORY: Social History   Tobacco Use   Smoking status: Former    Packs/day: 0.25    Years: 18.00    Total  pack years: 4.50    Types: Cigarettes    Start date: 09/24/1994    Quit date: 03/06/2013    Years since quitting: 8.6   Smokeless tobacco: Never  Substance Use Topics   Alcohol use: Yes    Alcohol/week: 2.0 - 3.0 standard drinks of alcohol    Types: 2 - 3 Glasses of wine per week    Comment: red wine    Allergies  Allergen Reactions   Amoxicillin Rash    fash swelled up and itching 04/2018   Aspirin Anaphylaxis   Other Anaphylaxis    Nsaids   Hydrocodone Itching        Latex Hives and Itching   Oxycodone     itching   Levaquin [Levofloxacin]     Rash on chest no hives 04/2018    Current Outpatient Medications  Medication Sig Dispense Refill   albuterol (PROVENTIL HFA) 108 (90 Base) MCG/ACT inhaler Inhale 2 puffs into the lungs every 4 (four) hours as needed for wheezing or shortness of breath. 18 g 5   albuterol (PROVENTIL) (2.5 MG/3ML) 0.083% nebulizer solution TAKE 52m BY NEBULIZER EVERY 4 HOURS AS NEEDED FOR WHEEZING OR SHORTNESS OF BREATH 180 mL 1   amLODipine (NORVASC) 2.5 MG tablet Take 1 tablet (2.5 mg total) by mouth daily. Takes 1 tablet 2.5  mg daily. 90 tablet 1   arformoterol (BROVANA) 15 MCG/2ML NEBU Take 2 mLs (15 mcg total) by nebulization 2 (two) times daily. 200 mL 3   Ascorbic Acid (VITAMIN C) 1000 MG tablet Take 1,000 mg by mouth daily.     budesonide (PULMICORT) 0.5 MG/2ML nebulizer solution Take 2 mLs (0.5 mg total) by nebulization 2 (two) times daily as needed. 200 mL 3   cetirizine (ZYRTEC) 10 MG tablet Take 1 tablet (10 mg total) by mouth daily. 30 tablet 5   Cholecalciferol (VITAMIN D3) 50 MCG (2000 UT) TABS Take by mouth daily at 6 (six) AM.     EPINEPHrine (EPIPEN 2-PAK) 0.3 mg/0.3 mL IJ SOAJ injection Inject 0.3 mg into the muscle once as needed for anaphylaxis (allergic reaction). 2 each 1   GARLIC PO Take by mouth.     indapamide (LOZOL) 1.25 MG tablet Take 1 tablet (1.25 mg total) by mouth daily. 90 tablet 1   ketoconazole (NIZORAL) 2 % cream  Apply to rash bid for 3 weeks (Patient taking differently: Apply 1 application  topically daily as needed (Rash).) 30 g 3   methocarbamol (ROBAXIN) 500 MG tablet as needed.     omeprazole (PRILOSEC) 40 MG capsule Take 1 capsule (40 mg total) by mouth daily. 30 capsule 5   potassium chloride SA (KLOR-CON M) 20 MEQ tablet Take 1 tablet (20 mEq total) by mouth daily. 90 tablet 1   Sod Picosulfate-Mag Ox-Cit Acd (CLENPIQ) 10-3.5-12 MG-GM -GM/160ML SOLN Take 1 kit by mouth as directed. 320 mL 0   triamcinolone (NASACORT) 55 MCG/ACT AERO nasal inhaler Place 1 spray into the nose daily. 16.9 g 5   triamcinolone cream (KENALOG) 0.1 % Apply 1 application topically 2 (two) times daily. (Patient taking differently: Apply 1 application  topically as needed.) 30 g 0   TRUE METRIX BLOOD GLUCOSE TEST test strip SMARTSIG:Via Meter     TRUEplus Lancets 33G MISC      vitamin E 180 MG (400 UNITS) capsule Take 400 Units by mouth daily.     Zinc 50 MG CAPS Take by mouth daily at 6 (six) AM.     Current Facility-Administered Medications  Medication Dose Route Frequency Provider Last Rate Last Admin   Benralizumab SOSY 30 mg  30 mg Subcutaneous Q28 days Valentina Shaggy, MD   30 mg at 10/18/20 1542    REVIEW OF SYSTEMS:  [X]  denotes positive finding, [ ]  denotes negative finding Cardiac  Comments:  Chest pain or chest pressure:    Shortness of breath upon exertion: x   Short of breath when lying flat: x   Irregular heart rhythm:        Vascular    Pain in calf, thigh, or hip brought on by ambulation: x   Pain in feet at night that wakes you up from your sleep:     Blood clot in your veins:    Leg swelling:  x       Pulmonary    Oxygen at home:    Productive cough:     Wheezing:  x       Neurologic    Sudden weakness in arms or legs:     Sudden numbness in arms or legs:     Sudden onset of difficulty speaking or slurred speech:    Temporary loss of vision in one eye:     Problems with  dizziness:         Gastrointestinal    Blood  in stool:     Vomited blood:         Genitourinary    Burning when urinating:     Blood in urine:        Psychiatric    Major depression:         Hematologic    Bleeding problems:    Problems with blood clotting too easily:        Skin    Rashes or ulcers:        Constitutional    Fever or chills:    -  PHYSICAL EXAM:   Vitals:   10/25/21 1306  BP: 138/89  Pulse: 74  Resp: 18  Temp: 98 F (36.7 C)  TempSrc: Temporal  SpO2: 99%  Weight: 202 lb 4.8 oz (91.8 kg)  Height: 5' 3"  (1.6 m)   Body mass index is 35.84 kg/m. GENERAL: The patient is a well-nourished female, in no acute distress. The vital signs are documented above. CARDIAC: There is a regular rate and rhythm.  VASCULAR: I do not detect carotid bruits. She has palpable dorsalis pedis pulses bilaterally. She has a biphasic dorsalis pedis and posterior tibial signal bilaterally. She has no significant lower extremity swelling. PULMONARY: There is good air exchange bilaterally without wheezing or rales. ABDOMEN: Soft and non-tender with normal pitched bowel sounds.  MUSCULOSKELETAL: There are no major deformities. NEUROLOGIC: No focal weakness or paresthesias are detected. SKIN: There are no ulcers or rashes noted. PSYCHIATRIC: The patient has a normal affect.  DATA:    VENOUS DUPLEX: I have independently interpreted the patient's venous duplex scan today.  This was of the left lower extremity only.  There was no evidence of DVT.  There was deep venous reflux in the common femoral vein.  There was no significant superficial venous reflux.  Deitra Mayo [facility]

## 2021-11-11 ENCOUNTER — Other Ambulatory Visit: Payer: Self-pay | Admitting: Family Medicine

## 2021-11-28 ENCOUNTER — Emergency Department (HOSPITAL_COMMUNITY): Payer: Medicare HMO

## 2021-11-28 ENCOUNTER — Other Ambulatory Visit (HOSPITAL_COMMUNITY)
Admission: RE | Admit: 2021-11-28 | Discharge: 2021-11-28 | Disposition: A | Discharge status: Home / Self Care | Payer: Medicare HMO | Source: Ambulatory Visit | Attending: Nurse Practitioner | Admitting: Nurse Practitioner

## 2021-11-28 ENCOUNTER — Ambulatory Visit (INDEPENDENT_AMBULATORY_CARE_PROVIDER_SITE_OTHER): Payer: Medicare HMO | Admitting: Nurse Practitioner

## 2021-11-28 ENCOUNTER — Encounter: Payer: Self-pay | Admitting: Nurse Practitioner

## 2021-11-28 ENCOUNTER — Other Ambulatory Visit: Payer: Self-pay | Admitting: Family Medicine

## 2021-11-28 ENCOUNTER — Ambulatory Visit (HOSPITAL_COMMUNITY)
Admission: RE | Admit: 2021-11-28 | Discharge: 2021-11-28 | Disposition: A | Discharge status: Home / Self Care | Payer: Medicare HMO | Source: Ambulatory Visit | Attending: Nurse Practitioner | Admitting: Nurse Practitioner

## 2021-11-28 ENCOUNTER — Other Ambulatory Visit: Payer: Self-pay

## 2021-11-28 ENCOUNTER — Emergency Department (HOSPITAL_COMMUNITY)
Admission: EM | Admit: 2021-11-28 | Discharge: 2021-11-29 | Disposition: A | Discharge status: Home / Self Care | Payer: Medicare HMO | Attending: Emergency Medicine | Admitting: Emergency Medicine

## 2021-11-28 ENCOUNTER — Encounter (HOSPITAL_COMMUNITY): Payer: Self-pay

## 2021-11-28 ENCOUNTER — Other Ambulatory Visit: Payer: Self-pay | Admitting: Nurse Practitioner

## 2021-11-28 VITALS — BP 140/89 | HR 83 | Temp 97.5°F

## 2021-11-28 DIAGNOSIS — I2693 Single subsegmental pulmonary embolism without acute cor pulmonale: Secondary | ICD-10-CM | POA: Diagnosis not present

## 2021-11-28 DIAGNOSIS — R9431 Abnormal electrocardiogram [ECG] [EKG]: Secondary | ICD-10-CM | POA: Diagnosis not present

## 2021-11-28 DIAGNOSIS — Z7901 Long term (current) use of anticoagulants: Secondary | ICD-10-CM | POA: Diagnosis not present

## 2021-11-28 DIAGNOSIS — Z91040 Latex allergy status: Secondary | ICD-10-CM | POA: Diagnosis not present

## 2021-11-28 DIAGNOSIS — M79604 Pain in right leg: Secondary | ICD-10-CM | POA: Diagnosis not present

## 2021-11-28 DIAGNOSIS — J45909 Unspecified asthma, uncomplicated: Secondary | ICD-10-CM | POA: Insufficient documentation

## 2021-11-28 DIAGNOSIS — I10 Essential (primary) hypertension: Secondary | ICD-10-CM | POA: Insufficient documentation

## 2021-11-28 DIAGNOSIS — I824Y1 Acute embolism and thrombosis of unspecified deep veins of right proximal lower extremity: Secondary | ICD-10-CM | POA: Diagnosis not present

## 2021-11-28 DIAGNOSIS — R0602 Shortness of breath: Secondary | ICD-10-CM | POA: Diagnosis present

## 2021-11-28 DIAGNOSIS — M1711 Unilateral primary osteoarthritis, right knee: Secondary | ICD-10-CM | POA: Diagnosis not present

## 2021-11-28 DIAGNOSIS — I82401 Acute embolism and thrombosis of unspecified deep veins of right lower extremity: Secondary | ICD-10-CM | POA: Diagnosis not present

## 2021-11-28 DIAGNOSIS — M25552 Pain in left hip: Secondary | ICD-10-CM | POA: Diagnosis not present

## 2021-11-28 DIAGNOSIS — M7989 Other specified soft tissue disorders: Secondary | ICD-10-CM | POA: Insufficient documentation

## 2021-11-28 DIAGNOSIS — R42 Dizziness and giddiness: Secondary | ICD-10-CM | POA: Diagnosis not present

## 2021-11-28 DIAGNOSIS — M25551 Pain in right hip: Secondary | ICD-10-CM | POA: Diagnosis not present

## 2021-11-28 LAB — BRAIN NATRIURETIC PEPTIDE
B Natriuretic Peptide: 19 pg/mL (ref 0.0–100.0)
B Natriuretic Peptide: 47 pg/mL (ref 0.0–100.0)

## 2021-11-28 LAB — CBC WITH DIFFERENTIAL/PLATELET
Abs Immature Granulocytes: 0.01 10*3/uL (ref 0.00–0.07)
Abs Immature Granulocytes: 0.02 10*3/uL (ref 0.00–0.07)
Basophils Absolute: 0.1 10*3/uL (ref 0.0–0.1)
Basophils Absolute: 0.1 10*3/uL (ref 0.0–0.1)
Basophils Relative: 1 %
Basophils Relative: 1 %
Eosinophils Absolute: 0.3 10*3/uL (ref 0.0–0.5)
Eosinophils Absolute: 0.4 10*3/uL (ref 0.0–0.5)
Eosinophils Relative: 5 %
Eosinophils Relative: 4 %
HCT: 44.7 % (ref 36.0–46.0)
HCT: 45.7 % (ref 36.0–46.0)
Hemoglobin: 14.8 g/dL (ref 12.0–15.0)
Hemoglobin: 15 g/dL (ref 12.0–15.0)
Immature Granulocytes: 0 %
Immature Granulocytes: 0 %
Lymphocytes Relative: 32 %
Lymphocytes Relative: 34 %
Lymphs Abs: 2.2 10*3/uL (ref 0.7–4.0)
Lymphs Abs: 2.9 10*3/uL (ref 0.7–4.0)
MCH: 29.8 pg (ref 26.0–34.0)
MCH: 30 pg (ref 26.0–34.0)
MCHC: 33.1 g/dL (ref 30.0–36.0)
MCHC: 32.8 g/dL (ref 30.0–36.0)
MCV: 90.1 fL (ref 80.0–100.0)
MCV: 91.4 fL (ref 80.0–100.0)
Monocytes Absolute: 0.5 10*3/uL (ref 0.1–1.0)
Monocytes Absolute: 0.5 10*3/uL (ref 0.1–1.0)
Monocytes Relative: 7 %
Monocytes Relative: 6 %
Neutro Abs: 3.9 10*3/uL (ref 1.7–7.7)
Neutro Abs: 4.7 10*3/uL (ref 1.7–7.7)
Neutrophils Relative %: 55 %
Neutrophils Relative %: 55 %
Platelets: 288 10*3/uL (ref 150–400)
Platelets: 278 10*3/uL (ref 150–400)
RBC: 4.96 MIL/uL (ref 3.87–5.11)
RBC: 5 MIL/uL (ref 3.87–5.11)
RDW: 14.7 % (ref 11.5–15.5)
RDW: 14.9 % (ref 11.5–15.5)
WBC: 6.8 10*3/uL (ref 4.0–10.5)
WBC: 8.6 10*3/uL (ref 4.0–10.5)
nRBC: 0 % (ref 0.0–0.2)
nRBC: 0 % (ref 0.0–0.2)

## 2021-11-28 LAB — COMPREHENSIVE METABOLIC PANEL
ALT: 19 U/L (ref 0–44)
AST: 17 U/L (ref 15–41)
Albumin: 4.3 g/dL (ref 3.5–5.0)
Alkaline Phosphatase: 89 U/L (ref 38–126)
Anion gap: 8 (ref 5–15)
BUN: 9 mg/dL (ref 6–20)
CO2: 26 mmol/L (ref 22–32)
Calcium: 9.4 mg/dL (ref 8.9–10.3)
Chloride: 106 mmol/L (ref 98–111)
Creatinine, Ser: 0.56 mg/dL (ref 0.44–1.00)
GFR, Estimated: >60 mL/min (ref >60)
Glucose, Bld: 83 mg/dL (ref 70–99)
Potassium: 3.6 mmol/L (ref 3.5–5.1)
Sodium: 140 mmol/L (ref 135–145)
Total Bilirubin: 0.9 mg/dL (ref 0.3–1.2)
Total Protein: 7.9 g/dL (ref 6.5–8.1)

## 2021-11-28 LAB — BASIC METABOLIC PANEL
Anion gap: 11 (ref 5–15)
BUN: 7 mg/dL (ref 6–20)
CO2: 25 mmol/L (ref 22–32)
Calcium: 9.2 mg/dL (ref 8.9–10.3)
Chloride: 103 mmol/L (ref 98–111)
Creatinine, Ser: 0.52 mg/dL (ref 0.44–1.00)
GFR, Estimated: >60 mL/min (ref >60)
Glucose, Bld: 90 mg/dL (ref 70–99)
Potassium: 3.4 mmol/L — ABNORMAL LOW (ref 3.5–5.1)
Sodium: 139 mmol/L (ref 135–145)

## 2021-11-28 LAB — TROPONIN I (HIGH SENSITIVITY): Troponin I (High Sensitivity): 7 ng/L (ref <18)

## 2021-11-28 LAB — D-DIMER, QUANTITATIVE: D-Dimer, Quant: 1.12 ug/mL-FEU — ABNORMAL HIGH (ref 0.00–0.50)

## 2021-11-28 MED ORDER — RIVAROXABAN 20 MG PO TABS: 20.0000 mg | ORAL_TABLET | Freq: Every day | ORAL | Status: DC

## 2021-11-28 MED ORDER — IOHEXOL 350 MG/ML SOLN
100.0000 mL | Freq: Once | INTRAVENOUS | Status: AC | PRN
  Administered 2021-11-28: 100 mL via INTRAVENOUS

## 2021-11-28 MED ORDER — RIVAROXABAN 15 MG PO TABS
15.0000 mg | ORAL_TABLET | Freq: Two times a day (BID) | ORAL | Status: DC
  Administered 2021-11-28: 15 mg via ORAL

## 2021-11-28 MED ORDER — RIVAROXABAN (XARELTO) VTE STARTER PACK (15 & 20 MG): ORAL_TABLET | ORAL | 0 refills | Status: DC

## 2021-11-28 MED ORDER — RIVAROXABAN 15 MG PO TABS: 15.0000 mg | ORAL_TABLET | Freq: Two times a day (BID) | ORAL | Status: DC

## 2021-11-28 NOTE — Discharge Instructions (Addendum)
You are seen in the ER for chest pain, shortness of breath in the setting of being found to have a blood clot in your leg.  The CT scan of your lungs does show a very small blood clot.  However, your heart function and your lung function has remained normal and stable.  You do not need admission to the hospital.  You will need blood thinners for your blood clots. If you have received first dose of the blood thinner while in the ER, along with the prescription for the medication.  You will need to see your primary care doctor with a diagnosis of blood clot in your lungs, to ensure that additional medications are prescribed by them.  Read instructions on the blood thinner.  Please be aware, if you have any falls or trauma -you should come to the ER to be assessed.  Also if you start having bleeding, please come to the ER for assessment if the bleeding does not stop with simple home measures.   ------------------------------------------------------------------------------------------------------------------------------------------------------------------------ Information on my medicine - XARELTO (rivaroxaban)  This medication education was reviewed with me or my healthcare representative as part of my discharge preparation.    WHY WAS XARELTO PRESCRIBED FOR YOU? Xarelto was prescribed to treat blood clots that may have been found in the veins of your legs (deep vein thrombosis) or in your lungs (pulmonary embolism) and to reduce the risk of them occurring again.  What do you need to know about Xarelto? The starting dose is one 15 mg tablet taken TWICE daily with food for the FIRST 21 DAYS then on 8/8  the dose is changed to one 20 mg tablet taken ONCE A DAY with your evening meal.  DO NOT stop taking Xarelto without talking to the health care provider who prescribed the medication.  Refill your prescription for 20 mg tablets before you run out.  After discharge, you should have regular  check-up appointments with your healthcare provider that is prescribing your Xarelto.  In the future your dose may need to be changed if your kidney function changes by a significant amount.  What do you do if you miss a dose? If you are taking Xarelto TWICE DAILY and you miss a dose, take it as soon as you remember. You may take two 15 mg tablets (total 30 mg) at the same time then resume your regularly scheduled 15 mg twice daily the next day.  If you are taking Xarelto ONCE DAILY and you miss a dose, take it as soon as you remember on the same day then continue your regularly scheduled once daily regimen the next day. Do not take two doses of Xarelto at the same time.   Important Safety Information Xarelto is a blood thinner medicine that can cause bleeding. You should call your healthcare provider right away if you experience any of the following: Bleeding from an injury or your nose that does not stop. Unusual colored urine (red or dark brown) or unusual colored stools (red or black). Unusual bruising for unknown reasons. A serious fall or if you hit your head (even if there is no bleeding).  Some medicines may interact with Xarelto and might increase your risk of bleeding while on Xarelto. To help avoid this, consult your healthcare provider or pharmacist prior to using any new prescription or non-prescription medications, including herbals, vitamins, non-steroidal anti-inflammatory drugs (NSAIDs) and supplements.  This website has more information on Xarelto: VisitDestination.com.br.

## 2021-11-28 NOTE — ED Triage Notes (Signed)
Pt had an ultra sound today and was found to have a dvt in the rt lower leg. Having some difficulty breathing and dizziness now

## 2021-11-28 NOTE — Progress Notes (Addendum)
Subjective:    Patient ID: Stephanie [name], female    DOB: 10/11/1960, 61 y.o.   MRN: [id number]  HPI  61 year old female patient with history of right hip replacement, varicose veins presents to clinic today with complaints of right leg pain, swelling, and numbness x1 day.  Patient states that her thigh and her knee are both swollen and she is unable to weight-bear on her right leg.  Patient denies any injury to her hip or to her knees.  Patient also states that she is felt mildly short of breath x1 day as well.  Patient denies any orthopnea, chest pain, difficulty breathing, redness to her right leg.  Review of Systems  Musculoskeletal:        Right leg swelling, pain, numbness  All other systems reviewed and are negative.      Objective:   Physical Exam Vitals reviewed.  Constitutional:      General: She is not in acute distress.    Appearance: Normal appearance. She is obese. She is not ill-appearing, toxic-appearing or diaphoretic.  HENT:     Head: Normocephalic and atraumatic.  Cardiovascular:     Rate and Rhythm: Normal rate and regular rhythm.     Pulses: Normal pulses.     Heart sounds: Normal heart sounds. No murmur heard. Pulmonary:     Effort: Pulmonary effort is normal. No respiratory distress.     Breath sounds: Normal breath sounds. No wheezing.  Musculoskeletal:     Comments: Right thigh and right knee noticeably swollen.  Swelling is nonpitting.  No redness, red streaks, weeping, injuries, lacerations noted to the area. Tenderness to right leg  No swelling noted to right ankle or calf.  Skin:    General: Skin is warm.     Capillary Refill: Capillary refill takes less than 2 seconds.  Neurological:     Mental Status: She is alert.     Comments: Grossly intact  Psychiatric:        Mood and Affect: Mood normal.        Behavior: Behavior normal.         Assessment & Plan:   1. Leg swelling -Possible sequelae from hip replacement or DVT -We will  get x-ray of both right knee and right hip to rule out fractures -We will get D-dimer and ultrasound to rule out blood clot -We will also evaluate with CBC, CMP, and BNP to investigate possible heart failure due to patient's complaint of shortness of breath - DG Knee Complete 4 Views Right, stat - DG Hip Unilat W OR W/O Pelvis Min 4 Views Right, stat - D-dimer, quantitative, stat - CBC with Differential, stat - Comprehensive metabolic panel, stat - US Venous Img Lower Unilateral Right (DVT), stat - Brain natriuretic peptide, stat -If symptoms worsen or you develop increasing shortness of breath go to the emergency -Otherwise return to clinic on Monday for follow-up with Dr. Lorin Picket  Addendum-this case was discussed with Teresa Coombs Anjuli Gemmill NP as well as the patient.  She has a DVT of the right leg.  She has also been complaining of intermittent difficulty breathing over the past several days and worse in the past day.  Given that she states her breathing currently is having troubles along with feeling dizzy rather than treating with Eliquis it was felt important to send her to the emergency department because of the nature of her symptoms for further evaluation including pulmonary embolism CT protocol.  We will be able to provide  close follow-up if discharged later tonight.  ER was called and I was transferred to triage and gave the above information.  Lilyan Punt MD  Note:  This document was prepared using Conservation officer, historic buildings and may include unintentional dictation errors. Note - This record has been created using [employer].  Chart creation errors have been sought, but may not always  have been located. Such creation errors do not reflect on  the standard of medical care.

## 2021-11-28 NOTE — Progress Notes (Signed)
ANTICOAGULATION CONSULT NOTE - Initial Consult  Pharmacy Consult for Xarelto Indication: pulmonary embolus and DVT  Allergies  Allergen Reactions   Amoxicillin Rash    fash swelled up and itching 04/2018   Aspirin Anaphylaxis   Other Anaphylaxis    Nsaids   Hydrocodone Itching        Latex Hives and Itching   Oxycodone     itching   Levaquin [Levofloxacin]     Rash on chest no hives 04/2018    Patient Measurements: Height: 5\' 4"  (162.6 cm) Weight: 93 kg (205 lb) IBW/kg (Calculated) : 54.7  Vital Signs: Temp: 99.3 F (37.4 C) (07/18 1651) Temp Source: Oral (07/18 1651) BP: 157/98 (07/18 2130) Pulse Rate: 77 (07/18 2245)  Labs: Recent Labs    11/28/21 1533 11/28/21 2029  HGB 14.8 15.0  HCT 44.7 45.7  PLT 288 278  CREATININE 0.56 0.52  TROPONINIHS  --  7    Estimated Creatinine Clearance: 82.6 mL/min (by C-G formula based on SCr of 0.52 mg/dL).   Medical History: Past Medical History:  Diagnosis Date   Allergic rhinitis    Allergy    Arthritis    Asthma    Depression    GERD (gastroesophageal reflux disease)    Heart murmur    Hypertension    Pneumonia    hx of x 3     Assessment: Stephanie [name] is a 61 year old female who was seen by her PCP today, where they found a DVT of her right leg. She was also having SOB, so they sent her to the ED. CT angio chest positive for a subsegmental PE. Pharmacy was consulted to start Xarelto. Patient is not on anticoagulation prior to admission. CBC wnl. SCr 0.52.   Goal of Therapy:  Therapeutic anticoagulation Monitor platelets by anticoagulation protocol: Yes   Plan:  Start Xarelto 15 mg PO BID with meals x 21 days, followed by  Xarelto 20 mg daily with supper  Doses must be taken with food for full absorption Monitor for bleeding   Thank you for allowing 67 to participate in this patients care. [place], PharmD 11/28/2021 11:05 PM  **Pharmacist phone directory can be found on [email or web address] listed  under [facility]**

## 2021-11-28 NOTE — ED Provider Notes (Signed)
[facility] Provider Note   CSN: [id number] Arrival date & time: 11/28/21  1635     History  Chief Complaint  Patient presents with   Shortness of Breath    Stephanie [name] is a 61 y.o. female.  HPI     Stephanie [name] is a 61 year old female with a medical history significant for asthma and hypertension who reports to the Emergency Department with a chief complaint of SOB and right leg pain. Patient states she noticed the right leg pain yesterday morning and that it got progressively worst. It is difficult for her to walk due to the pain and admits that she can not describe the pain but feels that it is deep and her knee is stiff. She was seen by her PCP today that found a DVT of her right leg and with the SOB they suggested she come into the ED for further evaluation. She states she started having the SOB on Sunday and it has gotten worst even with an increase of inhaler use of once a day to now three times a day. Patient states she noticed some chest pain that started today as well and describes it as a stinging/ tingling sensation on the left side. She admits to dizziness, palpitations, blurry vision, and headaches. She denies any nausea, vomiting, fever, or syncope.   Home Medications Prior to Admission medications   Medication Sig Start Date End Date Taking? Authorizing Provider  albuterol (PROVENTIL HFA) 108 (90 Base) MCG/ACT inhaler Inhale 2 puffs into the lungs every 4 (four) hours as needed for wheezing or shortness of breath. 06/20/21  Yes Kozlow, Donnamarie Poag, MD  albuterol (PROVENTIL) (2.5 MG/3ML) 0.083% nebulizer solution TAKE 28m BY NEBULIZER EVERY 4 HOURS AS NEEDED FOR WHEEZING OR SHORTNESS OF BREATH Patient taking differently: Take 2.5 mg by nebulization every 4 (four) hours as needed for shortness of breath or wheezing. 08/23/21  Yes Kozlow, EDonnamarie Poag MD  amLODipine (NORVASC) 2.5 MG tablet Take 1 tablet (2.5 mg total) by mouth daily. Takes 1 tablet 2.5 mg  daily. 07/10/21  Yes Luking, SElayne Snare MD  arformoterol (BROVANA) 15 MCG/2ML NEBU Take 2 mLs (15 mcg total) by nebulization 2 (two) times daily. 06/22/21  Yes Kozlow, EDonnamarie Poag MD  Ascorbic Acid (VITAMIN C) 1000 MG tablet Take 1,000 mg by mouth daily.   Yes [provider]  Cholecalciferol (VITAMIN D3) 50 MCG (2000 UT) TABS Take by mouth daily at 6 (six) AM.   Yes [provider]  EPINEPHrine (EPIPEN 2-PAK) 0.3 mg/0.3 mL IJ SOAJ injection Inject 0.3 mg into the muscle once as needed for anaphylaxis (allergic reaction). 06/20/21  Yes Kozlow, EDonnamarie Poag MD  GARLIC PO Take by mouth.   Yes [provider]  methocarbamol (ROBAXIN) 500 MG tablet Take by mouth every 6 (six) hours as needed for muscle spasms. 11/01/20  Yes [provider]  omeprazole (PRILOSEC) 40 MG capsule Take 1 capsule (40 mg total) by mouth daily. 06/20/21  Yes Kozlow, EDonnamarie Poag MD  potassium chloride SA (KLOR-CON M) 20 MEQ tablet TAKE 1 TABLET BY MOUTH EVERY DAY 11/13/21  Yes Luking, SElayne Snare MD  RIVAROXABAN (XARELTO) VTE STARTER PACK (15 & 20 MG) Follow package directions: Take one 132mtablet by mouth twice a day. On day 22, switch to one 2056mablet once a day. Take with food. 11/28/21  Yes NanVarney BilesD  triamcinolone (NASACORT) 55 MCG/ACT AERO nasal inhaler Place 1 spray into the nose daily.  06/20/21  Yes Kozlow, Donnamarie Poag, MD  triamcinolone cream (KENALOG) 0.1 % Apply 1 application topically 2 (two) times daily. Patient taking differently: Apply 1 application  topically as needed. 09/02/20  Yes Kathyrn Drown, MD  vitamin E 180 MG (400 UNITS) capsule Take 400 Units by mouth daily.   Yes [provider]  Zinc 50 MG CAPS Take by mouth daily at 6 (six) AM.   Yes [provider]  budesonide (PULMICORT) 0.5 MG/2ML nebulizer solution Take 2 mLs (0.5 mg total) by nebulization 2 (two) times daily as needed. Patient not taking: Reported on 11/28/2021 06/20/21   Jiles Prows, MD  indapamide (LOZOL) 1.25  MG tablet TAKE 1 TABLET BY MOUTH EVERY DAY 11/13/21   Kathyrn Drown, MD  Sod Picosulfate-Mag Ox-Cit Acd (CLENPIQ) 10-3.5-12 MG-GM -GM/160ML SOLN Take 1 kit by mouth as directed. Patient not taking: Reported on 11/28/2021 09/27/21   Cirigliano, Dominic Pea, DO  TRUE METRIX BLOOD GLUCOSE TEST test strip SMARTSIG:Via Meter 05/26/21   [provider]  TRUEplus Lancets 33G MISC  05/26/21   [provider]      Allergies    Amoxicillin, Aspirin, Other, Hydrocodone, Latex, Oxycodone, and Levaquin [levofloxacin]    Review of Systems   Review of Systems  All other systems reviewed and are negative.   Physical Exam Updated Vital Signs BP (!) 157/98   Pulse 79   Temp 99.3 F (37.4 C) (Oral)   Resp 18   Ht 5' 4"  (1.626 m)   Wt 93 kg   LMP 09/17/2016 (Approximate)   SpO2 100%   BMI 35.19 kg/m  Physical Exam Vitals and nursing note reviewed.  Constitutional:      Appearance: She is well-developed.  HENT:     Head: Atraumatic.  Cardiovascular:     Rate and Rhythm: Normal rate.  Pulmonary:     Effort: Pulmonary effort is normal.  Musculoskeletal:     Cervical back: Normal range of motion and neck supple.     Right lower leg: Tenderness present. Edema present.  Skin:    General: Skin is warm and dry.  Neurological:     Mental Status: She is alert and oriented to person, place, and time.     ED Results / Procedures / Treatments   Labs (all labs ordered are listed, but only abnormal results are displayed) Labs Reviewed  BASIC METABOLIC PANEL - Abnormal; Notable for the following components:      Result Value   Potassium 3.4 (*)    All other components within normal limits  CBC WITH DIFFERENTIAL/PLATELET  BRAIN NATRIURETIC PEPTIDE  TROPONIN I (HIGH SENSITIVITY)    EKG None  Radiology CT Angio Chest PE W and/or Wo Contrast  Result Date: 11/28/2021 CLINICAL DATA:  Pulmonary embolism suspected, high probability. Known DVT. Difficulty breathing and dizziness. EXAM:  CT ANGIOGRAPHY CHEST WITH CONTRAST TECHNIQUE: Multidetector CT imaging of the chest was performed using the standard protocol during bolus administration of intravenous contrast. Multiplanar CT image reconstructions and MIPs were obtained to evaluate the vascular anatomy. RADIATION DOSE REDUCTION: This exam was performed according to the departmental dose-optimization program which includes automated exposure control, adjustment of the mA and/or kV according to patient size and/or use of iterative reconstruction technique. CONTRAST:  121m OMNIPAQUE IOHEXOL 350 MG/ML SOLN COMPARISON:  09/07/2020. FINDINGS: Cardiovascular: The heart is enlarged and there is no pericardial effusion. The aorta and pulmonary trunk are normal in caliber. A small subsegmental pulmonary artery filling defect is  identified in the right upper lobe. Evaluation is limited due to mixing artifact and respiratory motion. No evidence of right heart strain. Mediastinum/Nodes: No enlarged mediastinal, hilar, or axillary lymph nodes. Thyroid gland, trachea, and esophagus demonstrate no significant findings. Lungs/Pleura: No consolidation, effusion, or pneumothorax. Upper Abdomen: No acute abnormality. Musculoskeletal: Degenerative changes are present in the thoracic spine. No acute or suspicious osseous abnormality. Review of the MIP images confirms the above findings. IMPRESSION: 1. Small filling defect in a right upper lobe subsegmental artery suggesting small pulmonary embolism. No evidence of right heart strain. 2. Cardiomegaly. Critical findings were reported to Dr. Kathrynn Humble at 10:03 p.m. Electronically Signed   By: Brett Fairy M.D.   On: 11/28/2021 22:04   US Venous Img Lower Unilateral Right (DVT)  Result Date: 11/28/2021 CLINICAL DATA:  Right leg swelling and pain EXAM: RIGHT  LOWER EXTREMITY VENOUS DOPPLER ULTRASOUND TECHNIQUE: Gray-scale sonography with compression, as well as color and duplex ultrasound, were performed to evaluate  the deep venous system(s) from the level of the common femoral vein through the popliteal and proximal calf veins. COMPARISON:  08/21/2016 FINDINGS: VENOUS Occlusive thrombus in the right peroneal vein. Normal compressibility of the common femoral, superficial femoral, and popliteal veins, as well as the remaining visualized calf veins. Visualized portions of profunda femoral vein and great saphenous vein unremarkable. No filling defects to suggest DVT on grayscale or color Doppler imaging. Doppler waveforms show normal direction of venous flow, normal respiratory plasticity and response to augmentation. Limited views of the contralateral common femoral vein are unremarkable. OTHER None. Limitations: none IMPRESSION: Occlusive thrombus in the right peroneal vein. These results will be called to the ordering clinician or representative by the Radiologist Assistant, and communication documented in the PACS or [employer]. Electronically Signed   By: Merilyn Baba M.D.   On: 11/28/2021 16:26   DG HIPS BILAT WITH PELVIS MIN 5 VIEWS  Result Date: 11/28/2021 CLINICAL DATA:  Post fall 2 months ago with persistent bilateral hip pain. EXAM: DG HIP (WITH OR WITHOUT PELVIS) 5+V BILAT COMPARISON:  None Available. FINDINGS: Post bilateral total hip replacements.  No fracture or dislocation. There is a very minimal amount of lucency surrounding the superior aspect of the Femoral stem component of the right total hip prosthesis. No definitive evidence hardware failure or loosening of the left total hip prosthesis. Enthesopathic change involving the bilateral anterior inferior iliac spines, right greater than left, presumably the sequela of remote trauma. Degenerative change of the lower lumbar spine is suspected though incompletely evaluated. Multiple phleboliths overlie the lower pelvis bilaterally. IMPRESSION: 1. Potential mild lucency surrounding the superior aspect of the femoral component of the right total hip  prosthesis is nonspecific though could be seen in the setting of early hardware loosening. Clinical correlation is advised. 2. Otherwise, no explanation for patient's right leg pain. 3. Post left total hip prosthesis without evidence of hardware failure or loosening. Electronically Signed   By: Sandi Mariscal M.D.   On: 11/28/2021 16:15   DG Knee Complete 4 Views Right  Result Date: 11/28/2021 CLINICAL DATA:  Patient fell 2 months ago with persistent radiating right lower extremity pain. EXAM: RIGHT KNEE - COMPLETE 4+ VIEW COMPARISON:  None Available. FINDINGS: No fracture or dislocation. Mild degenerative change of the knee, worse within the medial compartment and patellofemoral joint with joint space loss, subchondral sclerosis and osteophytosis. There is minimal spurring of the tibial spines. No evidence of chondrocalcinosis. No joint effusion. Regional soft tissues appear normal.  IMPRESSION: Mild degenerative change of the knee, worse within the medial compartment. Electronically Signed   By: Sandi Mariscal M.D.   On: 11/28/2021 16:11    Procedures .Critical Care  Performed by: Varney Biles, MD Authorized by: Varney Biles, MD   Critical care provider statement:    Critical care time (minutes):  35   Critical care was necessary to treat or prevent imminent or life-threatening deterioration of the following conditions:  Circulatory failure   Critical care was time spent personally by me on the following activities:  Development of treatment plan with patient or surrogate, discussions with consultants, evaluation of patient's response to treatment, examination of patient, ordering and review of laboratory studies, ordering and review of radiographic studies, ordering and performing treatments and interventions, pulse oximetry, re-evaluation of patient's condition and review of old charts     Medications Ordered in ED Medications  iohexol (OMNIPAQUE) 350 MG/ML injection 100 mL (100 mLs  Intravenous Contrast Given 11/28/21 2138)    ED Course/ Medical Decision Making/ A&P Clinical Course as of 11/28/21 2245  Tue Nov 28, 2021  2130 CT Angio Chest PE W and/or Wo Contrast CT angiogram interpreted independently.  No evidence of saddle PE. [AN]  2703 CT Angio Chest PE W and/or Wo Contrast Received a call from radiology.  Patient has a subsegmental PE.  On modified pesi -patient score is 0.  She is low risk, and stable for discharge from the ER.  Patient is in agreement with the plan.  O2 sats at 100%.  We will start her on Xarelto.  Advised PCP follow-up for additional medication. Return precautions discussed. [AN]    Clinical Course User Index [AN] Varney Biles, MD                           Medical Decision Making Amount and/or Complexity of Data Reviewed Labs: ordered. Radiology: ordered.  Risk Prescription drug management.   This patient presents to the ED with chief complaint(s) of shortness of breath, chest pain and diagnosis of DVT earlier today with pertinent past medical history of DVT that was diagnosed today which further complicates the presenting complaint. The complaint involves an extensive differential diagnosis and also carries with it a high risk of complications and morbidity.    The differential diagnosis includes PE, CHF, ACS, pleural effusion, pulmonary edema  The initial plan is to proceed with CT angiogram to evaluate for PE.  We will also get cardiac markers such as BNP and troponin   Additional history obtained: Additional history obtained from family Records reviewed previous admission documents and Primary Care Documents  Independent labs interpretation:  The following labs were independently interpreted: Normal troponin, normal BNP,  Treatment and Reassessment: Starting on xarelto.  Consideration for admission or further workup: Admission was considered for this patient, however patient has modified PESI score of 0, normal  biomarkers, insurance and follow-up.  She is reliable and comfortable with discharg.   Final Clinical Impression(s) / ED Diagnoses Final diagnoses:  Single subsegmental pulmonary embolism without acute cor pulmonale (HCC)  Acute deep vein thrombosis (DVT) of proximal vein of right lower extremity ([place])    Rx / DC Orders ED Discharge Orders          Ordered    RIVAROXABAN (XARELTO) VTE STARTER PACK (15 & 20 MG)        11/28/21 2242              Charmin Aguiniga,  Kamali Nephew, MD 11/28/21 2249

## 2021-11-29 ENCOUNTER — Telehealth: Payer: Self-pay | Admitting: Family Medicine

## 2021-11-29 NOTE — Telephone Encounter (Signed)
Patient has been informed per drs recommendations 

## 2021-11-29 NOTE — Telephone Encounter (Signed)
Front This patient was recently seen here and emergency department for DVT and pulmonary embolus.  I would like for her visit to be with me next week and one of my open slots it can be toward the end of next week would be fine.  There is no need for her to come on Friday to see Eber Jones.  This can be canceled.  Please talk with the patient and arrange this thank you

## 2021-11-29 NOTE — ED Notes (Signed)
Went over [employer]. All questions answered. Wheeled out to lobby

## 2021-11-30 ENCOUNTER — Encounter: Payer: Medicare HMO | Admitting: Gastroenterology

## 2021-12-01 ENCOUNTER — Ambulatory Visit: Payer: Medicare HMO | Admitting: Nurse Practitioner

## 2021-12-01 DIAGNOSIS — R0789 Other chest pain: Secondary | ICD-10-CM | POA: Diagnosis not present

## 2021-12-01 DIAGNOSIS — R42 Dizziness and giddiness: Secondary | ICD-10-CM | POA: Diagnosis not present

## 2021-12-01 DIAGNOSIS — Z7901 Long term (current) use of anticoagulants: Secondary | ICD-10-CM | POA: Diagnosis not present

## 2021-12-01 DIAGNOSIS — R079 Chest pain, unspecified: Secondary | ICD-10-CM | POA: Diagnosis not present

## 2021-12-01 DIAGNOSIS — R0602 Shortness of breath: Secondary | ICD-10-CM | POA: Diagnosis not present

## 2021-12-01 DIAGNOSIS — R600 Localized edema: Secondary | ICD-10-CM | POA: Diagnosis not present

## 2021-12-02 DIAGNOSIS — R0602 Shortness of breath: Secondary | ICD-10-CM | POA: Diagnosis not present

## 2021-12-02 DIAGNOSIS — R079 Chest pain, unspecified: Secondary | ICD-10-CM | POA: Diagnosis not present

## 2021-12-07 ENCOUNTER — Ambulatory Visit: Payer: Medicare HMO | Admitting: Family Medicine

## 2021-12-08 ENCOUNTER — Encounter: Payer: Self-pay | Admitting: Family Medicine

## 2021-12-08 ENCOUNTER — Ambulatory Visit (INDEPENDENT_AMBULATORY_CARE_PROVIDER_SITE_OTHER): Payer: Medicare HMO | Admitting: Family Medicine

## 2021-12-08 VITALS — BP 128/80 | HR 96 | Temp 97.2°F | Wt 200.0 lb

## 2021-12-08 DIAGNOSIS — O2230 Deep phlebothrombosis in pregnancy, unspecified trimester: Secondary | ICD-10-CM | POA: Diagnosis not present

## 2021-12-08 DIAGNOSIS — M79605 Pain in left leg: Secondary | ICD-10-CM | POA: Diagnosis not present

## 2021-12-08 DIAGNOSIS — I82401 Acute embolism and thrombosis of unspecified deep veins of right lower extremity: Secondary | ICD-10-CM | POA: Diagnosis not present

## 2021-12-08 DIAGNOSIS — M25552 Pain in left hip: Secondary | ICD-10-CM | POA: Diagnosis not present

## 2021-12-08 MED ORDER — FLUTICASONE PROPIONATE HFA 110 MCG/ACT IN AERO: INHALATION_SPRAY | RESPIRATORY_TRACT | 3 refills | Status: DC

## 2021-12-08 MED ORDER — RIVAROXABAN 20 MG PO TABS: 20.0000 mg | ORAL_TABLET | Freq: Every day | ORAL | 3 refills | Status: DC

## 2021-12-08 NOTE — Progress Notes (Unsigned)
   Subjective:    Patient ID: Stephanie [name], female    DOB: 1960/09/06, 61 y.o.   MRN: [id number]  HPI Pt following up from 11/28/21 visit with Teresa Coombs. Pt had blood clot in right leg. Pt states she has had some swelling and is still having pain. Left leg is having stiffness and swelling in hip. Pt currently not taking any oral pain meds but has been using topical analgesic.   Review of Systems     Objective:   Physical Exam Lungs are clear hearts regular right lower leg minimal swelling no tenderness  CT scan was reviewed from ER     Assessment & Plan:  DVT using her compression stockings is not taking Xarelto correctly this was corrected.  She will take twice daily through the next 3 weeks then take Xarelto once per day  Warning signs regarding bleeding was discussed  Staying away from all NSAIDs was discussed  ER notes from [facility] reviewed  Follow-up 3 months more than likely referral to hematology oncology at that time

## 2021-12-12 ENCOUNTER — Ambulatory Visit: Payer: Medicare HMO | Admitting: Family Medicine

## 2021-12-13 ENCOUNTER — Encounter: Payer: Medicare HMO | Admitting: Gastroenterology

## 2021-12-21 ENCOUNTER — Ambulatory Visit (INDEPENDENT_AMBULATORY_CARE_PROVIDER_SITE_OTHER): Payer: Medicare HMO | Admitting: Nurse Practitioner

## 2021-12-21 VITALS — BP 130/88 | HR 90 | Ht 64.0 in | Wt 203.2 lb

## 2021-12-21 DIAGNOSIS — I82451 Acute embolism and thrombosis of right peroneal vein: Secondary | ICD-10-CM

## 2021-12-21 DIAGNOSIS — M7989 Other specified soft tissue disorders: Secondary | ICD-10-CM | POA: Diagnosis not present

## 2021-12-21 DIAGNOSIS — R29898 Other symptoms and signs involving the musculoskeletal system: Secondary | ICD-10-CM

## 2021-12-21 NOTE — Progress Notes (Signed)
   Subjective:    Patient ID: Stephanie [name], female    DOB: 1960-05-31, 61 y.o.   MRN: [id number]  HPI  Patient recently treated for blood clot to the lungs into her right leg.  Patient currently on Xarelto 15 mg twice daily.  Patient presents to the clinic today with continued pain and swelling to her right leg in addition to new swelling to her left hip and left arm both x 3 to 4 days.  Patient also states that she notices a popping sound in her right knee that occurs when she ambulates at times.    Patient has history of bilateral hip replacements.  Review of Systems  Cardiovascular:  Positive for leg swelling.       Objective:   Physical Exam Vitals reviewed.  Constitutional:      General: She is not in acute distress.    Appearance: Normal appearance. She is normal weight. She is not ill-appearing, toxic-appearing or diaphoretic.  HENT:     Head: Normocephalic and atraumatic.  Cardiovascular:     Rate and Rhythm: Normal rate and regular rhythm.     Pulses: Normal pulses.     Heart sounds: Normal heart sounds. No murmur heard. Pulmonary:     Effort: Pulmonary effort is normal. No respiratory distress.     Breath sounds: Normal breath sounds. No wheezing.  Abdominal:     General: Abdomen is flat. Bowel sounds are normal.     Palpations: Abdomen is soft.  Musculoskeletal:     Comments: Grossly intact.  Patient ambulates with cane.  Nonpitting edema noted to right thigh and tender to touch in the groin area.  Left arm nonpitting edema noted.  No tenderness to left arm.  Left hip nonpitting edema.  No tenderness noted to left hip.  No redness to any of the areas.  Skin:    General: Skin is warm.     Capillary Refill: Capillary refill takes less than 2 seconds.  Neurological:     Mental Status: She is alert.     Comments: Grossly intact  Psychiatric:        Mood and Affect: Mood normal.        Behavior: Behavior normal.           Assessment & Plan:   1.   DVT -Continue course of Xarelto -Considered possibility of secondary clot however patient on recommended course of therapy at this time -Will refer patient directly to hematology oncology for investigation of multiple blood clots. - CMP14+EGFR - CBC with Differential - Ambulatory referral to Hematology / Oncology -Return to clinic or emergency room if you develop shortness of breath, chest pain, worsening swelling to extremities, or worsening pain, noticed frank blood in urine or stool, severe bruising. -Follow-up with primary care provider in approximately 3 months or sooner if needed  2. Popping of right knee joint -Likely musculoskeletal in nature -Through shared decision making decided to have patient follow-up with hematology first and then will further evaluate popping noises right knee. -Continue RICE for knee popping/pain.

## 2021-12-22 ENCOUNTER — Telehealth: Payer: Self-pay | Admitting: Oncology

## 2021-12-22 ENCOUNTER — Encounter: Payer: Self-pay | Admitting: Nurse Practitioner

## 2021-12-22 LAB — CBC WITH DIFFERENTIAL/PLATELET
Basophils Absolute: 0.1 10*3/uL (ref 0.0–0.2)
Basos: 1 %
EOS (ABSOLUTE): 0.4 10*3/uL (ref 0.0–0.4)
Eos: 5 %
Hematocrit: 42.2 % (ref 34.0–46.6)
Hemoglobin: 14.6 g/dL (ref 11.1–15.9)
Immature Grans (Abs): 0 10*3/uL (ref 0.0–0.1)
Immature Granulocytes: 0 %
Lymphocytes Absolute: 2.1 10*3/uL (ref 0.7–3.1)
Lymphs: 29 %
MCH: 29.9 pg (ref 26.6–33.0)
MCHC: 34.6 g/dL (ref 31.5–35.7)
MCV: 87 fL (ref 79–97)
Monocytes Absolute: 0.4 10*3/uL (ref 0.1–0.9)
Monocytes: 6 %
Neutrophils Absolute: 4.3 10*3/uL (ref 1.4–7.0)
Neutrophils: 59 %
Platelets: 285 10*3/uL (ref 150–450)
RBC: 4.88 x10E6/uL (ref 3.77–5.28)
RDW: 13.2 % (ref 11.7–15.4)
WBC: 7.4 10*3/uL (ref 3.4–10.8)

## 2021-12-22 LAB — CMP14+EGFR
ALT: 18 IU/L (ref 0–32)
AST: 17 IU/L (ref 0–40)
Albumin/Globulin Ratio: 1.7 (ref 1.2–2.2)
Albumin: 4.8 g/dL (ref 3.8–4.9)
Alkaline Phosphatase: 110 IU/L (ref 44–121)
BUN/Creatinine Ratio: 15 (ref 12–28)
BUN: 9 mg/dL (ref 8–27)
Bilirubin Total: 0.4 mg/dL (ref 0.0–1.2)
CO2: 23 mmol/L (ref 20–29)
Calcium: 9.8 mg/dL (ref 8.7–10.3)
Chloride: 102 mmol/L (ref 96–106)
Creatinine, Ser: 0.6 mg/dL (ref 0.57–1.00)
Globulin, Total: 2.9 g/dL (ref 1.5–4.5)
Glucose: 92 mg/dL (ref 70–99)
Potassium: 3.7 mmol/L (ref 3.5–5.2)
Sodium: 140 mmol/L (ref 134–144)
Total Protein: 7.7 g/dL (ref 6.0–8.5)
eGFR: 103 mL/min/{1.73_m2} (ref >59)

## 2021-12-22 NOTE — Telephone Encounter (Signed)
Scheduled appt per 8/10 referral. Pt is aware of appt date and time. Pt is aware to arrive 15 mins prior to appt time and to bring and updated insurance card. Pt is aware of appt location.   

## 2021-12-26 ENCOUNTER — Ambulatory Visit (INDEPENDENT_AMBULATORY_CARE_PROVIDER_SITE_OTHER): Payer: Medicare HMO | Admitting: Nurse Practitioner

## 2021-12-26 ENCOUNTER — Encounter: Payer: Self-pay | Admitting: Nurse Practitioner

## 2021-12-26 ENCOUNTER — Ambulatory Visit (HOSPITAL_COMMUNITY)
Admission: RE | Admit: 2021-12-26 | Discharge: 2021-12-26 | Disposition: A | Discharge status: Home / Self Care | Payer: Medicare HMO | Source: Ambulatory Visit | Attending: Nurse Practitioner | Admitting: Nurse Practitioner

## 2021-12-26 VITALS — BP 117/81 | HR 70 | Temp 99.1°F | Wt 199.6 lb

## 2021-12-26 DIAGNOSIS — B9789 Other viral agents as the cause of diseases classified elsewhere: Secondary | ICD-10-CM | POA: Insufficient documentation

## 2021-12-26 DIAGNOSIS — R0602 Shortness of breath: Secondary | ICD-10-CM

## 2021-12-26 DIAGNOSIS — R079 Chest pain, unspecified: Secondary | ICD-10-CM | POA: Diagnosis not present

## 2021-12-26 DIAGNOSIS — J988 Other specified respiratory disorders: Secondary | ICD-10-CM

## 2021-12-26 DIAGNOSIS — R059 Cough, unspecified: Secondary | ICD-10-CM | POA: Diagnosis not present

## 2021-12-26 NOTE — Progress Notes (Signed)
[facility] 12/26/21

## 2021-12-26 NOTE — Progress Notes (Unsigned)
   Subjective:    Patient ID: Stephanie [name], female    DOB: 1960/08/25, 60 y.o.   MRN: [id number]  HPI Pt arrives with chest congestion that began yesterday. Worsening through out the day. Pt states she has deep green mucus that is blood tinged. Having some shortness of breath.    Review of Systems     Objective:   Physical Exam        Assessment & Plan:

## 2021-12-27 ENCOUNTER — Encounter: Payer: Self-pay | Admitting: Nurse Practitioner

## 2022-01-02 ENCOUNTER — Telehealth: Payer: Self-pay | Admitting: Oncology

## 2022-01-02 NOTE — Telephone Encounter (Signed)
R/s pt's new hem appt per pt request. Pt is aware of new appt date/time.  

## 2022-01-03 ENCOUNTER — Telehealth: Payer: Self-pay

## 2022-01-03 MED ORDER — KETOCONAZOLE 2 % EX CREA: TOPICAL_CREAM | CUTANEOUS | 1 refills | Status: DC

## 2022-01-03 NOTE — Telephone Encounter (Signed)
Caller name:Kensi Duty   On DPR? :Yes  Call back number:[phone]  Provider they see: Luking   Reason for call:Pt is calling to see if Dr Lorin Picket will call something in for ring worm to [facility]. I told her she may need to make appt

## 2022-01-03 NOTE — Telephone Encounter (Signed)
Pt states she is having itching burning and round red area on left wrist. Noticed last night. Please advise. Thank you

## 2022-01-03 NOTE — Telephone Encounter (Signed)
Med sent to pharmacy and pt is aware 

## 2022-01-03 NOTE — Telephone Encounter (Signed)
Recommend trying ketoconazole cream apply twice daily, 30 g, 1 refills Typically utilizing this for 3 to 4 weeks should take care of the problem

## 2022-01-05 ENCOUNTER — Inpatient Hospital Stay: Payer: Medicare HMO | Admitting: Oncology

## 2022-01-18 NOTE — Progress Notes (Signed)
[facility] Initial Visit:  Patient Care Team: Kathyrn Drown, MD as PCP - General (Family Medicine)  CHIEF COMPLAINTS/PURPOSE OF CONSULTATION:  Oncology History   No history exists.    HISTORY OF PRESENTING ILLNESS: Stephanie [name] 61 y.o. female is here because of VTE  May 08 2018:  CT PA negative for PE September 07 2020:  CT PA negative for PE November 28 2021:  Presented to [facility] with right leg pain/swelling and SOB  CT PA Small filling defect in a right upper lobe subsegmental artery suggesting small pulmonary embolism. No evidence of right heart strain. Cardiomegaly. LE U/S showed occlusive thrombus in the right peroneal vein.  Patient was placed on Xarelto and discharged home from the ED.   December 01 2021:  Presented to [facility] with near syncope CT PA negative for PE.    January 19 2022:  [facility] Consult Remains on Xarelto without bleeding problems  Patient is G4 P3013 and delivered by C section without history of VTE.  She has also undergone sequential hip arthroplasties without VTE.   She states that she had a bad fall in April 2023 during the course of which she injured the right knee so severely that she was placed in a knee brace.  She wore the brace for about a month.  In June 2023 patient took a road trip to [facility] without stopping.  Activities have also been limited because she has degenerative spine disease.    No family history of VTE   Review of Systems  Constitutional:  Positive for fatigue. Negative for chills, fever and unexpected weight change.       Occasional drenching night sweats.  No hot flashes  HENT:   Negative for mouth sores, nosebleeds and trouble swallowing.   Eyes:  Positive for eye problems. Negative for icterus.       Occasional blurry vision.    Respiratory:  Positive for shortness of breath. Negative for cough and hemoptysis.        Episodes of chest tightness at rest.  Has discussed  this with PCP DOE on walking up and down her driveway (161 feet) attributes this to asthma  Cardiovascular:  Positive for chest pain, leg swelling and palpitations.  Gastrointestinal:  Positive for diarrhea. Negative for abdominal pain, blood in stool and constipation.  Genitourinary:  Negative for dysuria, frequency and hematuria.   Musculoskeletal:  Positive for arthralgias, back pain and gait problem.       Myalgias or right leg Arthralgias of right leg  Neurological:  Positive for dizziness, gait problem and numbness.  Hematological:  Negative for adenopathy. Bruises/bleeds easily.  Psychiatric/Behavioral:  Positive for sleep disturbance. Negative for suicidal ideas. The patient is nervous/anxious.        Doesn't feel good and has SOB which interferes with sleep    MEDICAL HISTORY: Past Medical History:  Diagnosis Date   Allergic rhinitis    Allergy    Arthritis    Asthma    Depression    GERD (gastroesophageal reflux disease)    Heart murmur    Hypertension    Pneumonia    hx of x 3     SURGICAL HISTORY: Past Surgical History:  Procedure Laterality Date   CESAREAN SECTION  1985, [place] HIP ARTHROPLASTY Right 03/27/2017   Procedure: RIGHT TOTAL HIP ARTHROPLASTY ANTERIOR APPROACH;  Surgeon: Gaynelle Arabian, MD;  Location: [facility];  Service: Orthopedics;  Laterality: Right;   TOTAL HIP ARTHROPLASTY Left 08/17/2020   Procedure: TOTAL HIP ARTHROPLASTY ANTERIOR APPROACH;  Surgeon: Gaynelle Arabian, MD;  Location: [facility];  Service: Orthopedics;  Laterality: Left;  169mn    SOCIAL HISTORY: Social History   Socioeconomic History   Marital status: Divorced    Spouse name: Not on file   Number of children: 3   Years of education: Not on file   Highest education level: Not on file  Occupational History   Not on file  Tobacco Use   Smoking status: Former    Packs/day: 0.25    Years: 18.00    Total pack years: 4.50    Types: Cigarettes    Start  date: 09/24/1994    Quit date: 03/06/2013    Years since quitting: 8.8   Smokeless tobacco: Never  Vaping Use   Vaping Use: Never used  Substance and Sexual Activity   Alcohol use: Yes    Alcohol/week: 2.0 - 3.0 standard drinks of alcohol    Types: 2 - 3 Glasses of wine per week    Comment: red wine   Drug use: No   Sexual activity: Not Currently  Other Topics Concern   Not on file  Social History Narrative   2 daughters and 1 son, visit frequently per pt.    Social Determinants of Health   Financial Resource Strain: High Risk (03/14/2021)   Overall Financial Resource Strain (CARDIA)    Difficulty of Paying Living Expenses: Hard  Food Insecurity: Food Insecurity Present (03/14/2021)   Hunger Vital Sign    Worried About Running Out of Food in the Last Year: Sometimes true    Ran Out of Food in the Last Year: Sometimes true  Transportation Needs: No Transportation Needs (03/14/2021)   PRAPARE - THydrologist(Medical): No    Lack of Transportation (Non-Medical): No  Physical Activity: Insufficiently Active (03/14/2021)   Exercise Vital Sign    Days of Exercise per Week: 3 days    Minutes of Exercise per Session: 10 min  Stress: Stress Concern Present (03/14/2021)   [place]   Feeling of Stress : Rather much  Social Connections: Moderately Integrated (03/14/2021)   Social Connection and Isolation Panel [NHANES]    Frequency of Communication with Friends and Family: More than three times a week    Frequency of Social Gatherings with Friends and Family: More than three times a week    Attends Religious Services: 1 to 4 times per year    Active Member of [employer] Organizations: No    Attends CMusic therapist 1 to 4 times per year    Marital Status: Divorced  IHuman resources officerViolence: Not At Risk (03/14/2021)   Humiliation, Afraid, Rape, and Kick questionnaire    Fear of  Current or Ex-Partner: No    Emotionally Abused: No    Physically Abused: No    Sexually Abused: No    FAMILY HISTORY Family History  Problem Relation Age of Onset   Hypertension Mother    Hypertension Father    Stomach cancer Niece    Breast cancer Niece    Colon cancer Neg Hx    Esophageal cancer Neg Hx    Rectal cancer Neg Hx     ALLERGIES:  is allergic to amoxicillin, aspirin, other, hydrocodone, latex, oxycodone, and levaquin [levofloxacin].  MEDICATIONS:  Current Outpatient Medications  Medication Sig  Dispense Refill   albuterol (PROVENTIL HFA) 108 (90 Base) MCG/ACT inhaler Inhale 2 puffs into the lungs every 4 (four) hours as needed for wheezing or shortness of breath. 18 g 5   albuterol (PROVENTIL) (2.5 MG/3ML) 0.083% nebulizer solution TAKE 4m BY NEBULIZER EVERY 4 HOURS AS NEEDED FOR WHEEZING OR SHORTNESS OF BREATH (Patient taking differently: Take 2.5 mg by nebulization every 4 (four) hours as needed for shortness of breath or wheezing.) 180 mL 1   amLODipine (NORVASC) 2.5 MG tablet Take 1 tablet (2.5 mg total) by mouth daily. Takes 1 tablet 2.5 mg daily. 90 tablet 1   arformoterol (BROVANA) 15 MCG/2ML NEBU Take 2 mLs (15 mcg total) by nebulization 2 (two) times daily. 200 mL 3   Ascorbic Acid (VITAMIN C) 1000 MG tablet Take 1,000 mg by mouth daily.     Cholecalciferol (VITAMIN D3) 50 MCG (2000 UT) TABS Take by mouth daily at 6 (six) AM.     EPINEPHrine (EPIPEN 2-PAK) 0.3 mg/0.3 mL IJ SOAJ injection Inject 0.3 mg into the muscle once as needed for anaphylaxis (allergic reaction). 2 each 1   fluticasone (FLOVENT HFA) 110 MCG/ACT inhaler 2 puff twice a day for asthma rinse after use 1 each 3   GARLIC PO Take by mouth.     ketoconazole (NIZORAL) 2 % cream Apply cream to affect area BID 30 g 1   methocarbamol (ROBAXIN) 500 MG tablet Take by mouth every 6 (six) hours as needed for muscle spasms.     omeprazole (PRILOSEC) 40 MG capsule Take 1 capsule (40 mg total) by mouth  daily. 30 capsule 5   rivaroxaban (XARELTO) 20 MG TABS tablet Take 1 tablet (20 mg total) by mouth daily with supper. 30 tablet 3   RIVAROXABAN (XARELTO) VTE STARTER PACK (15 & 20 MG) Follow package directions: Take one 131mtablet by mouth twice a day. On day 22, switch to one 202mablet once a day. Take with food. 51 each 0   Sod Picosulfate-Mag Ox-Cit Acd (CLENPIQ) 10-3.5-12 MG-GM -GM/160ML SOLN Take 1 kit by mouth as directed. 320 mL 0   triamcinolone (NASACORT) 55 MCG/ACT AERO nasal inhaler Place 1 spray into the nose daily. 16.9 g 5   triamcinolone cream (KENALOG) 0.1 % Apply 1 application topically 2 (two) times daily. (Patient taking differently: Apply 1 application  topically as needed.) 30 g 0   TRUE METRIX BLOOD GLUCOSE TEST test strip SMARTSIG:Via Meter     TRUEplus Lancets 33G MISC      vitamin E 180 MG (400 UNITS) capsule Take 400 Units by mouth daily.     Zinc 50 MG CAPS Take by mouth daily at 6 (six) AM.     Current Facility-Administered Medications  Medication Dose Route Frequency Provider Last Rate Last Admin   Benralizumab SOSY 30 mg  30 mg Subcutaneous Q28 days GalValentina ShaggyD   30 mg at 10/18/20 1542    PHYSICAL EXAMINATION:  ECOG PERFORMANCE STATUS: 0 - Asymptomatic   Vitals:   01/19/22 1034  BP: (!) 170/91  Pulse: 74  Resp: 15  Temp: 98.6 F (37 C)  SpO2: 98%    Filed Weights   01/19/22 1034  Weight: 198 lb 3.2 oz (89.9 kg)     Physical Exam Vitals and nursing note reviewed.  Constitutional:      General: She is not in acute distress.    Appearance: Normal appearance. She is obese. She is not ill-appearing, toxic-appearing or diaphoretic.  HENT:  Head: Normocephalic and atraumatic.     Right Ear: External ear normal.     Left Ear: External ear normal.     Nose: Nose normal. No congestion or rhinorrhea.  Eyes:     General: No scleral icterus.    Extraocular Movements: Extraocular movements intact.     Conjunctiva/sclera:  Conjunctivae normal.     Pupils: Pupils are equal, round, and reactive to light.  Cardiovascular:     Rate and Rhythm: Normal rate.     Heart sounds: No murmur heard.    No friction rub. No gallop.  Pulmonary:     Effort: Pulmonary effort is normal. No respiratory distress.     Breath sounds: Normal breath sounds. No stridor. No wheezing or rhonchi.  Abdominal:     General: Bowel sounds are normal. There is no distension.     Palpations: Abdomen is soft. There is no mass.     Tenderness: There is no abdominal tenderness. There is no guarding or rebound.     Hernia: No hernia is present.  Musculoskeletal:        General: No swelling, tenderness or deformity.     Cervical back: Normal range of motion and neck supple. No rigidity or tenderness.     Right lower leg: Edema present.     Left lower leg: Edema present.  Lymphadenopathy:     Head:     Right side of head: No submental, submandibular, tonsillar, preauricular, posterior auricular or occipital adenopathy.     Left side of head: No submental, submandibular, tonsillar, preauricular, posterior auricular or occipital adenopathy.     Cervical: No cervical adenopathy.     Right cervical: No superficial, deep or posterior cervical adenopathy.    Left cervical: No superficial, deep or posterior cervical adenopathy.     Upper Body:     Right upper body: No supraclavicular, axillary, pectoral or epitrochlear adenopathy.     Left upper body: No supraclavicular, axillary, pectoral or epitrochlear adenopathy.  Skin:    General: Skin is warm.     Coloration: Skin is not jaundiced or pale.     Findings: No erythema, lesion or rash.  Neurological:     General: No focal deficit present.     Mental Status: She is alert and oriented to person, place, and time. Mental status is at baseline.     Cranial Nerves: No cranial nerve deficit.  Psychiatric:        Mood and Affect: Mood normal.        Behavior: Behavior normal.        Thought Content:  Thought content normal.        Judgment: Judgment normal.      LABORATORY DATA: I have personally reviewed the data as listed:  Office Visit on 12/21/2021  Component Date Value Ref Range Status   Glucose 12/21/2021 92  70 - 99 mg/dL Final   BUN 12/21/2021 9  8 - 27 mg/dL Final   Creatinine, Ser 12/21/2021 0.60  0.57 - 1.00 mg/dL Final   eGFR 12/21/2021 103  >59 mL/min/1.73 Final   BUN/Creatinine Ratio 12/21/2021 15  12 - 28 Final   Sodium 12/21/2021 140  134 - 144 mmol/L Final   Potassium 12/21/2021 3.7  3.5 - 5.2 mmol/L Final   Chloride 12/21/2021 102  96 - 106 mmol/L Final   CO2 12/21/2021 23  20 - 29 mmol/L Final   Calcium 12/21/2021 9.8  8.7 - 10.3 mg/dL Final   Total Protein  12/21/2021 7.7  6.0 - 8.5 g/dL Final   Albumin 12/21/2021 4.8  3.8 - 4.9 g/dL Final   Globulin, Total 12/21/2021 2.9  1.5 - 4.5 g/dL Final   Albumin/Globulin Ratio 12/21/2021 1.7  1.2 - 2.2 Final   Bilirubin Total 12/21/2021 0.4  0.0 - 1.2 mg/dL Final   Alkaline Phosphatase 12/21/2021 110  44 - 121 IU/L Final   AST 12/21/2021 17  0 - 40 IU/L Final   ALT 12/21/2021 18  0 - 32 IU/L Final   WBC 12/21/2021 7.4  3.4 - 10.8 x10E3/uL Final   RBC 12/21/2021 4.88  3.77 - 5.28 x10E6/uL Final   Hemoglobin 12/21/2021 14.6  11.1 - 15.9 g/dL Final   Hematocrit 12/21/2021 42.2  34.0 - 46.6 % Final   MCV 12/21/2021 87  79 - 97 fL Final   MCH 12/21/2021 29.9  26.6 - 33.0 pg Final   MCHC 12/21/2021 34.6  31.5 - 35.7 g/dL Final   RDW 12/21/2021 13.2  11.7 - 15.4 % Final   Platelets 12/21/2021 285  150 - 450 x10E3/uL Final   Neutrophils 12/21/2021 59  Not Estab. % Final   Lymphs 12/21/2021 29  Not Estab. % Final   Monocytes 12/21/2021 6  Not Estab. % Final   Eos 12/21/2021 5  Not Estab. % Final   Basos 12/21/2021 1  Not Estab. % Final   Neutrophils Absolute 12/21/2021 4.3  1.4 - 7.0 x10E3/uL Final   Lymphocytes Absolute 12/21/2021 2.1  0.7 - 3.1 x10E3/uL Final   Monocytes Absolute 12/21/2021 0.4  0.1 - 0.9 x10E3/uL  Final   EOS (ABSOLUTE) 12/21/2021 0.4  0.0 - 0.4 x10E3/uL Final   Basophils Absolute 12/21/2021 0.1  0.0 - 0.2 x10E3/uL Final   Immature Granulocytes 12/21/2021 0  Not Estab. % Final   Immature Grans (Abs) 12/21/2021 0.0  0.0 - 0.1 x10E3/uL Final    RADIOGRAPHIC STUDIES: I have personally reviewed the radiological images as listed and agree with the findings in the report  No results found.  ASSESSMENT/PLAN  61 year old female who was diagnosed with distal RLE DVT and small PE in July 2023.  VTE diagnosed in July 2023:  Patient presented to ED with SOB as well as pain and swelling in distal RLE.  CT PA showed small filling defect in a right upper lobe subsegmental artery suggesting small pulmonary embolism. LE U/S showed occlusive thrombus in the right peroneal vein.  Patient was placed on Xarelto in the ED and appropriately discharged home.    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  These have been in the form of surgery and childbirth.  The time frame of RLE injury makes it unlikely to be a major contributing factor.  The road trip to [facility] is also not likely a major factory.  Her main risk factor is likely obesity.  It is reasonable to classify this this as an unprovoked event.  Asthma is not considered a risk factor for VTE  Duration of anticoagulation:  I have ordered D dimer level to help assess suitability for discontinuing anticoagulation. Will use the HERDOO2 Rule for Discontinuing Anticoagulation in Unprovoked VTE    Cancer Staging  No matching staging information was found for the patient.   No problem-specific Assessment & Plan notes found for this encounter.   Orders Placed This Encounter  Procedures   CBC with Differential ([facility] Only)  Standing Status:   Future    Number of Occurrences:   1    Standing Expiration Date:   01/20/2023   CMP ([facility]  only)    Standing Status:   Future    Number of Occurrences:   1    Standing Expiration Date:   01/20/2023   D-dimer, quantitative    Standing Status:   Future    Number of Occurrences:   1    Standing Expiration Date:   01/20/2023   CBC with Differential ([facility] Center Only)    Standing Status:   Future    Standing Expiration Date:   01/20/2023   CMP ([facility] only)    Standing Status:   Future    Standing Expiration Date:   01/20/2023   D-dimer, quantitative    Standing Status:   Future    Standing Expiration Date:   01/20/2023    All questions were answered. The patient knows to call the clinic with any problems, questions or concerns.  This note was electronically signed.    Barbee Cough, MD  01/19/2022 12:25 PM

## 2022-01-19 ENCOUNTER — Other Ambulatory Visit: Payer: Self-pay

## 2022-01-19 ENCOUNTER — Inpatient Hospital Stay: Payer: Medicare HMO

## 2022-01-19 ENCOUNTER — Inpatient Hospital Stay: Payer: Medicare HMO | Attending: Oncology | Admitting: Oncology

## 2022-01-19 DIAGNOSIS — J45909 Unspecified asthma, uncomplicated: Secondary | ICD-10-CM | POA: Insufficient documentation

## 2022-01-19 DIAGNOSIS — Z5181 Encounter for therapeutic drug level monitoring: Secondary | ICD-10-CM

## 2022-01-19 DIAGNOSIS — Z86711 Personal history of pulmonary embolism: Secondary | ICD-10-CM | POA: Insufficient documentation

## 2022-01-19 DIAGNOSIS — Z7901 Long term (current) use of anticoagulants: Secondary | ICD-10-CM | POA: Diagnosis not present

## 2022-01-19 DIAGNOSIS — I82411 Acute embolism and thrombosis of right femoral vein: Secondary | ICD-10-CM

## 2022-01-19 DIAGNOSIS — I82419 Acute embolism and thrombosis of unspecified femoral vein: Secondary | ICD-10-CM | POA: Insufficient documentation

## 2022-01-19 DIAGNOSIS — Z86718 Personal history of other venous thrombosis and embolism: Secondary | ICD-10-CM | POA: Insufficient documentation

## 2022-01-19 LAB — CMP (CANCER CENTER ONLY)
ALT: 16 U/L (ref 0–44)
AST: 16 U/L (ref 15–41)
Albumin: 4.3 g/dL (ref 3.5–5.0)
Alkaline Phosphatase: 83 U/L (ref 38–126)
Anion gap: 5 (ref 5–15)
BUN: 9 mg/dL (ref 6–20)
CO2: 28 mmol/L (ref 22–32)
Calcium: 9.4 mg/dL (ref 8.9–10.3)
Chloride: 106 mmol/L (ref 98–111)
Creatinine: 0.56 mg/dL (ref 0.44–1.00)
GFR, Estimated: >60 mL/min (ref >60)
Glucose, Bld: 91 mg/dL (ref 70–99)
Potassium: 3.7 mmol/L (ref 3.5–5.1)
Sodium: 139 mmol/L (ref 135–145)
Total Bilirubin: 0.8 mg/dL (ref 0.3–1.2)
Total Protein: 7.6 g/dL (ref 6.5–8.1)

## 2022-01-19 LAB — CBC WITH DIFFERENTIAL (CANCER CENTER ONLY)
Abs Immature Granulocytes: 0.01 10*3/uL (ref 0.00–0.07)
Basophils Absolute: 0.1 10*3/uL (ref 0.0–0.1)
Basophils Relative: 1 %
Eosinophils Absolute: 0.3 10*3/uL (ref 0.0–0.5)
Eosinophils Relative: 6 %
HCT: 42.2 % (ref 36.0–46.0)
Hemoglobin: 14.2 g/dL (ref 12.0–15.0)
Immature Granulocytes: 0 %
Lymphocytes Relative: 39 %
Lymphs Abs: 2.1 10*3/uL (ref 0.7–4.0)
MCH: 29.9 pg (ref 26.0–34.0)
MCHC: 33.6 g/dL (ref 30.0–36.0)
MCV: 88.8 fL (ref 80.0–100.0)
Monocytes Absolute: 0.3 10*3/uL (ref 0.1–1.0)
Monocytes Relative: 6 %
Neutro Abs: 2.6 10*3/uL (ref 1.7–7.7)
Neutrophils Relative %: 48 %
Platelet Count: 274 10*3/uL (ref 150–400)
RBC: 4.75 MIL/uL (ref 3.87–5.11)
RDW: 14.2 % (ref 11.5–15.5)
WBC Count: 5.5 10*3/uL (ref 4.0–10.5)
nRBC: 0 % (ref 0.0–0.2)

## 2022-01-19 LAB — D-DIMER, QUANTITATIVE: D-Dimer, Quant: 1.43 ug/mL-FEU — ABNORMAL HIGH (ref 0.00–0.50)

## 2022-01-22 ENCOUNTER — Telehealth: Payer: Self-pay | Admitting: Oncology

## 2022-01-22 NOTE — Telephone Encounter (Signed)
Scheduled appt per 9/8 los. Called pt, no answer. Left msg with appt date/time.

## 2022-02-06 DIAGNOSIS — Z0100 Encounter for examination of eyes and vision without abnormal findings: Secondary | ICD-10-CM | POA: Diagnosis not present

## 2022-02-06 DIAGNOSIS — H35033 Hypertensive retinopathy, bilateral: Secondary | ICD-10-CM | POA: Diagnosis not present

## 2022-02-06 DIAGNOSIS — H524 Presbyopia: Secondary | ICD-10-CM | POA: Diagnosis not present

## 2022-02-08 DIAGNOSIS — Z96641 Presence of right artificial hip joint: Secondary | ICD-10-CM | POA: Diagnosis not present

## 2022-02-08 DIAGNOSIS — M1611 Unilateral primary osteoarthritis, right hip: Secondary | ICD-10-CM | POA: Diagnosis not present

## 2022-03-09 ENCOUNTER — Ambulatory Visit (INDEPENDENT_AMBULATORY_CARE_PROVIDER_SITE_OTHER): Payer: Medicare HMO | Admitting: Family Medicine

## 2022-03-09 ENCOUNTER — Encounter: Payer: Self-pay | Admitting: Family Medicine

## 2022-03-09 VITALS — BP 118/72 | Wt 195.0 lb

## 2022-03-09 DIAGNOSIS — F439 Reaction to severe stress, unspecified: Secondary | ICD-10-CM

## 2022-03-09 DIAGNOSIS — M5431 Sciatica, right side: Secondary | ICD-10-CM

## 2022-03-09 DIAGNOSIS — I10 Essential (primary) hypertension: Secondary | ICD-10-CM | POA: Diagnosis not present

## 2022-03-09 DIAGNOSIS — Z23 Encounter for immunization: Secondary | ICD-10-CM

## 2022-03-09 MED ORDER — SERTRALINE HCL 50 MG PO TABS: 50.0000 mg | ORAL_TABLET | Freq: Every day | ORAL | 3 refills | Status: DC

## 2022-03-09 NOTE — Progress Notes (Signed)
   Subjective:    Patient ID: Stephanie [name], female    DOB: 1960-08-09, 61 y.o.   MRN: [id number]  HPI Pt arrives for follow up on HTN. Pt states that her health is acceptable and she is happy with it. Pt has not checked blood pressure. Has noticed some heart fluttering.  Nuys any chest pressure tightness or shortness of breath   Pt having back and hip pain; Dr.Aluisio states he believes the pain is coming from back. Pt states she doesn't want any medications for pain at this time.   Still having some swelling in leg from history of blood clot.    Review of Systems     Objective:   Physical Exam General-in no acute distress Eyes-no discharge Lungs-respiratory rate normal, CTA CV-no murmurs,RRR Extremities skin warm dry no edema Neuro grossly normal Behavior normal, alert Subjective discomfort right back with sciatica down the leg       Assessment & Plan:  1. Essential hypertension Blood pressure decent control continue current measures  2. Stress Add sertraline 50 mg follow-up in a few weeks recommend counseling - Ambulatory referral to Psychiatry  3. Right sided sciatica Stretching exercises if not improving physical therapy  4. Need for vaccination Flu shot today - Flu Vaccine QUAD 6+ mos PF IM (Fluarix Quad PF)  Follow up 3 to 4 weeks to see how her stress levels are doing  Over time hopefully she will see improvement with her joint discomfort in her back and legs if not improving consider MRI consider physical therapy

## 2022-03-09 NOTE — Patient Instructions (Signed)
Hi Sahily  Sorry you are having such difficulties  Sertraline is a medication that is used for depression and for stress Over time it can gradually build your serotonin levels which will help your moods  The first week half a tablet per day for the first week Then after that 1 tablet daily It is not unusual to have some slight nausea with this medicine After the first week and nausea which should settle down If you having any ongoing troubles please let us know I would like for you to follow-up in 3 to 4 weeks  TakeCare-Dr. Nicki Reaper

## 2022-03-29 ENCOUNTER — Ambulatory Visit (INDEPENDENT_AMBULATORY_CARE_PROVIDER_SITE_OTHER): Payer: Medicare HMO | Admitting: Family Medicine

## 2022-03-29 VITALS — BP 124/85 | HR 90 | Temp 98.9°F | Wt 193.0 lb

## 2022-03-29 DIAGNOSIS — R6889 Other general symptoms and signs: Secondary | ICD-10-CM | POA: Diagnosis not present

## 2022-03-29 NOTE — Progress Notes (Signed)
   Subjective:    Patient ID: Stephanie [name], female    DOB: 08-09-1960, 61 y.o.   MRN: [id number]  HPI  Cough chest cong, sore throat, headache, runny nose , diarrhea since Tuesday, headache, chills, body aches Patient denies wheezing difficulty breathing relates a lot of fatigue tiredness muscle aches Review of Systems     Objective:   Physical Exam Gen-NAD not toxic TMS-normal bilateral T- normal no redness Chest-CTA respiratory rate normal no crackles CV RRR no murmur Skin-warm dry Neuro-grossly normal       Assessment & Plan:  Viral syndrome Possible COVID Flulike illness Triple swab taken Warning signs discussed Await the results of test

## 2022-03-31 ENCOUNTER — Other Ambulatory Visit: Payer: Self-pay | Admitting: Family Medicine

## 2022-03-31 LAB — SPECIMEN STATUS REPORT

## 2022-03-31 LAB — COVID-19, FLU A+B AND RSV
Influenza A, NAA: NOT DETECTED
Influenza B, NAA: NOT DETECTED
RSV, NAA: NOT DETECTED
SARS-CoV-2, NAA: DETECTED — AB

## 2022-03-31 MED ORDER — MOLNUPIRAVIR EUA 200MG CAPSULE: 4.0000 | ORAL_CAPSULE | Freq: Two times a day (BID) | ORAL | 0 refills | Status: AC

## 2022-04-03 ENCOUNTER — Ambulatory Visit: Payer: Medicare HMO | Admitting: Family Medicine

## 2022-04-18 ENCOUNTER — Other Ambulatory Visit: Payer: Self-pay | Admitting: Family Medicine

## 2022-04-20 ENCOUNTER — Inpatient Hospital Stay: Payer: Medicare HMO | Attending: Oncology | Admitting: Oncology

## 2022-04-20 ENCOUNTER — Inpatient Hospital Stay: Payer: Medicare HMO

## 2022-04-20 ENCOUNTER — Encounter: Payer: Self-pay | Admitting: Oncology

## 2022-04-20 ENCOUNTER — Other Ambulatory Visit: Payer: Self-pay

## 2022-04-20 VITALS — BP 128/80 | HR 84 | Temp 97.7°F | Resp 15 | Wt 193.0 lb

## 2022-04-20 DIAGNOSIS — I82411 Acute embolism and thrombosis of right femoral vein: Secondary | ICD-10-CM

## 2022-04-20 DIAGNOSIS — Z7901 Long term (current) use of anticoagulants: Secondary | ICD-10-CM | POA: Diagnosis not present

## 2022-04-20 DIAGNOSIS — J45909 Unspecified asthma, uncomplicated: Secondary | ICD-10-CM | POA: Diagnosis not present

## 2022-04-20 DIAGNOSIS — Z86718 Personal history of other venous thrombosis and embolism: Secondary | ICD-10-CM | POA: Insufficient documentation

## 2022-04-20 DIAGNOSIS — U071 COVID-19: Secondary | ICD-10-CM

## 2022-04-20 DIAGNOSIS — Z5181 Encounter for therapeutic drug level monitoring: Secondary | ICD-10-CM | POA: Diagnosis not present

## 2022-04-20 DIAGNOSIS — Z86711 Personal history of pulmonary embolism: Secondary | ICD-10-CM | POA: Diagnosis not present

## 2022-04-20 DIAGNOSIS — E669 Obesity, unspecified: Secondary | ICD-10-CM | POA: Insufficient documentation

## 2022-04-20 LAB — CBC WITH DIFFERENTIAL (CANCER CENTER ONLY)
Abs Immature Granulocytes: 0.01 10*3/uL (ref 0.00–0.07)
Basophils Absolute: 0 10*3/uL (ref 0.0–0.1)
Basophils Relative: 1 %
Eosinophils Absolute: 0.2 10*3/uL (ref 0.0–0.5)
Eosinophils Relative: 3 %
HCT: 41 % (ref 36.0–46.0)
Hemoglobin: 13.8 g/dL (ref 12.0–15.0)
Immature Granulocytes: 0 %
Lymphocytes Relative: 31 %
Lymphs Abs: 1.6 10*3/uL (ref 0.7–4.0)
MCH: 29.4 pg (ref 26.0–34.0)
MCHC: 33.7 g/dL (ref 30.0–36.0)
MCV: 87.2 fL (ref 80.0–100.0)
Monocytes Absolute: 0.4 10*3/uL (ref 0.1–1.0)
Monocytes Relative: 8 %
Neutro Abs: 3 10*3/uL (ref 1.7–7.7)
Neutrophils Relative %: 57 %
Platelet Count: 263 10*3/uL (ref 150–400)
RBC: 4.7 MIL/uL (ref 3.87–5.11)
RDW: 15 % (ref 11.5–15.5)
WBC Count: 5.2 10*3/uL (ref 4.0–10.5)
nRBC: 0 % (ref 0.0–0.2)

## 2022-04-20 LAB — CMP (CANCER CENTER ONLY)
ALT: 13 U/L (ref 0–44)
AST: 14 U/L — ABNORMAL LOW (ref 15–41)
Albumin: 4.1 g/dL (ref 3.5–5.0)
Alkaline Phosphatase: 95 U/L (ref 38–126)
Anion gap: 5 (ref 5–15)
BUN: 9 mg/dL (ref 6–20)
CO2: 30 mmol/L (ref 22–32)
Calcium: 9.7 mg/dL (ref 8.9–10.3)
Chloride: 106 mmol/L (ref 98–111)
Creatinine: 0.57 mg/dL (ref 0.44–1.00)
GFR, Estimated: >60 mL/min (ref >60)
Glucose, Bld: 94 mg/dL (ref 70–99)
Potassium: 3.8 mmol/L (ref 3.5–5.1)
Sodium: 141 mmol/L (ref 135–145)
Total Bilirubin: 1 mg/dL (ref 0.3–1.2)
Total Protein: 7.3 g/dL (ref 6.5–8.1)

## 2022-04-20 LAB — D-DIMER, QUANTITATIVE: D-Dimer, Quant: 1.24 ug/mL-FEU — ABNORMAL HIGH (ref 0.00–0.50)

## 2022-04-20 NOTE — Progress Notes (Unsigned)
[facility] Initial Visit:  Patient Care Team: Kathyrn Drown, MD as PCP - General (Family Medicine)  CHIEF COMPLAINTS/PURPOSE OF CONSULTATION:  Oncology History   No history exists.    HISTORY OF PRESENTING ILLNESS: Stephanie [name] 61 y.o. female is here because of VTE  May 08 2018:  CT PA negative for PE September 07 2020:  CT PA negative for PE November 28 2021:  Presented to [facility] with right leg pain/swelling and SOB  CT PA Small filling defect in a right upper lobe subsegmental artery suggesting small pulmonary embolism. No evidence of right heart strain. Cardiomegaly. LE U/S showed occlusive thrombus in the right peroneal vein.  Patient was placed on Xarelto and discharged home from the ED.   December 01 2021:  Presented to [facility] with near syncope CT PA negative for PE.    January 19 2022:  [facility] Consult Remains on Xarelto without bleeding problems  Patient is G4 P3013 and delivered by C section without history of VTE.  She has also undergone sequential hip arthroplasties without VTE.   She states that she had a bad fall in April 2023 during the course of which she injured the right knee so severely that she was placed in a knee brace.  She wore the brace for about a month.  In June 2023 patient took a road trip to [facility] without stopping.  Activities have also been limited because she has degenerative spine disease.    No family history of VTE  WBC 5.5 hemoglobin 14.2 platelet count 274; 48 segs 39 lymphs 6 monos 6 eos 1 basophil D dimer 1.43 CMP normal and notable for Cr 0.56  April 20 2022:  Scheduled follow up for management of VTE.  Reviewed results of labs with patient.  Remains on Xarelto 20 mg daily.  November 12th had COVID; had sore throat, headache, diarrhea, weakness, SOB.  Symptoms are improving.  No bleeding issues.  HERDOO2 score 2 .  7.4% risk of major VTE per 100 patient years.  Not low.  Reviewed  result with patient.   Will check labs today and continue Xarelto at full dose because of elevated d dimer and recent COVID infection  Review of Systems  Constitutional:  Positive for fatigue. Negative for chills, fever and unexpected weight change.       Occasional drenching night sweats.  No hot flashes  HENT:   Negative for mouth sores, nosebleeds and trouble swallowing.   Eyes:  Positive for eye problems. Negative for icterus.       Occasional blurry vision.    Respiratory:  Positive for shortness of breath. Negative for cough and hemoptysis.        Episodes of chest tightness at rest.  Has discussed this with PCP DOE on walking up and down her driveway (627 feet) attributes this to asthma  Cardiovascular:  Positive for chest pain, leg swelling and palpitations.  Gastrointestinal:  Positive for diarrhea. Negative for abdominal pain, blood in stool and constipation.  Genitourinary:  Negative for dysuria, frequency and hematuria.   Musculoskeletal:  Positive for arthralgias, back pain and gait problem.       Myalgias or right leg Arthralgias of right leg  Neurological:  Positive for dizziness, gait problem and numbness.  Hematological:  Negative for adenopathy. Bruises/bleeds easily.  Psychiatric/Behavioral:  Positive for sleep disturbance. Negative for suicidal ideas. The patient is nervous/anxious.        Doesn't feel  good and has SOB which interferes with sleep    MEDICAL HISTORY: Past Medical History:  Diagnosis Date   Allergic rhinitis    Allergy    Arthritis    Asthma    Depression    GERD (gastroesophageal reflux disease)    Heart murmur    Hypertension    Pneumonia    hx of x 3     SURGICAL HISTORY: Past Surgical History:  Procedure Laterality Date   CESAREAN SECTION  1985, [place] HIP ARTHROPLASTY Right 03/27/2017   Procedure: RIGHT TOTAL HIP ARTHROPLASTY ANTERIOR APPROACH;  Surgeon: Gaynelle Arabian, MD;  Location: [facility];  Service:  Orthopedics;  Laterality: Right;   TOTAL HIP ARTHROPLASTY Left 08/17/2020   Procedure: TOTAL HIP ARTHROPLASTY ANTERIOR APPROACH;  Surgeon: Gaynelle Arabian, MD;  Location: [facility];  Service: Orthopedics;  Laterality: Left;  167mn    SOCIAL HISTORY: Social History   Socioeconomic History   Marital status: Divorced    Spouse name: Not on file   Number of children: 3   Years of education: Not on file   Highest education level: Not on file  Occupational History   Not on file  Tobacco Use   Smoking status: Former    Packs/day: 0.25    Years: 18.00    Total pack years: 4.50    Types: Cigarettes    Start date: 09/24/1994    Quit date: 03/06/2013    Years since quitting: 9.1   Smokeless tobacco: Never  Vaping Use   Vaping Use: Never used  Substance and Sexual Activity   Alcohol use: Yes    Alcohol/week: 2.0 - 3.0 standard drinks of alcohol    Types: 2 - 3 Glasses of wine per week    Comment: red wine   Drug use: No   Sexual activity: Not Currently  Other Topics Concern   Not on file  Social History Narrative   2 daughters and 1 son, visit frequently per pt.    Social Determinants of Health   Financial Resource Strain: High Risk (03/14/2021)   Overall Financial Resource Strain (CARDIA)    Difficulty of Paying Living Expenses: Hard  Food Insecurity: Food Insecurity Present (03/14/2021)   Hunger Vital Sign    Worried About Running Out of Food in the Last Year: Sometimes true    Ran Out of Food in the Last Year: Sometimes true  Transportation Needs: No Transportation Needs (03/14/2021)   PRAPARE - THydrologist(Medical): No    Lack of Transportation (Non-Medical): No  Physical Activity: Insufficiently Active (03/14/2021)   Exercise Vital Sign    Days of Exercise per Week: 3 days    Minutes of Exercise per Session: 10 min  Stress: Stress Concern Present (03/14/2021)   [place]    Feeling of Stress : Rather much  Social Connections: Moderately Integrated (03/14/2021)   Social Connection and Isolation Panel [NHANES]    Frequency of Communication with Friends and Family: More than three times a week    Frequency of Social Gatherings with Friends and Family: More than three times a week    Attends Religious Services: 1 to 4 times per year    Active Member of [employer] Organizations: No    Attends CMusic therapist 1 to 4 times per year    Marital Status: Divorced  IHuman resources officerViolence: Not At Risk (  03/14/2021)   Humiliation, Afraid, Rape, and Kick questionnaire    Fear of Current or Ex-Partner: No    Emotionally Abused: No    Physically Abused: No    Sexually Abused: No    FAMILY HISTORY Family History  Problem Relation Age of Onset   Hypertension Mother    Hypertension Father    Stomach cancer Niece    Breast cancer Niece    Colon cancer Neg Hx    Esophageal cancer Neg Hx    Rectal cancer Neg Hx     ALLERGIES:  is allergic to amoxicillin, aspirin, other, hydrocodone, latex, oxycodone, and levaquin [levofloxacin].  MEDICATIONS:  Current Outpatient Medications  Medication Sig Dispense Refill   albuterol (PROVENTIL HFA) 108 (90 Base) MCG/ACT inhaler Inhale 2 puffs into the lungs every 4 (four) hours as needed for wheezing or shortness of breath. 18 g 5   albuterol (PROVENTIL) (2.5 MG/3ML) 0.083% nebulizer solution TAKE 13m BY NEBULIZER EVERY 4 HOURS AS NEEDED FOR WHEEZING OR SHORTNESS OF BREATH (Patient taking differently: Take 2.5 mg by nebulization every 4 (four) hours as needed for shortness of breath or wheezing.) 180 mL 1   amLODipine (NORVASC) 2.5 MG tablet Take 1 tablet (2.5 mg total) by mouth daily. Takes 1 tablet 2.5 mg daily. 90 tablet 1   arformoterol (BROVANA) 15 MCG/2ML NEBU Take 2 mLs (15 mcg total) by nebulization 2 (two) times daily. 200 mL 3   Ascorbic Acid (VITAMIN C) 1000 MG tablet Take 1,000 mg by mouth daily.      Cholecalciferol (VITAMIN D3) 50 MCG (2000 UT) TABS Take by mouth daily at 6 (six) AM.     EPINEPHrine (EPIPEN 2-PAK) 0.3 mg/0.3 mL IJ SOAJ injection Inject 0.3 mg into the muscle once as needed for anaphylaxis (allergic reaction). 2 each 1   fluticasone (FLOVENT HFA) 110 MCG/ACT inhaler 2 puff twice a day for asthma rinse after use 1 each 3   GARLIC PO Take by mouth.     ketoconazole (NIZORAL) 2 % cream Apply cream to affect area BID 30 g 1   methocarbamol (ROBAXIN) 500 MG tablet Take by mouth every 6 (six) hours as needed for muscle spasms.     omeprazole (PRILOSEC) 40 MG capsule Take 1 capsule (40 mg total) by mouth daily. 30 capsule 5   RIVAROXABAN (XARELTO) VTE STARTER PACK (15 & 20 MG) Follow package directions: Take one 128mtablet by mouth twice a day. On day 22, switch to one 2054mablet once a day. Take with food. 51 each 0   sertraline (ZOLOFT) 50 MG tablet Take 1 tablet (50 mg total) by mouth daily. 30 tablet 3   Sod Picosulfate-Mag Ox-Cit Acd (CLENPIQ) 10-3.5-12 MG-GM -GM/160ML SOLN Take 1 kit by mouth as directed. 320 mL 0   triamcinolone (NASACORT) 55 MCG/ACT AERO nasal inhaler Place 1 spray into the nose daily. 16.9 g 5   triamcinolone cream (KENALOG) 0.1 % Apply 1 application topically 2 (two) times daily. (Patient taking differently: Apply 1 application  topically as needed.) 30 g 0   TRUE METRIX BLOOD GLUCOSE TEST test strip SMARTSIG:Via Meter     TRUEplus Lancets 33G MISC      vitamin E 180 MG (400 UNITS) capsule Take 400 Units by mouth daily.     XARELTO 20 MG TABS tablet TAKE 1 TABLET BY MOUTH EVERY DAY WITH SUPPER 30 tablet 3   Zinc 50 MG CAPS Take by mouth daily at 6 (six) AM.     Current  Facility-Administered Medications  Medication Dose Route Frequency Provider Last Rate Last Admin   Benralizumab SOSY 30 mg  30 mg Subcutaneous Q28 days Valentina Shaggy, MD   30 mg at 10/18/20 1542    PHYSICAL EXAMINATION:  ECOG PERFORMANCE STATUS: 0 - Asymptomatic   There  were no vitals filed for this visit.   There were no vitals filed for this visit.    Physical Exam Vitals and nursing note reviewed.  Constitutional:      General: She is not in acute distress.    Appearance: Normal appearance. She is obese. She is not ill-appearing, toxic-appearing or diaphoretic.  HENT:     Head: Normocephalic and atraumatic.     Right Ear: External ear normal.     Left Ear: External ear normal.     Nose: Nose normal. No congestion or rhinorrhea.  Eyes:     General: No scleral icterus.    Extraocular Movements: Extraocular movements intact.     Conjunctiva/sclera: Conjunctivae normal.     Pupils: Pupils are equal, round, and reactive to light.  Cardiovascular:     Rate and Rhythm: Normal rate.     Heart sounds: No murmur heard.    No friction rub. No gallop.  Pulmonary:     Effort: Pulmonary effort is normal. No respiratory distress.     Breath sounds: Normal breath sounds. No stridor. No wheezing or rhonchi.  Abdominal:     General: Bowel sounds are normal. There is no distension.     Palpations: Abdomen is soft. There is no mass.     Tenderness: There is no abdominal tenderness. There is no guarding or rebound.     Hernia: No hernia is present.  Musculoskeletal:        General: No swelling, tenderness or deformity.     Cervical back: Normal range of motion and neck supple. No rigidity or tenderness.     Right lower leg: Edema present.     Left lower leg: Edema present.  Lymphadenopathy:     Head:     Right side of head: No submental, submandibular, tonsillar, preauricular, posterior auricular or occipital adenopathy.     Left side of head: No submental, submandibular, tonsillar, preauricular, posterior auricular or occipital adenopathy.     Cervical: No cervical adenopathy.     Right cervical: No superficial, deep or posterior cervical adenopathy.    Left cervical: No superficial, deep or posterior cervical adenopathy.     Upper Body:     Right  upper body: No supraclavicular, axillary, pectoral or epitrochlear adenopathy.     Left upper body: No supraclavicular, axillary, pectoral or epitrochlear adenopathy.  Skin:    General: Skin is warm.     Coloration: Skin is not jaundiced or pale.     Findings: No erythema, lesion or rash.  Neurological:     General: No focal deficit present.     Mental Status: She is alert and oriented to person, place, and time. Mental status is at baseline.     Cranial Nerves: No cranial nerve deficit.  Psychiatric:        Mood and Affect: Mood normal.        Behavior: Behavior normal.        Thought Content: Thought content normal.        Judgment: Judgment normal.      LABORATORY DATA: I have personally reviewed the data as listed:  Office Visit on 03/29/2022  Component Date Value Ref Range Status  SARS-CoV-2, NAA 03/29/2022 Detected (A)  Not Detected Final   Comment: Patients who have a positive COVID-19 test result may now have treatment options. Treatment options are available for patients with mild to moderate symptoms and for hospitalized patients. Visit our website at [email or web address] for resources and information.    Influenza A, NAA 03/29/2022 Not Detected  Not Detected Final   Influenza B, NAA 03/29/2022 Not Detected  Not Detected Final   RSV, NAA 03/29/2022 Not Detected  Not Detected Final   Test Information: 03/29/2022 Comment   Final   Comment: This nucleic acid amplification test was developed and its performance characteristics determined by [employer]. Nucleic acid amplification tests include RT-PCR and TMA. This test has not been FDA cleared or approved. This test has been authorized by FDA under an Emergency Use Authorization (EUA). This test is only authorized for the duration of time the declaration that circumstances exist justifying the authorization of the emergency use of in vitro diagnostic tests for detection of SARS-CoV-2 virus and/or  diagnosis of COVID-19 infection under section 564(b)(1) of the Act, 21 U.S.C. 614ERX-5(Q) (1), unless the authorization is terminated or revoked sooner. When diagnostic testing is negative, the possibility of a false negative result should be considered in the context of a patient's recent exposures and the presence of clinical signs and symptoms consistent with COVID-19. An individual without symptoms of COVID-19 and who is not shedding SARS-CoV-2 virus wo                          uld expect to have a negative (not detected) result in this assay.    specimen status report 03/29/2022 Comment   Final   Comment: Please note Please note The date and/or time of collection was not indicated on the requisition as required by state and federal law.  The date of receipt of the specimen was used as the collection date if not supplied.     RADIOGRAPHIC STUDIES: I have personally reviewed the radiological images as listed and agree with the findings in the report  No results found.  ASSESSMENT/PLAN  61 year old female who was diagnosed with distal RLE DVT and small PE in July 2023.  VTE diagnosed in July 2023:  Patient presented to ED with SOB as well as pain and swelling in distal RLE.  CT PA showed small filling defect in a right upper lobe subsegmental artery suggesting small pulmonary embolism. LE U/S showed occlusive thrombus in the right peroneal vein.  Patient was placed on Xarelto in the ED and appropriately discharged home.    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  These have been in the form of surgery and childbirth.  The time frame of RLE injury makes it unlikely to be a major contributing factor.  The road trip to [facility] is also not likely a major factory.  Her main risk factor is likely obesity.  It is reasonable to classify this this as an unprovoked event.  Asthma is not  considered a risk factor for VTE  Duration of anticoagulation:  HERDOO2 score 2 .  7.4% risk of major VTE per 100 patient years.  Not low.  Continuation of anticoagulation recommended per that model.  In addition patient recently had covid which is a known risk factor for VTE's.       Cancer Staging  No matching  staging information was found for the patient.   No problem-specific Assessment & Plan notes found for this encounter.   No orders of the defined types were placed in this encounter.   All questions were answered. The patient knows to call the clinic with any problems, questions or concerns.  30 minutes spent in face to face contact with patient.    This note was electronically signed.    Barbee Cough, MD  04/20/2022 8:38 AM

## 2022-04-23 ENCOUNTER — Telehealth: Payer: Self-pay | Admitting: Oncology

## 2022-04-23 ENCOUNTER — Encounter: Payer: Self-pay | Admitting: *Deleted

## 2022-04-23 ENCOUNTER — Encounter: Payer: Self-pay | Admitting: Family Medicine

## 2022-04-23 ENCOUNTER — Ambulatory Visit (INDEPENDENT_AMBULATORY_CARE_PROVIDER_SITE_OTHER): Payer: Medicare HMO | Admitting: Family Medicine

## 2022-04-23 VITALS — BP 135/88 | HR 77 | Wt 195.8 lb

## 2022-04-23 DIAGNOSIS — Z5181 Encounter for therapeutic drug level monitoring: Secondary | ICD-10-CM

## 2022-04-23 DIAGNOSIS — U099 Post covid-19 condition, unspecified: Secondary | ICD-10-CM | POA: Diagnosis not present

## 2022-04-23 DIAGNOSIS — Z0000 Encounter for general adult medical examination without abnormal findings: Secondary | ICD-10-CM

## 2022-04-23 DIAGNOSIS — Z7901 Long term (current) use of anticoagulants: Secondary | ICD-10-CM | POA: Diagnosis not present

## 2022-04-23 DIAGNOSIS — Z13220 Encounter for screening for lipoid disorders: Secondary | ICD-10-CM | POA: Diagnosis not present

## 2022-04-23 DIAGNOSIS — I10 Essential (primary) hypertension: Secondary | ICD-10-CM | POA: Diagnosis not present

## 2022-04-23 DIAGNOSIS — Z1211 Encounter for screening for malignant neoplasm of colon: Secondary | ICD-10-CM | POA: Diagnosis not present

## 2022-04-23 MED ORDER — AMLODIPINE BESYLATE 5 MG PO TABS: 2.5000 mg | ORAL_TABLET | Freq: Every day | ORAL | 5 refills | Status: DC

## 2022-04-23 MED ORDER — TRIAMCINOLONE ACETONIDE 0.1 % EX CREA: TOPICAL_CREAM | CUTANEOUS | 1 refills | Status: DC

## 2022-04-23 MED ORDER — RIVAROXABAN 20 MG PO TABS: ORAL_TABLET | ORAL | 5 refills | Status: DC

## 2022-04-23 NOTE — Progress Notes (Signed)
   Subjective:    Patient ID: Stephanie [name], female    DOB: [birth year], 61 y.o.   MRN: [id number]  HPI Pt arrives for follow up. Pt had COVID on 03/25/22 and is still feeling bad; feeling fatigued.  Pt states legs are aching; right leg is giving her trouble. Right leg is swelling, numb and achy. Pt is having tightness in chest. Feels a sensation in upper back. Pt has been off balance also. Reports brain fog.  Itching on back at [facility] since COVID.  Review of Systems     Objective:   Physical Exam General-in no acute distress Eyes-no discharge Lungs-respiratory rate normal, CTA CV-no murmurs,RRR Extremities skin warm dry no edema Neuro grossly normal Behavior normal, alert        Assessment & Plan:  1. Encounter for subsequent annual wellness visit (AWV) in Medicare patient Adult wellness-complete.wellness physical was conducted today. Importance of diet and exercise were discussed in detail.  Importance of stress reduction and healthy living were discussed.  In addition to this a discussion regarding safety was also covered.  We also reviewed over immunizations and gave recommendations regarding current immunization needed for age.   In addition to this additional areas were also touched on including: Preventative health exams needed:  Colonoscopy being referred  Patient was advised yearly wellness exam   2. Essential hypertension Adjusting amlodipine to get better control HTN- patient seen for follow-up regarding HTN.   Diet, medication compliance, appropriate labs and refills were completed.   Importance of keeping blood pressure under good control to lessen the risk of complications discussed Regular follow-up visits discussed  - Basic metabolic panel  3. Screening for colon cancer Screening referred  4. Anticoagulation management encounter On long-term anticoagulants D-dimer recently elevated being followed by hematology  5. Encounter for screening  colonoscopy Referred - Ambulatory referral to Gastroenterology  6. Screening, lipid Screening - Lipid panel  7. Long COVID Patient with symptomatology of long COVID leg fatigue tiredness headaches make brain fog difficulty focusing-we talked about ways to cope with this gave her some printed information  Mild obesity healthy diet regular activity portion control

## 2022-04-23 NOTE — Telephone Encounter (Signed)
Left patient a voicemail regarding upcoming appointments  

## 2022-04-25 ENCOUNTER — Telehealth: Payer: Self-pay | Admitting: Family Medicine

## 2022-04-25 DIAGNOSIS — U071 COVID-19: Secondary | ICD-10-CM | POA: Insufficient documentation

## 2022-04-25 NOTE — Telephone Encounter (Signed)
Nurses-this is rather surprising since I just saw her 2 days ago and she was doing relatively well at that time.  Not quite sure why she is having such severe back pain. Please have her describe how her back pain is.  What is she doing for currently?  Is she doing any stretches? We may have to try something gentle like a muscle relaxer for home use only (I want to try to avoid opioids)

## 2022-04-25 NOTE — Telephone Encounter (Signed)
Pt calling in to the office in regards to hip and back pain. Pt was seen on Monday. Pt states pain in back is terrible. Unable to get out of bed this morning. Pt is requesting something for pain. Please advise. Thank you  [facility]

## 2022-04-26 NOTE — Telephone Encounter (Signed)
If not doing better by next week follow-up

## 2022-04-26 NOTE — Telephone Encounter (Signed)
Patient stated she woke up and the pain was so bad that day in he back in her hips but she is terrified of pain meds and wants to hold on pain med at this time- she is using he Tylenol and stretches and just gonna deal with it for now- it comes and goes

## 2022-04-27 NOTE — Telephone Encounter (Signed)
Patient to call back if ongoing issues

## 2022-05-19 ENCOUNTER — Other Ambulatory Visit: Payer: Self-pay | Admitting: Family Medicine

## 2022-05-22 ENCOUNTER — Ambulatory Visit: Payer: Medicare HMO | Admitting: Podiatry

## 2022-05-24 ENCOUNTER — Ambulatory Visit: Payer: Medicare HMO | Admitting: Podiatry

## 2022-05-24 ENCOUNTER — Encounter: Payer: Self-pay | Admitting: Podiatry

## 2022-05-24 VITALS — BP 128/74

## 2022-05-24 DIAGNOSIS — B353 Tinea pedis: Secondary | ICD-10-CM | POA: Diagnosis not present

## 2022-05-24 DIAGNOSIS — M79674 Pain in right toe(s): Secondary | ICD-10-CM

## 2022-05-24 DIAGNOSIS — B351 Tinea unguium: Secondary | ICD-10-CM | POA: Diagnosis not present

## 2022-05-24 DIAGNOSIS — M79675 Pain in left toe(s): Secondary | ICD-10-CM

## 2022-05-24 MED ORDER — CLOTRIMAZOLE-BETAMETHASONE 1-0.05 % EX CREA: 1.0000 | TOPICAL_CREAM | Freq: Two times a day (BID) | CUTANEOUS | 0 refills | Status: DC

## 2022-05-24 NOTE — Progress Notes (Signed)
  Subjective:  Patient ID: Stephanie [name], female    DOB: Aug 09, 1960,  MRN: [id number]  Chief Complaint  Patient presents with   Callouses    Callus. Nail trim itching    62 y.o. female returns for the above complaint.  Patient presents with thickened elongated dystrophic toenails x 10 mild pain on palpation hurts with ambulation worse with pressure who would like for me to debride down.  She also has secondary complaint dry skin to the left side.  She states that she is tried over-the-counter medication which has helped.  She would like to discuss prescription options for this.  Objective:   Vitals:   05/24/22 1041  BP: 128/74   Podiatric Exam: Vascular: dorsalis pedis and posterior tibial pulses are palpable bilateral. Capillary return is immediate. Temperature gradient is WNL. Skin turgor WNL  Sensorium: Normal Semmes Weinstein monofilament test. Normal tactile sensation bilaterally. Nail Exam: Pt has thick disfigured discolored nails with subungual debris noted bilateral entire nail hallux through fifth toenails.  Pain on palpation to the nails. Ulcer Exam: There is no evidence of ulcer or pre-ulcerative changes or infection. Orthopedic Exam: Muscle tone and strength are WNL. No limitations in general ROM. No crepitus or effusions noted.  Skin: No Porokeratosis. No infection or ulcers    Assessment & Plan:   1. Pain due to onychomycosis of toenails of both feet   2. Tinea pedis of left foot     Patient was evaluated and treated and all questions answered  Left plantar athlete's foot -I explained to the patient the etiology of athlete's foot errors options were discussed.  Given the mild to moderate nature of the episode patient would benefit from Lotrisone cream Lotrisone cream was sent to the pharmacy.  She states understanding  Onychomycosis with pain  -Nails palliatively debrided as below. -Educated on self-care  Procedure: Nail Debridement Rationale: pain  Type of  Debridement: manual, sharp debridement. Instrumentation: Nail nipper, rotary burr. Number of Nails: 10  Procedures and Treatment: Consent by patient was obtained for treatment procedures. The patient understood the discussion of treatment and procedures well. All questions were answered thoroughly reviewed. Debridement of mycotic and hypertrophic toenails, 1 through 5 bilateral and clearing of subungual debris. No ulceration, no infection noted.  Return Visit-Office Procedure: Patient instructed to return to the office for a follow up visit 3 months for continued evaluation and treatment.  Boneta Lucks, DPM    Return in about 3 months (around 08/23/2022).   Toenails x 10 Left athlete's foot Lotrisone cream

## 2022-05-28 ENCOUNTER — Ambulatory Visit (HOSPITAL_COMMUNITY): Payer: Medicare HMO | Admitting: Clinical

## 2022-05-28 ENCOUNTER — Ambulatory Visit: Payer: Medicare HMO | Admitting: Dermatology

## 2022-05-29 ENCOUNTER — Ambulatory Visit: Payer: Medicare HMO | Admitting: Gastroenterology

## 2022-05-30 ENCOUNTER — Encounter: Payer: Self-pay | Admitting: *Deleted

## 2022-05-31 ENCOUNTER — Encounter: Payer: Self-pay | Admitting: Gastroenterology

## 2022-06-29 ENCOUNTER — Encounter (HOSPITAL_COMMUNITY): Payer: Self-pay

## 2022-06-29 ENCOUNTER — Ambulatory Visit (INDEPENDENT_AMBULATORY_CARE_PROVIDER_SITE_OTHER): Payer: Medicare HMO | Admitting: Clinical

## 2022-06-29 DIAGNOSIS — F331 Major depressive disorder, recurrent, moderate: Secondary | ICD-10-CM | POA: Diagnosis not present

## 2022-06-29 DIAGNOSIS — F419 Anxiety disorder, unspecified: Secondary | ICD-10-CM | POA: Diagnosis not present

## 2022-06-29 NOTE — Progress Notes (Signed)
Virtual Visit via Video Note  I connected with Stephanie [name] on 06/29/22 at 10:00 AM EST by a video enabled telemedicine application and verified that I am speaking with the correct person using two identifiers.  Location: Patient: Home Provider: Office   I discussed the limitations of evaluation and management by telemedicine and the availability of in person appointments. The patient expressed understanding and agreed to proceed.    Comprehensive Clinical Assessment (CCA) Note  06/29/2022 [name] [id number]  Chief Complaint: Difficulty with Depression Visit Diagnosis: Recurrent Moderate MDD with Anxiety   CCA Screening, Triage and Referral (STR)  Patient Reported Information How did you hear about [place]? No data recorded Referral name: No data recorded Referral phone number: No data recorded  Whom do you see for routine medical problems? No data recorded Practice/Facility Name: No data recorded Practice/Facility Phone Number: No data recorded Name of Contact: No data recorded Contact Number: No data recorded Contact Fax Number: No data recorded Prescriber Name: No data recorded Prescriber Address (if known): No data recorded  What Is the Reason for Your Visit/Call Today? No data recorded How Long Has This Been Causing You Problems? No data recorded What Do You Feel Would Help You the Most Today? No data recorded  Have You Recently Been in Any Inpatient Treatment (Hospital/Detox/Crisis Center/28-Day Program)? No data recorded Name/Location of Program/Hospital:No data recorded How Long Were You There? No data recorded When Were You Discharged? No data recorded  Have You Ever Received Services From [facility] Before? No data recorded Who Do You See at [facility]? No data recorded  Have You Recently Had Any Thoughts About Hurting Yourself? No data recorded Are You Planning to Commit Suicide/Harm Yourself At This time? No data recorded  Have you Recently Had  Thoughts About [place]? No data recorded Explanation: No data recorded  Have You Used Any Alcohol or Drugs in the Past 24 Hours? No data recorded How Long Ago Did You Use Drugs or Alcohol? No data recorded What Did You Use and How Much? No data recorded  Do You Currently Have a Therapist/Psychiatrist? No data recorded Name of Therapist/Psychiatrist: No data recorded  Have You Been Recently Discharged From Any Office Practice or Programs? No data recorded Explanation of Discharge From Practice/Program: No data recorded    CCA Screening Triage Referral Assessment Type of Contact: No data recorded Is this Initial or Reassessment? No data recorded Date Telepsych consult ordered in [facility]:  No data recorded Time Telepsych consult ordered in [facility]:  No data recorded  Patient Reported Information Reviewed? No data recorded Patient Left Without Being Seen? No data recorded Reason for Not Completing Assessment: No data recorded  Collateral Involvement: No data recorded  Does Patient Have a Eagle? No data recorded Name and Contact of Legal Guardian: No data recorded If Minor and Not Living with Parent(s), Who has Custody? No data recorded Is CPS involved or ever been involved? No data recorded Is APS involved or ever been involved? No data recorded  Patient Determined To Be At Risk for Harm To Self or Others Based on Review of Patient Reported Information or Presenting Complaint? No data recorded Method: No data recorded Availability of Means: No data recorded Intent: No data recorded Notification Required: No data recorded Additional Information for Danger to Others Potential: No data recorded Additional Comments for Danger to Others Potential: No data recorded Are There Guns or Other Weapons in Your Home? No data recorded Types  of Guns/Weapons: No data recorded Are These Weapons Safely Secured?                            No data recorded Who Could  Verify You Are Able To Have These Secured: No data recorded Do You Have any Outstanding Charges, Pending Court Dates, Parole/Probation? No data recorded Contacted To Inform of Risk of Harm To Self or Others: No data recorded  Location of Assessment: No data recorded  Does Patient Present under Involuntary Commitment? No data recorded IVC Papers Initial File Date: No data recorded  [place] of Residence: No data recorded  Patient Currently Receiving the Following Services: No data recorded  Determination of Need: No data recorded  Options For Referral: No data recorded    CCA Biopsychosocial Intake/Chief Complaint:  The patient referred by [place] with Dr. Birdie Hopes for further evaluation for [facility] treatment services with indication of difficulty with Depression.  Current Symptoms/Problems: The patient notes she is chosing to get off of her medication and try Counseling.   Patient Reported Schizophrenia/Schizoaffective Diagnosis in Past: No   Strengths: The patient notes, "  Preferences: Reading, Walking, Listening to Music  Abilities: None noted.   Type of Services Patient Feels are Needed: The patient is currently receiving med management through her PCP , however, is working to stop med therapy and looking to transition to trying just counseling for her Depression   Initial Clinical Notes/Concerns: The patient is self determining to stop her med therapy provided currently with her PCP and to transition to trying therapy only. The patient notes no prior counseling. No prior hospitalization for behavioral health. No current S/I or H/I   Mental Health Symptoms Depression:   Change in energy/activity; Difficulty Concentrating; Fatigue; Hopelessness; Tearfulness; Sleep (too much or little); Irritability; Worthlessness; Weight gain/loss   Duration of Depressive symptoms:  Greater than two weeks   Mania:   None   Anxiety:    Worrying; Tension; Difficulty  concentrating; Irritability; Fatigue; Restlessness; Sleep   Psychosis:   None   Duration of Psychotic symptoms: NA  Trauma:   None   Obsessions:   None   Compulsions:   None   Inattention:   None   Hyperactivity/Impulsivity:  NA  Oppositional/Defiant Behaviors:   None   Emotional Irregularity:   None   Other Mood/Personality Symptoms:  NA   Mental Status Exam Appearance and self-care  Stature:   Small   Weight:   Overweight   Clothing:   Casual   Grooming:   Normal   Cosmetic use:   Age appropriate   Posture/gait:   Normal   Motor activity:   Not Remarkable   Sensorium  Attention:   Normal   Concentration:   Anxiety interferes   Orientation:   X5   Recall/memory:   Defective in Short-term   Affect and Mood  Affect:   Appropriate   Mood:   Anxious; Depressed   Relating  Eye contact:   Normal   Facial expression:   Responsive   Attitude toward examiner:   Cooperative   Thought and Language  Speech flow:  Normal   Thought content:   Appropriate to Mood and Circumstances   Preoccupation:   None   Hallucinations:   None   Organization:  Logical  Transport planner of Knowledge:   Good   Intelligence:   Average   Abstraction:   Normal   Judgement:  Good   Reality Testing:   Realistic   Insight:   Good   Decision Making:   Normal   Social Functioning  Social Maturity:   Isolates   Social Judgement:   Normal   Stress  Stressors:   Grief/losses; Illness; Financial (The patient notes a brother and a neice have passed away in the past year.)   Coping Ability:   Normal   Skill Deficits:   None   Supports:   Friends/Service system; Family; Church     Religion: Religion/Spirituality Are You A Religious Person?: No How Might This Affect Treatment?: NA  Leisure/Recreation: Leisure / Recreation Do You Have Hobbies?: No  Exercise/Diet: Exercise/Diet Do You Exercise?: No Have You  Gained or Lost A Significant Amount of Weight in the Past Six Months?: No Do You Follow a Special Diet?: No Do You Have Any Trouble Sleeping?: Yes Explanation of Sleeping Difficulties: The patient notes difficulty with falling asleep as well as staying asleep   CCA Employment/Education Employment/Work Situation: Employment / Work Situation Employment Situation: On disability Why is Patient on Disability: The patient was placed on Disability due to physical health problems. How Long has Patient Been on Disability: Since 2018 Patient's Job has Been Impacted by Current Illness: No What is the Longest Time Patient has Held a Job?: 14 Where was the Patient Employed at that Time?: [employer] Has Patient ever Been in the [employer]?: No  Education: Education Is Patient Currently Attending School?: No Last Grade Completed: 12 Name of High School: [employer]. Did You Graduate From [employer]?: Yes Did You Attend College?: Yes What Type of College Degree Do you Have?: [employer] in [place] Did [place]?: No What Was Your Major?: NA Did You Have Any Special Interests In School?: NA Did You Have An Individualized Education Program (IIEP): No Did You Have Any Difficulty At School?: No Patient's Education Has Been Impacted by Current Illness: No   CCA Family/Childhood History Family and Relationship History: Family history Marital status: Divorced Divorced, when?: 2010 What types of issues is patient dealing with in the relationship?: The patient notes no conflict with her ex-husband Additional relationship information: NA Are you sexually active?: No What is your sexual orientation?: Heterosexual Has your sexual activity been affected by drugs, alcohol, medication, or emotional stress?: NA Does patient have children?: Yes How many children?: 3 How is patient's relationship with their children?: The  patient notes, " I have a close relationship with my children currently".  Childhood History:  Childhood History By whom was/is the patient raised?: Both parents Additional childhood history information: No Additional Description of patient's relationship with caregiver when they were a child: The patient notes , " I hada very close and great relationship with both my parents. Patient's description of current relationship with people who raised him/her: The patient notes both of her parents are deceased. How were you disciplined when you got in trouble as a child/adolescent?: Grounded Does patient have siblings?: Yes Number of Siblings: 34 Description of patient's current relationship with siblings: The patient notes, " We are all very close and we get together and its fun". Did patient suffer any verbal/emotional/physical/sexual abuse as a child?: No Did patient suffer from severe childhood neglect?: No Has patient ever been sexually abused/assaulted/raped as an adolescent or adult?: No Was the patient ever a victim of a crime or a disaster?: No Witnessed domestic violence?: No Has patient been affected by domestic violence as  an adult?: No  Child/Adolescent Assessment:     CCA Substance Use Alcohol/Drug Use: Alcohol / Drug Use Pain Medications: See MAR Prescriptions: See MAR Over the Counter: Vitamins. History of alcohol / drug use?: No history of alcohol / drug abuse Longest period of sobriety (when/how long): NA                         ASAM's:  Six Dimensions of Multidimensional Assessment  Dimension 1:  Acute Intoxication and/or Withdrawal Potential:      Dimension 2:  Biomedical Conditions and Complications:      Dimension 3:  Emotional, Behavioral, or Cognitive Conditions and Complications:     Dimension 4:  Readiness to Change:     Dimension 5:  Relapse, Continued use, or Continued Problem Potential:     Dimension 6:  Recovery/Living Environment:     ASAM  Severity Score:    ASAM Recommended Level of Treatment:     Substance use Disorder (SUD)    Recommendations for Services/Supports/Treatments: Recommendations for Services/Supports/Treatments Recommendations For Services/Supports/Treatments: Individual Therapy, Medication Management  DSM5 Diagnoses: Patient Active Problem List   Diagnosis Date Noted   COVID-19 04/25/2022   Dvt femoral (deep venous thrombosis) ([place]) 01/19/2022   Anticoagulation management encounter 01/19/2022   History of revision of total replacement of left hip joint 12/13/2020   Varicose veins of left lower extremity 10/14/2020   Primary osteoarthritis of left hip 08/17/2020   Screening for colon cancer 03/07/2020   Post-menopausal bleeding 03/07/2020   Need for vaccination 03/07/2020   OA (osteoarthritis) of hip 03/27/2017   History of contact dermatitis 02/11/2017   Osteoarthritis, hip, bilateral 07/03/2015   Moderate persistent asthma 04/27/2015   Allergic rhinoconjunctivitis 04/27/2015   Right-sided low back pain with right-sided sciatica 02/22/2015   Essential hypertension 10/17/2012    Patient Centered Plan: Patient is on the following Treatment Plan(s):  Recurrent Moderate MDD with Anxiety   Referrals to Alternative Service(s): Referred to Alternative Service(s):   Place:   Date:   Time:    Referred to Alternative Service(s):   Place:   Date:   Time:    Referred to Alternative Service(s):   Place:   Date:   Time:    Referred to Alternative Service(s):   Place:   Date:   Time:      Collaboration of Care: No additional collaboration.  Patient/Guardian was advised Release of Information must be obtained prior to any record release in order to collaborate their care with an outside provider. Patient/Guardian was advised if they have not already done so to contact the registration department to sign all necessary forms in order for [place] to release information regarding their care.   Consent:  Patient/Guardian gives verbal consent for treatment and assignment of benefits for services provided during this visit. Patient/Guardian expressed understanding and agreed to proceed.    I discussed the assessment and treatment plan with the patient. The patient was provided an opportunity to ask questions and all were answered. The patient agreed with the plan and demonstrated an understanding of the instructions.   The patient was advised to call back or seek an in-person evaluation if the symptoms worsen or if the condition fails to improve as anticipated.  I provided 60 minutes of non-face-to-face time during this encounter.  Lennox Grumbles, LCSW  06/30/2023

## 2022-07-04 ENCOUNTER — Encounter: Payer: Self-pay | Admitting: Gastroenterology

## 2022-07-04 ENCOUNTER — Ambulatory Visit: Payer: Medicare HMO | Admitting: Gastroenterology

## 2022-07-04 VITALS — BP 124/78 | HR 66 | Ht 64.0 in | Wt 196.0 lb

## 2022-07-04 DIAGNOSIS — Z1211 Encounter for screening for malignant neoplasm of colon: Secondary | ICD-10-CM

## 2022-07-04 DIAGNOSIS — Z13220 Encounter for screening for lipoid disorders: Secondary | ICD-10-CM | POA: Diagnosis not present

## 2022-07-04 DIAGNOSIS — I10 Essential (primary) hypertension: Secondary | ICD-10-CM | POA: Diagnosis not present

## 2022-07-04 DIAGNOSIS — Z7189 Other specified counseling: Secondary | ICD-10-CM | POA: Diagnosis not present

## 2022-07-04 DIAGNOSIS — I8290 Acute embolism and thrombosis of unspecified vein: Secondary | ICD-10-CM | POA: Diagnosis not present

## 2022-07-04 DIAGNOSIS — Z7901 Long term (current) use of anticoagulants: Secondary | ICD-10-CM | POA: Diagnosis not present

## 2022-07-04 MED ORDER — CLENPIQ 10-3.5-12 MG-GM -GM/175ML PO SOLN: 1.0000 | Freq: Once | ORAL | 0 refills | Status: AC

## 2022-07-04 NOTE — Patient Instructions (Signed)
You have been scheduled for a colonoscopy. Please follow written instructions given to you at your visit today.  Please pick up your prep supplies at the pharmacy within the next 1-3 days. If you use inhalers (even only as needed), please bring them with you on the day of your procedure.  You will be contaced by our office prior to your procedure for directions on holding your Coumadin/Warfarin.  If you do not hear from our office 1 week prior to your scheduled procedure, please call [phone] to discuss.    _______________________________________________________  If your blood pressure at your visit was 140/90 or greater, please contact your primary care physician to follow up on this.  _______________________________________________________  If you are age 78 or older, your body mass index should be between 23-30. Your Body mass index is 33.64 kg/m. If this is out of the aforementioned range listed, please consider follow up with your Primary Care Provider.  If you are age 12 or younger, your body mass index should be between 19-25. Your Body mass index is 33.64 kg/m. If this is out of the aformentioned range listed, please consider follow up with your Primary Care Provider.   __________________________________________________________  The [facility] providers would like to encourage you to use [facility] to communicate with providers for non-urgent requests or questions.  Due to long hold times on the telephone, sending your provider a message by [facility] may be a faster and more efficient way to get a response.  Please allow 48 business hours for a response.  Please remember that this is for non-urgent requests.    Thank you for choosing me and [facility].  Vito Cirigliano, D.O.

## 2022-07-04 NOTE — Progress Notes (Signed)
Chief Complaint: Colon cancer screening, medication management   Referring Provider:    Kathyrn Drown, MD    HPI:     ROSAISELA LONA is a 62 y.o. 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  She is otherwise without any GI symptoms.  No change in bowel habits, hematochezia, melena, abdominal pain, etc.  No prior colonoscopy or any CRC screening.   She did have COVID 03/2022.    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  She follows in the Hematology clinic, last seen 04/20/2022 by Dr. Federico Flake. She has f/u on 3/15 and may possibly d/c AC at that time.   No known family history of CRC, GI malignancy, liver disease, pancreatic disease, or IBD.       Latest Ref Rng & Units 04/20/2022    9:33 AM 01/19/2022   12:15 PM 12/21/2021    2:59 PM  CBC  WBC 4.0 - 10.5 K/uL 5.2  5.5  7.4   Hemoglobin 12.0 - 15.0 g/dL 13.8  14.2  14.6   Hematocrit 36.0 - 46.0 % 41.0  42.2  42.2   Platelets 150 - 400 K/uL 263  274  285       Latest Ref Rng & Units 04/20/2022    9:33 AM 01/19/2022   12:15 PM 12/21/2021    2:59 PM  CMP  Glucose 70 - 99 mg/dL 94  91  92   BUN 6 - 20 mg/dL 9  9  9   $ Creatinine 0.44 - 1.00 mg/dL 0.57  0.56  0.60   Sodium 135 - 145 mmol/L 141  139  140   Potassium 3.5 - 5.1 mmol/L 3.8  3.7  3.7   Chloride 98 - 111 mmol/L 106  106  102   CO2 22 - 32 mmol/L 30  28  23   $ Calcium 8.9 - 10.3 mg/dL 9.7  9.4  9.8   Total Protein 6.5 - 8.1 g/dL 7.3  7.6  7.7   Total Bilirubin 0.3 - 1.2 mg/dL 1.0  0.8  0.4   Alkaline Phos 38 - 126 U/L 95  83  110   AST 15 - 41 U/L 14  16  17   $ ALT 0 - 44 U/L 13  16  18      $ Past Medical History:  Diagnosis Date   Allergic rhinitis    Allergy    Arthritis    Asthma    Depression    GERD (gastroesophageal  reflux disease)    Heart murmur    Hypertension    Pneumonia    hx of x 3      Past Surgical History:  Procedure Laterality Date   CESAREAN SECTION  1985, [place] Right 03/27/2017   Procedure: RIGHT TOTAL HIP ARTHROPLASTY ANTERIOR APPROACH;  Surgeon: Gaynelle Arabian, MD;  Location: [facility];  Service: Orthopedics;  Laterality: Right;   TOTAL HIP ARTHROPLASTY Left 08/17/2020   Procedure: TOTAL HIP ARTHROPLASTY ANTERIOR APPROACH;  Surgeon: Gaynelle Arabian, MD;  Location: [facility];  Service: Orthopedics;  Laterality: Left;  120mn   Family History  Problem Relation Age of Onset   Hypertension Mother  Hypertension Father    Stomach cancer Niece    Breast cancer Niece    Colon cancer Neg Hx    Esophageal cancer Neg Hx    Rectal cancer Neg Hx    Social History   Tobacco Use   Smoking status: Former    Packs/day: 0.25    Years: 18.00    Total pack years: 4.50    Types: Cigarettes    Start date: 09/24/1994    Quit date: 03/06/2013    Years since quitting: 9.3   Smokeless tobacco: Never  Vaping Use   Vaping Use: Never used  Substance Use Topics   Alcohol use: Not Currently    Alcohol/week: 2.0 - 3.0 standard drinks of alcohol    Types: 2 - 3 Glasses of wine per week   Drug use: No   Current Outpatient Medications  Medication Sig Dispense Refill   albuterol (PROVENTIL HFA) 108 (90 Base) MCG/ACT inhaler Inhale 2 puffs into the lungs every 4 (four) hours as needed for wheezing or shortness of breath. 18 g 5   albuterol (PROVENTIL) (2.5 MG/3ML) 0.083% nebulizer solution TAKE 45m BY NEBULIZER EVERY 4 HOURS AS NEEDED FOR WHEEZING OR SHORTNESS OF BREATH (Patient taking differently: Take 2.5 mg by nebulization every 4 (four) hours as needed for shortness of breath or wheezing.) 180 mL 1   amLODipine (NORVASC) 2.5 MG tablet TAKE 1 TABLET BY MOUTH DAILY 90 tablet 1   amLODipine (NORVASC) 5 MG tablet Take 0.5 tablets (2.5 mg total) by mouth  daily. Takes 1 tablet 2.5 mg daily. 30 tablet 5   arformoterol (BROVANA) 15 MCG/2ML NEBU Take 2 mLs (15 mcg total) by nebulization 2 (two) times daily. 200 mL 3   Ascorbic Acid (VITAMIN C) 1000 MG tablet Take 1,000 mg by mouth daily.     Cholecalciferol (VITAMIN D3) 50 MCG (2000 UT) TABS Take by mouth daily at 6 (six) AM.     clotrimazole-betamethasone (LOTRISONE) cream Apply 1 Application topically 2 (two) times daily. 30 g 0   EPINEPHrine (EPIPEN 2-PAK) 0.3 mg/0.3 mL IJ SOAJ injection Inject 0.3 mg into the muscle once as needed for anaphylaxis (allergic reaction). 2 each 1   fluticasone (FLOVENT HFA) 110 MCG/ACT inhaler 2 puff twice a day for asthma rinse after use 1 each 3   GARLIC PO Take by mouth.     ketoconazole (NIZORAL) 2 % cream Apply cream to affect area BID 30 g 1   rivaroxaban (XARELTO) 20 MG TABS tablet TAKE 1 TABLET BY MOUTH EVERY DAY WITH SUPPER 30 tablet 5   Sod Picosulfate-Mag Ox-Cit Acd (CLENPIQ) 10-3.5-12 MG-GM -GM/160ML SOLN Take 1 kit by mouth as directed. 320 mL 0   triamcinolone (NASACORT) 55 MCG/ACT AERO nasal inhaler Place 1 spray into the nose daily. 16.9 g 5   triamcinolone cream (KENALOG) 0.1 % Apply thin amount bid prn to spots that itch 15 g 1   TRUE METRIX BLOOD GLUCOSE TEST test strip SMARTSIG:Via Meter     TRUEplus Lancets 33G MISC      vitamin E 180 MG (400 UNITS) capsule Take 400 Units by mouth daily.     Zinc 50 MG CAPS Take by mouth daily at 6 (six) AM.     methocarbamol (ROBAXIN) 500 MG tablet Take by mouth every 6 (six) hours as needed for muscle spasms. (Patient not taking: Reported on 07/04/2022)     omeprazole (PRILOSEC) 40 MG capsule Take 1 capsule (40 mg total) by mouth daily. (Patient not taking:  Reported on 07/04/2022) 30 capsule 5   Current Facility-Administered Medications  Medication Dose Route Frequency Provider Last Rate Last Admin   Benralizumab SOSY 30 mg  30 mg Subcutaneous Q28 days Valentina Shaggy, MD   30 mg at 10/18/20 1542    Allergies  Allergen Reactions   Amoxicillin Rash    fash swelled up and itching 04/2018   Aspirin Anaphylaxis   Other Anaphylaxis    Nsaids   Hydrocodone Itching        Latex Hives and Itching   Oxycodone     itching   Levaquin [Levofloxacin]     Rash on chest no hives 04/2018     Review of Systems: All systems reviewed and negative except where noted in HPI.     Physical Exam:    Wt Readings from Last 3 Encounters:  07/04/22 196 lb (88.9 kg)  04/23/22 195 lb 12.8 oz (88.8 kg)  04/20/22 193 lb (87.5 kg)    BP 124/78   Pulse 66   Ht 5' 4"$  (1.626 m)   Wt 196 lb (88.9 kg)   LMP 09/17/2016 (Approximate)   BMI 33.64 kg/m  Constitutional:  Pleasant, in no acute distress. Psychiatric: Normal mood and affect. Behavior is normal. Cardiovascular: Normal rate, regular rhythm. No edema Pulmonary/chest: Effort normal and breath sounds normal. No wheezing, rales or rhonchi. Abdominal: Soft, nondistended, nontender. Bowel sounds active throughout. There are no masses palpable. No hepatomegaly. Neurological: Alert and oriented to person place and time. Skin: Skin is warm and dry. No rashes noted.   ASSESSMENT AND PLAN;   1) Colon cancer screening Overdue for routine CRC screening.  Discussed screening options at length today, to include optical colonoscopy, virtual colonoscopy, Cologuard, FIT kit testing, etc. and she would like to proceed with optical colonoscopy - Schedule colonoscopy.  Will schedule to be done after 07/27/2022 when she has a chance to follow-up with her Hematologist, as she reports there is a chance that they could be discontinuing her anticoagulation  2) History of VTE 3) History of PE 4) Chronic anticoagulation/Medication management - As above, plan to schedule colonoscopy sometime after 07/27/2022. -I asked her to contact me after her Hematology appointment to update me on the decision regarding anticoagulation.  If she is to continue anticoagulation,  we will coordinate with her Hematologist for holding Xarelto x2 days for colonoscopy (day prior and day of procedure)   The indications, risks, and benefits of colonoscopy were explained to the patient in detail. Risks include but are not limited to bleeding, perforation, adverse reaction to medications, and cardiopulmonary compromise. Sequelae include but are not limited to the possibility of surgery, hospitalization, and mortality. The patient verbalized understanding and wished to proceed. All questions answered, referred to the scheduler and bowel prep ordered. Further recommendations pending results of the exam.      Lavena Bullion, DO, FACG  07/04/2022, 9:54 AM   Wolfgang Phoenix, Elayne Snare, MD

## 2022-07-05 LAB — BASIC METABOLIC PANEL
BUN/Creatinine Ratio: 11 — ABNORMAL LOW (ref 12–28)
BUN: 7 mg/dL — ABNORMAL LOW (ref 8–27)
CO2: 22 mmol/L (ref 20–29)
Calcium: 9.6 mg/dL (ref 8.7–10.3)
Chloride: 104 mmol/L (ref 96–106)
Creatinine, Ser: 0.64 mg/dL (ref 0.57–1.00)
Glucose: 89 mg/dL (ref 70–99)
Potassium: 4.1 mmol/L (ref 3.5–5.2)
Sodium: 141 mmol/L (ref 134–144)
eGFR: 101 mL/min/{1.73_m2} (ref >59)

## 2022-07-05 LAB — LIPID PANEL
Chol/HDL Ratio: 2.4 ratio (ref 0.0–4.4)
Cholesterol, Total: 180 mg/dL (ref 100–199)
HDL: 75 mg/dL (ref >39)
LDL Chol Calc (NIH): 83 mg/dL (ref 0–99)
Triglycerides: 125 mg/dL (ref 0–149)
VLDL Cholesterol Cal: 22 mg/dL (ref 5–40)

## 2022-07-09 ENCOUNTER — Telehealth: Payer: Self-pay

## 2022-07-09 ENCOUNTER — Encounter (HOSPITAL_COMMUNITY): Payer: Self-pay | Admitting: Emergency Medicine

## 2022-07-09 ENCOUNTER — Emergency Department (HOSPITAL_COMMUNITY): Payer: Medicare HMO

## 2022-07-09 ENCOUNTER — Other Ambulatory Visit: Payer: Self-pay

## 2022-07-09 ENCOUNTER — Emergency Department (HOSPITAL_COMMUNITY)
Admission: EM | Admit: 2022-07-09 | Discharge: 2022-07-09 | Disposition: A | Discharge status: Home / Self Care | Payer: Medicare HMO | Attending: Emergency Medicine | Admitting: Emergency Medicine

## 2022-07-09 ENCOUNTER — Telehealth: Payer: Self-pay | Admitting: *Deleted

## 2022-07-09 DIAGNOSIS — R0789 Other chest pain: Secondary | ICD-10-CM | POA: Diagnosis not present

## 2022-07-09 DIAGNOSIS — R200 Anesthesia of skin: Secondary | ICD-10-CM | POA: Diagnosis present

## 2022-07-09 DIAGNOSIS — R040 Epistaxis: Secondary | ICD-10-CM | POA: Insufficient documentation

## 2022-07-09 DIAGNOSIS — Z91040 Latex allergy status: Secondary | ICD-10-CM | POA: Diagnosis not present

## 2022-07-09 DIAGNOSIS — R079 Chest pain, unspecified: Secondary | ICD-10-CM | POA: Insufficient documentation

## 2022-07-09 DIAGNOSIS — M5126 Other intervertebral disc displacement, lumbar region: Secondary | ICD-10-CM | POA: Diagnosis not present

## 2022-07-09 DIAGNOSIS — Z7901 Long term (current) use of anticoagulants: Secondary | ICD-10-CM | POA: Insufficient documentation

## 2022-07-09 DIAGNOSIS — R1030 Lower abdominal pain, unspecified: Secondary | ICD-10-CM | POA: Diagnosis not present

## 2022-07-09 DIAGNOSIS — R202 Paresthesia of skin: Secondary | ICD-10-CM

## 2022-07-09 DIAGNOSIS — M47816 Spondylosis without myelopathy or radiculopathy, lumbar region: Secondary | ICD-10-CM | POA: Diagnosis not present

## 2022-07-09 LAB — COMPREHENSIVE METABOLIC PANEL
ALT: 16 U/L (ref 0–44)
AST: 21 U/L (ref 15–41)
Albumin: 3.8 g/dL (ref 3.5–5.0)
Alkaline Phosphatase: 81 U/L (ref 38–126)
Anion gap: 6 (ref 5–15)
BUN: 13 mg/dL (ref 6–20)
CO2: 26 mmol/L (ref 22–32)
Calcium: 8.7 mg/dL — ABNORMAL LOW (ref 8.9–10.3)
Chloride: 105 mmol/L (ref 98–111)
Creatinine, Ser: 0.44 mg/dL (ref 0.44–1.00)
GFR, Estimated: >60 mL/min (ref >60)
Glucose, Bld: 102 mg/dL — ABNORMAL HIGH (ref 70–99)
Potassium: 3.6 mmol/L (ref 3.5–5.1)
Sodium: 137 mmol/L (ref 135–145)
Total Bilirubin: 0.9 mg/dL (ref 0.3–1.2)
Total Protein: 7.4 g/dL (ref 6.5–8.1)

## 2022-07-09 LAB — CBC WITH DIFFERENTIAL/PLATELET
Abs Immature Granulocytes: 0.01 10*3/uL (ref 0.00–0.07)
Basophils Absolute: 0.1 10*3/uL (ref 0.0–0.1)
Basophils Relative: 1 %
Eosinophils Absolute: 0.4 10*3/uL (ref 0.0–0.5)
Eosinophils Relative: 5 %
HCT: 40.6 % (ref 36.0–46.0)
Hemoglobin: 13.1 g/dL (ref 12.0–15.0)
Immature Granulocytes: 0 %
Lymphocytes Relative: 25 %
Lymphs Abs: 1.9 10*3/uL (ref 0.7–4.0)
MCH: 29 pg (ref 26.0–34.0)
MCHC: 32.3 g/dL (ref 30.0–36.0)
MCV: 90 fL (ref 80.0–100.0)
Monocytes Absolute: 0.5 10*3/uL (ref 0.1–1.0)
Monocytes Relative: 7 %
Neutro Abs: 4.6 10*3/uL (ref 1.7–7.7)
Neutrophils Relative %: 62 %
Platelets: 253 10*3/uL (ref 150–400)
RBC: 4.51 MIL/uL (ref 3.87–5.11)
RDW: 15.5 % (ref 11.5–15.5)
WBC: 7.4 10*3/uL (ref 4.0–10.5)
nRBC: 0 % (ref 0.0–0.2)

## 2022-07-09 LAB — URINALYSIS, ROUTINE W REFLEX MICROSCOPIC
Bilirubin Urine: NEGATIVE
Glucose, UA: NEGATIVE mg/dL
Hgb urine dipstick: NEGATIVE
Ketones, ur: NEGATIVE mg/dL
Leukocytes,Ua: NEGATIVE
Nitrite: NEGATIVE
Protein, ur: NEGATIVE mg/dL
Specific Gravity, Urine: 1.013 (ref 1.005–1.030)
pH: 6 (ref 5.0–8.0)

## 2022-07-09 LAB — LIPASE, BLOOD: Lipase: 39 U/L (ref 11–51)

## 2022-07-09 LAB — PROTIME-INR
INR: 1.1 (ref 0.8–1.2)
Prothrombin Time: 13.6 seconds (ref 11.4–15.2)

## 2022-07-09 MED ORDER — IOHEXOL 350 MG/ML SOLN
80.0000 mL | Freq: Once | INTRAVENOUS | Status: AC | PRN
  Administered 2022-07-09: 80 mL via INTRAVENOUS

## 2022-07-09 NOTE — Telephone Encounter (Signed)
I agree with this assessment

## 2022-07-09 NOTE — Telephone Encounter (Signed)
Patient called and stated she is on a blood thinner and has started having nose bleeds and spitting up blood. Patient states her stomach also feels tingly and strange and she is feeling light headed and weak and want to know if she stops the blood thinner. Patient advised to go straight to ER for evaluation and treatment. Patient verbalized understanding and stated she would be heading to [place]

## 2022-07-09 NOTE — ED Provider Triage Note (Signed)
Emergency Medicine Provider Triage Evaluation Note  Stephanie [name] , a 62 y.o. female  was evaluated in triage.  Pt complains of abd discomfort, nose bleed/spitting blood (has had light bleeding from the nose for a few weeks, bleeding worse today). Reports lateral thigh numbness, back pain for a few days, known back problems. No loss of bowel or bladder control, no saddle paresthesia. On Xarelto.   Review of Systems  Positive: As above Negative: As above  Physical Exam  BP (!) 138/97 (BP Location: Left Arm)   Pulse 76   Temp 98.5 F (36.9 C) (Oral)   Resp 18   Ht '5\' 4"'$  (1.626 m)   Wt 88.9 kg   LMP 09/17/2016 (Approximate)   SpO2 98%   BMI 33.64 kg/m  Gen:   Awake, no distress   Resp:  Normal effort  MSK:   Moves extremities without difficulty  Other:    Medical Decision Making  Medically screening exam initiated at 2:33 PM.  Appropriate orders placed.  Stephanie [name] was informed that the remainder of the evaluation will be completed by another provider, this initial triage assessment does not replace that evaluation, and the importance of remaining in the ED until their evaluation is complete.     Tacy Learn, PA-C 07/09/22 1434

## 2022-07-09 NOTE — ED Triage Notes (Signed)
Patient arrives ambulatory c/o "funny feeling in stomach" x 1 week. Patient reports a pin sensation to lower abdomen and feels weak in both legs. Patient also adds having epistaxis this morning. Denies any urinary symptoms.

## 2022-07-09 NOTE — Telephone Encounter (Signed)
Error

## 2022-07-09 NOTE — ED Provider Notes (Signed)
[facility] Provider Note   CSN: [id number] Arrival date & time: 07/09/22  1404     History  Chief Complaint  Patient presents with   Epistaxis   Abdominal Pain    STEPHNIE [name] is a 62 y.o. 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  Patient reports that she has noted for the past several days numbness sensation across the lower abdomen into her lower legs and weakness with walking.  She denies incontinence.  She suffers from chronic low back pain.  She denies history of spinal surgery.  She also reports that today she was having a nosebleed this morning, and felt that she was either coughing up or spitting up blood.  She says she felt lightheaded as well.  She reports compliance with all of her medications.  HPI     Home Medications Prior to Admission medications   Medication Sig Start Date End Date Taking? Authorizing Provider  albuterol (PROVENTIL HFA) 108 (90 Base) MCG/ACT inhaler Inhale 2 puffs into the lungs every 4 (four) hours as needed for wheezing or shortness of breath. 06/20/21   Kozlow, Donnamarie Poag, MD  albuterol (PROVENTIL) (2.5 MG/3ML) 0.083% nebulizer solution TAKE 63m BY NEBULIZER EVERY 4 HOURS AS NEEDED FOR WHEEZING OR SHORTNESS OF BREATH Patient taking differently: Take 2.5 mg by nebulization every 4 (four) hours as needed for shortness of breath or wheezing. 08/23/21   Kozlow, EDonnamarie Poag MD  amLODipine (NORVASC) 2.5 MG tablet TAKE 1 TABLET BY MOUTH DAILY 05/21/22   LKathyrn Drown MD  amLODipine (NORVASC) 5 MG tablet Take 0.5 tablets (2.5 mg total) by mouth daily. Takes 1 tablet 2.5 mg daily. 04/23/22   LKathyrn Drown MD  arformoterol (BROVANA) 15 MCG/2ML NEBU Take 2 mLs (15 mcg total) by nebulization 2 (two) times daily. 06/22/21   Kozlow, EDonnamarie Poag MD  Ascorbic Acid (VITAMIN C) 1000 MG tablet Take 1,000 mg by mouth daily.    [provider]  Cholecalciferol (VITAMIN D3)  50 MCG (2000 UT) TABS Take by mouth daily at 6 (six) AM.    [provider]  clotrimazole-betamethasone (LOTRISONE) cream Apply 1 Application topically 2 (two) times daily. 05/24/22   PFelipa Furnace DPM  EPINEPHrine (EPIPEN 2-PAK) 0.3 mg/0.3 mL IJ SOAJ injection Inject 0.3 mg into the muscle once as needed for anaphylaxis (allergic reaction). 06/20/21   Kozlow, EDonnamarie Poag MD  fluticasone (FLOVENT HFA) 110 MCG/ACT inhaler 2 puff twice a day for asthma rinse after use 12/08/21   Luking, SElayne Snare MD  GARLIC PO Take by mouth.    [provider]  ketoconazole (NIZORAL) 2 % cream Apply cream to affect area BID 01/03/22   Luking, SElayne Snare MD  methocarbamol (ROBAXIN) 500 MG tablet Take by mouth every 6 (six) hours as needed for muscle spasms. Patient not taking: Reported on 07/04/2022 11/01/20   [provider]  omeprazole (PRILOSEC) 40 MG capsule Take 1 capsule (40 mg total) by mouth daily. Patient not taking: Reported on 07/04/2022 06/20/21   KJiles Prows MD  rivaroxaban (XARELTO) 20 MG TABS tablet TAKE 1 TABLET BY MOUTH EVERY DAY WITH SUPPER 04/23/22   LKathyrn Drown MD  triamcinolone (NASACORT) 55 MCG/ACT AERO nasal inhaler Place 1 spray into the nose daily. 06/20/21   Kozlow, EDonnamarie Poag MD  triamcinolone cream (KENALOG) 0.1 % Apply thin amount bid prn to spots that itch 04/23/22  Kathyrn Drown, MD  TRUE METRIX BLOOD GLUCOSE TEST test strip SMARTSIG:Via Meter 05/26/21   [provider]  TRUEplus Lancets 33G [place]  05/26/21   [provider]  vitamin E 180 MG (400 UNITS) capsule Take 400 Units by mouth daily.    [provider]  Zinc 50 MG CAPS Take by mouth daily at 6 (six) AM.    [provider]      Allergies    Amoxicillin, Aspirin, Other, Hydrocodone, Latex, Oxycodone, and Levaquin [levofloxacin]    Review of Systems   Review of Systems  Physical Exam Updated Vital Signs BP 138/88   Pulse 65   Temp 97.8 F (36.6 C) (Oral)   Resp 18    Ht '5\' 4"'$  (1.626 m)   Wt 88.9 kg   LMP 09/17/2016 (Approximate)   SpO2 98%   BMI 33.64 kg/m  Physical Exam Constitutional:      General: She is not in acute distress. HENT:     Head: Normocephalic and atraumatic.  Eyes:     Conjunctiva/sclera: Conjunctivae normal.     Pupils: Pupils are equal, round, and reactive to light.  Cardiovascular:     Rate and Rhythm: Normal rate and regular rhythm.  Pulmonary:     Effort: Pulmonary effort is normal. No respiratory distress.  Abdominal:     General: There is no distension.     Tenderness: There is no abdominal tenderness.  Skin:    General: Skin is warm and dry.  Neurological:     General: No focal deficit present.     Mental Status: She is alert and oriented to person, place, and time. Mental status is at baseline.     Motor: No weakness.  Psychiatric:        Mood and Affect: Mood normal.        Behavior: Behavior normal.     ED Results / Procedures / Treatments   Labs (all labs ordered are listed, but only abnormal results are displayed) Labs Reviewed  COMPREHENSIVE METABOLIC PANEL - Abnormal; Notable for the following components:      Result Value   Glucose, Bld 102 (*)    Calcium 8.7 (*)    All other components within normal limits  CBC WITH DIFFERENTIAL/PLATELET  LIPASE, BLOOD  URINALYSIS, ROUTINE W REFLEX MICROSCOPIC  PROTIME-INR    EKG EKG Interpretation  Date/Time:  Monday July 09 2022 21:44:56 EST Ventricular Rate:  79 PR Interval:  196 QRS Duration: 91 QT Interval:  383 QTC Calculation: 439 R Axis:   -26 Text Interpretation: Sinus rhythm Left atrial enlargement Borderline left axis deviation Confirmed by Octaviano Glow [phone]) on 07/09/2022 9:54:35 PM  Radiology MR LUMBAR SPINE WO CONTRAST  Result Date: 07/09/2022 CLINICAL DATA:  Low back pain.  Lateral thigh numbness. EXAM: MRI LUMBAR SPINE WITHOUT CONTRAST TECHNIQUE: Multiplanar, multisequence MR imaging of the lumbar spine was performed. No  intravenous contrast was administered. COMPARISON:  Lumbar radiographs 03/22/2021 FINDINGS: Segmentation: Review of chest CT and abdomen CT examinations reveal that there are 11 paired ribs, and 6 lumbar type non-rib-bearing vertebra, the lowest of which has mildly transitional features including a broad left transverse process which pseudo articulates with the sacrum. Given the scenario, the lowest non-rib-bearing vertebra (with transitional characteristics) is labeled as L5, and the T12 ribs are considered atrophic. Alignment: 3 mm grade 1 anterolisthesis at L3-4 and L4-5. Mild levoconvex lumbar scoliosis. No pars defects. Mildly exaggerated lumbar lordosis. Vertebrae: Mild marrow edema at the pseudoarticulation  of the broad left L5 transverse process in the sacrum. 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 Conus medullaris and cauda equina: Conus extends to the L1 level. Conus and cauda equina appear normal. Paraspinal and other soft tissues: Unremarkable Disc levels: T11-12: No impingement. Left paracentral disc protrusion. This level is only included on the parasagittal images. T12-L1: Unremarkable L1-2: Unremarkable L2-3: No impingement.  Mild degenerative facet arthropathy. L3-4: No impingement. Diffuse disc bulge and bilateral degenerative facet arthropathy. L4-5: No impingement.  Diffuse disc bulge noted. L5-S1: No impingement IMPRESSION: 1. Lumbar spondylosis and degenerative disc disease, without observed impingement. 2. Mild marrow edema at the pseudoarticulation of the broad left L5 transverse process in the sacrum. 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 3. Left paracentral disc protrusion at T11-12, without impingement. 4. 3 mm of grade 1 anterolisthesis at L3-4 and L4-5. 5. Mildly exaggerated lumbar lordosis. Electronically Signed   By: Van Clines M.D.   On: 07/09/2022 21:30   CT Angio Chest PE W and/or Wo Contrast  Result Date: 07/09/2022 CLINICAL DATA:  Abdominal pain EXAM: CT  ANGIOGRAPHY CHEST WITH CONTRAST TECHNIQUE: Multidetector CT imaging of the chest was performed using the standard protocol during bolus administration of intravenous contrast. Multiplanar CT image reconstructions and MIPs were obtained to evaluate the vascular anatomy. RADIATION DOSE REDUCTION: This exam was performed according to the departmental dose-optimization program which includes automated exposure control, adjustment of the mA and/or kV according to patient size and/or use of iterative reconstruction technique. CONTRAST:  80 mL OMNIPAQUE IOHEXOL 350 MG/ML SOLN COMPARISON:  11/28/2021 FINDINGS: Cardiovascular: Satisfactory opacification of the pulmonary arteries to the segmental level. No evidence of pulmonary embolism. Normal heart size. No pericardial effusion. No evidence of thoracic aorta aneurysm or dissection. Mediastinum/Nodes: No enlarged mediastinal, hilar, or axillary lymph nodes. Thyroid gland, trachea, and esophagus demonstrate no significant findings. Lungs/Pleura: Lungs are clear. No pleural effusion or pneumothorax. Upper Abdomen: No acute abnormality. Musculoskeletal: No chest wall abnormality. No acute or significant osseous findings. Review of the MIP images confirms the above findings. IMPRESSION: Unremarkable CT angiogram with no PE, dissection or aneurysm. Lungs are clear. Electronically Signed   By: Sammie Bench M.D.   On: 07/09/2022 20:28    Procedures Procedures    Medications Ordered in ED Medications  iohexol (OMNIPAQUE) 350 MG/ML injection 80 mL (80 mLs Intravenous Contrast Given 07/09/22 2005)    ED Course/ Medical Decision Making/ A&P Clinical Course as of 07/09/22 2330  Mon Jul 09, 2022  2045 Temp: 98.3 F (36.8 C) [MT]    Clinical Course User Index [MT] Wyvonnia Dusky, MD                             Medical Decision Making Amount and/or Complexity of Data Reviewed Radiology: ordered. ECG/medicine tests: ordered.  Risk Prescription drug  management.   This patient presents to the ED with concern for near syncope, possible hemoptysis, nosebleeds, and paresthesias of the lower extremity and weakness of the lower extremity. This involves an extensive number of treatment options, and is a complaint that carries with it a high risk of complications and morbidity.  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  She does not have evidence of acute anemia on her labs.  With her reported paresthesias of the lower extremity and weakness with walking, and low back pain, she may also need MRI imaging of her lumbar spine to evaluate for possible cauda equina findings.  She does not have incontinence.  I ordered and personally interpreted labs.  The pertinent results include: No emergent findings  I ordered imaging studies including CT chest, MRI I independently visualized and interpreted imaging which showed no acute PE or acute intrathoracic findings.  Chronic degenerative disc disease of the lower back but no evidence of cauda equina I agree with the radiologist interpretation  The patient was maintained on a cardiac monitor.  I personally viewed and interpreted the cardiac monitored which showed an underlying rhythm of: Sinus rhythm  Per my interpretation the patient's ECG shows no acute ischemic findings or evidence of high-grade arrhythmia   I have reviewed the patients home medicines and have made adjustments as needed  Test Considered: She does not have findings consistent with bowel obstruction or intra-abdominal emergency to warrant CT  imaging of the abdomen at this time   After the interventions noted above, I reevaluated the patient and found that they have: improved  Dispostion:  After consideration of the diagnostic results and the patients response to treatment, I feel that the patent would benefit from outpatient PCP follow-up, as well as orthopedic follow-up with her hip surgeon as she may be experiencing peripheral paresthesia of her lower extremities.         Final Clinical Impression(s) / ED Diagnoses Final diagnoses:  Chest pain, unspecified type  Paresthesias    Rx / DC Orders ED Discharge Orders     None         Wyvonnia Dusky, MD 07/09/22 [phone]

## 2022-07-16 ENCOUNTER — Telehealth: Payer: Self-pay | Admitting: Family Medicine

## 2022-07-16 ENCOUNTER — Other Ambulatory Visit: Payer: Self-pay | Admitting: Family Medicine

## 2022-07-16 DIAGNOSIS — Z1231 Encounter for screening mammogram for malignant neoplasm of breast: Secondary | ICD-10-CM

## 2022-07-16 NOTE — Telephone Encounter (Signed)
Patient is requesting a letter to excuse her from jury duty due to her disabilities, She is needing letter by 07/23/2022 faxed to court house [phone]

## 2022-07-17 ENCOUNTER — Telehealth: Payer: Self-pay

## 2022-07-17 ENCOUNTER — Encounter: Payer: Self-pay | Admitting: Family Medicine

## 2022-07-17 NOTE — Telephone Encounter (Signed)
I did do a letter, please print, I will sign it, give it to the patient thank you

## 2022-07-17 NOTE — Progress Notes (Signed)
Front-please have me sign form-and then go ahead and give to the patient thank you

## 2022-07-17 NOTE — Telephone Encounter (Signed)
        Patient  visited [facility] on 2/26   Telephone encounter attempt :  1st  A HIPAA compliant voice message was left requesting a return call.  Instructed patient to call back.   [facility] [phone] [address] at [place], [place],  [address] Phone: [phone] Email: [email or web address]

## 2022-07-18 ENCOUNTER — Telehealth: Payer: Self-pay

## 2022-07-18 NOTE — Telephone Encounter (Signed)
     Patient  visit on 2/26  at [place]  Patient has not had a follow up visit and cant afford her medication I have sent referral to the pharmacy team for assistance to help get medication     Have you been able to follow up with your primary care physician? No   The patient was or was not able to obtain any needed medicine or equipment. No   Are there diet recommendations that you are having difficulty following? Na   Patient expresses understanding of discharge instructions and education provided has no other needs at this time.  Yes      [facility] [phone] [address], [place], [place] [address] Phone: [phone] Email: [email or web address]

## 2022-07-18 NOTE — Patient Outreach (Signed)
Received a Pharmacy referral for medication assistance.  I have sent a referral to the [place] [place] [place] [facility] [phone]

## 2022-07-19 ENCOUNTER — Telehealth: Payer: Self-pay

## 2022-07-19 NOTE — Progress Notes (Signed)
[place] [facility]) [place]   07/19/2022  Stephanie [name] 08/10/1960 [id number]  Reason for referral: Medication Assistance  Referral source: [facility] RN Current insurance: [employer]  Reason for call: Medication Assistance  Outreach:  Unsuccessful telephone call attempt #1 to patient.   HIPAA compliant voicemail left requesting a return call  Plan:  -I will make another outreach attempt to patient within 3-4 business days.  Thank you for allowing [facility] pharmacy to be a part of this patient's care. Reed Breech, PharmD Clinical Pharmacist  [place] [phone]

## 2022-07-25 ENCOUNTER — Telehealth: Payer: Self-pay

## 2022-07-25 NOTE — Progress Notes (Signed)
[place] [facility]) [place]   07/25/2022  Stephanie [name] 16-Nov-1960 [id number]   Reason for referral: Medication Assistance with Xarelto  Referral source: [facility] RN Current insurance: [employer]  Reason for call: Medication Assistance  Outreach:  Unsuccessful telephone call attempt #2 to patient.   HIPAA compliant voicemail left requesting a return call  Plan:  -I will make another outreach attempt to patient within 3-4 business days.    Thanks,  Reed Breech, [place] [phone]

## 2022-07-27 ENCOUNTER — Inpatient Hospital Stay (HOSPITAL_BASED_OUTPATIENT_CLINIC_OR_DEPARTMENT_OTHER): Payer: Medicare HMO | Admitting: Oncology

## 2022-07-27 ENCOUNTER — Inpatient Hospital Stay: Payer: Medicare HMO | Attending: Oncology

## 2022-07-27 VITALS — BP 135/99 | HR 68 | Temp 98.0°F | Resp 16 | Wt 199.2 lb

## 2022-07-27 DIAGNOSIS — Z7901 Long term (current) use of anticoagulants: Secondary | ICD-10-CM | POA: Diagnosis not present

## 2022-07-27 DIAGNOSIS — Z86718 Personal history of other venous thrombosis and embolism: Secondary | ICD-10-CM | POA: Insufficient documentation

## 2022-07-27 DIAGNOSIS — R040 Epistaxis: Secondary | ICD-10-CM | POA: Insufficient documentation

## 2022-07-27 DIAGNOSIS — Z86711 Personal history of pulmonary embolism: Secondary | ICD-10-CM | POA: Insufficient documentation

## 2022-07-27 DIAGNOSIS — K921 Melena: Secondary | ICD-10-CM | POA: Insufficient documentation

## 2022-07-27 DIAGNOSIS — I82411 Acute embolism and thrombosis of right femoral vein: Secondary | ICD-10-CM

## 2022-07-27 DIAGNOSIS — F418 Other specified anxiety disorders: Secondary | ICD-10-CM | POA: Diagnosis not present

## 2022-07-27 DIAGNOSIS — R7989 Other specified abnormal findings of blood chemistry: Secondary | ICD-10-CM | POA: Diagnosis not present

## 2022-07-27 DIAGNOSIS — Z5181 Encounter for therapeutic drug level monitoring: Secondary | ICD-10-CM

## 2022-07-27 LAB — COMPREHENSIVE METABOLIC PANEL
ALT: 16 U/L (ref 0–44)
AST: 16 U/L (ref 15–41)
Albumin: 4.1 g/dL (ref 3.5–5.0)
Alkaline Phosphatase: 87 U/L (ref 38–126)
Anion gap: 7 (ref 5–15)
BUN: 7 mg/dL (ref 6–20)
CO2: 28 mmol/L (ref 22–32)
Calcium: 9.2 mg/dL (ref 8.9–10.3)
Chloride: 106 mmol/L (ref 98–111)
Creatinine, Ser: 0.55 mg/dL (ref 0.44–1.00)
GFR, Estimated: >60 mL/min (ref >60)
Glucose, Bld: 84 mg/dL (ref 70–99)
Potassium: 3.8 mmol/L (ref 3.5–5.1)
Sodium: 141 mmol/L (ref 135–145)
Total Bilirubin: 0.8 mg/dL (ref 0.3–1.2)
Total Protein: 7.6 g/dL (ref 6.5–8.1)

## 2022-07-27 LAB — CBC WITH DIFFERENTIAL/PLATELET
Abs Immature Granulocytes: 0.01 10*3/uL (ref 0.00–0.07)
Basophils Absolute: 0.1 10*3/uL (ref 0.0–0.1)
Basophils Relative: 1 %
Eosinophils Absolute: 0.4 10*3/uL (ref 0.0–0.5)
Eosinophils Relative: 8 %
HCT: 41.8 % (ref 36.0–46.0)
Hemoglobin: 14.1 g/dL (ref 12.0–15.0)
Immature Granulocytes: 0 %
Lymphocytes Relative: 41 %
Lymphs Abs: 1.9 10*3/uL (ref 0.7–4.0)
MCH: 29.6 pg (ref 26.0–34.0)
MCHC: 33.7 g/dL (ref 30.0–36.0)
MCV: 87.6 fL (ref 80.0–100.0)
Monocytes Absolute: 0.3 10*3/uL (ref 0.1–1.0)
Monocytes Relative: 7 %
Neutro Abs: 2.1 10*3/uL (ref 1.7–7.7)
Neutrophils Relative %: 43 %
Platelets: 257 10*3/uL (ref 150–400)
RBC: 4.77 MIL/uL (ref 3.87–5.11)
RDW: 15.2 % (ref 11.5–15.5)
WBC: 4.7 10*3/uL (ref 4.0–10.5)
nRBC: 0 % (ref 0.0–0.2)

## 2022-07-27 LAB — FERRITIN: Ferritin: 56 ng/mL (ref 11–307)

## 2022-07-27 LAB — D-DIMER, QUANTITATIVE: D-Dimer, Quant: 1.52 ug/mL-FEU — ABNORMAL HIGH (ref 0.00–0.50)

## 2022-07-27 MED ORDER — APIXABAN 2.5 MG PO TABS: 2.5000 mg | ORAL_TABLET | Freq: Two times a day (BID) | ORAL | 3 refills | Status: DC

## 2022-07-27 NOTE — Progress Notes (Unsigned)
[place] [facility] Scheduled follow up Visit:  Patient Care Team: Kathyrn Drown, MD as PCP - General (Family Medicine)  CHIEF COMPLAINTS/PURPOSE OF CONSULTATION: HISTORY OF PRESENTING ILLNESS: Stephanie SHEILDS 62 y.o. female is here because of VTE  May 08 2018:  CT PA negative for PE September 07 2020:  CT PA negative for PE November 28 2021:  Presented to [facility] with right leg pain/swelling and SOB  CT PA Small filling defect in a right upper lobe subsegmental artery suggesting small pulmonary embolism. No evidence of right heart strain. Cardiomegaly. LE U/S showed occlusive thrombus in the right peroneal vein.  Patient was placed on Xarelto and discharged home from the ED.   December 01 2021:  Presented to [facility] with near syncope CT PA negative for PE.    January 19 2022:  [facility] Remains on Xarelto without bleeding problems  Patient is G4 P3013 and delivered by C section without history of VTE.  She has also undergone sequential hip arthroplasties without VTE.   She states that she had a bad fall in April 2023 during the course of which she injured the right knee so severely that she was placed in a knee brace.  She wore the brace for about a month.  In June 2023 patient took a road trip to [facility] without stopping.  Activities have also been limited because she has degenerative spine disease.    No family history of VTE  WBC 5.5 hemoglobin 14.2 platelet count 274; 48 segs 39 lymphs 6 monos 6 eos 1 basophil D dimer 1.43 CMP normal and notable for Cr 0.56  April 20 2022:  Scheduled follow up for management of VTE.  Reviewed results of labs with patient.  Remains on Xarelto 20 mg daily.  November 12th had COVID; had sore throat, headache, diarrhea, weakness, SOB.  Symptoms are improving.  No bleeding issues.  HERDOO2 score 2 .  7.4% risk of major VTE per 100 patient years.  Not low.  Reviewed result with patient.   Will check  labs today and continue Xarelto at full dose because of elevated d dimer and recent COVID infection  WBC 5.2 hemoglobin 13.8 platelet count 263; 57 segs 31 lymphs 8 monos 3 eos 1 basophil D-dimer 1.24 CMP normal  July 09, 2022: Presented to emergency room with several days of numbness across lower abdomen rating to the legs as well as cough productive of some bloody sputum  CT angio of chest negative for PE  July 27, 2022: Scheduled follow-up for management of VTE.  Since last visit has been experiencing abdominal discomfort, itching of back and head which she attributes to Xarelto.  Has experienced mild epistaxis which she found concerning.  States that she had a dark stool on the day of her visit to the ED.  Has not experienced frank BRBPR or melena.  Currently on Xarelto 20 mg daily.    Review of Systems  Constitutional:  Positive for fatigue. Negative for chills, fever and unexpected weight change.       Occasional drenching night sweats.  No hot flashes  HENT:   Negative for mouth sores, nosebleeds and trouble swallowing.   Eyes:  Positive for eye problems. Negative for icterus.       Occasional blurry vision.    Respiratory:  Positive for shortness of breath. Negative for cough and hemoptysis.        Episodes of chest tightness at rest.  Has discussed this with PCP DOE on walking up and down her driveway ([id number]) attributes this to asthma  Cardiovascular:  Positive for chest pain, leg swelling and palpitations.  Gastrointestinal:  Positive for diarrhea. Negative for abdominal pain, blood in stool and constipation.  Genitourinary:  Negative for dysuria, frequency and hematuria.   Musculoskeletal:  Positive for arthralgias, back pain and gait problem.       Myalgias or right leg Arthralgias of right leg  Neurological:  Positive for dizziness, gait problem and numbness.  Hematological:  Negative for adenopathy. Bruises/bleeds easily.  Psychiatric/Behavioral:  Positive for sleep  disturbance. Negative for suicidal ideas. The patient is nervous/anxious.        Doesn't feel good and has SOB which interferes with sleep    MEDICAL HISTORY: Past Medical History:  Diagnosis Date  . Allergic rhinitis   . Allergy   . Arthritis   . Asthma   . Depression   . GERD (gastroesophageal reflux disease)   . Heart murmur   . Hypertension   . Pneumonia    hx of x 3     SURGICAL HISTORY: Past Surgical History:  Procedure Laterality Date  . [place]  . TONSILLECTOMY    . TOTAL HIP ARTHROPLASTY Right 03/27/2017   Procedure: RIGHT TOTAL HIP ARTHROPLASTY ANTERIOR APPROACH;  Surgeon: Gaynelle Arabian, MD;  Location: [facility];  Service: Orthopedics;  Laterality: Right;  . TOTAL HIP ARTHROPLASTY Left 08/17/2020   Procedure: TOTAL HIP ARTHROPLASTY ANTERIOR APPROACH;  Surgeon: Gaynelle Arabian, MD;  Location: [facility];  Service: Orthopedics;  Laterality: Left;  142min    SOCIAL HISTORY: Social History   Socioeconomic History  . Marital status: Divorced    Spouse name: Not on file  . Number of children: 3  . Years of education: Not on file  . Highest education level: Not on file  Occupational History  . Occupation: retired  Tobacco Use  . Smoking status: Former    Packs/day: 0.25    Years: 18.00    Additional pack years: 0.00    Total pack years: 4.50    Types: Cigarettes    Start date: 09/24/1994    Quit date: 03/06/2013    Years since quitting: 9.3  . Smokeless tobacco: Never  Vaping Use  . Vaping Use: Never used  Substance and Sexual Activity  . Alcohol use: Not Currently    Alcohol/week: 2.0 - 3.0 standard drinks of alcohol    Types: 2 - 3 Glasses of wine per week  . Drug use: No  . Sexual activity: Not Currently  Other Topics Concern  . Not on file  Social History Narrative   2 daughters and 1 son, visit frequently per pt.    Social Determinants of Health   Financial Resource Strain: High Risk (03/14/2021)   Overall Financial Resource  Strain (CARDIA)   . Difficulty of Paying Living Expenses: Hard  Food Insecurity: Food Insecurity Present (03/14/2021)   Hunger Vital Sign   . Worried About Charity fundraiser in the Last Year: Sometimes true   . Ran Out of Food in the Last Year: Sometimes true  Transportation Needs: No Transportation Needs (03/14/2021)   PRAPARE - Transportation   . Lack of Transportation (Medical): No   . Lack of Transportation (Non-Medical): No  Physical Activity: Insufficiently Active (03/14/2021)   Exercise Vital Sign   . Days of Exercise per Week: 3 days   . Minutes of Exercise per Session: 10  min  Stress: Stress Concern Present (03/14/2021)   [place]   . Feeling of Stress : Rather much  Social Connections: Moderately Integrated (03/14/2021)   Social Connection and Isolation Panel [NHANES]   . Frequency of Communication with Friends and Family: More than three times a week   . Frequency of Social Gatherings with Friends and Family: More than three times a week   . Attends Religious Services: 1 to 4 times per year   . Active Member of Clubs or Organizations: No   . Attends Archivist Meetings: 1 to 4 times per year   . Marital Status: Divorced  Human resources officer Violence: Not At Risk (03/14/2021)   Humiliation, Afraid, Rape, and Kick questionnaire   . Fear of Current or Ex-Partner: No   . Emotionally Abused: No   . Physically Abused: No   . Sexually Abused: No    FAMILY HISTORY Family History  Problem Relation Age of Onset  . Hypertension Mother   . Hypertension Father   . Stomach cancer Niece   . Breast cancer Niece   . Colon cancer Neg Hx   . Esophageal cancer Neg Hx   . Rectal cancer Neg Hx     ALLERGIES:  is allergic to amoxicillin, aspirin, other, hydrocodone, latex, oxycodone, and levaquin [levofloxacin].  MEDICATIONS:  Current Outpatient Medications  Medication Sig Dispense Refill  . albuterol  (PROVENTIL HFA) 108 (90 Base) MCG/ACT inhaler Inhale 2 puffs into the lungs every 4 (four) hours as needed for wheezing or shortness of breath. 18 g 5  . albuterol (PROVENTIL) (2.5 MG/3ML) 0.083% nebulizer solution TAKE 35ml BY NEBULIZER EVERY 4 HOURS AS NEEDED FOR WHEEZING OR SHORTNESS OF BREATH (Patient taking differently: Take 2.5 mg by nebulization every 4 (four) hours as needed for shortness of breath or wheezing.) 180 mL 1  . amLODipine (NORVASC) 2.5 MG tablet TAKE 1 TABLET BY MOUTH DAILY 90 tablet 1  . amLODipine (NORVASC) 5 MG tablet Take 0.5 tablets (2.5 mg total) by mouth daily. Takes 1 tablet 2.5 mg daily. 30 tablet 5  . arformoterol (BROVANA) 15 MCG/2ML NEBU Take 2 mLs (15 mcg total) by nebulization 2 (two) times daily. 200 mL 3  . Ascorbic Acid (VITAMIN C) 1000 MG tablet Take 1,000 mg by mouth daily.    . Cholecalciferol (VITAMIN D3) 50 MCG (2000 UT) TABS Take by mouth daily at 6 (six) AM.    . clotrimazole-betamethasone (LOTRISONE) cream Apply 1 Application topically 2 (two) times daily. 30 g 0  . EPINEPHrine (EPIPEN 2-PAK) 0.3 mg/0.3 mL IJ SOAJ injection Inject 0.3 mg into the muscle once as needed for anaphylaxis (allergic reaction). 2 each 1  . fluticasone (FLOVENT HFA) 110 MCG/ACT inhaler 2 puff twice a day for asthma rinse after use 1 each 3  . GARLIC PO Take by mouth.    Marland Kitchen ketoconazole (NIZORAL) 2 % cream Apply cream to affect area BID 30 g 1  . methocarbamol (ROBAXIN) 500 MG tablet Take by mouth every 6 (six) hours as needed for muscle spasms. (Patient not taking: Reported on 07/04/2022)    . omeprazole (PRILOSEC) 40 MG capsule Take 1 capsule (40 mg total) by mouth daily. (Patient not taking: Reported on 07/04/2022) 30 capsule 5  . rivaroxaban (XARELTO) 20 MG TABS tablet TAKE 1 TABLET BY MOUTH EVERY DAY WITH SUPPER 30 tablet 5  . triamcinolone (NASACORT) 55 MCG/ACT AERO nasal inhaler Place 1 spray into the nose daily. 16.9  g 5  . triamcinolone cream (KENALOG) 0.1 % Apply thin amount  bid prn to spots that itch 15 g 1  . TRUE METRIX BLOOD GLUCOSE TEST test strip SMARTSIG:Via Meter    . TRUEplus Lancets 33G MISC     . vitamin E 180 MG (400 UNITS) capsule Take 400 Units by mouth daily.    . Zinc 50 MG CAPS Take by mouth daily at 6 (six) AM.     Current Facility-Administered Medications  Medication Dose Route Frequency Provider Last Rate Last Admin  . Benralizumab SOSY 30 mg  30 mg Subcutaneous Q28 days Valentina Shaggy, MD   30 mg at 10/18/20 1542    PHYSICAL EXAMINATION:  ECOG PERFORMANCE STATUS: 0 - Asymptomatic   There were no vitals filed for this visit.   There were no vitals filed for this visit.    Physical Exam Vitals and nursing note reviewed.  Constitutional:      General: She is not in acute distress.    Appearance: Normal appearance. She is obese. She is not ill-appearing, toxic-appearing or diaphoretic.     Comments: Here with sister  HENT:     Head: Normocephalic and atraumatic.     Right Ear: External ear normal.     Left Ear: External ear normal.     Nose: Nose normal. No congestion or rhinorrhea.  Eyes:     General: No scleral icterus.    Extraocular Movements: Extraocular movements intact.     Conjunctiva/sclera: Conjunctivae normal.     Pupils: Pupils are equal, round, and reactive to light.  Cardiovascular:     Rate and Rhythm: Normal rate.     Heart sounds: No murmur heard.    No friction rub. No gallop.  Pulmonary:     Effort: Pulmonary effort is normal. No respiratory distress.     Breath sounds: Normal breath sounds. No stridor. No wheezing or rhonchi.  Abdominal:     General: Bowel sounds are normal. There is no distension.     Palpations: Abdomen is soft. There is no mass.     Tenderness: There is no abdominal tenderness. There is no guarding or rebound.     Hernia: No hernia is present.  Musculoskeletal:        General: No swelling, tenderness or deformity.     Cervical back: Normal range of motion and neck supple.  No rigidity or tenderness.     Right lower leg: No edema.     Left lower leg: No edema.  Lymphadenopathy:     Head:     Right side of head: No submental, submandibular, tonsillar, preauricular, posterior auricular or occipital adenopathy.     Left side of head: No submental, submandibular, tonsillar, preauricular, posterior auricular or occipital adenopathy.     Cervical: No cervical adenopathy.     Right cervical: No superficial, deep or posterior cervical adenopathy.    Left cervical: No superficial, deep or posterior cervical adenopathy.     Upper Body:     Right upper body: No supraclavicular, axillary, pectoral or epitrochlear adenopathy.     Left upper body: No supraclavicular, axillary, pectoral or epitrochlear adenopathy.  Skin:    General: Skin is warm.     Coloration: Skin is not jaundiced or pale.     Findings: No erythema, lesion or rash.  Neurological:     General: No focal deficit present.     Mental Status: She is alert and oriented to person, place, and time. Mental  status is at baseline.     Cranial Nerves: No cranial nerve deficit.  Psychiatric:        Mood and Affect: Mood normal.        Behavior: Behavior normal.        Thought Content: Thought content normal.        Judgment: Judgment normal.     LABORATORY DATA: I have personally reviewed the data as listed:  Admission on 07/09/2022, Discharged on 07/09/2022  Component Date Value Ref Range Status  . WBC 07/09/2022 7.4  4.0 - 10.5 K/uL Final  . RBC 07/09/2022 4.51  3.87 - 5.11 MIL/uL Final  . Hemoglobin 07/09/2022 13.1  12.0 - 15.0 g/dL Final  . HCT 07/09/2022 40.6  36.0 - 46.0 % Final  . MCV 07/09/2022 90.0  80.0 - 100.0 fL Final  . MCH 07/09/2022 29.0  26.0 - 34.0 pg Final  . MCHC 07/09/2022 32.3  30.0 - 36.0 g/dL Final  . RDW 07/09/2022 15.5  11.5 - 15.5 % Final  . Platelets 07/09/2022 253  150 - 400 K/uL Final  . nRBC 07/09/2022 0.0  0.0 - 0.2 % Final  . Neutrophils Relative % 07/09/2022 62  %  Final  . Neutro Abs 07/09/2022 4.6  1.7 - 7.7 K/uL Final  . Lymphocytes Relative 07/09/2022 25  % Final  . Lymphs Abs 07/09/2022 1.9  0.7 - 4.0 K/uL Final  . Monocytes Relative 07/09/2022 7  % Final  . Monocytes Absolute 07/09/2022 0.5  0.1 - 1.0 K/uL Final  . Eosinophils Relative 07/09/2022 5  % Final  . Eosinophils Absolute 07/09/2022 0.4  0.0 - 0.5 K/uL Final  . Basophils Relative 07/09/2022 1  % Final  . Basophils Absolute 07/09/2022 0.1  0.0 - 0.1 K/uL Final  . Immature Granulocytes 07/09/2022 0  % Final  . Abs Immature Granulocytes 07/09/2022 0.01  0.00 - 0.07 K/uL Final   Performed at [facility], [place] [address], [place],  [place] [address]  . Sodium 07/09/2022 137  135 - 145 mmol/L Final  . Potassium 07/09/2022 3.6  3.5 - 5.1 mmol/L Final  . Chloride 07/09/2022 105  98 - 111 mmol/L Final  . CO2 07/09/2022 26  22 - 32 mmol/L Final  . Glucose, Bld 07/09/2022 102 (H)  70 - 99 mg/dL Final   Glucose reference range applies only to samples taken after fasting for at least 8 hours.  . BUN 07/09/2022 13  6 - 20 mg/dL Final  . Creatinine, Ser 07/09/2022 0.44  0.44 - 1.00 mg/dL Final  . Calcium 07/09/2022 8.7 (L)  8.9 - 10.3 mg/dL Final  . Total Protein 07/09/2022 7.4  6.5 - 8.1 g/dL Final  . Albumin 07/09/2022 3.8  3.5 - 5.0 g/dL Final  . AST 07/09/2022 21  15 - 41 U/L Final  . ALT 07/09/2022 16  0 - 44 U/L Final  . Alkaline Phosphatase 07/09/2022 81  38 - 126 U/L Final  . Total Bilirubin 07/09/2022 0.9  0.3 - 1.2 mg/dL Final  . GFR, Estimated 07/09/2022 >60  >60 mL/min Final   Comment: (NOTE) Calculated using the CKD-EPI Creatinine Equation (2021)   . Anion gap 07/09/2022 6  5 - 15 Final   Performed at [facility], [place] [address] [place], [place] [address]  . Lipase 07/09/2022 39  11 - 51 U/L Final   Performed at [facility], [place] [address] [place], [place] [address]  . Color, Urine 07/09/2022 YELLOW  YELLOW Final  .  APPearance 07/09/2022 CLEAR  CLEAR Final  . Specific Gravity, Urine 07/09/2022 1.013  1.005 - 1.030 Final  . pH 07/09/2022 6.0  5.0 - 8.0 Final  . Glucose, UA 07/09/2022 NEGATIVE  NEGATIVE mg/dL Final  . Hgb urine dipstick 07/09/2022 NEGATIVE  NEGATIVE Final  . Bilirubin Urine 07/09/2022 NEGATIVE  NEGATIVE Final  . Ketones, ur 07/09/2022 NEGATIVE  NEGATIVE mg/dL Final  . Protein, ur 07/09/2022 NEGATIVE  NEGATIVE mg/dL Final  . Nitrite 07/09/2022 NEGATIVE  NEGATIVE Final  . Chalmers Guest 07/09/2022 NEGATIVE  NEGATIVE Final   Performed at [place] [address], [place], [place] [address]  . Prothrombin Time 07/09/2022 13.6  11.4 - 15.2 seconds Final  . INR 07/09/2022 1.1  0.8 - 1.2 Final   Comment: (NOTE) INR goal varies based on device and disease states. Performed at [facility], [place] [address], [place], [place] [address]     RADIOGRAPHIC STUDIES: I have personally reviewed the radiological images as listed and agree with the findings in the report  No results found.  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  VTE diagnosed in July 2023:  Patient presented to ED with SOB as well as pain and swelling in distal RLE.  CT PA showed small filling defect in a right upper lobe subsegmental artery suggesting small pulmonary embolism. LE U/S showed occlusive thrombus in the right peroneal vein.  Patient was placed on Xarelto in the ED and appropriately discharged home.    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  These have been in the form of surgery and childbirth.  The time frame of RLE injury makes it unlikely to be a major contributing factor.  The road trip to [facility] is also not likely a major factory.  Her main risk factor is likely obesity.  It is reasonable to classify this this as  an unprovoked event.  Asthma is not considered a risk factor for VTE  Duration of anticoagulation:  HERDOO2 score 2 .  7.4% risk of major VTE per 100 patient years.  Not low.  Continuation of anticoagulation recommended per that model.  In addition patient recently had covid which is a known risk factor for VTE's.       Cancer Staging  No matching staging information was found for the patient.   No problem-specific Assessment & Plan notes found for this encounter.   No orders of the defined types were placed in this encounter.   All questions were answered. The patient knows to call the clinic with any problems, questions or concerns.  30 minutes spent in face to face contact with patient.    This note was electronically signed.    Barbee Cough, MD  07/27/2022 9:01 AM

## 2022-07-30 ENCOUNTER — Telehealth: Payer: Self-pay

## 2022-07-30 NOTE — Progress Notes (Signed)
[facility])  [place] Team   07/30/2022  Stephanie [name] 01-21-1961 [id number]  Reason for referral: Medication assistance with Eliquis  Referral source: [facility] RN Current insurance: [employer]  Outreach:  Successful telephone call with 1.  HIPAA identifiers verified. Patient reports financial barriers with DOAC, Epi-pen, and inhaler. I screened her for patient assistance to see which programs she qualifies for.   Medication Review Findings:  The patient meets the eligibility requirements for patient assistance for Eliquis. The patient may qualify for extra-help from Medicare.    Extra Help:  May be eligible for Medicare Extra Help Low Income Subsidy based on reported income and assets   Plan: Will follow-up in 2 days to apply for LIS and Eliquis patient assistance.  Thank you for allowing [facility] pharmacy to be a part of this patient's care.   Reed Breech, PharmD Clinical Pharmacist  [name] [phone]

## 2022-08-01 DIAGNOSIS — R4589 Other symptoms and signs involving emotional state: Secondary | ICD-10-CM | POA: Insufficient documentation

## 2022-08-01 DIAGNOSIS — F418 Other specified anxiety disorders: Secondary | ICD-10-CM | POA: Insufficient documentation

## 2022-08-01 DIAGNOSIS — R7989 Other specified abnormal findings of blood chemistry: Secondary | ICD-10-CM | POA: Insufficient documentation

## 2022-08-03 ENCOUNTER — Telehealth: Payer: Self-pay

## 2022-08-03 NOTE — Progress Notes (Signed)
[facility])  [place] Team   08/03/2022  Stephanie [name] 02/07/1961 [id number]  Reason for referral: Medication assistance with Eliquis  Referral source: [facility] RN Current insurance: [employer]  Outreach:  Successful telephone call with [name].  HIPAA identifiers verified. On the call, we completed the Medicare Extra-Help (LIS) application as well as outreached the [place] savings program. The patient does not qualify for the [place] savings program.   Plan: Will contact patient's pharmacy regarding her out of pocket expense to see if she qualifies for Eliquis PAP. Typical follow-up time for Medicare Extra-Help application status update is 6-8 weeks. The patient has a 30-day supply of the medication at this time.  Thank you for allowing [facility] pharmacy to be a part of this patient's care.  Reed Breech, PharmD Clinical Pharmacist  [place] [phone]

## 2022-08-16 NOTE — Progress Notes (Signed)
[facility])  [place] Team   08/16/2022  Stephanie [name] [birth year] [id number]  Reason for referral: Medication assistance with Eliquis  Referral source: [facility] RN Current insurance: [employer]  Outreach:  Successful telephone call with [name].  HIPAA identifiers verified. I have followed up with the patient's pharmacy, she has not yet met the designated out-of-pocket (OOP) expense for the Eliquis PAP. The patient has upcoming appointments with her PCP and Oncologist. She will request samples until we hear back from Medicare Extra-Help.  Plan: Patient will request samples from providers to cover her until we have a status update from [facility] on her Medicare Extra-Help application.   Thank you for allowing [facility] pharmacy to be a part of this patient's care.  Reed Breech, PharmD Clinical Pharmacist  [place] [phone]

## 2022-08-23 ENCOUNTER — Ambulatory Visit (INDEPENDENT_AMBULATORY_CARE_PROVIDER_SITE_OTHER): Payer: Medicare HMO | Admitting: Family Medicine

## 2022-08-23 VITALS — BP 136/88 | HR 74 | Ht 64.0 in | Wt 200.2 lb

## 2022-08-23 DIAGNOSIS — U099 Post covid-19 condition, unspecified: Secondary | ICD-10-CM

## 2022-08-23 DIAGNOSIS — F324 Major depressive disorder, single episode, in partial remission: Secondary | ICD-10-CM

## 2022-08-23 DIAGNOSIS — R0683 Snoring: Secondary | ICD-10-CM

## 2022-08-23 DIAGNOSIS — J4550 Severe persistent asthma, uncomplicated: Secondary | ICD-10-CM | POA: Diagnosis not present

## 2022-08-23 DIAGNOSIS — M5431 Sciatica, right side: Secondary | ICD-10-CM | POA: Diagnosis not present

## 2022-08-23 DIAGNOSIS — R519 Headache, unspecified: Secondary | ICD-10-CM | POA: Diagnosis not present

## 2022-08-23 DIAGNOSIS — I10 Essential (primary) hypertension: Secondary | ICD-10-CM | POA: Diagnosis not present

## 2022-08-23 DIAGNOSIS — I2693 Single subsegmental pulmonary embolism without acute cor pulmonale: Secondary | ICD-10-CM

## 2022-08-23 MED ORDER — PREGABALIN 25 MG PO CAPS: 25.0000 mg | ORAL_CAPSULE | Freq: Two times a day (BID) | ORAL | 2 refills | Status: DC

## 2022-08-23 MED ORDER — FLUTICASONE-SALMETEROL 100-50 MCG/ACT IN AEPB: 1.0000 | INHALATION_SPRAY | Freq: Two times a day (BID) | RESPIRATORY_TRACT | 3 refills | Status: DC

## 2022-08-23 NOTE — Patient Instructions (Signed)
We will be setting you up for an MRI of the brain When we get the results we will be letting you know  We will also help set you up with a sleep specialist with [facility] they will be doing a sleep apnea test  Also stop Flovent steroid inhaler, start Advair 1 puff twice daily rinse mouth with water after use May use albuterol as needed  As for Lyrica 1 taken twice daily the first 4 days I would recommend just 1 at nighttime and then after that 1 twice daily follow-up here in 8 weeks feel free to send [place] update within 3 weeks on how you are doing

## 2022-08-23 NOTE — Progress Notes (Signed)
   Subjective:    Patient ID: Stephanie [name], female    DOB: 1960/07/02, 62 y.o.   MRN: [id number]  HPI Patient arrives today for 4 month follow up.   Patient states she is still having issues with headaches, dizziness, and joint pain.  Patient states she is having numbness and swelling in both legs. Long COVID  Snoring - Plan: Ambulatory referral to Neurology  Right sided sciatica  Essential hypertension  Frequent headaches - Plan: MR Brain W Wo Contrast  Severe persistent asthma, uncomplicated  Single subsegmental pulmonary embolism without acute cor pulmonale  Depression, major, single episode, in partial remission  She does have sleep apnea symptoms fatigue tiredness during the day snores at night sleepy during the day but not with driving does minimal driving Patient for blood pressure check up.  The patient does have hypertension.   Patient relates dietary measures try to minimize salt The importance of healthy diet and activity were discussed Patient relates compliance She does has asthma she uses her inhalers states overall doing well with ferrous Frequent headaches more so at nighttime have woken up in the melanite with nausea no double vision or blurred vision Severe low back pain and discomfort with radiation into the legs worse with certain movements   Review of Systems     Objective:   Physical Exam General-in no acute distress Eyes-no discharge Lungs-respiratory rate normal, CTA CV-no murmurs,RRR Extremities skin warm dry no edema Neuro grossly normal Behavior normal, alert Subjective discomfort in her back and into her legs       Assessment & Plan:  1. Long COVID Patient has difficulty thinking and processing feels a lot of mental fatigue  In addition to this significant shortness of breath with activity she is already on blood thinner - MR Brain W Wo Contrast  2. Snoring Has sleep apnea symptoms with snoring daytime fatigue tiredness  sleepiness consistent with possible sleep apnea - Ambulatory referral to Neurology  3. Right sided sciatica Patient willing to try Lyrica does not want to go back on opioids which I agree with if side effects notify us follow-up several weeks  4. Essential hypertension Blood pressure good control  5. Frequent headaches Nocturnal headaches present over the past 3 months could be related to sleep apnea but because of the severity of the headache along with vomiting with the headache need to rule out tumor or hydrocephalus go forward with MRI Asthma stable continue inhalers make some slight adjustments and how to use these Pulmonary emboli continue anticoagulant whether or not to come off this will be up to her and the oncology hematology Partial remission of depression hopefully Lyrica will help Lyrica will also help with chronic back pain and discomfort into the legs Follow-up 8 weeks

## 2022-08-27 ENCOUNTER — Telehealth: Payer: Self-pay | Admitting: Gastroenterology

## 2022-08-27 NOTE — Telephone Encounter (Signed)
Advised patient not to take Eliquis on 4/16 and 4/17 two days prior to procedure per Dr. Barron Alvine. Urgent Medical clearance request sent to Dr. Angelene Giovanni.  Dr. Angelene Giovanni, this mutual patient of ours just let us know today that she has been taking Eliquis. Will you please send [place] medical clearance holding Eliquis 2 days prior to her colonoscopy which I scheduled on 08/30/22. We apologize for short notice. Thank you!   [facility] Pre-operative Risk Assessment     Request for surgical clearance:     Endoscopy Procedure  What type of surgery is being performed?     Colonoscopy  When is this surgery scheduled?     08/30/22  What type of clearance is required ?   Pharmacy  Are there any medications that need to be held prior to surgery and how long? Eliquis  Practice name and name of physician performing surgery?  [facility]  What is your office phone and fax number?      Phone- [phone]  Fax- [phone]  Anesthesia type (None, local, MAC, general) ?       MAC

## 2022-08-27 NOTE — Telephone Encounter (Signed)
Patient called said she had some medication changes since her last visit. Her doctors has her on Lyrica and changed the Xarelto to Eliquis. Please advise.

## 2022-08-27 NOTE — Telephone Encounter (Signed)
This patient is having colon on 08/30/22. We didn't send medical clearance for Xarelto when she had OV on 07/04/22. Now xarelto is changed to Eliquis. Please advise if we need to change her procedure date and we have enough time to obtain medical clearance. Please advise.

## 2022-08-28 ENCOUNTER — Ambulatory Visit: Payer: Medicare HMO

## 2022-08-28 ENCOUNTER — Other Ambulatory Visit: Payer: Self-pay

## 2022-08-28 NOTE — Telephone Encounter (Signed)
Patient called back. Patient made aware holding Eliquis 2 days prior her colonoscopy.

## 2022-08-28 NOTE — Telephone Encounter (Signed)
Sent MyChart message to Patient regarding Eliquis holding.

## 2022-08-28 NOTE — Telephone Encounter (Signed)
Patient called back. Patient made aware.

## 2022-08-28 NOTE — Telephone Encounter (Signed)
Received clearance from Dr. Angelene Giovanni. Left detailed message to patient to hold Eliquis today and tomorrow 2 days prior to procedure. Patient was already advised holdin Eliquis yesterday via phone. Will continue effort today again.

## 2022-08-28 NOTE — Telephone Encounter (Signed)
Left Vm again to return the call.

## 2022-08-30 ENCOUNTER — Other Ambulatory Visit: Payer: Self-pay | Admitting: Oncology

## 2022-08-30 ENCOUNTER — Ambulatory Visit (AMBULATORY_SURGERY_CENTER): Payer: Medicare HMO | Admitting: Gastroenterology

## 2022-08-30 ENCOUNTER — Encounter: Payer: Self-pay | Admitting: Gastroenterology

## 2022-08-30 VITALS — BP 102/50 | HR 66 | Temp 98.4°F | Resp 15 | Ht 64.0 in | Wt 196.0 lb

## 2022-08-30 DIAGNOSIS — Z1211 Encounter for screening for malignant neoplasm of colon: Secondary | ICD-10-CM | POA: Diagnosis not present

## 2022-08-30 DIAGNOSIS — Z5181 Encounter for therapeutic drug level monitoring: Secondary | ICD-10-CM

## 2022-08-30 DIAGNOSIS — K5730 Diverticulosis of large intestine without perforation or abscess without bleeding: Secondary | ICD-10-CM

## 2022-08-30 DIAGNOSIS — I10 Essential (primary) hypertension: Secondary | ICD-10-CM | POA: Diagnosis not present

## 2022-08-30 MED ORDER — SODIUM CHLORIDE 0.9 % IV SOLN: 500.0000 mL | Freq: Once | INTRAVENOUS | Status: DC

## 2022-08-30 NOTE — Op Note (Signed)
[facility] Patient Name: Stephanie [name] Procedure Date: 08/30/2022 10:11 AM MRN: [id number] Endoscopist: Doristine Locks , MD, [id number] Age: 62 Referring MD:  Date of Birth: 03-Sep-1960 Gender: Female Account #: [id number] Procedure:                Colonoscopy Indications:              Screening for colorectal malignant neoplasm, This                            is the patient's first colonoscopy Medicines:                Monitored Anesthesia Care Procedure:                Pre-Anesthesia Assessment:                           - Prior to the procedure, a History and Physical                            was performed, and patient medications and                            allergies were reviewed. The patient's tolerance of                            previous anesthesia was also reviewed. The risks                            and benefits of the procedure and the sedation                            options and risks were discussed with the patient.                            All questions were answered, and informed consent                            was obtained. Prior Anticoagulants: The patient has                            taken Eliquis (apixaban), last dose was 2 days                            prior to procedure. ASA Grade Assessment: III - A                            patient with severe systemic disease. After                            reviewing the risks and benefits, the patient was                            deemed in satisfactory condition to undergo the  procedure.                           After obtaining informed consent, the colonoscope                            was passed under direct vision. Throughout the                            procedure, the patient's blood pressure, pulse, and                            oxygen saturations were monitored continuously. The                            Olympus CF-HQ190L [id number]) Colonoscope was                             introduced through the anus and advanced to the the                            terminal ileum. The colonoscopy was performed                            without difficulty. The patient tolerated the                            procedure well. The quality of the bowel                            preparation was good. The terminal ileum, ileocecal                            valve, appendiceal orifice, and rectum were                            photographed. Scope In: 10:18:18 AM Scope Out: 10:32:43 AM Scope Withdrawal Time: 0 hours 12 minutes 17 seconds  Total Procedure Duration: 0 hours 14 minutes 25 seconds  Findings:                 The perianal and digital rectal examinations were                            normal.                           Multiple medium-mouthed and small-mouthed                            diverticula were found in the sigmoid colon and                            descending colon.                           The exam  was otherwise normal throughout the                            remainder of the colon.                           The retroflexed view of the distal rectum and anal                            verge was normal and showed no anal or rectal                            abnormalities.                           The terminal ileum appeared normal. Complications:            No immediate complications. Estimated Blood Loss:     Estimated blood loss: none. Impression:               - Diverticulosis in the sigmoid colon and in the                            descending colon.                           - The distal rectum and anal verge are normal on                            retroflexion view.                           - The examined portion of the ileum was normal.                           - No specimens collected.                           - The GI Genius (intelligent endoscopy module),                            computer-aided polyp  detection system powered by AI                            was utilized to detect colorectal polyps through                            enhanced visualization during colonoscopy. Recommendation:           - Patient has a contact number available for                            emergencies. The signs and symptoms of potential                            delayed complications were discussed with the  patient. Return to normal activities tomorrow.                            Written discharge instructions were provided to the                            patient.                           - Resume previous diet.                           - Continue present medications.                           - Resume Eliquis (apixaban) at prior dose today.                           - Repeat colonoscopy in 10 years for screening                            purposes.                           - Return to GI clinic PRN. Doristine Locks, MD 08/30/2022 10:37:31 AM

## 2022-08-30 NOTE — Addendum Note (Signed)
Addended by: Dallie Piles on: 08/30/2022 11:41 AM   Modules accepted: Orders

## 2022-08-30 NOTE — Patient Instructions (Addendum)
-   Resume previous diet. - Continue present medications. - Resume Eliquis (apixaban) at prior dose today. - Repeat colonoscopy in 10 years for screening purposes. - Return to GI clinic PRN.  YOU HAD AN ENDOSCOPIC PROCEDURE TODAY AT THE [facility]:   Refer to the procedure report that was given to you for any specific questions about what was found during the examination.  If the procedure report does not answer your questions, please call your gastroenterologist to clarify.  If you requested that your care partner not be given the details of your procedure findings, then the procedure report has been included in a sealed envelope for you to review at your convenience later.  YOU SHOULD EXPECT: Some feelings of bloating in the abdomen. Passage of more gas than usual.  Walking can help get rid of the air that was put into your GI tract during the procedure and reduce the bloating. If you had a lower endoscopy (such as a colonoscopy or flexible sigmoidoscopy) you may notice spotting of blood in your stool or on the toilet paper. If you underwent a bowel prep for your procedure, you may not have a normal bowel movement for a few days.  Please Note:  You might notice some irritation and congestion in your nose or some drainage.  This is from the oxygen used during your procedure.  There is no need for concern and it should clear up in a day or so.  SYMPTOMS TO REPORT IMMEDIATELY:  Following lower endoscopy (colonoscopy or flexible sigmoidoscopy):  Excessive amounts of blood in the stool  Significant tenderness or worsening of abdominal pains  Swelling of the abdomen that is new, acute  Fever of 100F or higher  For urgent or emergent issues, a gastroenterologist can be reached at any hour by calling [phone]. Do not use MyChart messaging for urgent concerns.    DIET:  We do recommend a small meal at first, but then you may proceed to your regular diet.  Drink plenty of fluids  but you should avoid alcoholic beverages for 24 hours.  ACTIVITY:  You should plan to take it easy for the rest of today and you should NOT DRIVE or use heavy machinery until tomorrow (because of the sedation medicines used during the test).    FOLLOW UP: Our staff will call the number listed on your records the next business day following your procedure.  We will call around 7:15- 8:00 am to check on you and address any questions or concerns that you may have regarding the information given to you following your procedure. If we do not reach you, we will leave a message.     If any biopsies were taken you will be contacted by phone or by letter within the next 1-3 weeks.  Please call us at [phone] if you have not heard about the biopsies in 3 weeks.    SIGNATURES/CONFIDENTIALITY: You and/or your care partner have signed paperwork which will be entered into your electronic medical record.  These signatures attest to the fact that that the information above on your After Visit Summary has been reviewed and is understood.  Full responsibility of the confidentiality of this discharge information lies with you and/or your care-partner.

## 2022-08-30 NOTE — Progress Notes (Signed)
GASTROENTEROLOGY PROCEDURE H&P NOTE   Primary Care Physician: Babs Sciara, MD    Reason for Procedure:  Colon Cancer screening  Plan:    Colonoscopy  Patient is appropriate for endoscopic procedure(s) in the ambulatory ([facility]) setting.  The nature of the procedure, as well as the risks, benefits, and alternatives were carefully and thoroughly reviewed with the patient. Ample time for discussion and questions allowed. The patient understood, was satisfied, and agreed to proceed.     HPI: Stephanie [name] is a 62 y.o. female who presents for colonoscopy for routine Colon Cancer screening.  No active GI symptoms.  No known family history of colon cancer or related malignancy.  Patient is otherwise without complaints or active issues today.  Has been holding Eliquis x2 days for procedure today.   Past Medical History:  Diagnosis Date   Allergic rhinitis    Allergy    Arthritis    Asthma    Depression    DVT (deep venous thrombosis)    August 2023   GERD (gastroesophageal reflux disease)    Heart murmur    Hypertension    Pneumonia    hx of x 3     Past Surgical History:  Procedure Laterality Date   CESAREAN SECTION  1985, 1989, 1995   TONSILLECTOMY     TOTAL HIP ARTHROPLASTY Right 03/27/2017   Procedure: RIGHT TOTAL HIP ARTHROPLASTY ANTERIOR APPROACH;  Surgeon: Ollen Gross, MD;  Location: [facility];  Service: Orthopedics;  Laterality: Right;   TOTAL HIP ARTHROPLASTY Left 08/17/2020   Procedure: TOTAL HIP ARTHROPLASTY ANTERIOR APPROACH;  Surgeon: Ollen Gross, MD;  Location: [facility];  Service: Orthopedics;  Laterality: Left;     Prior to Admission medications   Medication Sig Start Date End Date Taking? Authorizing Provider  albuterol (PROVENTIL HFA) 108 (90 Base) MCG/ACT inhaler Inhale 2 puffs into the lungs every 4 (four) hours as needed for wheezing or shortness of breath. 06/20/21  Yes Kozlow, Alvira Philips, MD  albuterol (PROVENTIL) (2.5 MG/3ML) 0.083% nebulizer  solution TAKE 3ml BY NEBULIZER EVERY 4 HOURS AS NEEDED FOR WHEEZING OR SHORTNESS OF BREATH Patient taking differently: Take 2.5 mg by nebulization every 4 (four) hours as needed for shortness of breath or wheezing. 08/23/21  Yes Kozlow, Alvira Philips, MD  amLODipine (NORVASC) 5 MG tablet Take 0.5 tablets (2.5 mg total) by mouth daily. Takes 1 tablet 2.5 mg daily. 04/23/22  Yes Babs Sciara, MD  Ascorbic Acid (VITAMIN C) 1000 MG tablet Take 1,000 mg by mouth daily.   Yes [provider]  Cholecalciferol (VITAMIN D3) 50 MCG (2000 UT) TABS Take by mouth daily at 6 (six) AM.   Yes [provider]  clotrimazole-betamethasone (LOTRISONE) cream Apply 1 Application topically 2 (two) times daily. 05/24/22  Yes Candelaria Stagers, DPM  GARLIC PO Take by mouth.   Yes [provider]  ketoconazole (NIZORAL) 2 % cream Apply cream to affect area BID 01/03/22  Yes Luking, Scott A, MD  omeprazole (PRILOSEC) 40 MG capsule Take 1 capsule (40 mg total) by mouth daily. 06/20/21  Yes Kozlow, Alvira Philips, MD  pregabalin (LYRICA) 25 MG capsule Take 1 capsule (25 mg total) by mouth 2 (two) times daily. 08/23/22  Yes Luking, Jonna Coup, MD  triamcinolone (NASACORT) 55 MCG/ACT AERO nasal inhaler Place 1 spray into the nose daily. 06/20/21  Yes Kozlow, Alvira Philips, MD  vitamin E 180 MG (400 UNITS) capsule Take 400 Units by mouth daily.  Yes [provider]  Zinc 50 MG CAPS Take by mouth daily at 6 (six) AM.   Yes [provider]  apixaban (ELIQUIS) 2.5 MG TABS tablet Take 1 tablet (2.5 mg total) by mouth 2 (two) times daily. 07/27/22 11/24/22  Loni Muse, MD  EPINEPHrine (EPIPEN 2-PAK) 0.3 mg/0.3 mL IJ SOAJ injection Inject 0.3 mg into the muscle once as needed for anaphylaxis (allergic reaction). Patient not taking: Reported on 08/30/2022 06/20/21   Jessica Priest, MD  fluticasone-salmeterol (ADVAIR) 100-50 MCG/ACT AEPB Inhale 1 puff into the lungs 2 (two) times daily. 08/23/22   Babs Sciara, MD   methocarbamol (ROBAXIN) 500 MG tablet Take by mouth every 6 (six) hours as needed for muscle spasms. 11/01/20   [provider]  triamcinolone cream (KENALOG) 0.1 % Apply thin amount bid prn to spots that itch 04/23/22   Babs Sciara, MD  TRUE METRIX BLOOD GLUCOSE TEST test strip SMARTSIG:Via Meter 05/26/21   [provider]  TRUEplus Lancets 33G MISC  05/26/21   [provider]    Current Outpatient Medications  Medication Sig Dispense Refill   albuterol (PROVENTIL HFA) 108 (90 Base) MCG/ACT inhaler Inhale 2 puffs into the lungs every 4 (four) hours as needed for wheezing or shortness of breath. 18 g 5   albuterol (PROVENTIL) (2.5 MG/3ML) 0.083% nebulizer solution TAKE 3ml BY NEBULIZER EVERY 4 HOURS AS NEEDED FOR WHEEZING OR SHORTNESS OF BREATH (Patient taking differently: Take 2.5 mg by nebulization every 4 (four) hours as needed for shortness of breath or wheezing.) 180 mL 1   amLODipine (NORVASC) 5 MG tablet Take 0.5 tablets (2.5 mg total) by mouth daily. Takes 1 tablet 2.5 mg daily. 30 tablet 5   Ascorbic Acid (VITAMIN C) 1000 MG tablet Take 1,000 mg by mouth daily.     Cholecalciferol (VITAMIN D3) 50 MCG (2000 UT) TABS Take by mouth daily at 6 (six) AM.     clotrimazole-betamethasone (LOTRISONE) cream Apply 1 Application topically 2 (two) times daily. 30 g 0   GARLIC PO Take by mouth.     ketoconazole (NIZORAL) 2 % cream Apply cream to affect area BID 30 g 1   omeprazole (PRILOSEC) 40 MG capsule Take 1 capsule (40 mg total) by mouth daily. 30 capsule 5   pregabalin (LYRICA) 25 MG capsule Take 1 capsule (25 mg total) by mouth 2 (two) times daily. 60 capsule 2   triamcinolone (NASACORT) 55 MCG/ACT AERO nasal inhaler Place 1 spray into the nose daily. 16.9 g 5   vitamin E 180 MG (400 UNITS) capsule Take 400 Units by mouth daily.     Zinc 50 MG CAPS Take by mouth daily at 6 (six) AM.     apixaban (ELIQUIS) 2.5 MG TABS tablet Take 1 tablet (2.5 mg total) by mouth 2  (two) times daily. 60 tablet 3   EPINEPHrine (EPIPEN 2-PAK) 0.3 mg/0.3 mL IJ SOAJ injection Inject 0.3 mg into the muscle once as needed for anaphylaxis (allergic reaction). (Patient not taking: Reported on 08/30/2022) 2 each 1   fluticasone-salmeterol (ADVAIR) 100-50 MCG/ACT AEPB Inhale 1 puff into the lungs 2 (two) times daily. 1 each 3   methocarbamol (ROBAXIN) 500 MG tablet Take by mouth every 6 (six) hours as needed for muscle spasms.     triamcinolone cream (KENALOG) 0.1 % Apply thin amount bid prn to spots that itch 15 g 1   TRUE METRIX BLOOD GLUCOSE TEST test strip SMARTSIG:Via Meter  TRUEplus Lancets 33G MISC      Current Facility-Administered Medications  Medication Dose Route Frequency Provider Last Rate Last Admin   0.9 %  sodium chloride infusion  500 mL Intravenous Once Amarien Carne V, DO       Benralizumab SOSY 30 mg  30 mg Subcutaneous Q28 days Alfonse Spruce, MD   30 mg at 10/18/20 1542    Allergies as of 08/30/2022 - Review Complete 08/30/2022  Allergen Reaction Noted   Amoxicillin Rash 05/08/2018   Aspirin Anaphylaxis 10/17/2012   Other Anaphylaxis 07/29/2020   Hydrocodone Itching 04/27/2015   Latex Hives and Itching 02/04/2017   Oxycodone  08/04/2020   Levaquin [levofloxacin]  05/21/2018    Family History  Problem Relation Age of Onset   Hypertension Mother    Hypertension Father    Stomach cancer Niece    Breast cancer Niece    Colon cancer Neg Hx    Esophageal cancer Neg Hx    Rectal cancer Neg Hx     Social History   Socioeconomic History   Marital status: Divorced    Spouse name: Not on file   Number of children: 3   Years of education: Not on file   Highest education level: Associate degree: occupational, Scientist, product/process development, or vocational program  Occupational History   Occupation: retired  Tobacco Use   Smoking status: Former    Packs/day: 0.25    Years: 18.00    Additional pack years: 0.00    Total pack years: 4.50    Types:  Cigarettes    Start date: 09/24/1994    Quit date: 03/06/2013    Years since quitting: 9.4   Smokeless tobacco: Never  Vaping Use   Vaping Use: Never used  Substance and Sexual Activity   Alcohol use: Not Currently    Alcohol/week: 2.0 - 3.0 standard drinks of alcohol    Types: 2 - 3 Glasses of wine per week   Drug use: No   Sexual activity: Not Currently    Birth control/protection: Post-menopausal  Other Topics Concern   Not on file  Social History Narrative   2 daughters and 1 son, visit frequently per pt.    Social Determinants of Health   Financial Resource Strain: High Risk (08/23/2022)   Overall Financial Resource Strain (CARDIA)    Difficulty of Paying Living Expenses: Hard  Food Insecurity: No Food Insecurity (08/23/2022)   Hunger Vital Sign    Worried About Running Out of Food in the Last Year: Never true    Ran Out of Food in the Last Year: Never true  Transportation Needs: No Transportation Needs (08/23/2022)   PRAPARE - Administrator, Civil Service (Medical): No    Lack of Transportation (Non-Medical): No  Physical Activity: Insufficiently Active (08/23/2022)   Exercise Vital Sign    Days of Exercise per Week: 3 days    Minutes of Exercise per Session: 10 min  Stress: Stress Concern Present (08/23/2022)   [employer] of Occupational Health - Occupational Stress Questionnaire    Feeling of Stress : Rather much  Social Connections: Moderately Isolated (08/23/2022)   Social Connection and Isolation Panel [NHANES]    Frequency of Communication with Friends and Family: Twice a week    Frequency of Social Gatherings with Friends and Family: Once a week    Attends Religious Services: 1 to 4 times per year    Active Member of [employer] or Organizations: No    Attends Banker  Meetings: Not on file    Marital Status: Divorced  Intimate Partner Violence: Not At Risk (03/14/2021)   Humiliation, Afraid, Rape, and Kick questionnaire    Fear of  Current or Ex-Partner: No    Emotionally Abused: No    Physically Abused: No    Sexually Abused: No    Physical Exam: Vital signs in last 24 hours:  113/71   Pulse 70   Temp 98.4 F (36.9 C)   Ht  (1.626 m)   Wt 196 lb (88.9 kg)   LMP 09/17/2016 (Approximate)   SpO2 96%   BMI 33.64 kg/m  GEN: NAD EYE: Sclerae anicteric ENT: MMM CV: Non-tachycardic Pulm: CTA b/l GI: Soft, NT/ND NEURO:  Alert & Oriented x 3   Doristine Locks, DO [facility]   08/30/2022 10:11 AM

## 2022-08-30 NOTE — Progress Notes (Signed)
Report to PACU, RN, vss, BBS= Clear.  

## 2022-08-31 ENCOUNTER — Telehealth: Payer: Self-pay | Admitting: Oncology

## 2022-08-31 ENCOUNTER — Inpatient Hospital Stay (HOSPITAL_BASED_OUTPATIENT_CLINIC_OR_DEPARTMENT_OTHER): Payer: Medicare HMO | Admitting: Oncology

## 2022-08-31 ENCOUNTER — Inpatient Hospital Stay: Payer: Medicare HMO | Attending: Oncology

## 2022-08-31 ENCOUNTER — Telehealth: Payer: Self-pay

## 2022-08-31 VITALS — BP 130/91 | HR 74 | Temp 98.0°F | Resp 18 | Wt 199.3 lb

## 2022-08-31 DIAGNOSIS — Z86718 Personal history of other venous thrombosis and embolism: Secondary | ICD-10-CM | POA: Diagnosis not present

## 2022-08-31 DIAGNOSIS — I2693 Single subsegmental pulmonary embolism without acute cor pulmonale: Secondary | ICD-10-CM | POA: Diagnosis not present

## 2022-08-31 DIAGNOSIS — Z86711 Personal history of pulmonary embolism: Secondary | ICD-10-CM | POA: Diagnosis not present

## 2022-08-31 DIAGNOSIS — Z7901 Long term (current) use of anticoagulants: Secondary | ICD-10-CM | POA: Diagnosis not present

## 2022-08-31 DIAGNOSIS — R7989 Other specified abnormal findings of blood chemistry: Secondary | ICD-10-CM | POA: Diagnosis not present

## 2022-08-31 DIAGNOSIS — I82411 Acute embolism and thrombosis of right femoral vein: Secondary | ICD-10-CM | POA: Diagnosis not present

## 2022-08-31 DIAGNOSIS — Z5181 Encounter for therapeutic drug level monitoring: Secondary | ICD-10-CM | POA: Diagnosis not present

## 2022-08-31 LAB — CBC WITH DIFFERENTIAL/PLATELET
Abs Immature Granulocytes: 0.01 10*3/uL (ref 0.00–0.07)
Basophils Absolute: 0 10*3/uL (ref 0.0–0.1)
Basophils Relative: 1 %
Eosinophils Absolute: 0.3 10*3/uL (ref 0.0–0.5)
Eosinophils Relative: 7 %
HCT: 40.5 % (ref 36.0–46.0)
Hemoglobin: 13.5 g/dL (ref 12.0–15.0)
Immature Granulocytes: 0 %
Lymphocytes Relative: 39 %
Lymphs Abs: 1.8 10*3/uL (ref 0.7–4.0)
MCH: 29.4 pg (ref 26.0–34.0)
MCHC: 33.3 g/dL (ref 30.0–36.0)
MCV: 88.2 fL (ref 80.0–100.0)
Monocytes Absolute: 0.3 10*3/uL (ref 0.1–1.0)
Monocytes Relative: 7 %
Neutro Abs: 2.2 10*3/uL (ref 1.7–7.7)
Neutrophils Relative %: 46 %
Platelets: 277 10*3/uL (ref 150–400)
RBC: 4.59 MIL/uL (ref 3.87–5.11)
RDW: 15 % (ref 11.5–15.5)
WBC: 4.7 10*3/uL (ref 4.0–10.5)
nRBC: 0 % (ref 0.0–0.2)

## 2022-08-31 LAB — COMPREHENSIVE METABOLIC PANEL
ALT: 15 U/L (ref 0–44)
AST: 14 U/L — ABNORMAL LOW (ref 15–41)
Albumin: 4.1 g/dL (ref 3.5–5.0)
Alkaline Phosphatase: 84 U/L (ref 38–126)
Anion gap: 5 (ref 5–15)
BUN: 10 mg/dL (ref 6–20)
CO2: 30 mmol/L (ref 22–32)
Calcium: 9.3 mg/dL (ref 8.9–10.3)
Chloride: 106 mmol/L (ref 98–111)
Creatinine, Ser: 0.61 mg/dL (ref 0.44–1.00)
GFR, Estimated: >60 mL/min (ref >60)
Glucose, Bld: 92 mg/dL (ref 70–99)
Potassium: 3.7 mmol/L (ref 3.5–5.1)
Sodium: 141 mmol/L (ref 135–145)
Total Bilirubin: 0.8 mg/dL (ref 0.3–1.2)
Total Protein: 7.4 g/dL (ref 6.5–8.1)

## 2022-08-31 LAB — D-DIMER, QUANTITATIVE: D-Dimer, Quant: 1.63 ug/mL-FEU — ABNORMAL HIGH (ref 0.00–0.50)

## 2022-08-31 NOTE — Telephone Encounter (Signed)
Returned pt's call. She reports having soreness on her right lower abdomen after colonoscopy yesterday. States "it's just a little soreness." When asked to rate the soreness pt reports a 7/10. Then pt proceeded to states "I will be fine, I just wanted you all to know." Pt cancelled her appointment with doctor for blood clot in her right leg d/t the soreness and not feeling like she could do all of the walking involved to get to the appointment. RN reviewed methods of relieving gas pain including: GasX, heating pad, warm drinks and ambulation. MD will be made aware of pt's complaint and if any further recommendations an RN will call pt back. Pt agreeable to plan of care.

## 2022-08-31 NOTE — Telephone Encounter (Signed)
No answer, left message to call if having any issues or concerns, B.Keylon Labelle RN 

## 2022-08-31 NOTE — Telephone Encounter (Signed)
Patient called , she had a colonoscopy yesterday and can't wait to reschedule with the dr, she is going to come here and have someone wheel her back to labs and her appointment.

## 2022-08-31 NOTE — Telephone Encounter (Signed)
Had procedure yesterday and is feeling pain in her abdomen. Would like to discuss with nurse.

## 2022-08-31 NOTE — Progress Notes (Signed)
[facility] Scheduled follow up Visit:  Patient Care Team: Babs Sciara, MD as PCP - General (Family Medicine)  CHIEF COMPLAINTS/PURPOSE OF CONSULTATION: HISTORY OF PRESENTING ILLNESS: Stephanie [name] 62 y.o. female is here because of VTE  May 08 2018:  CT PA negative for PE September 07 2020:  CT PA negative for PE November 28 2021:  Presented to [facility] with right leg pain/swelling and SOB  CT PA Small filling defect in a right upper lobe subsegmental artery suggesting small pulmonary embolism. No evidence of right heart strain. Cardiomegaly. LE U/S showed occlusive thrombus in the right peroneal vein.  Patient was placed on Xarelto and discharged home from the ED.   December 01 2021:  Presented to [facility] with near syncope CT PA negative for PE.    January 19 2022:  [facility] Remains on Xarelto without bleeding problems  Patient is G4 P3013 and delivered by C section without history of VTE.  She has also undergone sequential hip arthroplasties without VTE.   She states that she had a bad fall in April 2023 during the course of which she injured the right knee so severely that she was placed in a knee brace.  She wore the brace for about a month.  In June 2023 patient took a road trip to [facility] without stopping.  Activities have also been limited because she has degenerative spine disease.    No family history of VTE  WBC 5.5 hemoglobin 14.2 platelet count 274; 48 segs 39 lymphs 6 monos 6 eos 1 basophil D dimer 1.43 CMP normal and notable for Cr 0.56  April 20 2022:  Scheduled follow up for management of VTE.  Reviewed results of labs with patient.  Remains on Xarelto 20 mg daily.  November 12th had COVID; had sore throat, headache, diarrhea, weakness, SOB.  Symptoms are improving.  No bleeding issues.  HERDOO2 score 2 .  7.4% risk of major VTE per 100 patient years.  Not low.  Reviewed result with patient.   Will check  labs today and continue Xarelto at full dose because of elevated d dimer and recent COVID infection  WBC 5.2 hemoglobin 13.8 platelet count 263; 57 segs 31 lymphs 8 monos 3 eos 1 basophil D-dimer 1.24 CMP normal  July 09, 2022: Presented to emergency room with several days of numbness across lower abdomen rating to the legs as well as cough productive of some bloody sputum  CT angio of chest negative for PE  July 27, 2022:   Since last visit has been experiencing abdominal discomfort, itching of back and head which she attributes to Xarelto.  Has experienced mild epistaxis which she found concerning.  States that she had a dark stool on the day of her visit to the ED.  Has not experienced frank BRBPR or melena.  Currently on Xarelto 20 mg daily.  Changed patient to Eliquis 2.5 mg po bid  Ferritin 56.  Hgb 14.1 D dimer 1.52  Cr 0.55  August 31 2022:  Colonoscopy revealed diverticulosis in the sigmoid colon and in the descending colon.  August 31 2022:  Scheduled follow-up for management of VTE.  On Eliquis 2.5 mg po bid. Itching much improved since change from Xarelto.  Some lower abdominal discomfort since colonoscopy.  Patient called GI to inform them of this.  No bleeding issues.  Ambulates with three point cane else has falls.  Recommend that she continues to use a  cane.  Continue Eliquis.  The history of VTE would not account for gait instability but could account for LE edema.  Consider compression stockings.    Hgb 13.5 D dimer 1.63  Review of Systems  Constitutional:  Positive for fatigue. Negative for chills, fever and unexpected weight change.       Occasional drenching night sweats.  No hot flashes  HENT:   Negative for mouth sores, nosebleeds and trouble swallowing.   Eyes:  Positive for eye problems. Negative for icterus.       Occasional blurry vision.    Respiratory:  Positive for shortness of breath. Negative for cough and hemoptysis.        Episodes of chest tightness at  rest.  Has discussed this with PCP DOE on walking up and down her driveway (100 feet) attributes this to asthma  Cardiovascular:  Positive for chest pain, leg swelling and palpitations.  Gastrointestinal:  Positive for diarrhea. Negative for abdominal pain, blood in stool and constipation.  Genitourinary:  Negative for dysuria, frequency and hematuria.   Musculoskeletal:  Positive for arthralgias, back pain and gait problem.       Myalgias or right leg Arthralgias of right leg  Neurological:  Positive for dizziness, gait problem and numbness.       Looses balance.    Hematological:  Negative for adenopathy. Bruises/bleeds easily.  Psychiatric/Behavioral:  Positive for sleep disturbance. Negative for suicidal ideas. The patient is nervous/anxious.        Doesn't feel good and has SOB which interferes with sleep    MEDICAL HISTORY: Past Medical History:  Diagnosis Date   Allergic rhinitis    Allergy    Arthritis    Asthma    Depression    DVT (deep venous thrombosis)    August 2023   GERD (gastroesophageal reflux disease)    Heart murmur    Hypertension    Pneumonia    hx of x 3     SURGICAL HISTORY: Past Surgical History:  Procedure Laterality Date   CESAREAN SECTION  1985, 1989, 1995   TONSILLECTOMY     TOTAL HIP ARTHROPLASTY Right 03/27/2017   Procedure: RIGHT TOTAL HIP ARTHROPLASTY ANTERIOR APPROACH;  Surgeon: Ollen Gross, MD;  Location: [facility];  Service: Orthopedics;  Laterality: Right;   TOTAL HIP ARTHROPLASTY Left 08/17/2020   Procedure: TOTAL HIP ARTHROPLASTY ANTERIOR APPROACH;  Surgeon: Ollen Gross, MD;  Location: [facility];  Service: Orthopedics;  Laterality: Left;     SOCIAL HISTORY: Social History   Socioeconomic History   Marital status: Divorced    Spouse name: Not on file   Number of children: 3   Years of education: Not on file   Highest education level: Associate degree: occupational, Scientist, product/process development, or vocational program  Occupational History    Occupation: retired  Tobacco Use   Smoking status: Former    Packs/day: 0.25    Years: 18.00    Additional pack years: 0.00    Total pack years: 4.50    Types: Cigarettes    Start date: 09/24/1994    Quit date: 03/06/2013    Years since quitting: 9.4   Smokeless tobacco: Never  Vaping Use   Vaping Use: Never used  Substance and Sexual Activity   Alcohol use: Not Currently    Alcohol/week: 2.0 - 3.0 standard drinks of alcohol    Types: 2 - 3 Glasses of wine per week   Drug use: No   Sexual activity: Not Currently  Birth control/protection: Post-menopausal  Other Topics Concern   Not on file  Social History Narrative   2 daughters and 1 son, visit frequently per pt.    Social Determinants of Health   Financial Resource Strain: High Risk (08/23/2022)   Overall Financial Resource Strain (CARDIA)    Difficulty of Paying Living Expenses: Hard  Food Insecurity: No Food Insecurity (08/23/2022)   Hunger Vital Sign    Worried About Running Out of Food in the Last Year: Never true    Ran Out of Food in the Last Year: Never true  Transportation Needs: No Transportation Needs (08/23/2022)   PRAPARE - Administrator, Civil Service (Medical): No    Lack of Transportation (Non-Medical): No  Physical Activity: Insufficiently Active (08/23/2022)   Exercise Vital Sign    Days of Exercise per Week: 3 days    Minutes of Exercise per Session: 10 min  Stress: Stress Concern Present (08/23/2022)   [employer] of Occupational Health - Occupational Stress Questionnaire    Feeling of Stress : Rather much  Social Connections: Moderately Isolated (08/23/2022)   Social Connection and Isolation Panel [NHANES]    Frequency of Communication with Friends and Family: Twice a week    Frequency of Social Gatherings with Friends and Family: Once a week    Attends Religious Services: 1 to 4 times per year    Active Member of [employer] or Organizations: No    Attends Hospital doctor: Not on file    Marital Status: Divorced  Intimate Partner Violence: Not At Risk (03/14/2021)   Humiliation, Afraid, Rape, and Kick questionnaire    Fear of Current or Ex-Partner: No    Emotionally Abused: No    Physically Abused: No    Sexually Abused: No    FAMILY HISTORY Family History  Problem Relation Age of Onset   Hypertension Mother    Hypertension Father    Stomach cancer Niece    Breast cancer Niece    Colon cancer Neg Hx    Esophageal cancer Neg Hx    Rectal cancer Neg Hx     ALLERGIES:  is allergic to amoxicillin, aspirin, other, hydrocodone, latex, oxycodone, and levaquin [levofloxacin].  MEDICATIONS:  Current Outpatient Medications  Medication Sig Dispense Refill   albuterol (PROVENTIL HFA) 108 (90 Base) MCG/ACT inhaler Inhale 2 puffs into the lungs every 4 (four) hours as needed for wheezing or shortness of breath. 18 g 5   albuterol (PROVENTIL) (2.5 MG/3ML) 0.083% nebulizer solution TAKE 3ml BY NEBULIZER EVERY 4 HOURS AS NEEDED FOR WHEEZING OR SHORTNESS OF BREATH (Patient taking differently: Take 2.5 mg by nebulization every 4 (four) hours as needed for shortness of breath or wheezing.) 180 mL 1   amLODipine (NORVASC) 5 MG tablet Take 0.5 tablets (2.5 mg total) by mouth daily. Takes 1 tablet 2.5 mg daily. 30 tablet 5   apixaban (ELIQUIS) 2.5 MG TABS tablet Take 1 tablet (2.5 mg total) by mouth 2 (two) times daily. 60 tablet 3   Ascorbic Acid (VITAMIN C) 1000 MG tablet Take 1,000 mg by mouth daily.     Cholecalciferol (VITAMIN D3) 50 MCG (2000 UT) TABS Take by mouth daily at 6 (six) AM.     clotrimazole-betamethasone (LOTRISONE) cream Apply 1 Application topically 2 (two) times daily. 30 g 0   EPINEPHrine (EPIPEN 2-PAK) 0.3 mg/0.3 mL IJ SOAJ injection Inject 0.3 mg into the muscle once as needed for anaphylaxis (allergic reaction). (Patient not taking: Reported on 08/30/2022)  2 each 1   fluticasone-salmeterol (ADVAIR) 100-50 MCG/ACT AEPB Inhale 1 puff into the  lungs 2 (two) times daily. 1 each 3   GARLIC PO Take by mouth.     ketoconazole (NIZORAL) 2 % cream Apply cream to affect area BID 30 g 1   methocarbamol (ROBAXIN) 500 MG tablet Take by mouth every 6 (six) hours as needed for muscle spasms.     omeprazole (PRILOSEC) 40 MG capsule Take 1 capsule (40 mg total) by mouth daily. 30 capsule 5   pregabalin (LYRICA) 25 MG capsule Take 1 capsule (25 mg total) by mouth 2 (two) times daily. 60 capsule 2   triamcinolone (NASACORT) 55 MCG/ACT AERO nasal inhaler Place 1 spray into the nose daily. 16.9 g 5   triamcinolone cream (KENALOG) 0.1 % Apply thin amount bid prn to spots that itch 15 g 1   TRUE METRIX BLOOD GLUCOSE TEST test strip SMARTSIG:Via Meter     TRUEplus Lancets 33G MISC      vitamin E 180 MG (400 UNITS) capsule Take 400 Units by mouth daily.     Zinc 50 MG CAPS Take by mouth daily at 6 (six) AM.     Current Facility-Administered Medications  Medication Dose Route Frequency Provider Last Rate Last Admin   Benralizumab SOSY 30 mg  30 mg Subcutaneous Q28 days Alfonse Spruce, MD   30 mg at 10/18/20 1542    PHYSICAL EXAMINATION:  ECOG PERFORMANCE STATUS: 0 - Asymptomatic   Vitals:   08/31/22 1021  BP: (!) 130/91  Pulse: 74  Resp: 18  Temp: 98 F (36.7 C)  SpO2: 97%     Filed Weights   08/31/22 1021  Weight: 199 lb 4.8 oz (90.4 kg)      Physical Exam Vitals and nursing note reviewed.  Constitutional:      General: She is not in acute distress.    Appearance: Normal appearance. She is obese. She is not ill-appearing, toxic-appearing or diaphoretic.     Comments: Here alone.  Seated in a chair.    HENT:     Head: Normocephalic and atraumatic.     Right Ear: External ear normal.     Left Ear: External ear normal.     Nose: Nose normal. No congestion or rhinorrhea.  Eyes:     General: No scleral icterus.    Extraocular Movements: Extraocular movements intact.     Conjunctiva/sclera: Conjunctivae normal.      Pupils: Pupils are equal, round, and reactive to light.  Cardiovascular:     Rate and Rhythm: Normal rate.     Heart sounds: No murmur heard.    No friction rub. No gallop.  Pulmonary:     Effort: Pulmonary effort is normal. No respiratory distress.     Breath sounds: Normal breath sounds. No stridor. No wheezing or rhonchi.  Abdominal:     General: Bowel sounds are normal. There is no distension.     Palpations: Abdomen is soft. There is no mass.     Tenderness: There is no abdominal tenderness. There is no guarding or rebound.     Hernia: No hernia is present.  Musculoskeletal:        General: No swelling, tenderness or deformity.     Cervical back: Normal range of motion and neck supple. No rigidity or tenderness.     Right lower leg: No edema.     Left lower leg: No edema.  Lymphadenopathy:     Head:  Right side of head: No submental, submandibular, tonsillar, preauricular, posterior auricular or occipital adenopathy.     Left side of head: No submental, submandibular, tonsillar, preauricular, posterior auricular or occipital adenopathy.     Cervical: No cervical adenopathy.     Right cervical: No superficial, deep or posterior cervical adenopathy.    Left cervical: No superficial, deep or posterior cervical adenopathy.     Upper Body:     Right upper body: No supraclavicular, axillary, pectoral or epitrochlear adenopathy.     Left upper body: No supraclavicular, axillary, pectoral or epitrochlear adenopathy.  Skin:    General: Skin is warm.     Coloration: Skin is not jaundiced or pale.     Findings: No erythema, lesion or rash.  Neurological:     General: No focal deficit present.     Mental Status: She is alert and oriented to person, place, and time. Mental status is at baseline.     Cranial Nerves: No cranial nerve deficit.  Psychiatric:        Mood and Affect: Mood normal.        Behavior: Behavior normal.        Thought Content: Thought content normal.         Judgment: Judgment normal.      LABORATORY DATA: I have personally reviewed the data as listed:  Appointment on 08/31/2022  Component Date Value Ref Range Status   WBC 08/31/2022 4.7  4.0 - 10.5 K/uL Final   RBC 08/31/2022 4.59  3.87 - 5.11 MIL/uL Final   Hemoglobin 08/31/2022 13.5  12.0 - 15.0 g/dL Final   HCT 16/02/9603 40.5  36.0 - 46.0 % Final   MCV 08/31/2022 88.2  80.0 - 100.0 fL Final   MCH 08/31/2022 29.4  26.0 - 34.0 pg Final   MCHC 08/31/2022 33.3  30.0 - 36.0 g/dL Final   RDW 54/01/8118 15.0  11.5 - 15.5 % Final   Platelets 08/31/2022 277  150 - 400 K/uL Final   nRBC 08/31/2022 0.0  0.0 - 0.2 % Final   Neutrophils Relative % 08/31/2022 46  % Final   Neutro Abs 08/31/2022 2.2  1.7 - 7.7 K/uL Final   Lymphocytes Relative 08/31/2022 39  % Final   Lymphs Abs 08/31/2022 1.8  0.7 - 4.0 K/uL Final   Monocytes Relative 08/31/2022 7  % Final   Monocytes Absolute 08/31/2022 0.3  0.1 - 1.0 K/uL Final   Eosinophils Relative 08/31/2022 7  % Final   Eosinophils Absolute 08/31/2022 0.3  0.0 - 0.5 K/uL Final   Basophils Relative 08/31/2022 1  % Final   Basophils Absolute 08/31/2022 0.0  0.0 - 0.1 K/uL Final   Immature Granulocytes 08/31/2022 0  % Final   Abs Immature Granulocytes 08/31/2022 0.01  0.00 - 0.07 K/uL Final   Performed at [facility], [address], [place], [place] [address]    RADIOGRAPHIC STUDIES: I have personally reviewed the radiological images as listed and agree with the findings in the report  No results found.  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  VTE diagnosed in July 2023:  Patient presented to ED with SOB as well as pain and swelling in distal RLE.  CT PA showed small filling defect in a right upper lobe subsegmental artery suggesting small pulmonary embolism. LE U/S showed occlusive thrombus in the right peroneal vein.  Patient was placed on Xarelto  in the ED  and appropriately discharged home.    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  These have been in the form of surgery and childbirth.  The time frame of RLE injury makes it unlikely to be a major contributing factor.  The road trip to [facility] is also not likely a major factory.  Her main risk factor is likely obesity.  It is reasonable to classify this this as an unprovoked event.  Asthma is not considered a risk factor for VTE  Duration of anticoagulation:   July 27 2022:  HERDOO2 score 2 .  7.4% risk of major VTE per 100 patient years.  Not low.  Continuation of anticoagulation recommended per that model.  She is not anemic, iron stores normal.  D dimer remains elevated therefore full dose anticoagulation is reasonable   August 31 2022- Hgb 13.5 D dimer 1.63.  Remains on Eliquis 2.5 mg po bid which should be continued  Symptom management July 09 2022- Presented to ED with numbness across lower abdomen and nonproductive cough.  She attributed those symptoms to anticoagulation July 27 2022- Advised patient to call if she has questions about anticoagulation medications so as to avoid need for ED visit August 31 2022- Can consider compression stockings for LE edema which may be related in part to history of DVT  Abdominal pain  August 31 2022- Recommended that patient contact GI if this persists   Cancer Staging  No matching staging information was found for the patient.   No problem-specific Assessment & Plan notes found for this encounter.   No orders of the defined types were placed in this encounter.  30 minutes was spent in patient care.  This included time spent preparing to see the patient (e.g., review of tests), obtaining and/or reviewing separately obtained history, counseling and educating the patient, ordering medications, tests  documenting clinical information in the electronic  or other health record, independently interpreting results and communicating results to the patient/family/caregiver as well as coordination of care.      All questions were answered. The patient knows to call the clinic with any problems, questions or concerns.  30 minutes spent in face to face contact with patient.    This note was electronically signed.    Loni Muse, MD  08/31/2022 10:32 AM

## 2022-09-05 ENCOUNTER — Ambulatory Visit
Admission: RE | Admit: 2022-09-05 | Discharge: 2022-09-05 | Disposition: A | Discharge status: Home / Self Care | Payer: Medicare HMO | Source: Ambulatory Visit | Attending: Family Medicine | Admitting: Family Medicine

## 2022-09-05 ENCOUNTER — Ambulatory Visit: Payer: Medicare HMO | Admitting: Podiatry

## 2022-09-05 DIAGNOSIS — D2372 Other benign neoplasm of skin of left lower limb, including hip: Secondary | ICD-10-CM | POA: Diagnosis not present

## 2022-09-05 DIAGNOSIS — Z1231 Encounter for screening mammogram for malignant neoplasm of breast: Secondary | ICD-10-CM | POA: Diagnosis not present

## 2022-09-05 DIAGNOSIS — D2371 Other benign neoplasm of skin of right lower limb, including hip: Secondary | ICD-10-CM

## 2022-09-05 DIAGNOSIS — D2370 Other benign neoplasm of skin of unspecified lower limb, including hip: Secondary | ICD-10-CM

## 2022-09-05 MED ORDER — CLOTRIMAZOLE-BETAMETHASONE 1-0.05 % EX CREA: 1.0000 | TOPICAL_CREAM | Freq: Two times a day (BID) | CUTANEOUS | 2 refills | Status: DC

## 2022-09-05 NOTE — Progress Notes (Signed)
   Chief Complaint  Patient presents with   Callouses    Callus     Subjective: 62 y.o. female presenting to the office today for routine footcare.  Patient states that she has pain and tenderness associated to the bilateral feet secondary to symptomatic lesions along the plantar aspect of the feet.  Presenting for further treatment evaluation   Past Medical History:  Diagnosis Date   Allergic rhinitis    Allergy    Arthritis    Asthma    Depression    DVT (deep venous thrombosis)    August 2023   GERD (gastroesophageal reflux disease)    Heart murmur    Hypertension    Pneumonia    hx of x 3     Past Surgical History:  Procedure Laterality Date   CESAREAN SECTION  1985, 1989, 1995   TONSILLECTOMY     TOTAL HIP ARTHROPLASTY Right 03/27/2017   Procedure: RIGHT TOTAL HIP ARTHROPLASTY ANTERIOR APPROACH;  Surgeon: Ollen Gross, MD;  Location: [facility];  Service: Orthopedics;  Laterality: Right;   TOTAL HIP ARTHROPLASTY Left 08/17/2020   Procedure: TOTAL HIP ARTHROPLASTY ANTERIOR APPROACH;  Surgeon: Ollen Gross, MD;  Location: [facility];  Service: Orthopedics;  Laterality: Left;     Allergies  Allergen Reactions   Amoxicillin Rash    fash swelled up and itching 04/2018   Aspirin Anaphylaxis   Other Anaphylaxis    Nsaids   Hydrocodone Itching        Latex Hives and Itching   Oxycodone     itching   Levaquin [Levofloxacin]     Rash on chest no hives 04/2018     Objective:  Physical Exam General: Alert and oriented x3 in no acute distress  Dermatology: Hyperkeratotic lesion(s) present on the weightbearing surfaces of the bilateral feet. Pain on palpation with a central nucleated core noted. Skin is warm, dry and supple bilateral lower extremities. Negative for open lesions or macerations.  Vascular: Palpable pedal pulses bilaterally. No edema or erythema noted. Capillary refill within normal limits.  Neurological: Epicritic and protective threshold grossly  intact bilaterally.   Musculoskeletal Exam: Pain on palpation at the keratotic lesion(s) noted. Range of motion within normal limits bilateral. Muscle strength 5/5 in all groups bilateral.  Assessment: 1.  Benign neoplasm of skin foot bilateral   Plan of Care:  1. Patient evaluated 2. Excisional debridement of keratoic lesion(s) using a chisel blade was performed without incident.  Salicylic acid applied. 3. Dressed area with light dressing. 4. Patient is to return to the clinic PRN.   Felecia Shelling, DPM [facility]  Dr. Felecia Shelling, DPM    [address] [place], [place] [address]                Office [phone]  Fax [phone]

## 2022-09-17 ENCOUNTER — Ambulatory Visit (HOSPITAL_COMMUNITY): Payer: Medicare HMO

## 2022-10-02 ENCOUNTER — Telehealth: Payer: Self-pay

## 2022-10-02 NOTE — Progress Notes (Signed)
[facility])  [facility] Quality Pharmacy Team   10/02/2022  Stephanie [name] 10-04-1960 [id number]  Reason for referral: Medication assistance  Referral source: [facility] RN Current insurance: [employer]  Outreach:  Successful telephone call with Stephanie [name].  HIPAA identifiers verified. Follow up call was made to see if patient had gotten closer to the 3% OOP spend to meet the PAP requirements for Eliquis. The patient has refilled the medication twice but is still very far from meeting the [facility] requirement. The patient reports she is able to pay the $45 copay until the medication is discontinued.     Plan: I have provided the patient with my contact information. She will reach back out if she has any further medication assistance needs. Will close [facility] pharmacy case as no further medication needs identified at this time.  Am happy to assist in the future as needed.  Thank you for allowing [facility] pharmacy to be a part of this patient's care.   Harlon Flor, PharmD Clinical Pharmacist  [facility] [phone]

## 2022-10-19 ENCOUNTER — Ambulatory Visit (INDEPENDENT_AMBULATORY_CARE_PROVIDER_SITE_OTHER): Payer: Medicare HMO | Admitting: Family Medicine

## 2022-10-19 VITALS — BP 134/86 | HR 79 | Ht 64.0 in | Wt 202.8 lb

## 2022-10-19 DIAGNOSIS — I10 Essential (primary) hypertension: Secondary | ICD-10-CM

## 2022-10-19 DIAGNOSIS — U099 Post covid-19 condition, unspecified: Secondary | ICD-10-CM | POA: Diagnosis not present

## 2022-10-19 NOTE — Progress Notes (Signed)
   Acute Office Visit  Subjective:     Patient ID: NYASHA RAHILLY, female    DOB: December 06, 1960, 62 y.o.   MRN: [id number]  No chief complaint on file.   HPI Patient is in today for here today for follow-up  ROS      Objective:    BP (!) 138/96   Pulse 79   Ht 5\' 4"  (1.626 m)   Wt 202 lb 12.8 oz (92 kg)   LMP 09/17/2016 (Approximate)   SpO2 97%   BMI 34.81 kg/m   Patient relates overall she has been feeling better less headaches just denies any type of headaches currently denies sweats chills wheezing difficulty breathing Energy level fair She still finds herself fatigued She is worried about getting COVID again so she wears a mask everywhere she goes     Assessment & Plan:   1. Long COVID Overall she is doing much better her headaches have subsided feels that she has more energy she never went and got the MRI done because it was going to be $500 out-of-pocket there is no need to do the MRI  2. Essential hypertension Blood pressure good control  Patient is disabled recommend follow-up in the fall  Discussion held with patient regarding use of mask at this point is more patient preference not a bad idea for her to utilize a mask in crowds but among immediate family if they are well the chance of her getting sick is relatively low-she will need to make her own decision

## 2022-10-19 NOTE — Progress Notes (Signed)
   Subjective:    Patient ID: Stephanie [name], female    DOB: 09-30-1960, 62 y.o.   MRN: [id number]  HPI  Patient arrives today for 3 month check up. Patient stopped taken Lyrica because it made her heart race.     Review of Systems     Objective:   Physical Exam        Assessment & Plan:

## 2022-11-10 ENCOUNTER — Other Ambulatory Visit: Payer: Self-pay | Admitting: Family Medicine

## 2022-11-12 NOTE — Telephone Encounter (Signed)
Please verify that the patient is taking this medicine currently

## 2022-11-12 NOTE — Telephone Encounter (Signed)
Please verify with patient is she currently taking this medicine?

## 2022-11-13 ENCOUNTER — Telehealth: Payer: Self-pay | Admitting: Family Medicine

## 2022-11-13 NOTE — Telephone Encounter (Signed)
Nurses I received a refill request for amlodipine 2.5 Please verify with the patient the milligrams she is taking then please let me know then we can move forward with this issue thank you

## 2022-11-14 NOTE — Telephone Encounter (Signed)
Message sent to notify patient.

## 2022-11-14 NOTE — Telephone Encounter (Signed)
2.5 mg amlodipine was sent in 90-day with 1 refill thank you

## 2022-12-05 NOTE — Progress Notes (Signed)
[facility] Scheduled follow up Visit:  Patient Care Team: Babs Sciara, MD as PCP - General (Family Medicine)  CHIEF COMPLAINTS/PURPOSE OF CONSULTATION: HISTORY OF PRESENTING ILLNESS: Stephanie [name] 62 y.o. female is here because of VTE  May 08 2018:  CT PA negative for PE September 07 2020:  CT PA negative for PE November 28 2021:  Presented to [facility] with right leg pain/swelling and SOB  CT PA Small filling defect in a right upper lobe subsegmental artery suggesting small pulmonary embolism. No evidence of right heart strain. Cardiomegaly. LE U/S showed occlusive thrombus in the right peroneal vein.  Patient was placed on Xarelto and discharged home from the ED.   December 01 2021:  Presented to [facility] with near syncope CT PA negative for PE.    January 19 2022:   Hematology Consult Remains on Xarelto without bleeding problems  Patient is G4 P3013 and delivered by C section without history of VTE.  She has also undergone sequential hip arthroplasties without VTE.   She states that she had a bad fall in April 2023 during the course of which she injured the right knee so severely that she was placed in a knee brace.  She wore the brace for about a month.  In June 2023 patient took a road trip to [facility] without stopping.  Activities have also been limited because she has degenerative spine disease.    No family history of VTE  WBC 5.5 hemoglobin 14.2 platelet count 274; 48 segs 39 lymphs 6 monos 6 eos 1 basophil D dimer 1.43 CMP normal and notable for Cr 0.56  April 20 2022:  Scheduled follow up for management of VTE.  Reviewed results of labs with patient.  Remains on Xarelto 20 mg daily.  November 12th had COVID; had sore throat, headache, diarrhea, weakness, SOB.  Symptoms are improving.  No bleeding issues.  HERDOO2 score 2 .  7.4% risk of major VTE per 100 patient years.  Not low.  Reviewed result with patient.   Will check  labs today and continue Xarelto at full dose because of elevated d dimer and recent COVID infection  WBC 5.2 hemoglobin 13.8 platelet count 263; 57 segs 31 lymphs 8 monos 3 eos 1 basophil D-dimer 1.24 CMP normal  July 09, 2022: Presented to emergency room with several days of numbness across lower abdomen rating to the legs as well as cough productive of some bloody sputum  CT angio of chest negative for PE  July 27, 2022:   Since last visit has been experiencing abdominal discomfort, itching of back and head which she attributes to Xarelto.  Has experienced mild epistaxis which she found concerning.  States that she had a dark stool on the day of her visit to the ED.  Has not experienced frank BRBPR or melena.  Currently on Xarelto 20 mg daily.  Changed patient to Eliquis 2.5 mg po bid  Ferritin 56.  Hgb 14.1 D dimer 1.52  Cr 0.55  August 31 2022:  Colonoscopy revealed diverticulosis in the sigmoid colon and in the descending colon.  August 31 2022:    On Eliquis 2.5 mg po bid. Itching much improved since change from Xarelto.  Some lower abdominal discomfort since colonoscopy.  Patient called GI to inform them of this.  No bleeding issues.  Ambulates with three point cane else has falls.  Recommend that she continues to use a cane.  Continue Eliquis.  The history of VTE would not account for gait instability but could account for LE edema.  Consider compression stockings.    Hgb 13.5 D dimer 1.63  December 07 2022:  Scheduled follow-up for management of VTE.  Having arthralgias related to the damp weather.  Using a three point cane.  No falls.  Ran out of Eliquis yesterday; patient reached out to pharmacy and found that they were not cooperative.  No bleeding issues.    Hgb 14.1 PLT 251 Cr 0.59 D dimer 1.16  Review of Systems  Constitutional:  Positive for fatigue. Negative for chills, fever and unexpected weight change.       Occasional drenching night sweats.  No hot flashes  HENT:    Negative for mouth sores, nosebleeds and trouble swallowing.   Eyes:  Positive for eye problems. Negative for icterus.       Occasional blurry vision.    Respiratory:  Positive for shortness of breath. Negative for cough and hemoptysis.        Episodes of chest tightness at rest.  Has discussed this with PCP DOE on walking up and down her driveway (100 feet) attributes this to asthma  Cardiovascular:  Positive for leg swelling and palpitations. Negative for chest pain.  Gastrointestinal:  Negative for abdominal pain, blood in stool, constipation and diarrhea.  Genitourinary:  Negative for dysuria, frequency and hematuria.   Musculoskeletal:  Positive for arthralgias, back pain and gait problem.       Myalgias or right leg Arthralgias of right leg  Neurological:  Positive for dizziness, gait problem and numbness.       Looses balance.    Hematological:  Negative for adenopathy. Bruises/bleeds easily.  Psychiatric/Behavioral:  Positive for sleep disturbance. Negative for suicidal ideas. The patient is nervous/anxious.        Doesn't feel good and has SOB which interferes with sleep    MEDICAL HISTORY: Past Medical History:  Diagnosis Date   Allergic rhinitis    Allergy    Arthritis    Asthma    Depression    DVT (deep venous thrombosis) (HCC)    August 2023   GERD (gastroesophageal reflux disease)    Heart murmur    Hypertension    Pneumonia    hx of x 3     SURGICAL HISTORY: Past Surgical History:  Procedure Laterality Date   CESAREAN SECTION  1985, 1989, 1995   TONSILLECTOMY     TOTAL HIP ARTHROPLASTY Right 03/27/2017   Procedure: RIGHT TOTAL HIP ARTHROPLASTY ANTERIOR APPROACH;  Surgeon: Ollen Gross, MD;  Location: [facility];  Service: Orthopedics;  Laterality: Right;   TOTAL HIP ARTHROPLASTY Left 08/17/2020   Procedure: TOTAL HIP ARTHROPLASTY ANTERIOR APPROACH;  Surgeon: Ollen Gross, MD;  Location: [facility];  Service: Orthopedics;  Laterality: Left;     SOCIAL  HISTORY: Social History   Socioeconomic History   Marital status: Divorced    Spouse name: Not on file   Number of children: 3   Years of education: Not on file   Highest education level: Associate degree: occupational, Scientist, product/process development, or vocational program  Occupational History   Occupation: retired  Tobacco Use   Smoking status: Former    Current packs/day: 0.00    Average packs/day: 0.3 packs/day for 18.4 years (4.6 ttl pk-yrs)    Types: Cigarettes    Start date: 09/24/1994    Quit date: 03/06/2013    Years since quitting: 9.7   Smokeless tobacco: Never  Vaping Use  Vaping status: Never Used  Substance and Sexual Activity   Alcohol use: Not Currently    Alcohol/week: 2.0 - 3.0 standard drinks of alcohol    Types: 2 - 3 Glasses of wine per week   Drug use: No   Sexual activity: Not Currently    Birth control/protection: Post-menopausal  Other Topics Concern   Not on file  Social History Narrative   2 daughters and 1 son, visit frequently per pt.    Social Determinants of Health   Financial Resource Strain: High Risk (08/23/2022)   Overall Financial Resource Strain (CARDIA)    Difficulty of Paying Living Expenses: Hard  Food Insecurity: No Food Insecurity (08/23/2022)   Hunger Vital Sign    Worried About Running Out of Food in the Last Year: Never true    Ran Out of Food in the Last Year: Never true  Transportation Needs: No Transportation Needs (08/23/2022)   PRAPARE - Administrator, Civil Service (Medical): No    Lack of Transportation (Non-Medical): No  Physical Activity: Insufficiently Active (08/23/2022)   Exercise Vital Sign    Days of Exercise per Week: 3 days    Minutes of Exercise per Session: 10 min  Stress: Stress Concern Present (08/23/2022)   [employer] of Occupational Health - Occupational Stress Questionnaire    Feeling of Stress : Rather much  Social Connections: Moderately Isolated (08/23/2022)   Social Connection and Isolation Panel  [NHANES]    Frequency of Communication with Friends and Family: Twice a week    Frequency of Social Gatherings with Friends and Family: Once a week    Attends Religious Services: 1 to 4 times per year    Active Member of [employer] or Organizations: No    Attends Engineer, structural: Not on file    Marital Status: Divorced  Intimate Partner Violence: Not At Risk (07/13/2021)   Received from [facility]   Humiliation, Afraid, Rape, and Kick questionnaire    FAMILY HISTORY Family History  Problem Relation Age of Onset   Hypertension Mother    Hypertension Father    Stomach cancer Niece    Breast cancer Niece    Colon cancer Neg Hx    Esophageal cancer Neg Hx    Rectal cancer Neg Hx     ALLERGIES:  is allergic to amoxicillin, aspirin, other, hydrocodone, latex, oxycodone, and levaquin [levofloxacin].  MEDICATIONS:  Current Outpatient Medications  Medication Sig Dispense Refill   albuterol (PROVENTIL HFA) 108 (90 Base) MCG/ACT inhaler Inhale 2 puffs into the lungs every 4 (four) hours as needed for wheezing or shortness of breath. 18 g 5   albuterol (PROVENTIL) (2.5 MG/3ML) 0.083% nebulizer solution TAKE 3ml BY NEBULIZER EVERY 4 HOURS AS NEEDED FOR WHEEZING OR SHORTNESS OF BREATH (Patient taking differently: Take 2.5 mg by nebulization every 4 (four) hours as needed for shortness of breath or wheezing.) 180 mL 1   amLODipine (NORVASC) 2.5 MG tablet TAKE 1 TABLET BY MOUTH DAILY 90 tablet 1   apixaban (ELIQUIS) 2.5 MG TABS tablet Take 1 tablet (2.5 mg total) by mouth 2 (two) times daily. 60 tablet 3   Ascorbic Acid (VITAMIN C) 1000 MG tablet Take 1,000 mg by mouth daily.     Cholecalciferol (VITAMIN D3) 50 MCG (2000 UT) TABS Take by mouth daily at 6 (six) AM.     clotrimazole-betamethasone (LOTRISONE) cream Apply 1 Application topically 2 (two) times daily. 45 g 2   EPINEPHrine (EPIPEN 2-PAK) 0.3 mg/0.3  mL IJ SOAJ injection Inject 0.3 mg into the muscle once as needed for  anaphylaxis (allergic reaction). 2 each 1   fluticasone-salmeterol (ADVAIR) 100-50 MCG/ACT AEPB Inhale 1 puff into the lungs 2 (two) times daily. 1 each 3   GARLIC PO Take by mouth.     ketoconazole (NIZORAL) 2 % cream Apply cream to affect area BID 30 g 1   methocarbamol (ROBAXIN) 500 MG tablet Take by mouth every 6 (six) hours as needed for muscle spasms.     omeprazole (PRILOSEC) 40 MG capsule Take 1 capsule (40 mg total) by mouth daily. 30 capsule 5   triamcinolone (NASACORT) 55 MCG/ACT AERO nasal inhaler Place 1 spray into the nose daily. 16.9 g 5   triamcinolone cream (KENALOG) 0.1 % Apply thin amount bid prn to spots that itch 15 g 1   TRUE METRIX BLOOD GLUCOSE TEST test strip SMARTSIG:Via Meter     TRUEplus Lancets 33G MISC      vitamin E 180 MG (400 UNITS) capsule Take 400 Units by mouth daily.     Zinc 50 MG CAPS Take by mouth daily at 6 (six) AM.     Current Facility-Administered Medications  Medication Dose Route Frequency Provider Last Rate Last Admin   Benralizumab SOSY 30 mg  30 mg Subcutaneous Q28 days Alfonse Spruce, MD   30 mg at 10/18/20 1542    PHYSICAL EXAMINATION:  ECOG PERFORMANCE STATUS: 0 - Asymptomatic   There were no vitals filed for this visit.    There were no vitals filed for this visit.     Physical Exam Vitals and nursing note reviewed.  Constitutional:      General: She is not in acute distress.    Appearance: Normal appearance. She is obese. She is not ill-appearing, toxic-appearing or diaphoretic.     Comments: Here alone.  Seated in a chair.    HENT:     Head: Normocephalic and atraumatic.     Right Ear: External ear normal.     Left Ear: External ear normal.     Nose: Nose normal. No congestion or rhinorrhea.  Eyes:     General: No scleral icterus.    Extraocular Movements: Extraocular movements intact.     Conjunctiva/sclera: Conjunctivae normal.     Pupils: Pupils are equal, round, and reactive to light.  Cardiovascular:      Rate and Rhythm: Normal rate.     Heart sounds: No murmur heard.    No friction rub. No gallop.  Pulmonary:     Effort: Pulmonary effort is normal. No respiratory distress.     Breath sounds: Normal breath sounds. No stridor. No wheezing or rhonchi.  Abdominal:     General: Bowel sounds are normal. There is no distension.     Palpations: Abdomen is soft. There is no mass.     Tenderness: There is no abdominal tenderness. There is no guarding or rebound.     Hernia: No hernia is present.  Musculoskeletal:        General: No swelling, tenderness or deformity.     Cervical back: Normal range of motion and neck supple. No rigidity or tenderness.     Right lower leg: No edema.     Left lower leg: No edema.  Lymphadenopathy:     Head:     Right side of head: No submental, submandibular, tonsillar, preauricular, posterior auricular or occipital adenopathy.     Left side of head: No submental, submandibular, tonsillar, preauricular,  posterior auricular or occipital adenopathy.     Cervical: No cervical adenopathy.     Right cervical: No superficial, deep or posterior cervical adenopathy.    Left cervical: No superficial, deep or posterior cervical adenopathy.     Upper Body:     Right upper body: No supraclavicular, axillary, pectoral or epitrochlear adenopathy.     Left upper body: No supraclavicular, axillary, pectoral or epitrochlear adenopathy.  Skin:    General: Skin is warm.     Coloration: Skin is not jaundiced or pale.     Findings: No erythema, lesion or rash.  Neurological:     General: No focal deficit present.     Mental Status: She is alert and oriented to person, place, and time. Mental status is at baseline.     Cranial Nerves: No cranial nerve deficit.  Psychiatric:        Mood and Affect: Mood normal.        Behavior: Behavior normal.        Thought Content: Thought content normal.        Judgment: Judgment normal.     LABORATORY DATA: I have personally reviewed  the data as listed:  No visits with results within 1 Month(s) from this visit.  Latest known visit with results is:  Appointment on 08/31/2022  Component Date Value Ref Range Status   D-Dimer, Quant 08/31/2022 1.63 (H)  0.00 - 0.50 ug/mL-FEU Final   Comment: (NOTE) At the manufacturer cut-off value of 0.5 g/mL FEU, this assay has a negative predictive value of 95-100%.This assay is intended for use in conjunction with a clinical pretest probability (PTP) assessment model to exclude pulmonary embolism (PE) and deep venous thrombosis (DVT) in outpatients suspected of PE or DVT. Results should be correlated with clinical presentation. Performed at [facility], [address], [place], [place] [address]    Sodium 08/31/2022 141  135 - 145 mmol/L Final   Potassium 08/31/2022 3.7  3.5 - 5.1 mmol/L Final   Chloride 08/31/2022 106  98 - 111 mmol/L Final   CO2 08/31/2022 30  22 - 32 mmol/L Final   Glucose, Bld 08/31/2022 92  70 - 99 mg/dL Final   Glucose reference range applies only to samples taken after fasting for at least 8 hours.   BUN 08/31/2022 10  6 - 20 mg/dL Final   Creatinine, Ser 08/31/2022 0.61  0.44 - 1.00 mg/dL Final   Calcium 60/45/4098 9.3  8.9 - 10.3 mg/dL Final   Total Protein 11/91/4782 7.4  6.5 - 8.1 g/dL Final   Albumin 95/62/1308 4.1  3.5 - 5.0 g/dL Final   AST 65/78/4696 14 (L)  15 - 41 U/L Final   ALT 08/31/2022 15  0 - 44 U/L Final   Alkaline Phosphatase 08/31/2022 84  38 - 126 U/L Final   Total Bilirubin 08/31/2022 0.8  0.3 - 1.2 mg/dL Final   GFR, Estimated 08/31/2022 >60  >60 mL/min Final   Comment: (NOTE) Calculated using the CKD-EPI Creatinine Equation (2021)    Anion gap 08/31/2022 5  5 - 15 Final   Performed at [facility], [address] [place], [place] [address]   WBC 08/31/2022 4.7  4.0 - 10.5 K/uL Final   RBC 08/31/2022 4.59  3.87 - 5.11 MIL/uL Final   Hemoglobin 08/31/2022 13.5  12.0 - 15.0 g/dL  Final   HCT 41/32/4401 40.5  36.0 - 46.0 % Final   MCV 08/31/2022 88.2  80.0 -  100.0 fL Final   MCH 08/31/2022 29.4  26.0 - 34.0 pg Final   MCHC 08/31/2022 33.3  30.0 - 36.0 g/dL Final   RDW 47/42/5956 15.0  11.5 - 15.5 % Final   Platelets 08/31/2022 277  150 - 400 K/uL Final   nRBC 08/31/2022 0.0  0.0 - 0.2 % Final   Neutrophils Relative % 08/31/2022 46  % Final   Neutro Abs 08/31/2022 2.2  1.7 - 7.7 K/uL Final   Lymphocytes Relative 08/31/2022 39  % Final   Lymphs Abs 08/31/2022 1.8  0.7 - 4.0 K/uL Final   Monocytes Relative 08/31/2022 7  % Final   Monocytes Absolute 08/31/2022 0.3  0.1 - 1.0 K/uL Final   Eosinophils Relative 08/31/2022 7  % Final   Eosinophils Absolute 08/31/2022 0.3  0.0 - 0.5 K/uL Final   Basophils Relative 08/31/2022 1  % Final   Basophils Absolute 08/31/2022 0.0  0.0 - 0.1 K/uL Final   Immature Granulocytes 08/31/2022 0  % Final   Abs Immature Granulocytes 08/31/2022 0.01  0.00 - 0.07 K/uL Final   Performed at [facility], [address], [place], [place] [address]    RADIOGRAPHIC STUDIES: I have personally reviewed the radiological images as listed and agree with the findings in the report  No results found.  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  VTE diagnosed in July 2023:  Patient presented to ED with SOB as well as pain and swelling in distal RLE.  CT PA showed small filling defect in a right upper lobe subsegmental artery suggesting small pulmonary embolism. LE U/S showed occlusive thrombus in the right peroneal vein.  Patient was placed on Xarelto in the ED and appropriately discharged home.    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  These have been in the form of surgery and childbirth.  The time frame of RLE injury makes it unlikely to be a major contributing factor.   The road trip to [place] is also not likely a major factory.  Her main risk factor is likely obesity.  It is reasonable to classify this this as an unprovoked event.  Asthma is not considered a risk factor for VTE  Duration of anticoagulation:   July 27 2022:  HERDOO2 score 2 .  7.4% risk of major VTE per 100 patient years.  Not low.  Continuation of anticoagulation recommended per that model.  She is not anemic, iron stores normal.  D dimer remains elevated therefore full dose anticoagulation is reasonable   August 31 2022- Hgb 13.5 D dimer 1.63.  Remains on Eliquis 2.5 mg po bid which should be continued December 07 2022- Hgb 14.1 PLT 251 Cr 0.59 D dimer 1.16   Continue Eliquis 2.5 mg bid  Symptom management July 09 2022- Presented to ED with numbness across lower abdomen and nonproductive cough.  She attributed those symptoms to anticoagulation July 27 2022- Advised patient to call if she has questions about anticoagulation medications so as to avoid need for ED visit August 31 2022- Can consider compression stockings for LE edema which may be related in part to history of DVT  Abdominal pain  August 31 2022- Recommended that patient contact GI if this persists   Cancer Staging  No matching staging information was found for the patient.  No problem-specific Assessment & Plan notes found for this encounter.   No orders of the defined types were placed in this encounter.  20 minutes was spent in patient care.  This included time spent preparing to see the patient (e.g., review of tests), obtaining and/or reviewing separately obtained history, counseling and educating the patient, ordering medications, tests  documenting clinical information in the electronic or other health record, independently interpreting results and communicating results to the patient/family/caregiver as well as coordination of care.      All questions were answered. The patient knows to call the clinic with any  problems, questions or concerns.  30 minutes spent in face to face contact with patient.    This note was electronically signed.    Loni Muse, MD  12/05/2022 11:10 AM

## 2022-12-07 ENCOUNTER — Telehealth: Payer: Self-pay

## 2022-12-07 ENCOUNTER — Other Ambulatory Visit: Payer: Self-pay

## 2022-12-07 ENCOUNTER — Inpatient Hospital Stay (HOSPITAL_BASED_OUTPATIENT_CLINIC_OR_DEPARTMENT_OTHER): Payer: Medicare HMO | Admitting: Oncology

## 2022-12-07 ENCOUNTER — Inpatient Hospital Stay: Payer: Medicare HMO | Attending: Oncology

## 2022-12-07 VITALS — BP 129/77 | HR 76 | Temp 97.5°F | Resp 22 | Wt 198.3 lb

## 2022-12-07 DIAGNOSIS — E559 Vitamin D deficiency, unspecified: Secondary | ICD-10-CM

## 2022-12-07 DIAGNOSIS — E785 Hyperlipidemia, unspecified: Secondary | ICD-10-CM

## 2022-12-07 DIAGNOSIS — Z5181 Encounter for therapeutic drug level monitoring: Secondary | ICD-10-CM

## 2022-12-07 DIAGNOSIS — Z86718 Personal history of other venous thrombosis and embolism: Secondary | ICD-10-CM | POA: Insufficient documentation

## 2022-12-07 DIAGNOSIS — Z7901 Long term (current) use of anticoagulants: Secondary | ICD-10-CM | POA: Diagnosis not present

## 2022-12-07 DIAGNOSIS — Z86711 Personal history of pulmonary embolism: Secondary | ICD-10-CM | POA: Insufficient documentation

## 2022-12-07 DIAGNOSIS — R040 Epistaxis: Secondary | ICD-10-CM | POA: Insufficient documentation

## 2022-12-07 DIAGNOSIS — I82411 Acute embolism and thrombosis of right femoral vein: Secondary | ICD-10-CM | POA: Diagnosis not present

## 2022-12-07 DIAGNOSIS — Z8616 Personal history of COVID-19: Secondary | ICD-10-CM | POA: Diagnosis not present

## 2022-12-07 DIAGNOSIS — K5730 Diverticulosis of large intestine without perforation or abscess without bleeding: Secondary | ICD-10-CM | POA: Diagnosis not present

## 2022-12-07 LAB — CBC WITH DIFFERENTIAL/PLATELET
Abs Immature Granulocytes: 0.02 10*3/uL (ref 0.00–0.07)
Basophils Absolute: 0.1 10*3/uL (ref 0.0–0.1)
Basophils Relative: 1 %
Eosinophils Absolute: 0.5 10*3/uL (ref 0.0–0.5)
Eosinophils Relative: 9 %
HCT: 42.4 % (ref 36.0–46.0)
Hemoglobin: 14.1 g/dL (ref 12.0–15.0)
Immature Granulocytes: 0 %
Lymphocytes Relative: 36 %
Lymphs Abs: 2 10*3/uL (ref 0.7–4.0)
MCH: 29.7 pg (ref 26.0–34.0)
MCHC: 33.3 g/dL (ref 30.0–36.0)
MCV: 89.3 fL (ref 80.0–100.0)
Monocytes Absolute: 0.4 10*3/uL (ref 0.1–1.0)
Monocytes Relative: 8 %
Neutro Abs: 2.6 10*3/uL (ref 1.7–7.7)
Neutrophils Relative %: 46 %
Platelets: 251 10*3/uL (ref 150–400)
RBC: 4.75 MIL/uL (ref 3.87–5.11)
RDW: 14.6 % (ref 11.5–15.5)
WBC: 5.5 10*3/uL (ref 4.0–10.5)
nRBC: 0 % (ref 0.0–0.2)

## 2022-12-07 LAB — D-DIMER, QUANTITATIVE: D-Dimer, Quant: 1.16 ug/mL-FEU — ABNORMAL HIGH (ref 0.00–0.50)

## 2022-12-07 LAB — COMPREHENSIVE METABOLIC PANEL
ALT: 15 U/L (ref 0–44)
AST: 15 U/L (ref 15–41)
Albumin: 4.1 g/dL (ref 3.5–5.0)
Alkaline Phosphatase: 88 U/L (ref 38–126)
Anion gap: 7 (ref 5–15)
BUN: 12 mg/dL (ref 8–23)
CO2: 28 mmol/L (ref 22–32)
Calcium: 9.4 mg/dL (ref 8.9–10.3)
Chloride: 106 mmol/L (ref 98–111)
Creatinine, Ser: 0.59 mg/dL (ref 0.44–1.00)
GFR, Estimated: >60 mL/min (ref >60)
Glucose, Bld: 86 mg/dL (ref 70–99)
Potassium: 4 mmol/L (ref 3.5–5.1)
Sodium: 141 mmol/L (ref 135–145)
Total Bilirubin: 0.8 mg/dL (ref 0.3–1.2)
Total Protein: 7 g/dL (ref 6.5–8.1)

## 2022-12-07 MED ORDER — APIXABAN 2.5 MG PO TABS
2.5000 mg | ORAL_TABLET | Freq: Two times a day (BID) | ORAL | 3 refills | Status: DC
Start: 2022-12-07 — End: 2023-03-21

## 2022-12-07 NOTE — Telephone Encounter (Signed)
Lipid, vitamin D Hyperlipidemia, vitamin D deficiency She actually had labs through hematology that covered a lot of her other labs thank you

## 2022-12-07 NOTE — Telephone Encounter (Signed)
Pt needs blood work ordered for Phy in Sept

## 2022-12-07 NOTE — Telephone Encounter (Signed)
Called pt to inform that the labs have been put in but no answer left message

## 2022-12-18 NOTE — Progress Notes (Signed)
This encounter was created in error - please disregard.

## 2022-12-21 DIAGNOSIS — Z7901 Long term (current) use of anticoagulants: Secondary | ICD-10-CM | POA: Insufficient documentation

## 2023-01-02 ENCOUNTER — Other Ambulatory Visit: Payer: Self-pay

## 2023-01-30 ENCOUNTER — Encounter: Payer: Medicare HMO | Admitting: Family Medicine

## 2023-02-07 DIAGNOSIS — H35033 Hypertensive retinopathy, bilateral: Secondary | ICD-10-CM | POA: Diagnosis not present

## 2023-02-07 DIAGNOSIS — H524 Presbyopia: Secondary | ICD-10-CM | POA: Diagnosis not present

## 2023-02-22 ENCOUNTER — Telehealth: Payer: Self-pay

## 2023-02-22 NOTE — Telephone Encounter (Signed)
Called patient regarding upcoming October appointments that was rescheduled due to provider change, left a voicemail.

## 2023-02-27 ENCOUNTER — Ambulatory Visit: Payer: Medicare HMO | Admitting: Family Medicine

## 2023-02-27 VITALS — BP 138/88 | HR 87 | Temp 98.6°F | Ht 64.0 in | Wt 202.8 lb

## 2023-02-27 DIAGNOSIS — Z23 Encounter for immunization: Secondary | ICD-10-CM | POA: Diagnosis not present

## 2023-02-27 DIAGNOSIS — Z0001 Encounter for general adult medical examination with abnormal findings: Secondary | ICD-10-CM | POA: Diagnosis not present

## 2023-02-27 DIAGNOSIS — Z0000 Encounter for general adult medical examination without abnormal findings: Secondary | ICD-10-CM

## 2023-02-27 DIAGNOSIS — F439 Reaction to severe stress, unspecified: Secondary | ICD-10-CM | POA: Diagnosis not present

## 2023-02-27 DIAGNOSIS — M5432 Sciatica, left side: Secondary | ICD-10-CM | POA: Diagnosis not present

## 2023-02-27 MED ORDER — AMLODIPINE BESYLATE 5 MG PO TABS: 5.0000 mg | ORAL_TABLET | Freq: Every day | ORAL | 5 refills | Status: DC

## 2023-02-27 NOTE — Patient Instructions (Signed)

## 2023-02-27 NOTE — Progress Notes (Signed)
Subjective:    Patient ID: Stephanie [name], female    DOB: Oct 09, 1960, 62 y.o.   MRN: [id number]  HPI The patient comes in today for a wellness visit.    A review of their health history was completed.  A review of medications was also completed.  Any needed refills; No  Eating habits: Good  Falls/  MVA accidents in past few months: No  Regular exercise: Yes  Specialist pt sees on regular basis: Oncology and Allergist  Preventative health issues were discussed.   Additional concerns: No pap or breast exam needed Possible reaction to Eliquis see discussion below Back Pain see discussion below patient relates severe sciatica down the left leg has had previous MRI Spots on leg pt would like to have looked at small spots on the legs appear to be just some reddening in the skin I do not see any sign of any cancer Flowsheet Row Office Visit from 02/27/2023 in [facility]  PHQ-9 Total Score 15         02/27/2023    1:49 PM 10/19/2022   11:17 AM 08/23/2022    5:24 PM  Depression screen PHQ 2/9  Decreased Interest 1 1 1   Down, Depressed, Hopeless 1 1 1   PHQ - 2 Score 2 2 2   Altered sleeping 2 2 2   Tired, decreased energy 2 1 2   Change in appetite 2 1 2   Feeling bad or failure about yourself  1 1 1   Trouble concentrating 3 3 3   Moving slowly or fidgety/restless 3 1 2   Suicidal thoughts 0 0 0  PHQ-9 Score 15 11 14   Difficult doing work/chores Very difficult Somewhat difficult Very difficult   Positive straight leg raise on the left side no muscle weakness  Review of Systems     Objective:   Physical Exam  General-in no acute distress Eyes-no discharge Lungs-respiratory rate normal, CTA CV-no murmurs,RRR Extremities skin warm dry no edema Neuro grossly normal Behavior normal, alert       Assessment & Plan:  1. Well adult exam Adult wellness-complete.wellness physical was conducted today. Importance of diet and exercise were  discussed in detail.  Importance of stress reduction and healthy living were discussed.  In addition to this a discussion regarding safety was also covered.  We also reviewed over immunizations and gave recommendations regarding current immunization needed for age.   In addition to this additional areas were also touched on including: Preventative health exams needed:  Colonoscopy 2034  Patient was advised yearly wellness exam   2. Sciatica, left side Patient to do gentle stretches and give [place] feedback if not improving over the next 2 to 3 weeks patient does not want to be on any pain medicine currently.  She states she is using CBD which seems to help her for her pain she does not want to be on any narcotics  3. Immunization due Today - Flu vaccine trivalent PF, 6mos and older(Flulaval,Afluria,Fluarix,Fluzone)  4. Stress Under a lot of stress She is trying to help out her son who has drug issues Patient at times feels stressed and down but denies being depressed does not want to be on any type of antidepressants currently not suicidal  Hold off on any imaging of the back at this point  Patient does state that she has a funny feeling in her throat when she takes the Eliquis and she is concerned that she is developing an allergy she denies outright anaphylaxis or  angioedema-when she was on Xarelto she did have some bleeding issues and so was switched over to Eliquis and has done well on that  She is going to be seen in new hematologist in the coming weeks I have encouraged her to bring this up with them and they may be switching her over to Coumadin-just not sure at this point-I do not find what she describes as a life-threatening allergy and for now it would be reasonable to continue Eliquis unless it causes more serious issues

## 2023-03-01 ENCOUNTER — Other Ambulatory Visit: Payer: Self-pay

## 2023-03-01 DIAGNOSIS — I8290 Acute embolism and thrombosis of unspecified vein: Secondary | ICD-10-CM

## 2023-03-04 NOTE — Progress Notes (Unsigned)
[facility] CONSULT NOTE  Patient Care Team: Babs Sciara, MD as PCP - General (Family Medicine)   ASSESSMENT & PLAN:  62 y.o.female referred to [facility] for history of RLE DVT and PE. Stephanie [name] has history of arthritis, asthma, hypertension, obesity.  We had a long discussion on risk and benefit of blood thinner. She had a traumatic fall in April 2023 and long drive to [facility] in June and the event occurred in July. She is concerning about bleeding, and falling. She has diverticulosis and noted blood in stool a few times in her life. Her colonoscopy was in 08/2022. Since she had provoked event and is concerning about falling and bleeding, reasonable to consider discontinuation and watch for symptoms of DVT and PE. I counseled her on the symptoms and signs. After discussion she is comfortable with the plan.  First episode: 11/2021 distal RLE DVT and small PE  Setting: unprovoked Treatment: apixaban. Current dose: 2.5 mg twice daily.  May discontinue anticoagulation.  Patient education for risk factors and prevention of clotting We talked about modifiable risk factors.  Prevention of clotting like deep vein thrombosis including: Strong risk factors including fractures of lower limb, hospitalization for severe illness, such as heart failure, myocardial infarction, spinal cord injury, major trauma, hip or knee replacement, and previous VTE. avoid prolonged immobilization and moving extremities every 1-2 hours during long car rides or flights.  Taking a break and moving extremities if working in a job setting with prolonged sitting.   Avoid dehydration, especially in high altitude. Avoid cigarette smoking Avoid use of hormone replacement therapy, estrogen-containing contraceptive in women.   Maintaining healthy lifestyle to prevent development of diabetes.  Weight loss if BMI over 30.  Regular exercises but not extreme.   Other risk factors  for clotting are surgery, hospitalization, inflammatory disease or severe infection, trauma or injuries from inflammatory state and stasis. If developing one-sided leg swelling, pain, color change, chest pain, sudden short of breath, difficulty taking deep breaths, taking deep breath with chest discomfort or pain, dizziness or heart racing sensation, go to the emergency room immediately for evaluation. If developing trauma, uncontrolled bleeding, such as bloody stools report ED immediately. Avoid NSAIDs, aspirin while on blood thinner.  I have not made a return appointment for the patient to come back. I would be happy to assist in perioperative DVT management in the future should she need any elective procedures.  No orders of the defined types were placed in this encounter.  All questions were answered. The patient knows to call the clinic with any problems, questions or concerns. The total time spent in the appointment was 62 minutes encounter with patients including review of chart and various tests results, discussions about plan of care and coordination of care plan  Melven Sartorius, MD 10/22/202411:42 AM  CHIEF COMPLAINTS/PURPOSE OF CONSULTATION:  History of VTE  HISTORY OF PRESENTING ILLNESS:  Stephanie [name] 62 y.o. female is here because of VTE.   VTE HISTORY: May 08 2018:  CT PA negative for PE September 07 2020:  CT PA negative for PE November 28 2021:  Presented to [facility] with right leg pain/swelling and SOB.   CT PA Small filling defect in a right upper lobe subsegmental artery suggesting small pulmonary embolism. No evidence of right heart strain. Cardiomegaly. LE U/S showed occlusive thrombus in the right peroneal vein.   Patient was placed on Xarelto and discharged home from the  ED.    Report she had nosebleed and bloody in stool within a months of Xarelto. She was switched to apixaban.  December 01 2021:  Presented to [facility] with near syncope CT PA negative  for PE.     January 19 2022:  [facility]  INTERVAL HISTORY She may have missed a few dose. In retrospect, she fell in April of 2023 and twisted her legs.  Her right knee was swollen for about a few months. She was at home and while walking loss balance. She reports the fall was significant. Her body hit the wall and landed twisted. Report her knee was very swollen.  She denies persistent short of breath, she could not take a deep breath. No chest pain.  She reports bright red blood in the stool a once and not persistent. Last colonoscopy in May. No vaginal bleeding, hematuria or other bleeding. Throughout the years she noted a few bloody stool.  She had two hip replacement and had anticoagulation postop for 2 weeks.    No upcoming surgeries.  She reports falling and unsteady sometimes.  She denies aspirin or NSAIDs.   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   Procedure: RIGHT TOTAL HIP ARTHROPLASTY ANTERIOR APPROACH;  Surgeon: Ollen Gross, MD;  Location: [facility];  Service: Orthopedics;  Laterality: Right;   TOTAL HIP ARTHROPLASTY Left 08/17/2020   Procedure: TOTAL HIP ARTHROPLASTY ANTERIOR APPROACH;  Surgeon: Ollen Gross, MD;  Location: [facility];  Service: Orthopedics;  Laterality: Left;     SOCIAL HISTORY: Social History   Socioeconomic History   Marital status: Divorced    Spouse name: Not on file   Number of children: 3   Years of education: Not on file   Highest education level: Associate degree: occupational, Scientist, product/process development, or  vocational program  Occupational History   Occupation: retired  Tobacco Use   Smoking status: Former    Current packs/day: 0.00    Average packs/day: 0.3 packs/day for 18.4 years (4.6 ttl pk-yrs)    Types: Cigarettes    Start date: 09/24/1994    Quit date: 03/06/2013    Years since quitting: 10.0   Smokeless tobacco: Never  Vaping Use   Vaping status: Never Used  Substance and Sexual Activity   Alcohol use: Not Currently    Alcohol/week: 2.0 - 3.0 standard drinks of alcohol    Types: 2 - 3 Glasses of wine per week   Drug use: No   Sexual activity: Not Currently    Birth control/protection: Post-menopausal  Other Topics Concern   Not on file  Social History Narrative   2 daughters and 1 son, visit frequently per pt.    Social Determinants of Health   Financial Resource Strain: High Risk (08/23/2022)   Overall Financial Resource Strain (CARDIA)    Difficulty of Paying Living Expenses: Hard  Food Insecurity: No Food Insecurity (08/23/2022)   Hunger Vital Sign    Worried  About Running Out of Food in the Last Year: Never true    Ran Out of Food in the Last Year: Never true  Transportation Needs: No Transportation Needs (08/23/2022)   PRAPARE - Administrator, Civil Service (Medical): No    Lack of Transportation (Non-Medical): No  Physical Activity: Insufficiently Active (08/23/2022)   Exercise Vital Sign    Days of Exercise per Week: 3 days    Minutes of Exercise per Session: 10 min  Stress: Stress Concern Present (08/23/2022)   [employer] of Occupational Health - Occupational Stress Questionnaire    Feeling of Stress : Rather much  Social Connections: Moderately Isolated (08/23/2022)   Social Connection and Isolation Panel [NHANES]    Frequency of Communication with Friends and Family: Twice a week    Frequency of Social Gatherings with Friends and Family: Once a week    Attends Religious Services: 1 to 4 times per year    Active Member of [employer] or  Organizations: No    Attends Engineer, structural: Not on file    Marital Status: Divorced  Intimate Partner Violence: Not At Risk (07/13/2021)   Received from [facility]   Humiliation, Afraid, Rape, and Kick questionnaire    FAMILY HISTORY: Family History  Problem Relation Age of Onset   Hypertension Mother    Hypertension Father    Stomach cancer Niece    Breast cancer Niece    Colon cancer Neg Hx    Esophageal cancer Neg Hx    Rectal cancer Neg Hx     ALLERGIES:  is allergic to amoxicillin, aspirin, other, hydrocodone, latex, oxycodone, and levaquin [levofloxacin].  MEDICATIONS:  Current Outpatient Medications  Medication Sig Dispense Refill   albuterol (PROVENTIL HFA) 108 (90 Base) MCG/ACT inhaler Inhale 2 puffs into the lungs every 4 (four) hours as needed for wheezing or shortness of breath. 18 g 5   albuterol (PROVENTIL) (2.5 MG/3ML) 0.083% nebulizer solution TAKE 3ml BY NEBULIZER EVERY 4 HOURS AS NEEDED FOR WHEEZING OR SHORTNESS OF BREATH (Patient taking differently: Take 2.5 mg by nebulization every 4 (four) hours as needed for shortness of breath or wheezing.) 180 mL 1   amLODipine (NORVASC) 5 MG tablet Take 1 tablet (5 mg total) by mouth daily. 30 tablet 5   apixaban (ELIQUIS) 2.5 MG TABS tablet Take 1 tablet (2.5 mg total) by mouth 2 (two) times daily. 60 tablet 3   Ascorbic Acid (VITAMIN C) 1000 MG tablet Take 1,000 mg by mouth daily.     Cholecalciferol (VITAMIN D3) 50 MCG (2000 UT) TABS Take by mouth daily at 6 (six) AM.     clotrimazole-betamethasone (LOTRISONE) cream Apply 1 Application topically 2 (two) times daily. 45 g 2   EPINEPHrine (EPIPEN 2-PAK) 0.3 mg/0.3 mL IJ SOAJ injection Inject 0.3 mg into the muscle once as needed for anaphylaxis (allergic reaction). 2 each 1   GARLIC PO Take by mouth.     ketoconazole (NIZORAL) 2 % cream Apply cream to affect area BID 30 g 1   methocarbamol (ROBAXIN) 500 MG tablet Take by mouth every 6 (six) hours as  needed for muscle spasms.     omeprazole (PRILOSEC) 40 MG capsule Take 1 capsule (40 mg total) by mouth daily. 30 capsule 5   triamcinolone (NASACORT) 55 MCG/ACT AERO nasal inhaler Place 1 spray into the nose daily. 16.9 g 5   triamcinolone cream (KENALOG) 0.1 % Apply thin amount bid prn to spots that itch 15 g  1   TRUE METRIX BLOOD GLUCOSE TEST test strip SMARTSIG:Via Meter     TRUEplus Lancets 33G MISC      vitamin E 180 MG (400 UNITS) capsule Take 400 Units by mouth daily.     Zinc 50 MG CAPS Take by mouth daily at 6 (six) AM.     Current Facility-Administered Medications  Medication Dose Route Frequency Provider Last Rate Last Admin   Benralizumab SOSY 30 mg  30 mg Subcutaneous Q28 days Alfonse Spruce, MD   30 mg at 10/18/20 1542    REVIEW OF SYSTEMS:   Constitutional: Denies fevers Respiratory: Denies cough, shortness of breath or wheezes Cardiovascular: Denies palpitation, chest discomfort or heart racing sensation Gastrointestinal:  Denies abdominal pain  PHYSICAL EXAMINATION:  Vitals:   03/05/23 1011  BP: (!) 125/91  Pulse: 83  Resp: 16  Temp: 98.1 F (36.7 C)  SpO2: 98%   Filed Weights   03/05/23 1011  Weight: 201 lb 9.6 oz (91.4 kg)    GENERAL: alert, no distress and comfortable LUNGS: Effort normal and no respiratory distress.   Musculoskeletal: no cyanosis and edema.  Bilateral lower extremity minimal edema at ankles.  LABORATORY DATA:  I have reviewed the data as listed   RADIOGRAPHIC STUDIES: I have personally reviewed the radiological images as listed and agreed with the findings in the report. No results found.

## 2023-03-05 ENCOUNTER — Inpatient Hospital Stay: Payer: Medicare HMO | Attending: Oncology

## 2023-03-05 ENCOUNTER — Inpatient Hospital Stay (HOSPITAL_BASED_OUTPATIENT_CLINIC_OR_DEPARTMENT_OTHER): Payer: Medicare HMO

## 2023-03-05 VITALS — BP 125/91 | HR 83 | Temp 98.1°F | Resp 16 | Ht 64.0 in | Wt 201.6 lb

## 2023-03-05 DIAGNOSIS — I119 Hypertensive heart disease without heart failure: Secondary | ICD-10-CM | POA: Diagnosis not present

## 2023-03-05 DIAGNOSIS — Z86711 Personal history of pulmonary embolism: Secondary | ICD-10-CM | POA: Insufficient documentation

## 2023-03-05 DIAGNOSIS — Z7901 Long term (current) use of anticoagulants: Secondary | ICD-10-CM | POA: Diagnosis not present

## 2023-03-05 DIAGNOSIS — E669 Obesity, unspecified: Secondary | ICD-10-CM | POA: Insufficient documentation

## 2023-03-05 DIAGNOSIS — I8290 Acute embolism and thrombosis of unspecified vein: Secondary | ICD-10-CM

## 2023-03-05 DIAGNOSIS — E785 Hyperlipidemia, unspecified: Secondary | ICD-10-CM | POA: Diagnosis not present

## 2023-03-05 DIAGNOSIS — J45909 Unspecified asthma, uncomplicated: Secondary | ICD-10-CM | POA: Diagnosis not present

## 2023-03-05 DIAGNOSIS — Z86718 Personal history of other venous thrombosis and embolism: Secondary | ICD-10-CM | POA: Insufficient documentation

## 2023-03-05 DIAGNOSIS — E559 Vitamin D deficiency, unspecified: Secondary | ICD-10-CM | POA: Diagnosis not present

## 2023-03-05 DIAGNOSIS — M1990 Unspecified osteoarthritis, unspecified site: Secondary | ICD-10-CM | POA: Insufficient documentation

## 2023-03-05 LAB — CMP (CANCER CENTER ONLY)
ALT: 15 U/L (ref 0–44)
AST: 16 U/L (ref 15–41)
Albumin: 4.3 g/dL (ref 3.5–5.0)
Alkaline Phosphatase: 85 U/L (ref 38–126)
Anion gap: 7 (ref 5–15)
BUN: 11 mg/dL (ref 8–23)
CO2: 27 mmol/L (ref 22–32)
Calcium: 9.4 mg/dL (ref 8.9–10.3)
Chloride: 106 mmol/L (ref 98–111)
Creatinine: 0.68 mg/dL (ref 0.44–1.00)
GFR, Estimated: >60 mL/min (ref >60)
Glucose, Bld: 88 mg/dL (ref 70–99)
Potassium: 3.9 mmol/L (ref 3.5–5.1)
Sodium: 140 mmol/L (ref 135–145)
Total Bilirubin: 0.8 mg/dL (ref 0.3–1.2)
Total Protein: 7.5 g/dL (ref 6.5–8.1)

## 2023-03-05 LAB — CBC WITH DIFFERENTIAL (CANCER CENTER ONLY)
Abs Immature Granulocytes: 0 10*3/uL (ref 0.00–0.07)
Basophils Absolute: 0.1 10*3/uL (ref 0.0–0.1)
Basophils Relative: 1 %
Eosinophils Absolute: 0.4 10*3/uL (ref 0.0–0.5)
Eosinophils Relative: 8 %
HCT: 42.7 % (ref 36.0–46.0)
Hemoglobin: 14.5 g/dL (ref 12.0–15.0)
Immature Granulocytes: 0 %
Lymphocytes Relative: 35 %
Lymphs Abs: 1.6 10*3/uL (ref 0.7–4.0)
MCH: 30.1 pg (ref 26.0–34.0)
MCHC: 34 g/dL (ref 30.0–36.0)
MCV: 88.6 fL (ref 80.0–100.0)
Monocytes Absolute: 0.4 10*3/uL (ref 0.1–1.0)
Monocytes Relative: 9 %
Neutro Abs: 2.3 10*3/uL (ref 1.7–7.7)
Neutrophils Relative %: 47 %
Platelet Count: 258 10*3/uL (ref 150–400)
RBC: 4.82 MIL/uL (ref 3.87–5.11)
RDW: 14.6 % (ref 11.5–15.5)
WBC Count: 4.7 10*3/uL (ref 4.0–10.5)
nRBC: 0 % (ref 0.0–0.2)

## 2023-03-05 LAB — D-DIMER, QUANTITATIVE: D-Dimer, Quant: 1.31 ug{FEU}/mL — ABNORMAL HIGH (ref 0.00–0.50)

## 2023-03-05 NOTE — Patient Instructions (Addendum)
We had a long discussion on risk and benefit of blood thinner. She had a traumatic fall in April 2023 and long drive to [facility] in June and the event occurred in July. She is concerning about bleeding, and falling. She has diverticulosis and noted blood in stool a few times in her life. Her colonoscopy was in 08/2022. Since she had provoked event and is concerning about falling and bleeding, reasonable to consider discontinuation. After discussion she is comfortable with the plan.  May discontinue anticoagulation.  Patient education for risk factors and prevention of clotting We talked about modifiable risk factors.  Prevention of clotting like deep vein thrombosis including: Strong risk factors including fractures of lower limb, hospitalization for severe illness, such as heart failure, myocardial infarction, spinal cord injury, major trauma, hip or knee replacement, and previous blood clot. avoid prolonged immobilization and moving extremities every 1-2 hours during long car rides or flights.  Taking a break and moving extremities if working in a job setting with prolonged sitting.   Avoid dehydration, especially in high altitude. Avoid cigarette smoking Avoid use of hormone replacement therapy, estrogen-containing contraceptive in women.   Maintaining healthy lifestyle to prevent development of diabetes.  Weight loss if BMI over 30.  Regular exercises but not extreme.   Other risk factors for clotting are surgery, hospitalization, inflammatory disease or severe infection, trauma or injuries from inflammatory state and stasis. If developing one-sided leg swelling, pain, color change, chest pain, sudden short of breath, difficulty taking deep breaths, taking deep breath with chest discomfort or pain, dizziness or heart racing sensation, go to the emergency room immediately for evaluation. If developing trauma, uncontrolled bleeding, such as bloody stools report ED immediately. Avoid NSAIDs,  aspirin while on blood thinner.  I have not made a return appointment for the patient to come back. I would be happy to assist in perioperative DVT management in the future should she need any elective procedures.

## 2023-03-06 LAB — LIPID PANEL
Chol/HDL Ratio: 2.3 {ratio} (ref 0.0–4.4)
Cholesterol, Total: 170 mg/dL (ref 100–199)
HDL: 74 mg/dL (ref >39)
LDL Chol Calc (NIH): 74 mg/dL (ref 0–99)
Triglycerides: 126 mg/dL (ref 0–149)
VLDL Cholesterol Cal: 22 mg/dL (ref 5–40)

## 2023-03-06 LAB — VITAMIN D 25 HYDROXY (VIT D DEFICIENCY, FRACTURES): Vit D, 25-Hydroxy: 29.4 ng/mL — ABNORMAL LOW (ref 30.0–100.0)

## 2023-03-08 ENCOUNTER — Ambulatory Visit: Payer: Medicare HMO | Admitting: Oncology

## 2023-03-08 ENCOUNTER — Other Ambulatory Visit: Payer: Medicare HMO

## 2023-03-21 ENCOUNTER — Ambulatory Visit: Payer: Medicare HMO | Admitting: Family Medicine

## 2023-03-21 VITALS — BP 116/80 | HR 81 | Temp 97.5°F | Ht 64.0 in | Wt 206.2 lb

## 2023-03-21 DIAGNOSIS — M5432 Sciatica, left side: Secondary | ICD-10-CM | POA: Diagnosis not present

## 2023-03-21 MED ORDER — GABAPENTIN 100 MG PO CAPS
ORAL_CAPSULE | ORAL | 3 refills | Status: DC
Start: 2023-03-21 — End: 2023-06-04

## 2023-03-21 NOTE — Progress Notes (Signed)
   Subjective:    Patient ID: Stephanie [name], female    DOB: 09-09-1960, 62 y.o.   MRN: [id number]  Discussed the use of AI scribe software for clinical note transcription with the patient, who gave verbal consent to proceed.  History of Present Illness   The patient, with a history of blood clots and sciatica, reports a recent visit to a new hematologist who decided to discontinue their blood thinner medication. The patient was educated on the risks and benefits of this decision and feels comfortable with it. They report overall good health but acknowledge ongoing struggles with their medication regimen and stress levels.  The patient's primary complaint is chronic sciatica, which has been a lifelong issue. They describe the pain as "cutting up" and it has recently returned. They have been managing the pain with CBD, which provides some relief but not complete resolution. They express uncertainty about long-term solutions but are open to trying new treatments. They have a history of using gabapentin, which they believe may have provided some relief in the past, and are open to trying it again.  The patient also mentions the use of support hose for their history of blood clots and inquires about obtaining a prescription for them. They express a desire to manage their health conditions and are proactive in seeking solutions.         Review of Systems     Objective:    Physical Exam   CHEST: Lungs clear to auscultation. CARDIOVASCULAR: Heart sounds normal.           Assessment & Plan:  Assessment and Plan    Anticoagulation History of blood clots, recently discontinued blood thinner by hematologist due to risk of bleeding outweighing benefit. Patient educated on signs of clotting and bleeding. -No changes to current plan as per hematologist's recommendation.  Sciatica Chronic issue, currently exacerbated. Patient using CBD for relief with partial success. No interest in seeing a  back specialist or using narcotics. MRI earlier this year showed no significant herniated disc. -Start Gabapentin at a low dose and gradually titrate up, with patient to provide feedback via MyChart. -Encourage patient to continue with CBD if it provides some relief.  Compression Stockings Patient inquiring about prescription for knee-high surgical support hose due to history of blood clots. -Write prescription for knee-high surgical support hose. Instruct patient to contact [facility] for coverage and supply details.     Patient working her way through the stress related issues Patient was counseled regarding the gabapentin if it causes drowsiness stop the medication No driving if feeling drowsy  Follow-up within 3 months

## 2023-03-21 NOTE — Patient Instructions (Signed)
As for the gabapentin Gabapentin 100 mg Start off taking 1 in the evening for the first 3 days then 1 twice daily for 3 days then 1 taken 3 times daily After 7 days of staying at the 1 3 times daily if that has not helped enough I would recommend increasing it gradually You could start off at that point going with 1 in the morning, 1 midday, 2 in the evening 5 days later if still not enough relief 1 in the morning 2 in the middle of the day 2 in the evening  5 days later is still not enough relief 2 in the morning 2 in the middle of the day and 2 in the evening  Please send me a MyChart message within the next 2 to 3 weeks give me an update on how that is helping  If the medication causes excessive drowsiness it is best to reduce the dose if you feel you are having any trouble with the medicine to stop please notify us if any problems

## 2023-05-13 ENCOUNTER — Ambulatory Visit: Payer: Medicare HMO | Admitting: Podiatry

## 2023-05-13 DIAGNOSIS — H524 Presbyopia: Secondary | ICD-10-CM | POA: Diagnosis not present

## 2023-05-22 ENCOUNTER — Ambulatory Visit: Payer: Medicare HMO | Admitting: Podiatry

## 2023-06-03 ENCOUNTER — Other Ambulatory Visit: Payer: Self-pay

## 2023-06-03 ENCOUNTER — Emergency Department (HOSPITAL_COMMUNITY): Payer: Medicare HMO

## 2023-06-03 ENCOUNTER — Encounter (HOSPITAL_COMMUNITY): Payer: Self-pay

## 2023-06-03 ENCOUNTER — Emergency Department (HOSPITAL_COMMUNITY)
Admission: EM | Admit: 2023-06-03 | Discharge: 2023-06-04 | Disposition: A | Discharge status: Home / Self Care | Payer: Medicare HMO | Attending: Emergency Medicine | Admitting: Emergency Medicine

## 2023-06-03 DIAGNOSIS — Z0389 Encounter for observation for other suspected diseases and conditions ruled out: Secondary | ICD-10-CM | POA: Diagnosis not present

## 2023-06-03 DIAGNOSIS — Z20822 Contact with and (suspected) exposure to covid-19: Secondary | ICD-10-CM | POA: Insufficient documentation

## 2023-06-03 DIAGNOSIS — J45901 Unspecified asthma with (acute) exacerbation: Secondary | ICD-10-CM | POA: Diagnosis not present

## 2023-06-03 DIAGNOSIS — R071 Chest pain on breathing: Secondary | ICD-10-CM | POA: Diagnosis not present

## 2023-06-03 DIAGNOSIS — R0600 Dyspnea, unspecified: Secondary | ICD-10-CM | POA: Diagnosis not present

## 2023-06-03 DIAGNOSIS — R0602 Shortness of breath: Secondary | ICD-10-CM | POA: Diagnosis not present

## 2023-06-03 DIAGNOSIS — R062 Wheezing: Secondary | ICD-10-CM | POA: Diagnosis not present

## 2023-06-03 MED ORDER — IPRATROPIUM-ALBUTEROL 0.5-2.5 (3) MG/3ML IN SOLN
3.0000 mL | Freq: Once | RESPIRATORY_TRACT | Status: AC
  Administered 2023-06-04: 3 mL via RESPIRATORY_TRACT
  Filled 2023-06-03: qty 3

## 2023-06-03 NOTE — ED Triage Notes (Signed)
Shortness of breath, wheezing, chest tightness started last night.  Patient states her temp was 100.1.  She has hx of PE and states sx are similar

## 2023-06-03 NOTE — ED Provider Notes (Signed)
[facility]-EMERGENCY DEPT [facility] Emergency Department Provider Note MRN:  [id number]  Arrival date & time: 06/04/23     Chief Complaint   Shortness of Breath and Chest Pain   History of Present Illness   Stephanie [name] is a 63 y.o. year-old female presents to the ED with chief complaint of show as of breath and wheezing.  She reports history of asthma.  Also reports history of PE.  She states that a family member is sick with similar symptoms.  She reports low-grade fever to 100.1.  She states that she used her nebulizer and had improvement of the wheezing.  She still has some chest tightness and cough.  She is no longer anticoagulated for PE.Marland Kitchen  History provided by patient.   Review of Systems  Pertinent positive and negative review of systems noted in HPI.    Physical Exam   Vitals:   06/04/23 0030 06/04/23 0100  BP: (!) 147/92 (!) 150/94  Pulse: 92 89  Resp: (!) 24 (!) 22  Temp:    SpO2: 95% 94%    CONSTITUTIONAL:  non toxic-appearing, NAD NEURO:  Alert and oriented x 3, CN 3-12 grossly intact EYES:  eyes equal and reactive ENT/NECK:  Supple, no stridor  CARDIO:  tachycardic, regular rhythm, appears well-perfused  PULM:  No respiratory distress, CTAB GI/GU:  non-distended,  MSK/SPINE:  No gross deformities, no edema, moves all extremities  SKIN:  no rash, atraumatic   *Additional and/or pertinent findings included in MDM below  Diagnostic and Interventional Summary    EKG Interpretation Date/Time:  Monday June 03 2023 23:23:32 EST Ventricular Rate:  98 PR Interval:  168 QRS Duration:  88 QT Interval:  337 QTC Calculation: 431 R Axis:   -44  Text Interpretation: Sinus rhythm Probable left atrial enlargement Left axis deviation Abnormal R-wave progression, late transition Since last tracing rate faster Otherwise no significant change Confirmed by Melene Plan [phone]) on 06/03/2023 11:54:42 PM       Labs Reviewed  BASIC METABOLIC PANEL - Abnormal;  Notable for the following components:      Result Value   Glucose, Bld 109 (*)    BUN 7 (*)    All other components within normal limits  D-DIMER, QUANTITATIVE - Abnormal; Notable for the following components:   D-Dimer, Quant 2.35 (*)    All other components within normal limits  RESP PANEL BY RT-PCR (RSV, FLU A&B, COVID)  RVPGX2  CBC  TROPONIN I (HIGH SENSITIVITY)  TROPONIN I (HIGH SENSITIVITY)    CT Angio Chest PE W and/or Wo Contrast  Final Result    DG Chest 2 View  Final Result      Medications  albuterol (VENTOLIN HFA) 108 (90 Base) MCG/ACT inhaler 2 puff (has no administration in time range)  ipratropium-albuterol (DUONEB) 0.5-2.5 (3) MG/3ML nebulizer solution 3 mL (3 mLs Nebulization Given 06/04/23 0010)  iohexol (OMNIPAQUE) 350 MG/ML injection 75 mL (75 mLs Intravenous Contrast Given 06/04/23 0119)  ipratropium-albuterol (DUONEB) 0.5-2.5 (3) MG/3ML nebulizer solution 3 mL (3 mLs Nebulization Given 06/04/23 0211)  methylPREDNISolone sodium succinate (SOLU-MEDROL) 125 mg/2 mL injection 125 mg (125 mg Intravenous Given 06/04/23 0211)     Procedures  /  Critical Care Procedures  ED Course and Medical Decision Making  I have reviewed the triage vital signs, the nursing notes, and pertinent available records from the EMR.  Social Determinants Affecting Complexity of Care: Patient has no clinically significant social determinants affecting this chief complaint..   ED Course:  Clinical Course as of 06/04/23 0318  Tue Jun 04, 2023  0318 Troponin I (High Sensitivity) Troponins are negative, doubt ACS [RB]  0318 CBC No leukocytosis to suggest acute infection, no anemia [RB]  0318 Basic metabolic panel(!) No significant electrolyte derangement [RB]    Clinical Course User Index [RB] Roxy Horseman, PA-C    Medical Decision Making Patient here with shortness of breath, and chest tightness.  She has also had some wheezing.  Reports sick contacts at home.  She has had  fever and cough.  I am suspicious of flu.  She did have some wheezing earlier, which improved with neb.  No wheezing now.   Patient continues to feel improved.  I gave an additional breathing treatment.  Also gave Solu-Medrol.  COVID and flu are negative.  She has been afebrile here.  PE study was negative for PE or pneumonia. Symptoms seem most consistent with asthma exacerbation.  Will give inhaler and prednisone for home.    Amount and/or Complexity of Data Reviewed Labs: ordered. Decision-making details documented in ED Course.    Details: D-dimer is elevated, will check PE study Radiology: ordered and independent interpretation performed.    Details: No obvious infiltrate seen on chest x-ray  Risk Prescription drug management.         Consultants: No consultations were needed in caring for this patient.   Treatment and Plan: I considered admission due to patient's initial presentation, but after considering the examination and diagnostic results, patient will not require admission and can be discharged with outpatient follow-up.    Final Clinical Impressions(s) / ED Diagnoses     ICD-10-CM   1. Exacerbation of asthma, unspecified asthma severity, unspecified whether persistent  J45.901       ED Discharge Orders          Ordered    predniSONE (DELTASONE) 20 MG tablet  Daily        06/04/23 0315              Discharge Instructions Discussed with and Provided to Patient:   Discharge Instructions   None      Roxy Horseman, PA-C 06/04/23 0318    Melene Plan, DO 06/04/23 [phone]

## 2023-06-04 ENCOUNTER — Emergency Department (HOSPITAL_COMMUNITY): Payer: Medicare HMO

## 2023-06-04 DIAGNOSIS — R062 Wheezing: Secondary | ICD-10-CM | POA: Diagnosis not present

## 2023-06-04 DIAGNOSIS — Z0389 Encounter for observation for other suspected diseases and conditions ruled out: Secondary | ICD-10-CM | POA: Diagnosis not present

## 2023-06-04 DIAGNOSIS — R0600 Dyspnea, unspecified: Secondary | ICD-10-CM | POA: Diagnosis not present

## 2023-06-04 LAB — BASIC METABOLIC PANEL
Anion gap: 12 (ref 5–15)
BUN: 7 mg/dL — ABNORMAL LOW (ref 8–23)
CO2: 25 mmol/L (ref 22–32)
Calcium: 9.4 mg/dL (ref 8.9–10.3)
Chloride: 100 mmol/L (ref 98–111)
Creatinine, Ser: 0.53 mg/dL (ref 0.44–1.00)
GFR, Estimated: >60 mL/min (ref >60)
Glucose, Bld: 109 mg/dL — ABNORMAL HIGH (ref 70–99)
Potassium: 3.6 mmol/L (ref 3.5–5.1)
Sodium: 137 mmol/L (ref 135–145)

## 2023-06-04 LAB — CBC
HCT: 43.2 % (ref 36.0–46.0)
Hemoglobin: 14.5 g/dL (ref 12.0–15.0)
MCH: 29.5 pg (ref 26.0–34.0)
MCHC: 33.6 g/dL (ref 30.0–36.0)
MCV: 87.8 fL (ref 80.0–100.0)
Platelets: 266 10*3/uL (ref 150–400)
RBC: 4.92 MIL/uL (ref 3.87–5.11)
RDW: 14.4 % (ref 11.5–15.5)
WBC: 8.4 10*3/uL (ref 4.0–10.5)
nRBC: 0 % (ref 0.0–0.2)

## 2023-06-04 LAB — RESP PANEL BY RT-PCR (RSV, FLU A&B, COVID)  RVPGX2
Influenza A by PCR: NEGATIVE
Influenza B by PCR: NEGATIVE
Resp Syncytial Virus by PCR: NEGATIVE
SARS Coronavirus 2 by RT PCR: NEGATIVE

## 2023-06-04 LAB — D-DIMER, QUANTITATIVE: D-Dimer, Quant: 2.35 ug{FEU}/mL — ABNORMAL HIGH (ref 0.00–0.50)

## 2023-06-04 LAB — TROPONIN I (HIGH SENSITIVITY)
Troponin I (High Sensitivity): 8 ng/L (ref <18)
Troponin I (High Sensitivity): 9 ng/L (ref <18)

## 2023-06-04 MED ORDER — ALBUTEROL SULFATE HFA 108 (90 BASE) MCG/ACT IN AERS
2.0000 | INHALATION_SPRAY | RESPIRATORY_TRACT | Status: DC | PRN
  Filled 2023-06-04: qty 6.7

## 2023-06-04 MED ORDER — PREDNISONE 20 MG PO TABS: 40.0000 mg | ORAL_TABLET | Freq: Every day | ORAL | 0 refills | Status: DC

## 2023-06-04 MED ORDER — METHYLPREDNISOLONE SODIUM SUCC 125 MG IJ SOLR
125.0000 mg | Freq: Once | INTRAMUSCULAR | Status: AC
  Administered 2023-06-04: 125 mg via INTRAVENOUS
  Filled 2023-06-04: qty 2

## 2023-06-04 MED ORDER — IOHEXOL 350 MG/ML SOLN
75.0000 mL | Freq: Once | INTRAVENOUS | Status: AC | PRN
  Administered 2023-06-04: 75 mL via INTRAVENOUS

## 2023-06-04 MED ORDER — IPRATROPIUM-ALBUTEROL 0.5-2.5 (3) MG/3ML IN SOLN
3.0000 mL | Freq: Once | RESPIRATORY_TRACT | Status: AC
  Administered 2023-06-04: 3 mL via RESPIRATORY_TRACT
  Filled 2023-06-04: qty 3

## 2023-06-04 NOTE — ED Notes (Addendum)
Patient transported to CT 

## 2023-06-05 ENCOUNTER — Ambulatory Visit (INDEPENDENT_AMBULATORY_CARE_PROVIDER_SITE_OTHER): Payer: Medicare HMO | Admitting: Family Medicine

## 2023-06-05 VITALS — BP 134/88 | HR 105 | Temp 97.7°F | Ht 64.0 in | Wt 203.0 lb

## 2023-06-05 DIAGNOSIS — J4541 Moderate persistent asthma with (acute) exacerbation: Secondary | ICD-10-CM

## 2023-06-05 MED ORDER — BECLOMETHASONE DIPROP HFA 80 MCG/ACT IN AERB: 2.0000 | INHALATION_SPRAY | Freq: Two times a day (BID) | RESPIRATORY_TRACT | 5 refills | Status: DC

## 2023-06-05 NOTE — Progress Notes (Signed)
Subjective:    Patient ID: Stephanie [name], female    DOB: 10-31-1960, 63 y.o.   MRN: [id number]  Discussed the use of AI scribe software for clinical note transcription with the patient, who gave verbal consent to proceed.  History of Present Illness   The patient, with a history of asthma, presented with a flare-up of symptoms that led to an emergency department visit. The onset of symptoms was gradual, with the patient noting shortness of breath since around Christmas time. The patient had been taking gabapentin, which she initially suspected as the cause of her breathing difficulties, leading her to discontinue the medication. However, the breathing issues persisted. The patient reported increased dependence on her albuterol inhaler, needing to use it frequently.  Over the weekend prior to the emergency department visit, the patient's condition worsened, with chest pain starting on Monday. The emergency department conducted a scan and blood work, ruling out lung clots. The patient was prescribed prednisone, which she started taking the day before the current consultation. The patient reported using the albuterol inhaler three times the previous day.  The patient also expressed concerns about a potential mold issue in her house from the previous year, suspecting it might be contributing to her current respiratory issues. She reported waking up with swollen, bloodshot eyes. The patient had been using multiple air purifiers but had stopped due to the cost of replacing filters. She also mentioned using a humidifier to manage the low humidity in her house.  The patient's symptoms were severe enough to cause chest pain, which she described as a feeling of tightness. Despite this, a scan of the patient's chest did not show any significant buildup in the arteries, suggesting that the pain was more likely due to the asthma rather than a heart-related issue.         Review of Systems     Objective:     Physical Exam   VITALS: BP- 134/88   Bilateral expiratory wheezes no respiratory distress heart regular HEENT benign Heart regular.  Extremities no edema      Assessment & Plan:  Assessment and Plan    Asthma exacerbation Increased shortness of breath since Christmas, increased use of albuterol inhaler, and recent ER visit. No evidence of pulmonary embolism on recent imaging. Started on prednisone. -Continue prednisone 2 tablets daily for 5 days. -Continue albuterol 2 puffs every 4 hours as needed. -Start inhaled corticosteroid (details not specified in conversation) 2 puffs twice daily, rinse mouth after use. -Check in via MyChart in 7-10 days to report progress.  Gabapentin discontinuation Stopped gabapentin due to perceived side effects, but symptoms persisted. -Resume gabapentin with a gradual increase in dose over 10 days as follows:1 tablet daily for 3 days, 1 tablet twice daily for 3 days, 1 tablet three times daily for 3 days, then increase to 2 tablets in the evening while maintaining 1 tablet in the morning and midday.  Environmental factors Concerns about mold and dust in home, stopped using air purifiers due to cost of filters. -Consider restarting use of air purifiers with new filters. -Consider use of a humidifier to increase humidity in home.  Follow-up Scheduled for early March. -Keep follow-up appointment. -Report if not improving as expected over the next week.     Patient having exacerbation of asthma to finish out the prednisone Add steroid inhaler More than likely patient's insurance will require some sort of prior approval or perhaps even give [place] a formulary currently right now unable  to see that through [employer]

## 2023-06-17 ENCOUNTER — Emergency Department (HOSPITAL_COMMUNITY): Payer: Medicare HMO

## 2023-06-17 ENCOUNTER — Other Ambulatory Visit: Payer: Self-pay

## 2023-06-17 ENCOUNTER — Emergency Department (HOSPITAL_COMMUNITY)
Admission: EM | Admit: 2023-06-17 | Discharge: 2023-06-18 | Disposition: A | Discharge status: Home / Self Care | Payer: Medicare HMO | Attending: Emergency Medicine | Admitting: Emergency Medicine

## 2023-06-17 ENCOUNTER — Ambulatory Visit: Payer: Self-pay | Admitting: Family Medicine

## 2023-06-17 ENCOUNTER — Encounter (HOSPITAL_COMMUNITY): Payer: Self-pay | Admitting: Emergency Medicine

## 2023-06-17 DIAGNOSIS — Z20822 Contact with and (suspected) exposure to covid-19: Secondary | ICD-10-CM | POA: Insufficient documentation

## 2023-06-17 DIAGNOSIS — J101 Influenza due to other identified influenza virus with other respiratory manifestations: Secondary | ICD-10-CM | POA: Diagnosis not present

## 2023-06-17 DIAGNOSIS — J45909 Unspecified asthma, uncomplicated: Secondary | ICD-10-CM | POA: Diagnosis not present

## 2023-06-17 DIAGNOSIS — R079 Chest pain, unspecified: Secondary | ICD-10-CM | POA: Diagnosis not present

## 2023-06-17 DIAGNOSIS — J9801 Acute bronchospasm: Secondary | ICD-10-CM | POA: Diagnosis not present

## 2023-06-17 DIAGNOSIS — I10 Essential (primary) hypertension: Secondary | ICD-10-CM | POA: Insufficient documentation

## 2023-06-17 DIAGNOSIS — R0789 Other chest pain: Secondary | ICD-10-CM | POA: Diagnosis not present

## 2023-06-17 DIAGNOSIS — R0602 Shortness of breath: Secondary | ICD-10-CM | POA: Diagnosis not present

## 2023-06-17 DIAGNOSIS — B349 Viral infection, unspecified: Secondary | ICD-10-CM

## 2023-06-17 DIAGNOSIS — Z91040 Latex allergy status: Secondary | ICD-10-CM | POA: Diagnosis not present

## 2023-06-17 DIAGNOSIS — R058 Other specified cough: Secondary | ICD-10-CM | POA: Diagnosis not present

## 2023-06-17 DIAGNOSIS — Z79899 Other long term (current) drug therapy: Secondary | ICD-10-CM | POA: Insufficient documentation

## 2023-06-17 LAB — CBC
HCT: 44.7 % (ref 36.0–46.0)
Hemoglobin: 14.3 g/dL (ref 12.0–15.0)
MCH: 28.9 pg (ref 26.0–34.0)
MCHC: 32 g/dL (ref 30.0–36.0)
MCV: 90.3 fL (ref 80.0–100.0)
Platelets: 279 10*3/uL (ref 150–400)
RBC: 4.95 MIL/uL (ref 3.87–5.11)
RDW: 14.5 % (ref 11.5–15.5)
WBC: 7.2 10*3/uL (ref 4.0–10.5)
nRBC: 0 % (ref 0.0–0.2)

## 2023-06-17 LAB — BASIC METABOLIC PANEL
Anion gap: 11 (ref 5–15)
BUN: 10 mg/dL (ref 8–23)
CO2: 24 mmol/L (ref 22–32)
Calcium: 9.3 mg/dL (ref 8.9–10.3)
Chloride: 107 mmol/L (ref 98–111)
Creatinine, Ser: 0.5 mg/dL (ref 0.44–1.00)
GFR, Estimated: >60 mL/min (ref >60)
Glucose, Bld: 128 mg/dL — ABNORMAL HIGH (ref 70–99)
Potassium: 2.9 mmol/L — ABNORMAL LOW (ref 3.5–5.1)
Sodium: 142 mmol/L (ref 135–145)

## 2023-06-17 LAB — RESP PANEL BY RT-PCR (RSV, FLU A&B, COVID)  RVPGX2
Influenza A by PCR: POSITIVE — AB
Influenza B by PCR: NEGATIVE
Resp Syncytial Virus by PCR: NEGATIVE
SARS Coronavirus 2 by RT PCR: NEGATIVE

## 2023-06-17 LAB — TROPONIN I (HIGH SENSITIVITY): Troponin I (High Sensitivity): 5 ng/L (ref <18)

## 2023-06-17 NOTE — Telephone Encounter (Signed)
Chief Complaint: Asthma exacerbation Symptoms: wheezing, SOB, chest tightness, productive cough Frequency: 2 weeks, worsening 2 days ago Pertinent Negatives: Patient denies fever Disposition: [x] ED /[] Urgent Care (no appt availability in office) / [] Appointment(In office/virtual)/ []  [facility]/ [] Home Care/ [] Refused Recommended Disposition /[]  Mobile Bus/ []  Follow-up with PCP Additional Notes: Patient c/o asthma exacerbation x 2 weeks, but worsening over the past 2 days. Reports that she had recent acute PCP visit. Reports that she has has no more albuterol nebulizer vials, but does have albuterol inhaler. Reports that she has been using albuterol every 4-6 hours for the past 2 weeks, but sx have worsened over the past 2 days. Endorses coughing, chest tightness, moderate-severe SOB. [facility]ager recommended ED for further evaluation d/t continued albuterol use every 4 hours. Patient verbalized understanding and to call 911 with worsening symptoms. Patient endorses that she will reach out to family to assist in taking to ED.    Reason for Disposition  Quick-relief asthma medicine (e.g., albuterol /salbutamol, levalbuterol by inhaler or nebulizer) is needed more frequently than every 4 hours to keep you comfortable  Answer Assessment - Initial Assessment Questions 1. RESPIRATORY STATUS: "Describe your breathing?" (e.g., wheezing, shortness of breath, unable to speak, severe coughing)      Wheezing, SOB, and productive coughing 2. ONSET: "When did this asthma attack begin?"      2 weeks, last 2 days worsening 3. TRIGGER: "What do you think triggered this attack?" (e.g., URI, exposure to pollen or other allergen, tobacco smoke)      "I honestly, I'm wondering if it's my house" " I feel better when I step outside" 4. PEAK EXPIRATORY FLOW RATE (PEFR): "Do you use a peak flow meter?" If Yes, ask: "What's the current peak flow? What's your personal best peak flow?"      No, I  used a nebulizer 5. SEVERITY: "How bad is this attack?"    - MILD: No SOB at rest, mild SOB with walking, speaks normally in sentences, can lie down, no retractions, pulse < 100. (GREEN Zone: PEFR 80-100%)   - MODERATE: SOB at rest, SOB with minimal exertion and prefers to sit, cannot lie down flat, speaks in phrases, mild retractions, audible wheezing, pulse 100-120. (YELLOW Zone: PEFR 50-79%)    - SEVERE: Struggling for each breath, speaks in single words, struggling to breathe, sitting hunched forward, retractions, usually loud wheezing, sometimes minimal wheezing because of decreased air movement, pulse > 120. (RED Zone: PEFR < 50%).      Moderate-severe (when it nears 4 hrs post neb) 6. ASTHMA MEDICINES:  "What treatments have you tried?"    - INHALED QUICK RELIEF (RESCUE): "What is your inhaled quick-relief medicine?" (e.g., albuterol, salbutamol) "Do you use an inhaler or a nebulizer?" "How frequently have you been using this medicine?"   - CONTROLLER (LONG-TERM-CONTROL): "Do you take an inhaled steroid? (e.g., Asmanex, Flovent, Pulmicort, Qvar)     Albuterol neb 7. INHALED QUICK-RELIEF TREATMENTS FOR THIS ATTACK: "What treatments have you given yourself so far?" and "How many and how often?" If using an inhaler, ask, "How many puffs?" Note: Routine treatments are 2 puffs every 4 hours as needed. Rescue treatments are 4 puffs repeated every 20 minutes, up to three times as needed.      Nebulizer yesterday - used last dose 8. OTHER SYMPTOMS: "Do you have any other symptoms? (e.g., chest pain, coughing up yellow sputum, fever, runny nose)     Productive cough - yellow Mild chest pain -  3-4/10, reports tightness d/t coughing 9. O2 SATURATION MONITOR:  "Do you use an oxygen saturation monitor (pulse oximeter) at home?" If Yes, "What is your reading (oxygen level) today?" "What is your usual oxygen saturation reading?" (e.g., 95%)     no  Protocols used: Asthma Attack-A-AH

## 2023-06-17 NOTE — ED Triage Notes (Signed)
Pt in with sob and cough with yellow sputum x 4 days. Pt states hx of asthma, and she is taking max dose of albuterol inhaler at home with no relief. Reports pressure to central chest and chills. Hx of PE's, not taking anticoags since November

## 2023-06-17 NOTE — Telephone Encounter (Signed)
This RN attempted return phone call to patient. No answer, LVM.

## 2023-06-17 NOTE — ED Provider Triage Note (Signed)
 Emergency Medicine Provider Triage Evaluation Note  Stephanie [name] , a 62 y.o. female  was evaluated in triage.  Pt complains of cough and shortness of breath.  Reports that this been ongoing for about 4 days.  Reports that her cough is productive with a yellow sputum.  She states she has a history of asthma and has tried using albuterol without improvement in symptoms.  Reports having a central chest pressure as well as chills.  Has a history of prior PE but has not been on any anticoagulant for several months.  Last PE was in August 2022 per patient's report.  Review of Systems  Positive: As above Negative: As above  Physical Exam  BP (!) 136/100   Pulse 100   Temp 98.4 F (36.9 C)   Resp 18   Wt 92.1 kg   LMP 09/17/2016 (Approximate)   SpO2 97%   BMI 34.84 kg/m  Gen:   Awake, no distress   Resp:  Normal effort, some wheezing heard in right lung fields, no rales MSK:   Moves extremities without difficulty  Other:    Medical Decision Making  Medically screening exam initiated at 8:27 PM.  Appropriate orders placed.  Stephanie [name] was informed that the remainder of the evaluation will be completed by another provider, this initial triage assessment does not replace that evaluation, and the importance of remaining in the ED until their evaluation is complete.     Smitty Knudsen, PA-C 06/17/23 2028

## 2023-06-18 ENCOUNTER — Ambulatory Visit: Payer: Medicare HMO | Admitting: Allergy and Immunology

## 2023-06-18 LAB — TROPONIN I (HIGH SENSITIVITY): Troponin I (High Sensitivity): 6 ng/L (ref <18)

## 2023-06-18 MED ORDER — PREDNISONE 20 MG PO TABS: 40.0000 mg | ORAL_TABLET | Freq: Every day | ORAL | 0 refills | Status: DC

## 2023-06-18 MED ORDER — POTASSIUM CHLORIDE CRYS ER 20 MEQ PO TBCR
60.0000 meq | EXTENDED_RELEASE_TABLET | Freq: Once | ORAL | Status: AC
  Administered 2023-06-18: 60 meq via ORAL
  Filled 2023-06-18: qty 3

## 2023-06-18 MED ORDER — ALBUTEROL SULFATE (2.5 MG/3ML) 0.083% IN NEBU
5.0000 mg | INHALATION_SOLUTION | RESPIRATORY_TRACT | Status: AC
  Administered 2023-06-18 (×3): 5 mg via RESPIRATORY_TRACT
  Filled 2023-06-18 (×3): qty 6

## 2023-06-18 MED ORDER — IPRATROPIUM BROMIDE 0.02 % IN SOLN
0.5000 mg | RESPIRATORY_TRACT | Status: AC
  Administered 2023-06-18 (×3): 0.5 mg via RESPIRATORY_TRACT
  Filled 2023-06-18 (×3): qty 2.5

## 2023-06-18 MED ORDER — ALBUTEROL SULFATE (2.5 MG/3ML) 0.083% IN NEBU: 2.5000 mg | INHALATION_SOLUTION | RESPIRATORY_TRACT | 1 refills | Status: DC | PRN

## 2023-06-18 MED ORDER — DEXAMETHASONE SODIUM PHOSPHATE 10 MG/ML IJ SOLN
10.0000 mg | Freq: Once | INTRAMUSCULAR | Status: AC
  Administered 2023-06-18: 10 mg via INTRAMUSCULAR
  Filled 2023-06-18: qty 1

## 2023-06-18 NOTE — Discharge Instructions (Signed)
 Begin prednisone  on the morning of 06/19/2023 and take as prescribed until finished.  You may use albuterol  every 4-6 hours for cough, wheezing, shortness of breath.  We do recommend follow-up with your primary care doctor.  You may utilize Tylenol  or ibuprofen for any headache, body aches, fever.  Try over-the-counter Mucinex or Delsym for cough.  Return for new or concerning symptoms.

## 2023-06-18 NOTE — ED Provider Notes (Signed)
 [place] [facility] AT [facility] Provider Note   CSN: [id number] Arrival date & time: 06/17/23  1910     History  Chief Complaint  Patient presents with   Cough   Shortness of Breath    Stephanie [name] is a 63 y.o. female.  63 year old female with a history of asthma presents to the emergency department for shortness of breath and cough.  Has also noted some upper respiratory congestion.  Symptoms began 4 days ago.  Cough productive of a yellow sputum.  She has necessitated use of her albuterol  inhaler every 4 hours, though feels that it is not adequately controlling her shortness of breath.  She has not noted any fevers.  Denies hemoptysis, syncope.  Does have a history of pulmonary embolus in 2022.  She is not on chronic anticoagulation.  The history is provided by the patient. No language interpreter was used.  Cough Associated symptoms: shortness of breath   Shortness of Breath Associated symptoms: cough        Home Medications Prior to Admission medications   Medication Sig Start Date End Date Taking? Authorizing Provider  albuterol  (PROVENTIL ) (2.5 MG/3ML) 0.083% nebulizer solution Take 3 mLs (2.5 mg total) by nebulization every 4 (four) hours as needed for wheezing or shortness of breath. 06/18/23  Yes Keith Sor, PA-C  predniSONE  (DELTASONE ) 20 MG tablet Take 2 tablets (40 mg total) by mouth daily. 06/18/23  Yes Keith Sor, PA-C  albuterol  (PROVENTIL  HFA) 108 (90 Base) MCG/ACT inhaler Inhale 2 puffs into the lungs every 4 (four) hours as needed for wheezing or shortness of breath. 06/20/21   Kozlow, Camellia JINNY, MD  amLODipine  (NORVASC ) 5 MG tablet Take 1 tablet (5 mg total) by mouth daily. 02/27/23   Alphonsa Glendia LABOR, MD  Ascorbic Acid (VITAMIN C) 1000 MG tablet Take 1,000 mg by mouth daily.    [provider]  beclomethasone (QVAR) 80 MCG/ACT inhaler Inhale 2 puffs into the lungs 2 (two) times daily. 06/05/23   Alphonsa Glendia LABOR, MD   Cholecalciferol (VITAMIN D3) 50 MCG (2000 UT) TABS Take by mouth daily at 6 (six) AM.    [provider]  EPINEPHrine  (EPIPEN  2-PAK) 0.3 mg/0.3 mL IJ SOAJ injection Inject 0.3 mg into the muscle once as needed for anaphylaxis (allergic reaction). 06/20/21   Kozlow, Camellia JINNY, MD  GARLIC PO Take 1 tablet by mouth daily.    [provider]  omeprazole  (PRILOSEC) 40 MG capsule Take 1 capsule (40 mg total) by mouth daily. 06/20/21   Kozlow, Camellia JINNY, MD  TRUE METRIX BLOOD GLUCOSE TEST test strip SMARTSIG:Via Meter 05/26/21   [provider]  TRUEplus Lancets 33G MISC  05/26/21   [provider]  vitamin E 180 MG (400 UNITS) capsule Take 400 Units by mouth daily.    [provider]  Zinc 50 MG CAPS Take 1 capsule by mouth daily at 6 (six) AM.    [provider]      Allergies    Amoxicillin , Aspirin, Other, Hydrocodone , Latex, Oxycodone , and Levaquin  [levofloxacin ]    Review of Systems   Review of Systems  Respiratory:  Positive for cough and shortness of breath.   Ten systems reviewed and are negative for acute change, except as noted in the HPI.    Physical Exam Updated Vital Signs BP (!) 125/91   Pulse 94   Temp 98.3 F (36.8 C) (Oral)   Resp 16   Wt 92.1 kg  LMP 09/17/2016 (Approximate)   SpO2 96%   BMI 34.84 kg/m   Physical Exam Vitals and nursing note reviewed.  Constitutional:      General: She is not in acute distress.    Appearance: She is well-developed. She is not diaphoretic.     Comments: Nontoxic-appearing and in no acute distress  HENT:     Head: Normocephalic and atraumatic.  Eyes:     General: No scleral icterus.    Conjunctiva/sclera: Conjunctivae normal.  Cardiovascular:     Rate and Rhythm: Normal rate and regular rhythm.     Pulses: Normal pulses.  Pulmonary:     Effort: Pulmonary effort is normal. No respiratory distress.     Comments: No tachypnea or dyspnea, though patient does have diffuse expiratory  wheezing in all lung fields bilaterally. Musculoskeletal:        General: Normal range of motion.     Cervical back: Normal range of motion.  Skin:    General: Skin is warm and dry.     Coloration: Skin is not pale.     Findings: No erythema or rash.  Neurological:     Mental Status: She is alert and oriented to person, place, and time.     Coordination: Coordination normal.  Psychiatric:        Behavior: Behavior normal.     ED Results / Procedures / Treatments   Labs (all labs ordered are listed, but only abnormal results are displayed) Labs Reviewed  RESP PANEL BY RT-PCR (RSV, FLU A&B, COVID)  RVPGX2 - Abnormal; Notable for the following components:      Result Value   Influenza A by PCR POSITIVE (*)    All other components within normal limits  BASIC METABOLIC PANEL - Abnormal; Notable for the following components:   Potassium 2.9 (*)    Glucose, Bld 128 (*)    All other components within normal limits  CBC  TROPONIN I (HIGH SENSITIVITY)  TROPONIN I (HIGH SENSITIVITY)    EKG EKG Interpretation Date/Time:  Monday June 17 2023 19:54:22 EST Ventricular Rate:  94 PR Interval:  172 QRS Duration:  82 QT Interval:  362 QTC Calculation: 452 R Axis:   -38  Text Interpretation: Normal sinus rhythm Left axis deviation Moderate voltage criteria for LVH, may be normal variant ( R in aVL , Cornell product ) Possible Anteroseptal infarct , age undetermined Abnormal ECG When compared with ECG of 04-Jun-2023 10:07, Right bundle branch block is no longer present Confirmed by Raford Lenis ([id number]) on 06/18/2023 1:00:15 AM  Radiology DG Chest 2 View Result Date: 06/17/2023 CLINICAL DATA:  Chest pain and shortness of breath. Symptoms for 4 days. Productive cough. History of asthma. EXAM: CHEST - 2 VIEW COMPARISON:  06/03/2023 FINDINGS: Heart size and pulmonary vascularity are normal. Lungs are clear. No pleural effusions. No pneumothorax. Mediastinal contours appear intact. Degenerative  changes in the spine and shoulders. IMPRESSION: No active cardiopulmonary disease. Electronically Signed   By: Elsie Gravely M.D.   On: 06/17/2023 20:50    Procedures Procedures    Medications Ordered in ED Medications  potassium chloride  SA (KLOR-CON  M) CR tablet 60 mEq (60 mEq Oral Given 06/18/23 0202)  dexamethasone  (DECADRON ) injection 10 mg (10 mg Intramuscular Given 06/18/23 0202)  albuterol  (PROVENTIL ) (2.5 MG/3ML) 0.083% nebulizer solution 5 mg (5 mg Nebulization Given 06/18/23 0314)  ipratropium (ATROVENT ) nebulizer solution 0.5 mg (0.5 mg Nebulization Given 06/18/23 0314)    ED Course/ Medical Decision Making/ A&P Clinical Course  as of 06/18/23 0414  Tue Jun 18, 2023  0253 Repeat trop is stable, negative. [KH]  0340 Lungs there to auscultation on repeat assessment.  Patient states that she is feeling better. [KH]    Clinical Course User Index [KH] Keith Sor, PA-C                                 Medical Decision Making Amount and/or Complexity of Data Reviewed Labs: ordered. Radiology: ordered.  Risk Prescription drug management.   This patient presents to the ED for concern of SOB and cough, this involves an extensive number of treatment options, and is a complaint that carries with it a high risk of complications and morbidity.  The differential diagnosis includes viral illness vs PNA vs asthma exacerbation vs CHF exacerbation vs anxiety   Co morbidities that complicate the patient evaluation  Asthma HTN DVT/PE   Additional history obtained:  External records from outside source obtained and reviewed including CT PE study from 06/04/23 which was negative.   Lab Tests:  I Ordered, and personally interpreted labs.  The pertinent results include:  K 2.9. Influenza A positive.   Imaging Studies ordered:  I ordered imaging studies including CXR  I independently visualized and interpreted imaging which showed no acute cardiopulmonary abnormality I agree  with the radiologist interpretation   Cardiac Monitoring:  The patient was maintained on a cardiac monitor.  I personally viewed and interpreted the cardiac monitored which showed an underlying rhythm of: NSR   Medicines ordered and prescription drug management:  I ordered medication including Duoneb x3 for wheezing as well as Decadron . Given oral potassium for hypokalemia  Reevaluation of the patient after these medicines showed that the patient improved I have reviewed the patients home medicines and have made adjustments as needed   Test Considered:  VBG   Problem List / ED Course:  As above   Reevaluation:  After the interventions noted above, I reevaluated the patient and found that they have :improved   Social Determinants of Health:  Lives independently    Dispostion:  After consideration of the diagnostic results and the patients response to treatment, I feel that the patent would benefit from outpatient course of prednisone . Given refill for albuterol  solution. Outside of the window for Tamiflu  use. Encouraged close f/u with PCP. Return precautions discussed and provided. Patient discharged in stable condition with no unaddressed concerns.          Final Clinical Impression(s) / ED Diagnoses Final diagnoses:  Acute bronchospasm due to viral infection  Influenza A    Rx / DC Orders ED Discharge Orders          Ordered    predniSONE  (DELTASONE ) 20 MG tablet  Daily        06/18/23 0342    albuterol  (PROVENTIL ) (2.5 MG/3ML) 0.083% nebulizer solution  Every 4 hours PRN        06/18/23 0342              Keith Sor, PA-C 06/18/23 0415    Raford Lenis, MD 06/18/23 [phone]

## 2023-06-19 ENCOUNTER — Telehealth: Payer: Self-pay

## 2023-06-19 ENCOUNTER — Ambulatory Visit (INDEPENDENT_AMBULATORY_CARE_PROVIDER_SITE_OTHER): Payer: Medicare HMO | Admitting: Family Medicine

## 2023-06-19 VITALS — BP 131/88 | HR 92 | Temp 97.2°F | Ht 64.0 in | Wt 203.0 lb

## 2023-06-19 DIAGNOSIS — J111 Influenza due to unidentified influenza virus with other respiratory manifestations: Secondary | ICD-10-CM

## 2023-06-19 DIAGNOSIS — E876 Hypokalemia: Secondary | ICD-10-CM | POA: Diagnosis not present

## 2023-06-19 MED ORDER — ALBUTEROL SULFATE HFA 108 (90 BASE) MCG/ACT IN AERS: 2.0000 | INHALATION_SPRAY | RESPIRATORY_TRACT | 5 refills | Status: DC | PRN

## 2023-06-19 NOTE — Transitions of Care (Post Inpatient/ED Visit) (Signed)
   06/19/2023  Name: Stephanie [name] MRN: [id number] DOB: 1960/09/27  Today's TOC FU Call Status: Today's TOC FU Call Status:: Unsuccessful Call (1st Attempt) Unsuccessful Call (1st Attempt) Date: 06/19/23  Attempted to reach the patient regarding the most recent Inpatient/ED visit.  Follow Up Plan: Additional outreach attempts will be made to reach the patient to complete the Transitions of Care (Post Inpatient/ED visit) call.   Jahzion Brogden, CMA  [facility] Team Direct Dial: [phone]

## 2023-06-19 NOTE — Progress Notes (Signed)
   Subjective:    Patient ID: Stephanie [name], female    DOB: [birth year], 64 y.o.   MRN: [id number]  Discussed the use of AI scribe software for clinical note transcription with the patient, who gave verbal consent to proceed.  History of Present Illness   Stephanie [name] is a 63 year old female who presents with symptoms following a recent flu diagnosis.  She experienced an abrupt onset of symptoms on Friday, including a severe headache and significant coughing that led to chest pain. Due to the intensity of these symptoms, she sought care at the emergency department late at night.  In the emergency department, she underwent diagnostic tests including blood work, an EKG, a flu test, and a chest x-ray. The flu test was positive, and the chest x-ray showed no signs of pneumonia. She received a breathing treatment and potassium supplementation before being discharged around 3 or 4 in the morning.  Currently, she feels much improved but continues to experience bad headaches and congestion, with headaches persisting throughout the night. Nausea and coughing are intermittent. She was prescribed albuterol  for her symptoms, but there has been an issue with the prescription being filled, as the medication has not yet been dispensed despite contacting the pharmacy. No current fever or chills, and she describes a significant improvement in her symptoms since the initial onset.         Review of Systems     Objective:    Physical Exam   CHEST: lungs clear to auscultation CARDIOVASCULAR: normal heart sounds           Assessment & Plan:  Assessment and Plan    Influenza Recent onset with symptoms of headache, cough, and chest pain. No signs of pneumonia on chest x-ray. Currently improving but still experiencing moderate headache and congestion. -Continue supportive care and monitor for signs of secondary pneumonia (return of fever, chills, increased cough, difficulty  breathing).  Hypokalemia Noted during recent ER visit, possibly due to decreased intake during illness. -Order follow-up blood work to reassess potassium levels.  Albuterol  prescription Patient reports prescription for albuterol  inhaler has not been filled. -Resend prescription to pharmacy and instruct patient to notify office if there are any issues with Medicare approval.  General Health Maintenance -Advise patient to avoid contact with others for a few more days to prevent spread of influenza. -Notify patient of lab results when available. -Follow-up if there is a return of increased cough, fever, and chills.      Lab work ordered.  Follow-up accordingly. No sign of pneumonia, warning signs to watch for discussed

## 2023-06-19 NOTE — Transitions of Care (Post Inpatient/ED Visit) (Signed)
   06/19/2023  Name: Stephanie [name] MRN: [id number] DOB: 04/09/1961  Patient seen by pcp before TOC could be completed.    Doneisha Ivey, CMA  [facility] Team Direct Dial: [phone]

## 2023-06-20 ENCOUNTER — Encounter: Payer: Self-pay | Admitting: Family Medicine

## 2023-06-20 ENCOUNTER — Telehealth: Payer: Self-pay

## 2023-06-20 LAB — BASIC METABOLIC PANEL
BUN/Creatinine Ratio: 16 (ref 12–28)
BUN: 10 mg/dL (ref 8–27)
CO2: 19 mmol/L — ABNORMAL LOW (ref 20–29)
Calcium: 9.4 mg/dL (ref 8.7–10.3)
Chloride: 102 mmol/L (ref 96–106)
Creatinine, Ser: 0.62 mg/dL (ref 0.57–1.00)
Glucose: 84 mg/dL (ref 70–99)
Potassium: 4.9 mmol/L (ref 3.5–5.2)
Sodium: 141 mmol/L (ref 134–144)
eGFR: 101 mL/min/{1.73_m2} (ref >59)

## 2023-06-20 LAB — MAGNESIUM: Magnesium: 2 mg/dL (ref 1.6–2.3)

## 2023-06-20 NOTE — Telephone Encounter (Signed)
 Reason for CRM: (QVAR) 80 MCG/ACT inhaler - insurance needs prior authorization, Summer with [facility] [phone]

## 2023-06-22 ENCOUNTER — Emergency Department (HOSPITAL_COMMUNITY)
Admission: EM | Admit: 2023-06-22 | Discharge: 2023-06-23 | Disposition: A | Discharge status: Home / Self Care | Payer: Medicare HMO | Attending: Emergency Medicine | Admitting: Emergency Medicine

## 2023-06-22 ENCOUNTER — Other Ambulatory Visit: Payer: Self-pay

## 2023-06-22 ENCOUNTER — Encounter (HOSPITAL_COMMUNITY): Payer: Self-pay

## 2023-06-22 DIAGNOSIS — Z91040 Latex allergy status: Secondary | ICD-10-CM | POA: Insufficient documentation

## 2023-06-22 DIAGNOSIS — Z79899 Other long term (current) drug therapy: Secondary | ICD-10-CM | POA: Insufficient documentation

## 2023-06-22 DIAGNOSIS — R0789 Other chest pain: Secondary | ICD-10-CM | POA: Diagnosis present

## 2023-06-22 DIAGNOSIS — R079 Chest pain, unspecified: Secondary | ICD-10-CM | POA: Diagnosis not present

## 2023-06-22 DIAGNOSIS — I517 Cardiomegaly: Secondary | ICD-10-CM | POA: Diagnosis not present

## 2023-06-22 DIAGNOSIS — J4541 Moderate persistent asthma with (acute) exacerbation: Secondary | ICD-10-CM | POA: Diagnosis not present

## 2023-06-22 DIAGNOSIS — I10 Essential (primary) hypertension: Secondary | ICD-10-CM | POA: Diagnosis not present

## 2023-06-22 DIAGNOSIS — J101 Influenza due to other identified influenza virus with other respiratory manifestations: Secondary | ICD-10-CM | POA: Diagnosis not present

## 2023-06-22 NOTE — ED Triage Notes (Signed)
 Pt diagnosed with flu a few days ago. C/o cough with yellow sputum, chest tightness and sob that started yesterday. Pain does not radiate.

## 2023-06-23 ENCOUNTER — Emergency Department (HOSPITAL_COMMUNITY): Payer: Medicare HMO

## 2023-06-23 DIAGNOSIS — J101 Influenza due to other identified influenza virus with other respiratory manifestations: Secondary | ICD-10-CM | POA: Diagnosis not present

## 2023-06-23 DIAGNOSIS — I517 Cardiomegaly: Secondary | ICD-10-CM | POA: Diagnosis not present

## 2023-06-23 DIAGNOSIS — R079 Chest pain, unspecified: Secondary | ICD-10-CM | POA: Diagnosis not present

## 2023-06-23 LAB — CBC WITH DIFFERENTIAL/PLATELET
Abs Immature Granulocytes: 0.03 10*3/uL (ref 0.00–0.07)
Basophils Absolute: 0 10*3/uL (ref 0.0–0.1)
Basophils Relative: 0 %
Eosinophils Absolute: 0 10*3/uL (ref 0.0–0.5)
Eosinophils Relative: 0 %
HCT: 39.3 % (ref 36.0–46.0)
Hemoglobin: 12.8 g/dL (ref 12.0–15.0)
Immature Granulocytes: 0 %
Lymphocytes Relative: 11 %
Lymphs Abs: 0.9 10*3/uL (ref 0.7–4.0)
MCH: 29 pg (ref 26.0–34.0)
MCHC: 32.6 g/dL (ref 30.0–36.0)
MCV: 88.9 fL (ref 80.0–100.0)
Monocytes Absolute: 0.3 10*3/uL (ref 0.1–1.0)
Monocytes Relative: 4 %
Neutro Abs: 6.9 10*3/uL (ref 1.7–7.7)
Neutrophils Relative %: 85 %
Platelets: 276 10*3/uL (ref 150–400)
RBC: 4.42 MIL/uL (ref 3.87–5.11)
RDW: 14.3 % (ref 11.5–15.5)
WBC: 8.2 10*3/uL (ref 4.0–10.5)
nRBC: 0 % (ref 0.0–0.2)

## 2023-06-23 LAB — BASIC METABOLIC PANEL
Anion gap: 10 (ref 5–15)
BUN: 19 mg/dL (ref 8–23)
CO2: 24 mmol/L (ref 22–32)
Calcium: 8.8 mg/dL — ABNORMAL LOW (ref 8.9–10.3)
Chloride: 107 mmol/L (ref 98–111)
Creatinine, Ser: 0.56 mg/dL (ref 0.44–1.00)
GFR, Estimated: >60 mL/min (ref >60)
Glucose, Bld: 133 mg/dL — ABNORMAL HIGH (ref 70–99)
Potassium: 3.7 mmol/L (ref 3.5–5.1)
Sodium: 141 mmol/L (ref 135–145)

## 2023-06-23 LAB — TROPONIN I (HIGH SENSITIVITY): Troponin I (High Sensitivity): 5 ng/L (ref <18)

## 2023-06-23 MED ORDER — METHYLPREDNISOLONE 4 MG PO TBPK: ORAL_TABLET | ORAL | 0 refills | Status: DC

## 2023-06-23 MED ORDER — IPRATROPIUM-ALBUTEROL 0.5-2.5 (3) MG/3ML IN SOLN
3.0000 mL | Freq: Once | RESPIRATORY_TRACT | Status: AC
  Administered 2023-06-23: 3 mL via RESPIRATORY_TRACT
  Filled 2023-06-23: qty 3

## 2023-06-23 NOTE — ED Provider Notes (Signed)
 [place] EM[facility]GENCY DEPARTMENT AT [facility] Provider Note   CSN: [id number] Arrival date & time: 06/22/23  2046     History  Chief Complaint  Patient presents with   Chest Pain    Stephanie [name] is a 63 y.o. female.  The history is provided by the patient.  Chest Pain Stephanie [name] is a 63 y.o. female who presents to the Emergency Department complaining of chest pain.  She presents to the emergency department for evaluation of chest tightness that waxes and wanes.  Symptoms started yesterday.  She was diagnosed with the flu on Monday.  She has had a cough that is productive of yellow sputum.  Overall her cough is worsening.  No reported fever, vomiting, diarrhea, leg swelling or pain.   Hx/o asthma, htn.  No hx/o cad, chf.  Hx/o dvt/pe - 2023.  PE was provoked secondary to trauma and long travel. Not on anticoagulation.       Home Medications Prior to Admission medications   Medication Sig Start Date End Date Taking? Authorizing Provider  methylPREDNISolone  (MEDROL  DOSEPAK) 4 MG TBPK tablet Take according to label instructions. 06/23/23  Yes Griselda Norris, MD  albuterol  (PROVENTIL  HFA) 108 [phone] Base) MCG/ACT inhaler Inhale 2 puffs into the lungs every 4 (four) hours as needed for wheezing or shortness of breath. 06/20/21   Kozlow, Camellia JINNY, MD  albuterol  (PROVENTIL ) (2.5 MG/3ML) 0.083% nebulizer solution Take 3 mLs (2.5 mg total) by nebulization every 4 (four) hours as needed for wheezing or shortness of breath. 06/18/23   Keith Sor, PA-C  albuterol  (VENTOLIN  HFA) 108 (90 Base) MCG/ACT inhaler Inhale 2 puffs into the lungs every 4 (four) hours as needed for wheezing or shortness of breath. 06/19/23   Alphonsa Glendia LABOR, MD  amLODipine  (NORVASC ) 5 MG tablet Take 1 tablet (5 mg total) by mouth daily. 02/27/23   Alphonsa Glendia LABOR, MD  Ascorbic Acid (VITAMIN C) 1000 MG tablet Take 1,000 mg by mouth daily.    [provider]  beclomethasone (QVAR) 80 MCG/ACT inhaler  Inhale 2 puffs into the lungs 2 (two) times daily. 06/05/23   Alphonsa Glendia LABOR, MD  Cholecalciferol (VITAMIN D3) 50 MCG (2000 UT) TABS Take by mouth daily at 6 (six) AM.    [provider]  EPINEPHrine  (EPIPEN  2-PAK) 0.3 mg/0.3 mL IJ SOAJ injection Inject 0.3 mg into the muscle once as needed for anaphylaxis (allergic reaction). 06/20/21   Kozlow, Camellia JINNY, MD  GARLIC PO Take 1 tablet by mouth daily.    [provider]  omeprazole  (PRILOSEC) 40 MG capsule Take 1 capsule (40 mg total) by mouth daily. 06/20/21   Kozlow, Camellia JINNY, MD  predniSONE  (DELTASONE ) 20 MG tablet Take 2 tablets (40 mg total) by mouth daily. 06/18/23   Keith Sor, PA-C  TRUE METRIX BLOOD GLUCOSE TEST test strip SMARTSIG:Via Meter 05/26/21   [provider]  TRUEplus Lancets 33G MISC  05/26/21   [provider]  vitamin E 180 MG (400 UNITS) capsule Take 400 Units by mouth daily.    [provider]  Zinc 50 MG CAPS Take 1 capsule by mouth daily at 6 (six) AM.    [provider]      Allergies    Amoxicillin , Aspirin, Other, Hydrocodone , Latex, Oxycodone , and Levaquin  [levofloxacin ]    Review of Systems   Review of Systems  Cardiovascular:  Positive for chest pain.  All other systems reviewed and are negative.   Physical Exam  Updated Vital Signs BP 108/86 (BP Location: Right Arm)   Pulse 76   Temp (!) 97.5 F (36.4 C)   Resp 20   Ht 5' 4 (1.626 m)   Wt 92.1 kg   LMP 09/17/2016 (Approximate)   SpO2 98%   BMI 34.84 kg/m  Physical Exam Vitals and nursing note reviewed.  Constitutional:      Appearance: She is well-developed.  HENT:     Head: Normocephalic and atraumatic.  Cardiovascular:     Rate and Rhythm: Normal rate and regular rhythm.     Heart sounds: No murmur heard. Pulmonary:     Effort: Pulmonary effort is normal. No respiratory distress.     Comments: Occasional expiratory wheeze in right lung fields, decreased air movement in the left lung  fields. Abdominal:     Palpations: Abdomen is soft.     Tenderness: There is no abdominal tenderness. There is no guarding or rebound.  Musculoskeletal:        General: No tenderness.  Skin:    General: Skin is warm and dry.  Neurological:     Mental Status: She is alert and oriented to person, place, and time.  Psychiatric:        Behavior: Behavior normal.     ED Results / Procedures / Treatments   Labs (all labs ordered are listed, but only abnormal results are displayed) Labs Reviewed  BASIC METABOLIC PANEL - Abnormal; Notable for the following components:      Result Value   Glucose, Bld 133 (*)    Calcium  8.8 (*)    All other components within normal limits  CBC WITH DIFFERENTIAL/PLATELET  TROPONIN I (HIGH SENSITIVITY)    EKG EKG Interpretation Date/Time:  Saturday June 22 2023 23:24:05 EST Ventricular Rate:  98 PR Interval:  161 QRS Duration:  91 QT Interval:  334 QTC Calculation: 427 R Axis:   -67  Text Interpretation: Sinus rhythm Probable left atrial enlargement Left anterior fascicular block Probable anteroseptal infarct, old Confirmed by Griselda Norris [phone]) on 06/22/2023 11:48:09 PM  Radiology DG Chest Port 1 View Result Date: 06/23/2023 CLINICAL DATA:  Chest pain, diagnosed with flu a few days ago. EXAM: PORTABLE CHEST 1 VIEW COMPARISON:  PA Lat chest 235 FINDINGS: There is mild cardiomegaly but no evidence of CHF. The mediastinum is normally outlined. The lungs are clear. The sulci are sharp. Slight thoracic dextroscoliosis were no new osseous findings. Artifacts from overlying clothing and wires. IMPRESSION: No evidence of acute chest disease. Mild cardiomegaly. Electronically Signed   By: Francis Quam M.D.   On: 06/23/2023 03:44    Procedures Procedures    Medications Ordered in ED Medications  ipratropium-albuterol  (DUONEB) 0.5-2.5 (3) MG/3ML nebulizer solution 3 mL (3 mLs Nebulization Given 06/23/23 0115)    ED Course/ Medical Decision  Making/ A&P                                 Medical Decision Making Amount and/or Complexity of Data Reviewed Labs: ordered. Radiology: ordered.  Risk Prescription drug management.   Patient with history of COPD/asthma, remote history of provoked PE not on anticoagulation here for evaluation of increased tightness in her chest and shortness of breath in setting of recent flu infection.  She is just completing a prednisone  burst.  On examination she does have expiratory wheezes intermittently.  No respiratory distress.  Chest x-ray is negative for acute pneumonia.  Troponin  is negative, EKG is nonischemic.  After albuterol  treatment she has good air movement bilaterally, minimal wheezes.  Current clinical picture is not consistent with PE, ACS, pneumonia.  Will prescribe a longer steroid taper for asthma exacerbation in setting of acute influenza.  Feel she is stable for discharge home with outpatient follow-up and return precautions.        Final Clinical Impression(s) / ED Diagnoses Final diagnoses:  Moderate persistent asthma with exacerbation    Rx / DC Orders ED Discharge Orders          Ordered    methylPREDNISolone  (MEDROL  DOSEPAK) 4 MG TBPK tablet        06/23/23 0427              Griselda Norris, MD 06/23/23 0500

## 2023-06-26 ENCOUNTER — Other Ambulatory Visit: Payer: Self-pay | Admitting: Family Medicine

## 2023-06-26 MED ORDER — ARNUITY ELLIPTA 100 MCG/ACT IN AEPB: INHALATION_SPRAY | RESPIRATORY_TRACT | 5 refills | Status: AC

## 2023-06-28 NOTE — Telephone Encounter (Signed)
Insurance prefers ARNUITY ELLIPTA which was sent into pharmacy 06/26/23- Patient aware

## 2023-07-05 ENCOUNTER — Inpatient Hospital Stay: Payer: Medicare HMO | Admitting: Family Medicine

## 2023-07-09 ENCOUNTER — Ambulatory Visit: Payer: Medicare HMO | Admitting: Allergy and Immunology

## 2023-07-09 VITALS — BP 136/88 | HR 98 | Temp 98.5°F | Resp 16 | Ht 62.8 in | Wt 204.6 lb

## 2023-07-09 DIAGNOSIS — K219 Gastro-esophageal reflux disease without esophagitis: Secondary | ICD-10-CM | POA: Diagnosis not present

## 2023-07-09 DIAGNOSIS — J3089 Other allergic rhinitis: Secondary | ICD-10-CM | POA: Diagnosis not present

## 2023-07-09 DIAGNOSIS — L989 Disorder of the skin and subcutaneous tissue, unspecified: Secondary | ICD-10-CM

## 2023-07-09 DIAGNOSIS — J4540 Moderate persistent asthma, uncomplicated: Secondary | ICD-10-CM

## 2023-07-09 MED ORDER — ALBUTEROL SULFATE (2.5 MG/3ML) 0.083% IN NEBU: 2.5000 mg | INHALATION_SOLUTION | RESPIRATORY_TRACT | 1 refills | Status: DC | PRN

## 2023-07-09 MED ORDER — SPACER/AERO-HOLDING CHAMBERS DEVI: 1.0000 | 1 refills | Status: DC

## 2023-07-09 MED ORDER — BUDESONIDE-FORMOTEROL FUMARATE 160-4.5 MCG/ACT IN AERO: 2.0000 | INHALATION_SPRAY | Freq: Two times a day (BID) | RESPIRATORY_TRACT | 1 refills | Status: DC

## 2023-07-09 MED ORDER — RYALTRIS 665-25 MCG/ACT NA SUSP
2.0000 | Freq: Two times a day (BID) | NASAL | 1 refills | Status: AC
Start: 2023-07-09 — End: ?

## 2023-07-09 MED ORDER — EPINEPHRINE 0.3 MG/0.3ML IJ SOAJ: 0.3000 mg | Freq: Once | INTRAMUSCULAR | 1 refills | Status: DC | PRN

## 2023-07-09 MED ORDER — MOMETASONE FUROATE 0.1 % EX OINT: TOPICAL_OINTMENT | Freq: Every day | CUTANEOUS | 0 refills | Status: AC

## 2023-07-09 MED ORDER — FAMOTIDINE 40 MG PO TABS: 40.0000 mg | ORAL_TABLET | Freq: Every evening | ORAL | 1 refills | Status: DC

## 2023-07-09 MED ORDER — ALBUTEROL SULFATE HFA 108 (90 BASE) MCG/ACT IN AERS: 2.0000 | INHALATION_SPRAY | RESPIRATORY_TRACT | 1 refills | Status: DC | PRN

## 2023-07-09 MED ORDER — OMEPRAZOLE 40 MG PO CPDR: 40.0000 mg | DELAYED_RELEASE_CAPSULE | Freq: Every morning | ORAL | 1 refills | Status: DC

## 2023-07-09 MED ORDER — CETIRIZINE HCL 10 MG PO TABS: 10.0000 mg | ORAL_TABLET | Freq: Every day | ORAL | 1 refills | Status: AC | PRN

## 2023-07-09 NOTE — Patient Instructions (Addendum)
  1. Treat and prevent inflammation of airway:   A. Symbicort 160 - 2 inhalations 2 times per day w/ spacer (empty lungs)  B. Ryaltris - 2 sprays each nostril 1-2 times per day  2. Treat and prevent reflux:   A. Omeprazole 40 mg - 1 tablet in AM  B. Famotidine 40 mg - 1 tablet in PM  3. Treat inflammation of skin:   A. Mometasone 0.1% ointment - apply to rash 1 time per day  4. If needed:   A. Albuterol HFA - 2 inhalations every 4-6 hours  B. Albuterol nebulization - every 4-6 hours  C. Cetirizine 10 mg - 1 tablet 1 time per day  D. Epi-Pen, benadryl, MD/ER evaluation for allergic reaction  5. Return to clinic in 4 weeks or earlier if problem

## 2023-07-09 NOTE — Progress Notes (Unsigned)
 [place] - [place] - [place] - [name] -    Follow-up Note  Referring Provider: Babs Sciara, MD Primary Provider: Babs Sciara, MD Date of Office Visit: 07/09/2023  Subjective:   Stephanie [name] (DOB: [birth year]) is a 63 y.o. female who [facility] in re-evaluation of the following:  HPI: Stephanie [name] returns to this clinic in evaluation of asthma, allergic rhinitis, reflux, history of nonsteroidal anti-inflammatory drug induced anaphylaxis.  I last saw her in this clinic 20 June 2021.  Apparently she contracted COVID November 2023 and has had problems ever since with more activity of her asthma and her upper airway issue.  She really had a difficult time with her airway having a requirement to visit the emergency room 3 times this winter with what appears to be viral induced flares of her asthma.  She has constant wheezing and coughing and her bronchodilator requirement is just about every day at this point in time.  She does have some nasal congestion and sneezing as well.  She has no anosmia or ugly nasal discharge or ugly sputum production or chest pain.  She is not using any controller agents for either her upper or lower airway.  In addition, she has been having very bad reflux with regurgitation and burning up in her throat.  She does not use any therapy for her reflux.  She is drinks 1 coffee per day.  She is also developed a rash over the course of the past week that is been red and itchy affecting mostly her neck area and anterior chest.  It might be a little bit better at this point in time while she applies some over-the-counter agents to this rash.  Allergies as of 07/09/2023       Reactions   Amoxicillin Rash   fash swelled up and itching 04/2018   Aspirin Anaphylaxis   Other Anaphylaxis   Nsaids   Hydrocodone Itching      Latex Hives, Itching   Oxycodone    itching   Levaquin [levofloxacin]    Rash on  chest no hives 04/2018        Medication List    albuterol 108 (90 Base) MCG/ACT inhaler Commonly known as: Proventil HFA Inhale 2 puffs into the lungs every 4 (four) hours as needed for wheezing or shortness of breath.   albuterol (2.5 MG/3ML) 0.083% nebulizer solution Commonly known as: PROVENTIL Take 3 mLs (2.5 mg total) by nebulization every 4 (four) hours as needed for wheezing or shortness of breath.   albuterol 108 (90 Base) MCG/ACT inhaler Commonly known as: VENTOLIN HFA Inhale 2 puffs into the lungs every 4 (four) hours as needed for wheezing or shortness of breath.   amLODipine 5 MG tablet Commonly known as: NORVASC Take 1 tablet (5 mg total) by mouth daily.   EPINEPHrine 0.3 mg/0.3 mL Soaj injection Commonly known as: EpiPen 2-Pak Inject 0.3 mg into the muscle once as needed for anaphylaxis (allergic reaction).   GARLIC PO Take 1 tablet by mouth daily.   omeprazole 40 MG capsule Commonly known as: PRILOSEC Take 1 capsule (40 mg total) by mouth daily.   True Metrix Blood Glucose Test test strip Generic drug: glucose blood SMARTSIG:Via Meter   TRUEplus Lancets 33G Misc   vitamin C 1000 MG tablet Take 1,000 mg by mouth daily.   Vitamin D3 50 MCG (2000 UT) Tabs Take by mouth daily at 6 (six) AM.   vitamin  E 180 MG (400 UNITS) capsule Take 400 Units by mouth daily.   Zinc 50 MG Caps Take 1 capsule by mouth daily at 6 (six) AM.      Past Medical History:  Diagnosis Date  . Allergic rhinitis   . Allergy   . Arthritis   . Asthma   . Depression   . DVT (deep venous thrombosis) [facility])    August 2023  . GERD (gastroesophageal reflux disease)   . Heart murmur   . Hypertension   . Pneumonia    hx of x 3     Past Surgical History:  Procedure Laterality Date  . CESAREAN SECTION  1985, 1989, 1995  . TONSILLECTOMY    . TOTAL HIP ARTHROPLASTY Right 03/27/2017   Procedure: RIGHT TOTAL HIP ARTHROPLASTY ANTERIOR APPROACH;  Surgeon: Ollen Gross, MD;   Location: [facility];  Service: Orthopedics;  Laterality: Right;  . TOTAL HIP ARTHROPLASTY Left 08/17/2020   Procedure: TOTAL HIP ARTHROPLASTY ANTERIOR APPROACH;  Surgeon: Ollen Gross, MD;  Location: [facility];  Service: Orthopedics;  Laterality: Left;     Review of systems negative except as noted in HPI / PMHx or noted below:  Review of Systems  Constitutional: Negative.   HENT: Negative.    Eyes: Negative.   Respiratory: Negative.    Cardiovascular: Negative.   Gastrointestinal: Negative.   Genitourinary: Negative.   Musculoskeletal: Negative.   Skin: Negative.   Neurological: Negative.   Endo/Heme/Allergies: Negative.   Psychiatric/Behavioral: Negative.       Objective:   Vitals:   07/09/23 0914  BP: 136/88  Pulse: 98  Resp: 16  Temp: 98.5 F (36.9 C)  SpO2: 96%   Height: 5' 2.8" (159.5 cm)  Weight: 204 lb 9.6 oz (92.8 kg)   Physical Exam Constitutional:      Appearance: She is not diaphoretic.  HENT:     Head: Normocephalic.     Right Ear: Tympanic membrane, ear canal and external ear normal.     Left Ear: Tympanic membrane, ear canal and external ear normal.     Nose: Nose normal. No mucosal edema or rhinorrhea.     Mouth/Throat:     Pharynx: Uvula midline. No oropharyngeal exudate.  Eyes:     Conjunctiva/sclera: Conjunctivae normal.  Neck:     Thyroid: No thyromegaly.     Trachea: Trachea normal. No tracheal tenderness or tracheal deviation.  Cardiovascular:     Rate and Rhythm: Normal rate and regular rhythm.     Heart sounds: Normal heart sounds, S1 normal and S2 normal. No murmur heard. Pulmonary:     Effort: No respiratory distress.     Breath sounds: Normal breath sounds. No stridor. No wheezing or rales.  Lymphadenopathy:     Head:     Right side of head: No tonsillar adenopathy.     Left side of head: No tonsillar adenopathy.     Cervical: No cervical adenopathy.  Skin:    Findings: No erythema or rash.     Nails: There is no clubbing.   Neurological:     Mental Status: She is alert.    Diagnostics:    Spirometry was performed and demonstrated an FEV1 of 1.53 at 78 % of predicted.  The patient had an Asthma Control Test with the following results:  .    Assessment and Plan:   No diagnosis found.  Patient Instructions   1. Treat and prevent inflammation of airway:   A. Symbicort 160 - 2  inhalations 2 times per day w/ spacer (empty lungs)  B. Ryaltris - 2 sprays each nostril 1-2 times per day  2. Treat and prevent reflux:   A. Omeprazole 40 mg - 1 tablet in AM  B. Famotidine 40 mg - 1 tablet in PM  3. Treat inflammation of skin:   A. Mometasone 0.1% ointment - apply to rash 1 time per day  4. If needed:   A. Albuterol HFA - 2 inhalations every 4-6 hours  B. Albuterol nebulization - every 4-6 hours  C. Cetirizine 10 mg - 1 tablet 1 time per day  D. Epi-Pen, benadryl, MD/ER evaluation for allergic reaction  5. Return to clinic in 4 weeks or earlier if problem    Laurette Schimke, MD Allergy / Immunology [facility]

## 2023-07-10 ENCOUNTER — Encounter: Payer: Self-pay | Admitting: Allergy and Immunology

## 2023-07-19 ENCOUNTER — Encounter: Payer: Self-pay | Admitting: Family Medicine

## 2023-07-19 ENCOUNTER — Ambulatory Visit: Payer: Medicare HMO | Admitting: Family Medicine

## 2023-07-19 VITALS — BP 126/86 | HR 96 | Temp 98.4°F | Ht 62.8 in | Wt 203.0 lb

## 2023-07-19 DIAGNOSIS — N950 Postmenopausal bleeding: Secondary | ICD-10-CM

## 2023-07-19 DIAGNOSIS — A084 Viral intestinal infection, unspecified: Secondary | ICD-10-CM

## 2023-07-19 DIAGNOSIS — M21612 Bunion of left foot: Secondary | ICD-10-CM | POA: Diagnosis not present

## 2023-07-19 DIAGNOSIS — R0789 Other chest pain: Secondary | ICD-10-CM

## 2023-07-19 NOTE — Progress Notes (Signed)
 Subjective:    Patient ID: Stephanie [name], female    DOB: [birth year], 62 y.o.   MRN: [id number]  Discussed the use of AI scribe software for clinical note transcription with the patient, who gave verbal consent to proceed.  History of Present Illness   The patient presents with loose stools and abdominal cramps.  The diarrhea began on Tuesday and has shown some improvement, but she continues to experience discomfort when consuming regular food. She has been managing her symptoms by eating soups and crackers. No nausea, vomiting, fever, or chills are present.  She has been to the emergency room three times due to chest pain, tingling in her arm, and facial numbness, with the most recent visit on February 20th. She describes the chest pain as severe and accompanied by tingling over her heart and difficulty breathing. Previous tests showed no acute cardiac issues.  She has a history of breathing difficulties following a flu episode, which has improved. She visited an allergy specialist last week and was noted to have a rash. Her current medications include albuterol as needed, a combination inhaler twice a day, and an allergy tablet.  She experienced light vaginal spotting, which resolved by February 4th. She has not had a menstrual cycle in about ten years.  She reports a bunion on her big toe that causes pain. She has seen a podiatrist who provided some relief by removing some material from the foot.  She mentions that her mood has been 'terrible' due to stress from family issues, but she feels it is improving as the situation has been addressed.         Review of Systems     Objective:    Physical Exam     General-in no acute distress Eyes-no discharge Lungs-respiratory rate normal, CTA CV-no murmurs,RRR Extremities skin warm dry no edema Neuro grossly normal Behavior normal, alert Bunion noted on the left foot          Assessment & Plan:  Assessment and Plan     Acute Diarrhea No associated nausea, vomiting, fever, or chills. Cramping present. Diarrhea has improved since Tuesday, but still present with heavier meals. Patient has been maintaining a diet of soups and crackers. -Gradually reintroduce solid foods starting with peanut butter crackers, soft fruits, and vegetables. -Add yogurt to diet for probiotics. -Continue monitoring symptoms and return to regular diet by Monday if tolerated.  Asthma Patient reports improved breathing since recent flu episode. Currently on albuterol as needed and a combination inhaler twice daily. -Continue current medication regimen. -Prepare for potential exacerbation with upcoming spring season.  Bunion Patient reports pain and locking of the big toe due to bunion. -Consider referral to a podiatrist if pain increases. -Discussed potential surgical intervention if symptoms worsen.  Abnormal Vaginal Bleeding Patient reports spotting from the vagina, which has since resolved. Last episode was on February 4th. -Order an ultrasound of the uterus to assess endometrial thickness. -Refer to gynecology for further evaluation and potential endometrial biopsy.  Chest Pain Patient reports a recent episode of severe chest pain with associated tingling over the heart and numbness in the arm and face. -Refer to cardiology for further evaluation. -Consider coronary calcium scan to assess for coronary artery disease.  Mood Disorder Patient reports recent stress and mood disturbances due to family issues, which are reportedly improving. -Continue monitoring mood and stress levels. -Provide support and consider referral to mental health services if symptoms worsen.     Recent influenza resolving  1. Postmenopausal bleeding (Primary) Very important to be seen by gynecology to get ultrasound done to rule out the possibility of uterine thickening and the possibility of endometrial cancer patient understands - Ambulatory  referral to Gynecology  2. Bunion, left Patient under the care of podiatry she states that this ongoing she may consider to have surgery but for now holding off  3. Other chest pain Patient relates intermittent chest pressure pain her previous CT scan showed minimal coronary calcification but was not quantified test because she has ongoing pains and discomforts and shortness of breath along with left arm pain with the chest pain cardiology consultation reasonable - Ambulatory referral to Cardiology  4. Viral gastroenteritis Viral gastroenteritis should resolve over the next few days

## 2023-07-24 ENCOUNTER — Encounter: Payer: Self-pay | Admitting: Family Medicine

## 2023-07-25 NOTE — Telephone Encounter (Signed)
 Information forwarded to referral staff

## 2023-07-25 NOTE — Telephone Encounter (Signed)
 Nurses-please connect with the referral team so they know her preference for cardiologist This is in reference to her referral on 07/19/2023 regarding chest pain cardiology thank you

## 2023-08-02 DIAGNOSIS — N950 Postmenopausal bleeding: Secondary | ICD-10-CM | POA: Diagnosis not present

## 2023-08-06 ENCOUNTER — Ambulatory Visit (INDEPENDENT_AMBULATORY_CARE_PROVIDER_SITE_OTHER): Payer: Medicare HMO | Admitting: Allergy and Immunology

## 2023-08-06 ENCOUNTER — Encounter: Payer: Self-pay | Admitting: Allergy and Immunology

## 2023-08-06 ENCOUNTER — Other Ambulatory Visit: Payer: Self-pay

## 2023-08-06 VITALS — BP 116/80 | HR 80 | Temp 97.4°F | Resp 16 | Ht 64.0 in | Wt 201.6 lb

## 2023-08-06 DIAGNOSIS — J4540 Moderate persistent asthma, uncomplicated: Secondary | ICD-10-CM

## 2023-08-06 DIAGNOSIS — L989 Disorder of the skin and subcutaneous tissue, unspecified: Secondary | ICD-10-CM

## 2023-08-06 DIAGNOSIS — K219 Gastro-esophageal reflux disease without esophagitis: Secondary | ICD-10-CM

## 2023-08-06 DIAGNOSIS — J3089 Other allergic rhinitis: Secondary | ICD-10-CM

## 2023-08-06 NOTE — Patient Instructions (Addendum)
  1. Treat and prevent inflammation of airway:   A. Symbicort 160 - 2 inhalations 2 times per day w/ spacer (empty lungs)  B. Ryaltris - 2 sprays each nostril 1-2 times per day  2. Treat and prevent reflux:   A. Omeprazole 40 mg - 1 tablet in AM  B. Famotidine 40 mg - 1 tablet in PM  3. If needed:   A. Albuterol HFA - 2 inhalations every 4-6 hours  B. Albuterol nebulization - every 4-6 hours  C. Cetirizine 10 mg - 1 tablet 1 time per day  D. Epi-Pen, benadryl, MD/ER evaluation for allergic reaction  F. Mometasone 0.1% ointment - apply to rash 1 time per day  4. Return to clinic in summer 2025 or earlier if problem  5. Influenza = Tamiflu. Covid = Paxlovid

## 2023-08-06 NOTE — Progress Notes (Unsigned)
 [place] - [place] - [place] - [name] -    Follow-up Note  Referring Provider: Babs Sciara, MD Primary Provider: Babs Sciara, MD Date of Office Visit: 08/06/2023  Subjective:   Stephanie [name] (DOB: 04/03/1961) is a 63 y.o. female who [facility] 08[facility]/2023 in re-evaluation of the following:  HPI: Davian returns to this clinic in evaluation of asthma, allergic rhinitis, reflux, and history of nonsteroidal anti-inflammatory drug induced anaphylaxis.  I last saw her in this clinic 09 July 2023.  During her last visit she was having significant inflammation of both her upper and lower airway and having very bad reflux and developed a inflammatory dermatosis.  We addressed each 1 of these issues and she is much better at this point.  She has no issues with her asthma and does not need to use the short acting bronchodilator.  She has no issues with her nose and can smell and taste without any problem.  She continues on a combination of Symbicort and Flonase.  She has no issues with her reflux at this point.  She continues on a combination of her proton pump inhibitor and H2 receptor blocker.  Her inflammatory dermatosis has resolved and she does not require any topical steroid.  Allergies as of 08/06/2023       Reactions   Amoxicillin Rash   fash swelled up and itching 04/2018   Aspirin Anaphylaxis   Other Anaphylaxis   Nsaids   Hydrocodone Itching      Latex Hives, Itching   Oxycodone    itching   Levaquin [levofloxacin]    Rash on chest no hives 04/2018        Medication List    albuterol (2.5 MG/3ML) 0.083% nebulizer solution Commonly known as: PROVENTIL Take 3 mLs (2.5 mg total) by nebulization every 4 (four) hours as needed for wheezing or shortness of breath.   albuterol 108 (90 Base) MCG/ACT inhaler Commonly known as: VENTOLIN HFA Inhale 2 puffs into the lungs every 4 (four) hours as needed for  wheezing or shortness of breath.   amLODipine 5 MG tablet Commonly known as: NORVASC Take 1 tablet (5 mg total) by mouth daily.   Arnuity Ellipta 100 MCG/ACT Aepb Generic drug: Fluticasone Furoate 1 inhalation daily   budesonide-formoterol 160-4.5 MCG/ACT inhaler Commonly known as: Symbicort Inhale 2 puffs into the lungs 2 (two) times daily.   cetirizine 10 MG tablet Commonly known as: ZYRTEC Take 1 tablet (10 mg total) by mouth daily as needed (Can take an extra dose during flare ups.).   EPINEPHrine 0.3 mg/0.3 mL Soaj injection Commonly known as: EpiPen 2-Pak Inject 0.3 mg into the muscle once as needed for anaphylaxis (allergic reaction).   famotidine 40 MG tablet Commonly known as: PEPCID Take 1 tablet (40 mg total) by mouth at bedtime.   GARLIC PO Take 1 tablet by mouth daily.   mometasone 0.1 % ointment Commonly known as: ELOCON Apply topically daily. Apply to rash   omeprazole 40 MG capsule Commonly known as: PRILOSEC Take 1 capsule (40 mg total) by mouth in the morning.   Ryaltris 161-09 MCG/ACT Susp Generic drug: Olopatadine-Mometasone Place 2 sprays into both nostrils 2 (two) times daily.   Spacer/Aero-Holding [employer] 1 Device by Does not apply route as directed.   True Metrix Blood Glucose Test test strip Generic drug: glucose blood SMARTSIG:Via Meter   TRUEplus Lancets 33G Misc   vitamin C 1000 MG tablet Take  1,000 mg by mouth daily.   Vitamin D3 50 MCG (2000 UT) Tabs Take by mouth daily at 6 (six) AM.   vitamin E 180 MG (400 UNITS) capsule Take 400 Units by mouth daily.   Zinc 50 MG Caps Take 1 capsule by mouth daily at 6 (six) AM.    Past Medical History:  Diagnosis Date   Allergic rhinitis    Allergy    Arthritis    Asthma    Depression    DVT (deep venous thrombosis) (HCC)    August 2023   GERD (gastroesophageal reflux disease)    Heart murmur    Hypertension    Pneumonia    hx of x 3     Past Surgical History:   Procedure Laterality Date   CESAREAN SECTION  1985, 1989, 1995   TONSILLECTOMY     TOTAL HIP ARTHROPLASTY Right 03/27/2017   Procedure: RIGHT TOTAL HIP ARTHROPLASTY ANTERIOR APPROACH;  Surgeon: Ollen Gross, MD;  Location: [facility];  Service: Orthopedics;  Laterality: Right;   TOTAL HIP ARTHROPLASTY Left 08/17/2020   Procedure: TOTAL HIP ARTHROPLASTY ANTERIOR APPROACH;  Surgeon: Ollen Gross, MD;  Location: [facility];  Service: Orthopedics;  Laterality: Left;     Review of systems negative except as noted in HPI / PMHx or noted below:  Review of Systems  Constitutional: Negative.   HENT: Negative.    Eyes: Negative.   Respiratory: Negative.    Cardiovascular: Negative.   Gastrointestinal: Negative.   Genitourinary: Negative.   Musculoskeletal: Negative.   Skin: Negative.   Neurological: Negative.   Endo/Heme/Allergies: Negative.   Psychiatric/Behavioral: Negative.       Objective:   Vitals:   08/06/23 1631  BP: 116/80  Pulse: 80  Resp: 16  Temp: (!) 97.4 F (36.3 C)  SpO2: 96%   Height: 5\' 4"  (162.6 cm)  Weight: 201 lb 9.6 oz (91.4 kg)   Physical Exam Constitutional:      Appearance: She is not diaphoretic.  HENT:     Head: Normocephalic.     Right Ear: Tympanic membrane, ear canal and external ear normal.     Left Ear: Tympanic membrane, ear canal and external ear normal.     Nose: Nose normal. No mucosal edema or rhinorrhea.     Mouth/Throat:     Pharynx: Uvula midline. No oropharyngeal exudate.  Eyes:     Conjunctiva/sclera: Conjunctivae normal.  Neck:     Thyroid: No thyromegaly.     Trachea: Trachea normal. No tracheal tenderness or tracheal deviation.  Cardiovascular:     Rate and Rhythm: Normal rate and regular rhythm.     Heart sounds: Normal heart sounds, S1 normal and S2 normal. No murmur heard. Pulmonary:     Effort: No respiratory distress.     Breath sounds: Normal breath sounds. No stridor. No wheezing or rales.  Lymphadenopathy:      Head:     Right side of head: No tonsillar adenopathy.     Left side of head: No tonsillar adenopathy.     Cervical: No cervical adenopathy.  Skin:    Findings: No erythema or rash.     Nails: There is no clubbing.  Neurological:     Mental Status: She is alert.     Diagnostics: Spirometry was performed.  Her FEV1 was 1.56 which was 79% of predicted.  Assessment and Plan:   1. Asthma, moderate persistent, well-controlled   2. Perennial allergic rhinitis   3. Gastroesophageal reflux disease,  unspecified whether esophagitis present   4. Inflammatory dermatosis     1. Treat and prevent inflammation of airway:   A. Symbicort 160 - 2 inhalations 2 times per day w/ spacer (empty lungs)  B. Ryaltris - 2 sprays each nostril 1-2 times per day  2. Treat and prevent reflux:   A. Omeprazole 40 mg - 1 tablet in AM  B. Famotidine 40 mg - 1 tablet in PM  3. If needed:   A. Albuterol HFA - 2 inhalations every 4-6 hours  B. Albuterol nebulization - every 4-6 hours  C. Cetirizine 10 mg - 1 tablet 1 time per day  D. Epi-Pen, benadryl, MD/ER evaluation for allergic reaction  F. Mometasone 0.1% ointment - apply to rash 1 time per day  4. Return to clinic in summer 2025 or earlier if problem  5. Influenza = Tamiflu. Covid = Paxlovid  Zeanna is doing much better at this point in time regarding her upper airway, lower airway, throat, esophagus, and skin.  She will continue on the plan to address respiratory tract inflammation and reflux induced respiratory disease and she has a selection of agents to be utilized should they be required as noted above.  I will see her back in this clinic in the summer 2025 or earlier if there is a problem.  Laurette Schimke, MD Allergy / Immunology [facility]

## 2023-08-07 ENCOUNTER — Encounter: Payer: Self-pay | Admitting: Allergy and Immunology

## 2023-09-04 ENCOUNTER — Ambulatory Visit: Admitting: Podiatry

## 2023-09-04 DIAGNOSIS — B351 Tinea unguium: Secondary | ICD-10-CM | POA: Diagnosis not present

## 2023-09-04 DIAGNOSIS — M79674 Pain in right toe(s): Secondary | ICD-10-CM | POA: Diagnosis not present

## 2023-09-04 DIAGNOSIS — D2372 Other benign neoplasm of skin of left lower limb, including hip: Secondary | ICD-10-CM | POA: Diagnosis not present

## 2023-09-04 DIAGNOSIS — M79675 Pain in left toe(s): Secondary | ICD-10-CM

## 2023-09-15 ENCOUNTER — Other Ambulatory Visit: Payer: Self-pay | Admitting: Family Medicine

## 2023-09-16 ENCOUNTER — Other Ambulatory Visit: Payer: Self-pay

## 2023-09-16 MED ORDER — AMLODIPINE BESYLATE 5 MG PO TABS: 5.0000 mg | ORAL_TABLET | Freq: Every day | ORAL | 5 refills | Status: DC

## 2023-09-26 NOTE — Progress Notes (Signed)
   Chief Complaint  Patient presents with   Callouses    callus bottom of bilateral feet and nail trim    SUBJECTIVE Patient presents to office today complaining of elongated, thickened nails that cause pain while ambulating in shoes.  Patient is unable to trim their own nails without pain.  Patient also states that she has developed symptomatic lesions to the plantar aspect of the bilateral feet.  Exquisitely painful with walking without shoes.  Patient is here for further evaluation and treatment.  Past Medical History:  Diagnosis Date   Allergic rhinitis    Allergy     Arthritis    Asthma    Depression    DVT (deep venous thrombosis) (HCC)    August 2023   GERD (gastroesophageal reflux disease)    Heart murmur    Hypertension    Pneumonia    hx of x 3     Allergies  Allergen Reactions   Amoxicillin  Rash    fash swelled up and itching 04/2018   Aspirin Anaphylaxis   Other Anaphylaxis    Nsaids   Hydrocodone  Itching        Latex Hives and Itching   Oxycodone      itching   Levaquin  [Levofloxacin ]     Rash on chest no hives 04/2018     OBJECTIVE General Patient is awake, alert, and oriented x 3 and in no acute distress. Derm Skin is dry and supple bilateral. Negative open lesions or macerations. Remaining integument unremarkable. Nails are tender, long, thickened and dystrophic with subungual debris, consistent with onychomycosis, 1-5 bilateral. No signs of infection noted.  Hyperkeratotic skin lesions noted with a central nucleated core to the weightbearing surface of bilateral feet and associated tenderness Vasc  DP and PT pedal pulses palpable bilaterally. Temperature gradient within normal limits.  Neuro light touch and protective threshold sensation grossly intact bilaterally.  Musculoskeletal Exam No symptomatic pedal deformities noted bilateral. Muscular strength within normal limits.  ASSESSMENT 1.  Pain due to onychomycosis of toenails both 2.  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  -Instructed to maintain good pedal hygiene and foot care.  -Mechanical debridement of nails 1-5 bilaterally performed using a nail nipper. Filed with dremel without incident.  -Excisional debridement of hyperkeratotic eccrine poroma lesions was performed today using a 312 scalpel without incident or bleeding.  Salicylic acid and Band-Aid applied. -Return to clinic as needed   Dot Gazella, DPM [facility]  Dr. Dot Gazella, DPM    [address] [place], [place] [address]                Office [phone]  Fax [phone]

## 2023-10-18 ENCOUNTER — Ambulatory Visit

## 2023-10-18 VITALS — Ht 64.0 in | Wt 201.0 lb

## 2023-10-18 DIAGNOSIS — Z0000 Encounter for general adult medical examination without abnormal findings: Secondary | ICD-10-CM

## 2023-10-18 DIAGNOSIS — Z1231 Encounter for screening mammogram for malignant neoplasm of breast: Secondary | ICD-10-CM

## 2023-10-18 NOTE — Progress Notes (Signed)
 Subjective:   Stephanie [name] is a 63 y.o. who presents for a Medicare Wellness preventive visit.  As a reminder, Annual Wellness Visits don't include a physical exam, and some assessments may be limited, especially if this visit is performed virtually. We may recommend an in-person follow-up visit with your provider if needed.  Visit Complete: Virtual I connected with  Stephanie [name] on 10/18/23 by a audio enabled telemedicine application and verified that I am speaking with the correct person using two identifiers.  Patient Location: Home  Provider Location: Home Office  I discussed the limitations of evaluation and management by telemedicine. The patient expressed understanding and agreed to proceed.  Vital Signs: Because this visit was a virtual/telehealth visit, some criteria may be missing or patient reported. Any vitals not documented were not able to be obtained and vitals that have been documented are patient reported.  VideoDeclined- This patient declined Librarian, academic. Therefore the visit was completed with audio only.  Persons Participating in Visit: Patient.  AWV Questionnaire: No: Patient Medicare AWV questionnaire was not completed prior to this visit.  Cardiac Risk Factors include: sedentary lifestyle;hypertension     Objective:     Today's Vitals   10/18/23 0943  Weight: 201 lb (91.2 kg)  Height: 5\' 4"  (1.626 m)   Body mass index is 34.5 kg/m.     10/18/2023    9:50 AM 06/17/2023    8:23 PM 06/04/2023   12:10 AM 06/03/2023   11:21 PM 07/09/2022    2:29 PM 11/28/2021    4:48 PM 08/10/2021    9:50 AM  Advanced Directives  Does Patient Have a Medical Advance Directive? No No No No No No No  Would patient like information on creating a medical advance directive? Yes (MAU/Ambulatory/Procedural Areas - Information given) No - Patient declined No - Patient declined No - Patient declined   Yes (MAU/Ambulatory/Procedural Areas -  Information given)    Current Medications (verified) Outpatient Encounter Medications as of 10/18/2023  Medication Sig   albuterol  (PROVENTIL ) (2.5 MG/3ML) 0.083% nebulizer solution Take 3 mLs (2.5 mg total) by nebulization every 4 (four) hours as needed for wheezing or shortness of breath.   albuterol  (VENTOLIN  HFA) 108 (90 Base) MCG/ACT inhaler Inhale 2 puffs into the lungs every 4 (four) hours as needed for wheezing or shortness of breath.   amLODipine  (NORVASC ) 5 MG tablet Take 1 tablet (5 mg total) by mouth daily.   Ascorbic Acid (VITAMIN C) 1000 MG tablet Take 1,000 mg by mouth daily.   budesonide -formoterol  (SYMBICORT ) 160-4.5 MCG/ACT inhaler Inhale 2 puffs into the lungs 2 (two) times daily.   cetirizine  (ZYRTEC ) 10 MG tablet Take 1 tablet (10 mg total) by mouth daily as needed (Can take an extra dose during flare ups.).   Cholecalciferol (VITAMIN D3) 50 MCG (2000 UT) TABS Take by mouth daily at 6 (six) AM.   EPINEPHrine  (EPIPEN  2-PAK) 0.3 mg/0.3 mL IJ SOAJ injection Inject 0.3 mg into the muscle once as needed for anaphylaxis (allergic reaction).   famotidine  (PEPCID ) 40 MG tablet Take 1 tablet (40 mg total) by mouth at bedtime.   Fluticasone  Furoate (ARNUITY ELLIPTA ) 100 MCG/ACT AEPB 1 inhalation daily   GARLIC PO Take 1 tablet by mouth daily.   mometasone  (ELOCON ) 0.1 % ointment Apply topically daily. Apply to rash   Olopatadine-Mometasone  (RYALTRIS ) 665-25 MCG/ACT SUSP Place 2 sprays into both nostrils 2 (two) times daily.   omeprazole  (PRILOSEC) 40 MG capsule Take 1  capsule (40 mg total) by mouth in the morning.   Spacer/Aero-Holding Chambers DEVI 1 Device by Does not apply route as directed.   TRUE METRIX BLOOD GLUCOSE TEST test strip SMARTSIG:Via Meter   TRUEplus Lancets 33G MISC    vitamin E 180 MG (400 UNITS) capsule Take 400 Units by mouth daily.   Zinc 50 MG CAPS Take 1 capsule by mouth daily at 6 (six) AM.   Facility-Administered Encounter Medications as of 10/18/2023   Medication   Benralizumab  SOSY 30 mg    Allergies (verified) Amoxicillin , Aspirin, Other, Hydrocodone , Latex, Oxycodone , and Levaquin  [levofloxacin ]   History: Past Medical History:  Diagnosis Date   Allergic rhinitis    Allergy     Arthritis    Asthma    Depression    DVT (deep venous thrombosis) (HCC)    August 2023   GERD (gastroesophageal reflux disease)    Heart murmur    Hypertension    Pneumonia    hx of x 3    Past Surgical History:  Procedure Laterality Date   CESAREAN SECTION  1985, 1989, 1995   JOINT REPLACEMENT  2018,2022   TONSILLECTOMY     TOTAL HIP ARTHROPLASTY Right 03/27/2017   Procedure: RIGHT TOTAL HIP ARTHROPLASTY ANTERIOR APPROACH;  Surgeon: Liliane Rei, MD;  Location: [facility];  Service: Orthopedics;  Laterality: Right;   TOTAL HIP ARTHROPLASTY Left 08/17/2020   Procedure: TOTAL HIP ARTHROPLASTY ANTERIOR APPROACH;  Surgeon: Liliane Rei, MD;  Location: [facility];  Service: Orthopedics;  Laterality: Left;    Family History  Problem Relation Age of Onset   Hypertension Mother    Arthritis Mother    Hypertension Father    Diabetes Father    Stomach cancer Niece    Breast cancer Niece    Arthritis Sister    Asthma Sister    Asthma Sister    Diabetes Sister    Diabetes Brother    Colon cancer Neg Hx    Esophageal cancer Neg Hx    Rectal cancer Neg Hx    Social History   Socioeconomic History   Marital status: Divorced    Spouse name: Not on file   Number of children: 3   Years of education: Not on file   Highest education level: Associate degree: occupational, Scientist, product/process development, or vocational program  Occupational History   Occupation: retired  Tobacco Use   Smoking status: Former    Current packs/day: 0.00    Average packs/day: 0.3 packs/day for 18.4 years (4.6 ttl pk-yrs)    Types: Cigarettes    Start date: 09/24/1994    Quit date: 03/06/2013    Years since quitting: 10.6    Passive exposure: Past   Smokeless tobacco: Never   Vaping Use   Vaping status: Never Used  Substance and Sexual Activity   Alcohol use: Not Currently    Alcohol/week: 2.0 - 3.0 standard drinks of alcohol    Types: 2 - 3 Glasses of wine per week   Drug use: No   Sexual activity: Not Currently    Birth control/protection: Post-menopausal  Other Topics Concern   Not on file  Social History Narrative   2 daughters and 1 son, visit frequently per pt.    Social Drivers of Health   Financial Resource Strain: Medium Risk (10/18/2023)   Overall Financial Resource Strain (CARDIA)    Difficulty of Paying Living Expenses: Somewhat hard  Food Insecurity: Food Insecurity Present (10/18/2023)   Hunger Vital Sign    Worried  About Running Out of Food in the Last Year: Sometimes true    Ran Out of Food in the Last Year: Sometimes true  Transportation Needs: No Transportation Needs (10/18/2023)   PRAPARE - Administrator, Civil Service (Medical): No    Lack of Transportation (Non-Medical): No  Physical Activity: Inactive (10/18/2023)   Exercise Vital Sign    Days of Exercise per Week: 0 days    Minutes of Exercise per Session: 0 min  Stress: Stress Concern Present (10/18/2023)   [employer] of Occupational Health - Occupational Stress Questionnaire    Feeling of Stress : To some extent  Social Connections: Moderately Integrated (10/18/2023)   Social Connection and Isolation Panel [NHANES]    Frequency of Communication with Friends and Family: More than three times a week    Frequency of Social Gatherings with Friends and Family: Once a week    Attends Religious Services: More than 4 times per year    Active Member of [employer] or Organizations: Yes    Attends Banker Meetings: 1 to 4 times per year    Marital Status: Divorced    Tobacco Counseling Counseling given: Not Answered    Clinical Intake:  Pre-visit preparation completed: Yes  Pain : No/denies pain     Diabetes: No  No results found for: "HGBA1C"    How often do you need to have someone help you when you read instructions, pamphlets, or other written materials from your doctor or pharmacy?: 1 - Never  Interpreter Needed?: No  Information entered by :: Seabron Cypress LPN   Activities of Daily Living     10/18/2023    9:49 AM  In your present state of health, do you have any difficulty performing the following activities:  Hearing? 0  Vision? 0  Difficulty concentrating or making decisions? 0  Walking or climbing stairs? 1  Dressing or bathing? 0  Doing errands, shopping? 1  Preparing Food and eating ? N  Using the Toilet? N  In the past six months, have you accidently leaked urine? N  Do you have problems with loss of bowel control? N  Managing your Medications? N  Managing your Finances? N  Housekeeping or managing your Housekeeping? N    Patient Care Team: Bennet Brasil, MD as PCP - General (Family Medicine) Jerelene Monday Rema Care, MD as Consulting Physician (Allergy  and Immunology) Dot Gazella, DPM as Consulting Physician (Podiatry) Dr [facility], OD  I have updated your Care Teams any recent Medical Services you may have received from other providers in the past year.     Assessment:    This is a routine wellness examination for Stephanie [name].  Hearing/Vision screen Hearing Screening - Comments:: Denies hearing difficulties   Vision Screening - Comments:: Wears rx glasses - up to date with routine eye exams with [facility])    Goals Addressed             This Visit's Progress    Exercise 3x per week (30 min per time)   Not on track    To improve health and stay heathy per pt.        Depression Screen     10/18/2023    9:47 AM 07/19/2023   11:54 AM 06/05/2023   10:53 AM 03/21/2023   11:10 AM 02/27/2023    1:49 PM 10/19/2022   11:17 AM 08/23/2022    5:24 PM  PHQ 2/9 Scores  PHQ -  2 Score 2 3 4 2 2 2 2   PHQ- 9 Score 11 17 15 11 15 11 14     Fall Risk     10/18/2023    9:48 AM  07/19/2023   11:54 AM 06/05/2023   10:54 AM 03/21/2023   11:10 AM 02/27/2023    1:49 PM  Fall Risk   Falls in the past year? 0 1 1 1 1   Number falls in past yr: 0 0 0 0 1  Injury with Fall? 0 0 0 0 0  Risk for fall due to : Impaired balance/gait;Impaired mobility  History of fall(s)    Follow up Falls prevention discussed;Education provided;Falls evaluation completed  Falls evaluation completed      MEDICARE RISK AT HOME:  Medicare Risk at Home Any stairs in or around the home?: No If so, are there any without handrails?: No Home free of loose throw rugs in walkways, pet beds, electrical cords, etc?: Yes Adequate lighting in your home to reduce risk of falls?: Yes Life alert?: No Use of a cane, walker or w/c?: Yes Grab bars in the bathroom?: Yes Shower chair or bench in shower?: No Elevated toilet seat or a handicapped toilet?: No  TIMED UP AND GO:  Was the test performed?  No  Cognitive Function: 6CIT completed        10/18/2023    9:49 AM 03/14/2021   10:17 AM  6CIT Screen  What Year? 0 points 0 points  What month? 0 points 0 points  What time? 0 points 0 points  Count back from 20 0 points 0 points  Months in reverse 0 points 0 points  Repeat phrase 0 points   Total Score 0 points     Immunizations Immunization History  Administered Date(s) Administered   Influenza, Seasonal, Injecte, Preservative Fre 02/27/2023   Influenza,inj,Quad PF,6+ Mos 02/21/2015, 04/20/2016, 03/23/2017, 07/02/2018, 03/03/2019, 03/07/2020, 02/08/2021, 03/09/2022   Influenza,inj,quad, With Preservative 02/12/2019   Influenza-Unspecified 02/04/2014   Moderna SARS-COV2 Booster Vaccination 04/14/2020   Moderna Sars-Covid-2 Vaccination 07/15/2019, 08/05/2019   PFIZER(Purple Top)SARS-COV-2 Vaccination 09/02/2019, 10/01/2019   PNEUMOCOCCAL CONJUGATE-20 03/22/2021   Pneumococcal Polysaccharide-23 03/03/2019    Screening Tests Health Maintenance  Topic Date Due   DTaP/Tdap/Td (1 - Tdap) Never  done   Zoster Vaccines- Shingrix  (1 of 2) Never done   COLON CANCER SCREENING ANNUAL FOBT  03/30/2021   COVID-19 Vaccine (6 - 2024-25 season) 01/13/2023   INFLUENZA VACCINE  12/13/2023   MAMMOGRAM  09/04/2024   Medicare Annual Wellness (AWV)  10/17/2024   Cervical Cancer Screening (HPV/Pap Cotest)  08/02/2026   Colonoscopy  08/29/2032   Pneumococcal Vaccine 14-68 Years old  Completed   Hepatitis C Screening  Completed   HIV Screening  Completed   HPV VACCINES  Aged Out   Meningococcal B Vaccine  Aged Out    Health Maintenance  Health Maintenance Due  Topic Date Due   DTaP/Tdap/Td (1 - Tdap) Never done   Zoster Vaccines- Shingrix  (1 of 2) Never done   COLON CANCER SCREENING ANNUAL FOBT  03/30/2021   COVID-19 Vaccine (6 - 2024-25 season) 01/13/2023   Health Maintenance Items Addressed: Mammogram ordered  Additional Screening:  Vision Screening: Recommended annual ophthalmology exams for early detection of glaucoma and other disorders of the eye. Would you like a referral to an eye doctor? No    Dental Screening: Recommended annual dental exams for proper oral hygiene  Community Resource Referral / Chronic Care Management: CRR required this visit?  No   CCM required this visit?  No   Plan:    I have personally reviewed and noted the following in the patient's chart:   Medical and social history Use of alcohol, tobacco or illicit drugs  Current medications and supplements including opioid prescriptions. Patient is not currently taking opioid prescriptions. Functional ability and status Nutritional status Physical activity Advanced directives List of other physicians Hospitalizations, surgeries, and ER visits in previous 12 months Vitals Screenings to include cognitive, depression, and falls Referrals and appointments  In addition, I have reviewed and discussed with patient certain preventive protocols, quality metrics, and best practice recommendations. A  written personalized care plan for preventive services as well as general preventive health recommendations were provided to patient.   Seabron Cypress [place], [place]   0/01/8118   After Visit Summary: (MyChart) Due to this being a telephonic visit, the after visit summary with patients personalized plan was offered to patient via MyChart   Notes: Nothing significant to report at this time.

## 2023-10-18 NOTE — Patient Instructions (Signed)
 Ms. Stephanie [name] , Thank you for taking time out of your busy schedule to complete your Annual Wellness Visit with me. I enjoyed our conversation and look forward to speaking with you again next year. I, as well as your care team,  appreciate your ongoing commitment to your health goals. Please review the following plan we discussed and let me know if I can assist you in the future. Your Game plan/ To Do List    Referrals:  You have an order for:  []   2D Mammogram  [x]   3D Mammogram  []   Bone Density     Please call for appointment:   [facility] [address] [place], [place] [address] [phone]   Make sure to wear two-piece clothing.  No lotions, powders, or deodorants the day of the appointment. Make sure to bring picture ID and insurance card.  Bring list of medications you are currently taking including any supplements.   Follow up Visits: Next Medicare AWV with our clinical staff: In 1 year    Have you seen your provider in the last 6 months (3 months if uncontrolled diabetes)? Yes Next Office Visit with your provider: To be scheduled   Clinician Recommendations:  Aim for 30 minutes of exercise or brisk walking, 6-8 glasses of water , and 5 servings of fruits and vegetables each day.       This is a list of the screening recommended for you and due dates:  Health Maintenance  Topic Date Due   DTaP/Tdap/Td vaccine (1 - Tdap) Never done   Zoster (Shingles) Vaccine (1 of 2) Never done   Stool Blood Test  03/30/2021   COVID-19 Vaccine (6 - 2024-25 season) 01/13/2023   Medicare Annual Wellness Visit  04/24/2023   Flu Shot  12/13/2023   Mammogram  09/04/2024   Pap with HPV screening  08/02/2026   Colon Cancer Screening  08/29/2032   Pneumococcal Vaccination  Completed   Hepatitis C Screening  Completed   HIV Screening  Completed   HPV Vaccine  Aged Out   Meningitis B Vaccine  Aged Out    Advanced directives: (ACP Link)Information on Advanced  Care Planning can be found at [employer]  Print production planner Health Care Directives Advance Health Care Directives. [email or web address]   Advance Care Planning is important because it:  [x]  Makes sure you receive the medical care that is consistent with your values, goals, and preferences  [x]  It provides guidance to your family and loved ones and reduces their decisional burden about whether or not they are making the right decisions based on your wishes.  Follow the link provided in your after visit summary or read over the paperwork we have mailed to you to help you started getting your Advance Directives in place. If you need assistance in completing these, please reach out to us  so that we can help you!  See attachments for Preventive Care and Fall Prevention Tips.

## 2023-10-21 ENCOUNTER — Emergency Department (HOSPITAL_COMMUNITY)

## 2023-10-21 ENCOUNTER — Emergency Department (HOSPITAL_COMMUNITY)
Admission: EM | Admit: 2023-10-21 | Discharge: 2023-10-21 | Disposition: A | Discharge status: Home / Self Care | Attending: Emergency Medicine | Admitting: Emergency Medicine

## 2023-10-21 ENCOUNTER — Encounter (HOSPITAL_COMMUNITY): Payer: Self-pay

## 2023-10-21 ENCOUNTER — Emergency Department (HOSPITAL_BASED_OUTPATIENT_CLINIC_OR_DEPARTMENT_OTHER): Admit: 2023-10-21 | Discharge: 2023-10-21 | Disposition: A | Discharge status: Home / Self Care

## 2023-10-21 ENCOUNTER — Other Ambulatory Visit: Payer: Self-pay

## 2023-10-21 ENCOUNTER — Telehealth: Payer: Self-pay | Admitting: *Deleted

## 2023-10-21 DIAGNOSIS — Z91040 Latex allergy status: Secondary | ICD-10-CM | POA: Insufficient documentation

## 2023-10-21 DIAGNOSIS — M25551 Pain in right hip: Secondary | ICD-10-CM | POA: Insufficient documentation

## 2023-10-21 DIAGNOSIS — M79651 Pain in right thigh: Secondary | ICD-10-CM | POA: Diagnosis not present

## 2023-10-21 DIAGNOSIS — G5711 Meralgia paresthetica, right lower limb: Secondary | ICD-10-CM | POA: Diagnosis not present

## 2023-10-21 DIAGNOSIS — Z96641 Presence of right artificial hip joint: Secondary | ICD-10-CM | POA: Diagnosis not present

## 2023-10-21 DIAGNOSIS — R52 Pain, unspecified: Secondary | ICD-10-CM | POA: Diagnosis not present

## 2023-10-21 DIAGNOSIS — R200 Anesthesia of skin: Secondary | ICD-10-CM | POA: Insufficient documentation

## 2023-10-21 DIAGNOSIS — M47816 Spondylosis without myelopathy or radiculopathy, lumbar region: Secondary | ICD-10-CM | POA: Diagnosis not present

## 2023-10-21 DIAGNOSIS — Z96643 Presence of artificial hip joint, bilateral: Secondary | ICD-10-CM | POA: Diagnosis not present

## 2023-10-21 DIAGNOSIS — M4316 Spondylolisthesis, lumbar region: Secondary | ICD-10-CM | POA: Diagnosis not present

## 2023-10-21 DIAGNOSIS — M5136 Other intervertebral disc degeneration, lumbar region with discogenic back pain only: Secondary | ICD-10-CM | POA: Diagnosis not present

## 2023-10-21 MED ORDER — CELECOXIB 200 MG PO CAPS: 200.0000 mg | ORAL_CAPSULE | Freq: Two times a day (BID) | ORAL | 0 refills | Status: DC

## 2023-10-21 MED ORDER — DEXAMETHASONE SODIUM PHOSPHATE 10 MG/ML IJ SOLN
10.0000 mg | Freq: Once | INTRAMUSCULAR | Status: AC
Start: 2023-10-21 — End: 2023-10-21
  Administered 2023-10-21: 10 mg via INTRAMUSCULAR
  Filled 2023-10-21: qty 1

## 2023-10-21 MED ORDER — KETOROLAC TROMETHAMINE 60 MG/2ML IM SOLN
30.0000 mg | Freq: Once | INTRAMUSCULAR | Status: AC
  Administered 2023-10-21: 30 mg via INTRAMUSCULAR
  Filled 2023-10-21: qty 2

## 2023-10-21 MED ORDER — METHOCARBAMOL 500 MG PO TABS: 500.0000 mg | ORAL_TABLET | Freq: Three times a day (TID) | ORAL | 0 refills | Status: AC | PRN

## 2023-10-21 MED ORDER — METHYLPREDNISOLONE 4 MG PO TBPK: ORAL_TABLET | ORAL | 0 refills | Status: DC

## 2023-10-21 NOTE — Progress Notes (Signed)
 Right lower extremity venous duplex has been completed. Preliminary results can be found in CV Proc through chart review.  Results were given to Tama Fails PA.  10/21/23 5:51 PM Birda Buffy RVT

## 2023-10-21 NOTE — Telephone Encounter (Signed)
 Stephanie [name] states that her right leg from knee to hip has been swollen since Friday. States it is painful and has been warm to touch. Continues to be swollen even when elevated. Instructed to go to the ED to be evaluated for a DVT. Has hx of DVT in right leg.

## 2023-10-21 NOTE — Discharge Instructions (Addendum)
 Lumbar radiculopathy is a condition where a nerve in the lower back becomes irritated or compressed, often causing pain, numbness, or weakness that radiates from the lower back into the leg. The most common cause is a herniated disc, but it can also result from spinal stenosis or degenerative changes.[2][8] Symptoms typically include:  Sharp, shooting pain down the leg (often called sciatica)  Numbness or tingling in the leg or foot  Muscle weakness in the affected leg  Sometimes, back pain Diagnosis is based on a combination of medical history, physical examination, and, if symptoms persist or there are concerning features (such as severe weakness or loss of bladder/bowel control), imaging studies like MRI may be considered.[2][5] Treatment is usually conservative at first:  Patient education: Most cases improve over weeks to months. Avoid bed rest; staying active is important.[1-2][7]  Pain management: Nonsteroidal anti-inflammatory drugs (NSAIDs) are first-line for pain relief. Opioids are not recommended except for severe, short-term pain.[2][4][7]  Physical therapy: Individualized exercise programs, including stretching, strengthening, and directional preference exercises (such as the McKenzie method), are recommended.[1][3][7]  Activity modification: Continue daily activities as tolerated, avoiding activities that worsen symptoms.[1-2]  Other modalities: Spinal manipulation, neural mobilization, and short-term use of traction may provide some benefit, but evidence is moderate.[3-4] Interventional options:  Epidural steroid injections may provide short-term pain relief in selected patients with severe or persistent symptoms. The American Academy of Neurology notes that these injections may improve pain for 2-6 weeks but do not improve long-term function or reduce the need for surgery.[5][6]  Surgery is considered if there is significant or progressive weakness, loss of bowel/bladder control, or  if pain and disability persist despite 6-12 weeks of conservative management. Surgery may provide faster relief of leg pain, but long-term outcomes are similar to continued conservative care.[2][4] Prognosis is generally favorable, with most patients experiencing significant improvement within weeks to months. Chronic symptoms may require a multidisciplinary approach, including physical therapy, vocational advice, and, in refractory cases, advanced pain management techniques.[1][4-5] Red flags (seek urgent medical attention):  Sudden, severe weakness in the leg  Loss of bladder or bowel control  Numbness in the groin or inner thighs [employer] of [employer] and the American Academy of Neurology recommend a stepwise, evidence-based approach, reserving imaging and invasive treatments for those who do not improve or who have concerning symptoms.[6-7]

## 2023-10-21 NOTE — ED Triage Notes (Signed)
 PT arrives via POV. PT reports pain, swelling, and some numbness to her right upper leg for about 1 week. PT denies injury, no chest pain, and no sob. Pt reports she has had a blood clot in the same leg in the past. Pt is not on any blood thinners. Pt is AxOx4.

## 2023-10-21 NOTE — ED Provider Notes (Signed)
 [place] EMERGENCY DEPARTMENT AT [facility] Provider Note   CSN: [id number] Arrival date & time: 10/21/23  1549     History  Chief Complaint  Patient presents with   Claudication    Stephanie [name] is a 63 y.o. female who presents emergency department with chief complaint of right hip and anterior thigh pain.  Symptoms began last week.  She is status post right total hip replacement in 2018.  Patient reports that about a week ago she began having deep aching pain in her groin and right thigh.  She states that it has progressively worsened.  It is constant and unrelieved by rest.  She states that it is worse when lying flat or standing for long periods of time.  She has a history of previous DVT and states that it feels similar.  She feels like it is swollen and numb.  She denies weakness in the leg.  She reports that the pain wraps from her hip around to the  and groin and down the right thigh to the knee but does not go past the knee.  She has been using a cane to ambulate.  She reports that she is not currently on any anticoagulation.  HPI     Home Medications Prior to Admission medications   Medication Sig Start Date End Date Taking? Authorizing Provider  albuterol  (PROVENTIL ) (2.5 MG/3ML) 0.083% nebulizer solution Take 3 mLs (2.5 mg total) by nebulization every 4 (four) hours as needed for wheezing or shortness of breath. 07/09/23   Kozlow, Rema Care, MD  albuterol  (VENTOLIN  HFA) 108 (90 Base) MCG/ACT inhaler Inhale 2 puffs into the lungs every 4 (four) hours as needed for wheezing or shortness of breath. 07/09/23   Kozlow, Rema Care, MD  amLODipine  (NORVASC ) 5 MG tablet Take 1 tablet (5 mg total) by mouth daily. 09/16/23   Bennet Brasil, MD  Ascorbic Acid (VITAMIN C) 1000 MG tablet Take 1,000 mg by mouth daily.    [provider]  budesonide -formoterol  (SYMBICORT ) 160-4.5 MCG/ACT inhaler Inhale 2 puffs into the lungs 2 (two) times daily. 07/09/23   Kozlow, Rema Care, MD   cetirizine  (ZYRTEC ) 10 MG tablet Take 1 tablet (10 mg total) by mouth daily as needed (Can take an extra dose during flare ups.). 07/09/23   Kozlow, Eric J, MD  Cholecalciferol (VITAMIN D3) 50 MCG (2000 UT) TABS Take by mouth daily at 6 (six) AM.    [provider]  EPINEPHrine  (EPIPEN  2-PAK) 0.3 mg/0.3 mL IJ SOAJ injection Inject 0.3 mg into the muscle once as needed for anaphylaxis (allergic reaction). 07/09/23   Kozlow, Rema Care, MD  famotidine  (PEPCID ) 40 MG tablet Take 1 tablet (40 mg total) by mouth at bedtime. 07/09/23   Kozlow, Rema Care, MD  Fluticasone  Furoate (ARNUITY ELLIPTA ) 100 MCG/ACT AEPB 1 inhalation daily 06/26/23   Bennet Brasil, MD  GARLIC PO Take 1 tablet by mouth daily.    [provider]  mometasone  (ELOCON ) 0.1 % ointment Apply topically daily. Apply to rash 07/09/23   Fabienne Holter, MD  Olopatadine-Mometasone  (RYALTRIS ) [phone] MCG/ACT SUSP Place 2 sprays into both nostrils 2 (two) times daily. 07/09/23   Kozlow, Rema Care, MD  omeprazole  (PRILOSEC) 40 MG capsule Take 1 capsule (40 mg total) by mouth in the morning. 07/09/23   Kozlow, Rema Care, MD  Spacer/Aero-Holding Idelle Majors DEVI 1 Device by Does not apply route as directed. 07/09/23   Kozlow, Rema Care, MD  TRUE METRIX BLOOD  GLUCOSE TEST test strip SMARTSIG:Via Meter 05/26/21   [provider]  TRUEplus Lancets 33G MISC  05/26/21   [provider]  vitamin E 180 MG (400 UNITS) capsule Take 400 Units by mouth daily.    [provider]  Zinc 50 MG CAPS Take 1 capsule by mouth daily at 6 (six) AM.    [provider]      Allergies    Amoxicillin , Aspirin, Other, Hydrocodone , Latex, Oxycodone , and Levaquin  [levofloxacin ]    Review of Systems   Review of Systems  Physical Exam Updated Vital Signs BP (!) 162/101 (BP Location: Left Arm)   Pulse 79   Temp 98 F (36.7 C) (Oral)   Resp 16   Ht 5' 4 (1.626 m)   Wt 93.3 kg   LMP 09/17/2016 (Approximate)   SpO2 98%   BMI 35.31 kg/m   Physical Exam Vitals and nursing note reviewed.  Constitutional:      General: She is not in acute distress.    Appearance: She is well-developed. She is not diaphoretic.  HENT:     Head: Normocephalic and atraumatic.     Right Ear: External ear normal.     Left Ear: External ear normal.     Nose: Nose normal.     Mouth/Throat:     Mouth: Mucous membranes are moist.  Eyes:     General: No scleral icterus.    Conjunctiva/sclera: Conjunctivae normal.  Cardiovascular:     Rate and Rhythm: Normal rate and regular rhythm.     Heart sounds: Normal heart sounds. No murmur heard.    No friction rub. No gallop.  Pulmonary:     Effort: Pulmonary effort is normal. No respiratory distress.     Breath sounds: Normal breath sounds.  Abdominal:     General: Bowel sounds are normal. There is no distension.     Palpations: Abdomen is soft. There is no mass.     Tenderness: There is no abdominal tenderness. There is no guarding.  Musculoskeletal:     Cervical back: Normal range of motion.  Skin:    General: Skin is warm and dry.  Neurological:     Mental Status: She is alert and oriented to person, place, and time.     Sensory: Sensory deficit present.     Motor: No weakness.     Comments: Antalgic gait  Normal 5/5 strength at the ankle, knee and hip on the R side Decreased sensation to light touch on the anterior right thigh and the right shin. Normal sensation on the lateral  and post calf, Medial thigh and posterior thigh on the Right  Psychiatric:        Behavior: Behavior normal.     ED Results / Procedures / Treatments   Labs (all labs ordered are listed, but only abnormal results are displayed) Labs Reviewed - No data to display  EKG None  Radiology No results found.  Procedures Procedures    Medications Ordered in ED Medications - No data to display  ED Course/ Medical Decision Making/ A&P                                 Medical Decision Making Amount and/or  Complexity of Data Reviewed Radiology: ordered.  Risk Prescription drug management.  Patient here with complaint of right leg pain numbness and subjective swelling.  Differential diagnosis includes DVT, radiculopathy, myositis less likely.  On physical exam there is no obvious swelling.  She has normal strength and abnormal sensation.  I ordered vascular ultrasound which was negative for DVT.  I visualized and interpreted right hip and lumbar spine x-ray.  The right hip x-ray is negative for acute fracture or dislocation or other abnormality and lumbar spine film shows multilevel degenerative changes and anterolisthesis.  Given patient's exam and findings I suspect lumbar radiculopathy and will treat the patient with steroid taper anti-inflammatories and a muscle relaxer.  She is given a shot of Decadron  and Toradol  here.  Patient will need outpatient follow-up if she has no red flag symptoms.  Discussed return precautions         Final Clinical Impression(s) / ED Diagnoses Final diagnoses:  None    Rx / DC Orders ED Discharge Orders     None         Tama Fails, PA-C 10/22/23 0010    Scarlette Currier, MD 10/25/23 2320

## 2023-10-23 ENCOUNTER — Ambulatory Visit: Payer: Self-pay

## 2023-10-23 NOTE — Telephone Encounter (Signed)
 Copied from CRM [id number]. Topic: Clinical - Red Word Triage >> Oct 23, 2023 11:50 AM Elle L wrote: Red Word that prompted transfer to Nurse Triage: The patient was calling regarding a hospital follow-up. However, she advised that she is still having severe hip pain from the slip disc in her back.   Reason for Disposition  [1] MODERATE pain (e.g., interferes with normal activities, limping) AND [2] present > 3 days    Seen in ED 6-9, Hospital F/U scheduled for 6/16  Answer Assessment - Initial Assessment Questions 1. LOCATION and RADIATION: Where is the pain located?      Right hip 2. QUALITY: What does the pain feel like?  (e.g., sharp, dull, aching, burning)     Dull 3. SEVERITY: How bad is the pain? What does it keep you from doing?   (Scale 1-10; or mild, moderate, severe)   -  MILD (1-3): doesn't interfere with normal activities    -  MODERATE (4-7): interferes with normal activities (e.g., work or school) or awakens from sleep, limping    -  SEVERE (8-10): excruciating pain, unable to do any normal activities, unable to walk     Moderate to severe  4. ONSET: When did the pain start? Does it come and go, or is it there all the time?     2 weeks ago 5. WORK OR EXERCISE: Has there been any recent work or exercise that involved this part of the body?      No 6. CAUSE: What do you think is causing the hip pain?      Slipped disc 8. OTHER SYMPTOMS: Do you have any other symptoms? (e.g., back pain, pain shooting down leg,  fever, rash)     Rash on right leg  Protocols used: Hip Pain-A-AH   FYI Only or Action Required?: FYI only for provider  Patient was last seen in primary care on 07/19/2023 by Bennet Brasil, MD. Called Nurse Triage reporting Hip Pain. Symptoms began 2 weeks ago. Interventions attempted: Prescription medications: Medrol  Dosepak and Robaxin  from the ED. Symptoms are: unchanged.  Triage Disposition: See PCP When Office is Open (Within 3  Days)  Patient/caregiver understands and will follow disposition?: Yes

## 2023-10-24 ENCOUNTER — Ambulatory Visit: Admitting: Physician Assistant

## 2023-10-24 ENCOUNTER — Encounter: Payer: Self-pay | Admitting: Physician Assistant

## 2023-10-24 VITALS — BP 125/85 | Ht 64.0 in | Wt 207.0 lb

## 2023-10-24 DIAGNOSIS — B029 Zoster without complications: Secondary | ICD-10-CM | POA: Diagnosis not present

## 2023-10-24 MED ORDER — VALACYCLOVIR HCL 1 G PO TABS: 1000.0000 mg | ORAL_TABLET | Freq: Three times a day (TID) | ORAL | 0 refills | Status: DC

## 2023-10-24 NOTE — Progress Notes (Signed)
   Acute Office Visit  Subjective:     Patient ID: Stephanie [name], female    DOB: 1961/01/22, 63 y.o.   MRN: [id number]   Patient presents today with concerns for a rash on her right upper leg. She reports rash began yesterday. Rash is present primarily on her right inner thigh with some areas on her hip area. She denies rash on her left leg or other areas of her body. She denies associated pain or itching. She denies fever.   Patient was seen in the ER on Monday for concerns of back and hip pain. She was given medrol  dose pack and muscle relaxer at that time. She has follow up with Dr. Geralyn Knee on Monday regarding back and hip pain.      Review of Systems  Constitutional:  Negative for fever, malaise/fatigue and weight loss.  Musculoskeletal:  Positive for back pain and joint pain.  Skin:  Positive for rash. Negative for itching.        Objective:     BP 125/85   Ht 5' 4 (1.626 m)   Wt 207 lb (93.9 kg)   LMP 09/17/2016 (Approximate)   BMI 35.53 kg/m   Physical Exam Constitutional:      Appearance: Normal appearance. She is obese.  HENT:     Head: Normocephalic and atraumatic.     Mouth/Throat:     Mouth: Mucous membranes are moist.     Pharynx: Oropharynx is clear.   Cardiovascular:     Rate and Rhythm: Normal rate and regular rhythm.     Heart sounds: Normal heart sounds.  Pulmonary:     Effort: Pulmonary effort is normal.     Breath sounds: Normal breath sounds.   Skin:    General: Skin is warm and dry.     Findings: Rash present. Rash is vesicular.     Comments: Vesicular rash present on right inner thigh. No signs of acute infection.    Neurological:     General: No focal deficit present.     Mental Status: She is alert.   Psychiatric:        Mood and Affect: Mood normal.     No results found for any visits on 10/24/23.      Assessment & Plan:  Herpes zoster without complication Assessment & Plan: Patient presents today with complaints of rash  on her right thigh x1 days. Vesicular rash present on right thing in the L3/L4 dermatomal distribution. Rash consistent with herpes zoster, although patient is not experiencing pain. Will treat with valacyclovir and advised patient to start previously prescribed medrol  dose pack. Warning signs and return precautions discussed. Patient to follow up with Dr. Geralyn Knee early next week for previously scheduled hospital follow up.   Orders: -     valACYclovir HCl; Take 1 tablet (1,000 mg total) by mouth 3 (three) times daily for 7 days.  Dispense: 21 tablet; Refill: 0    Return in about 4 days (around 10/28/2023) for previously scheduled appt .  Jearlean Mince Jowanna Loeffler, PA-C

## 2023-10-24 NOTE — Assessment & Plan Note (Signed)
 Patient presents today with complaints of rash on her right thigh x1 days. Vesicular rash present on right thing in the L3/L4 dermatomal distribution. Rash consistent with herpes zoster, although patient is not experiencing pain. Will treat with valacyclovir and advised patient to start previously prescribed medrol  dose pack. Warning signs and return precautions discussed. Patient to follow up with Dr. Geralyn Knee early next week for previously scheduled hospital follow up.

## 2023-10-28 ENCOUNTER — Ambulatory Visit: Admitting: Family Medicine

## 2023-10-28 ENCOUNTER — Encounter: Payer: Self-pay | Admitting: Family Medicine

## 2023-10-28 VITALS — BP 122/75 | HR 80 | Temp 98.2°F | Ht 64.0 in | Wt 207.0 lb

## 2023-10-28 DIAGNOSIS — M5441 Lumbago with sciatica, right side: Secondary | ICD-10-CM | POA: Diagnosis not present

## 2023-10-28 MED ORDER — PREGABALIN 25 MG PO CAPS: 25.0000 mg | ORAL_CAPSULE | Freq: Two times a day (BID) | ORAL | 6 refills | Status: DC

## 2023-10-28 NOTE — Progress Notes (Signed)
   Subjective:    Patient ID: Stephanie [name], female    DOB: Jan 23, 1961, 63 y.o.   MRN: [id number]  HPI ED f/u right hip pain  Not much improvement in pain  Discussed the use of AI scribe software for clinical note transcription with the patient, who gave verbal consent to proceed.  History of Present Illness   Stephanie [name] is a 63 year old female who presents with right groin and lower back pain radiating to the leg.  She experiences constant pain in the right groin and lower back, similar to previous hip issues. The pain radiates down her leg, sometimes stopping at the knee and other times extending to the calf, causing daily numbness with a 'pins and needles' sensation.  The pain is severe enough to disrupt her rest and makes walking difficult, occasionally necessitating the use of a walker. She has been taking a muscle relaxer to manage the symptoms, but finds that laying down and oversleeping can sometimes alleviate the discomfort.  In the emergency room, she underwent x-rays of her hip and lower back, as well as an ultrasound of her leg. The ultrasound did not reveal any blood clots. She recalls that x-rays were performed of her lower back, but is not certain of the results.  She is planning a trip to [place] with her family and is concerned about managing her symptoms during the trip.       Review of Systems     Objective:   Physical Exam  Subjective discomfort down the right leg strength in both legs good strength in arms good Lungs clear heart regular pulse normal Patient able to walk but some difficulty with the pain      Assessment & Plan:  Sciatica-recommend pregabalin  Recommend follow-up if not improving over the next 4 to 6 weeks May need MRI if ongoing troubles  Discussed the use of AI scribe software for clinical note transcription with the patient, who gave verbal consent to proceed.    Assessment and Plan    Degenerative Disc Disease with  Spondylosis Persistent right groin and lower back pain with leg radiation, numbness, and paresthesia due to lumbar spine degenerative changes and spondylosis with anterolisthesis causing nerve compression. Pregabalin  proposed to modulate nerve signals and manage symptoms. - Prescribe pregabalin , start low dose in evening for 3-4 days, then increase to twice daily. - Request feedback via MyChart in two weeks on medication effectiveness. - Provide printed exercises and stretches. - Advise pacing, hydration, and rest during [employer] trip. - Recommend gentle exercise and stretching for mobility. - Plan MRI of lumbar spine in September if no improvement.  Follow-up Follow-up scheduled to reassess condition and treatment effectiveness. - Keep follow-up appointment on November 07, 2023. - Advise lab work completion before follow-up if possible.

## 2023-11-07 ENCOUNTER — Ambulatory Visit: Admitting: Family Medicine

## 2023-11-11 ENCOUNTER — Ambulatory Visit: Admitting: Family Medicine

## 2023-11-11 ENCOUNTER — Encounter: Payer: Self-pay | Admitting: Family Medicine

## 2023-11-11 VITALS — BP 128/88 | HR 77 | Temp 97.2°F | Ht 64.0 in | Wt 205.0 lb

## 2023-11-11 DIAGNOSIS — M5441 Lumbago with sciatica, right side: Secondary | ICD-10-CM

## 2023-11-11 NOTE — Progress Notes (Addendum)
   Subjective:    Patient ID: Stephanie [name], female    DOB: 10/07/1960, 63 y.o.   MRN: [id number]  HPI Follow up low back right hip pain - tingling, radiating pain down lwg Stopped lyrica  due to extreme drowzyness , took medrol  dosepack Pain better than it was 7/10 currently but still present  Ongoing back pain with sciatica although much better than what it has been is still present states Lyrica  caused a lot of symptoms and states does not want to be on medications if possible. Discussed the use of AI scribe software for clinical note transcription with the patient, who gave verbal consent to proceed.  History of Present Illness   Stephanie [name] is a 63 year old female who presents with hip pain and a persistent skin lesion.  Hip pain and impaired mobility - Significant hip pain impacting mobility - Requires use of a scooter during travel - Pain is intermittent, with intensity reaching 8/10 when standing - Pain described as coming and going - Manages pain with as-needed Tylenol , turmeric, and warm water  - Performs stretching exercises for discomfort - Prefers physical therapy over medication for pain management - Previously trialed Lyrica  for pain, discontinued due to excessive drowsiness at low doses - Wears supportive shoes, which provide benefit for back and hip discomfort  Pruritic scaly skin lesion - Persistent skin lesion on the right side of the back, present for over two weeks - Lesion is itchy and scaly - Initially appeared as a large bump, believed to be caused by a bite - Currently drying up, not draining - Remains pruritic and cosmetically bothersome      Assessment and Plan    Hip Pain Chronic hip pain, prefers non-pharmacological management. Lyrica  discontinued due to drowsiness. - Recommend physical therapy. - Advise acetaminophen  for severe pain as needed. - Encourage turmeric and warm water  for relief.  Hypertension Blood pressure slightly elevated at  130/94 mmHg. Improvement expected with physical therapy. - Monitor blood pressure. - Reassess in one month.  Skin Lesion Itchy, scaly lesion likely from insect bite, drying up, no infection signs.  General Health Maintenance Engaging in lifestyle modifications, including supportive shoes and turmeric.  Follow-up Reassess conditions and response to physical therapy. - Schedule follow-up in 4-6 weeks. - Initiate physical therapy referral.       Review of Systems     Objective:   Physical Exam  Good strength both legs negative weakness but positive straight leg raise lungs clear heart regular      Assessment & Plan:  Sciatica Referral to physical therapy Follow-up with myself or Elveria in the next 4 to 6 weeks Hold off on MRI If worsening issues notify us

## 2023-11-14 ENCOUNTER — Other Ambulatory Visit: Payer: Self-pay

## 2023-11-14 DIAGNOSIS — M5441 Lumbago with sciatica, right side: Secondary | ICD-10-CM

## 2023-11-26 ENCOUNTER — Other Ambulatory Visit: Payer: Self-pay | Admitting: Physician Assistant

## 2023-11-26 DIAGNOSIS — B029 Zoster without complications: Secondary | ICD-10-CM

## 2023-12-10 ENCOUNTER — Encounter: Payer: Self-pay | Admitting: Allergy and Immunology

## 2023-12-10 ENCOUNTER — Ambulatory Visit (INDEPENDENT_AMBULATORY_CARE_PROVIDER_SITE_OTHER): Admitting: Allergy and Immunology

## 2023-12-10 ENCOUNTER — Other Ambulatory Visit: Payer: Self-pay

## 2023-12-10 VITALS — BP 140/82 | HR 84 | Temp 98.0°F | Wt 201.2 lb

## 2023-12-10 DIAGNOSIS — J3089 Other allergic rhinitis: Secondary | ICD-10-CM

## 2023-12-10 DIAGNOSIS — J4540 Moderate persistent asthma, uncomplicated: Secondary | ICD-10-CM

## 2023-12-10 DIAGNOSIS — T39395D Adverse effect of other nonsteroidal anti-inflammatory drugs [NSAID], subsequent encounter: Secondary | ICD-10-CM | POA: Diagnosis not present

## 2023-12-10 DIAGNOSIS — K219 Gastro-esophageal reflux disease without esophagitis: Secondary | ICD-10-CM

## 2023-12-10 DIAGNOSIS — T886XXA Anaphylactic reaction due to adverse effect of correct drug or medicament properly administered, initial encounter: Secondary | ICD-10-CM

## 2023-12-10 DIAGNOSIS — T886XXD Anaphylactic reaction due to adverse effect of correct drug or medicament properly administered, subsequent encounter: Secondary | ICD-10-CM

## 2023-12-10 MED ORDER — ALBUTEROL SULFATE HFA 108 (90 BASE) MCG/ACT IN AERS: 2.0000 | INHALATION_SPRAY | RESPIRATORY_TRACT | 1 refills | Status: AC | PRN

## 2023-12-10 MED ORDER — OMEPRAZOLE 40 MG PO CPDR: 40.0000 mg | DELAYED_RELEASE_CAPSULE | Freq: Every morning | ORAL | 1 refills | Status: DC

## 2023-12-10 MED ORDER — EPINEPHRINE 0.3 MG/0.3ML IJ SOAJ: 0.3000 mg | Freq: Once | INTRAMUSCULAR | 1 refills | Status: AC | PRN

## 2023-12-10 MED ORDER — ALBUTEROL SULFATE (2.5 MG/3ML) 0.083% IN NEBU: 2.5000 mg | INHALATION_SOLUTION | RESPIRATORY_TRACT | 1 refills | Status: AC | PRN

## 2023-12-10 MED ORDER — NEBULIZER MASK ADULT MISC
1.0000 | 1 refills | Status: AC
Start: 2023-12-10 — End: ?

## 2023-12-10 MED ORDER — BUDESONIDE-FORMOTEROL FUMARATE 160-4.5 MCG/ACT IN AERO: 2.0000 | INHALATION_SPRAY | Freq: Two times a day (BID) | RESPIRATORY_TRACT | 1 refills | Status: AC

## 2023-12-10 MED ORDER — SPACER/AERO-HOLDING CHAMBERS DEVI: 1.0000 | 1 refills | Status: AC

## 2023-12-10 MED ORDER — FAMOTIDINE 40 MG PO TABS: 40.0000 mg | ORAL_TABLET | Freq: Every evening | ORAL | 1 refills | Status: AC

## 2023-12-10 NOTE — Patient Instructions (Addendum)
  1. Treat and prevent inflammation of airway:   A. Symbicort  160 - 2 inhalations 1-2 times per day w/ spacer (empty lungs)  B. Flonase  - 2 sprays each nostril 1 time per day  2. Treat and prevent reflux:   A. Omeprazole  40 mg - 1 tablet in AM  B. Famotidine  40 mg - 1 tablet in PM  3. If needed:   A. Albuterol  HFA - 2 inhalations every 4-6 hours  B. Albuterol  nebulization - every 4-6 hours  C. Cetirizine  10 mg - 1 tablet 1 time per day  D. Epi-Pen, benadryl , MD/ER evaluation for allergic reaction  4. Return to clinic in 6 months or earlier if problem  5. Influenza = Tamiflu . Covid = Paxlovid

## 2023-12-10 NOTE — Progress Notes (Unsigned)
 [place] [facility] [place] [facility] [place] [facility] [name] [facility] [place]   Follow-up Note  Referring Provider: Alphonsa Glendia LABOR, MD Primary Provider: Alphonsa Glendia LABOR, MD Date of Office Visit: 12/10/2023  Subjective:   Stephanie [name] (DOB: 06-01-1960) is a 63 y.o. female who [facility] in re-evaluation of the following:  HPI: [name] returns to this clinic in evaluation of asthma, allergic rhinitis, reflux, and history of nonsteroidal anti-inflammatory drug induced anaphylaxis.  I last saw her in this clinic 06 August 2023.  She has really done very well with her airway and it does not sound as though she required a systemic steroid or an antibiotic for any type of airway issue while she continues to use Symbicort  mostly 1 time per day and nasal fluticasone  on a consistent basis.  Rarely does she use the short acting bronchodilator.  She does not really exercise secondary to multiple musculoskeletal issues.  And her reflux has been under excellent control on her current plan of using a proton pump inhibitor and H2 receptor blocker.  She apparently developed right lower extremity shingles that was treated with a systemic steroid and Valtrex  this June.  She does not use nonsteroidal anti-inflammatory drugs.  Allergies as of 12/10/2023       Reactions   Amoxicillin  Rash   fash swelled up and itching 04/2018   Aspirin Anaphylaxis   Other Anaphylaxis   Nsaids   Hydrocodone  Itching      Latex Hives, Itching   Oxycodone     itching   Levaquin  [levofloxacin ]    Rash on chest no hives 04/2018   Pregabalin     Fatigue and sleepiness        Medication List    albuterol  (2.5 MG/3ML) 0.083% nebulizer solution Commonly known as: PROVENTIL  Take 3 mLs (2.5 mg total) by nebulization every 4 (four) hours as needed for wheezing or shortness of breath.   albuterol  108 (90 Base) MCG/ACT inhaler Commonly known as: VENTOLIN  HFA Inhale 2 puffs into the lungs  every 4 (four) hours as needed for wheezing or shortness of breath.   amLODipine  5 MG tablet Commonly known as: NORVASC  Take 1 tablet (5 mg total) by mouth daily.   Arnuity Ellipta  100 MCG/ACT Aepb Generic drug: Fluticasone  Furoate 1 inhalation daily   budesonide -formoterol  160-4.5 MCG/ACT inhaler Commonly known as: Symbicort  Inhale 2 puffs into the lungs 2 (two) times daily.   cetirizine  10 MG tablet Commonly known as: ZYRTEC  Take 1 tablet (10 mg total) by mouth daily as needed (Can take an extra dose during flare ups.).   EPINEPHrine  0.3 mg/0.3 mL Soaj injection Commonly known as: EpiPen  2-Pak Inject 0.3 mg into the muscle once as needed for anaphylaxis (allergic reaction).   famotidine  40 MG tablet Commonly known as: PEPCID  Take 1 tablet (40 mg total) by mouth at bedtime.   GARLIC PO Take 1 tablet by mouth daily.   methocarbamol  500 MG tablet Commonly known as: ROBAXIN  Take 1 tablet (500 mg total) by mouth 3 (three) times daily as needed for muscle spasms.   methylPREDNISolone  4 MG Tbpk tablet Commonly known as: MEDROL  DOSEPAK Use as directed   mometasone  0.1 % ointment Commonly known as: ELOCON  Apply topically daily. Apply to rash   omeprazole  40 MG capsule Commonly known as: PRILOSEC Take 1 capsule (40 mg total) by mouth in the morning.   Ryaltris  665-25 MCG/ACT Susp Generic drug: Olopatadine-Mometasone  Place 2 sprays into both nostrils 2 (two) times daily.  Spacer/Aero-Holding [employer] 1 Device by Does not apply route as directed.   True Metrix Blood Glucose Test test strip Generic drug: glucose blood SMARTSIG:Via Meter   TRUEplus Lancets 33G Misc   valACYclovir  1000 MG tablet Commonly known as: VALTREX  TAKE 1 TABLET BY MOUTH THREE TIMES DAILY FOR 7 DAYS   vitamin C 1000 MG tablet Take 1,000 mg by mouth daily.   Vitamin D3 50 MCG (2000 UT) Tabs Take by mouth daily at 6 (six) AM.   vitamin E 180 MG (400 UNITS) capsule Take 400 Units by  mouth daily.   Zinc 50 MG Caps Take 1 capsule by mouth daily at 6 (six) AM.    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   HENT: Negative.    Eyes: Negative.   Respiratory: Negative.    Cardiovascular: Negative.   Gastrointestinal: Negative.   Genitourinary: Negative.   Musculoskeletal: Negative.   Skin: Negative.   Neurological: Negative.   Endo/Heme/Allergies: Negative.   Psychiatric/Behavioral: Negative.       Objective:   Vitals:   12/10/23 1644  BP: (!) 140/82  Pulse: 84  Temp: 98 F (36.7 C)  SpO2: 97%      Weight: 201 lb 3.2 oz (91.3 kg)   Physical Exam Constitutional:      Appearance: She is not diaphoretic.  HENT:     Head: Normocephalic.     Right Ear: Tympanic membrane, ear canal and external ear normal.     Left Ear: Tympanic membrane, ear canal and external ear normal.     Nose: Nose normal. No mucosal edema or rhinorrhea.     Mouth/Throat:     Pharynx: Uvula midline. No oropharyngeal exudate.  Eyes:     Conjunctiva/sclera: Conjunctivae normal.  Neck:     Thyroid: No  thyromegaly.     Trachea: Trachea normal. No tracheal tenderness or tracheal deviation.  Cardiovascular:     Rate and Rhythm: Normal rate and regular rhythm.     Heart sounds: Normal heart sounds, S1 normal and S2 normal. No murmur heard. Pulmonary:     Effort: No respiratory distress.     Breath sounds: Normal breath sounds. No stridor. No wheezing or rales.  Lymphadenopathy:     Head:     Right side of head: No tonsillar adenopathy.     Left side of head: No tonsillar adenopathy.     Cervical: No cervical adenopathy.  Skin:    Findings: No erythema or  rash.     Nails: There is no clubbing.  Neurological:     Mental Status: She is alert.     Diagnostics: Spirometry was performed and demonstrated an FEV1 of 1.45 at 74 % of predicted.  Assessment and Plan:   1. Asthma, moderate persistent, well-controlled   2. Perennial allergic rhinitis   3. Gastroesophageal reflux disease, unspecified whether esophagitis present   4. 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 Treat and prevent inflammation of airway:   A. Symbicort  160 - 2 inhalations 1-2 times per day w/ spacer (empty lungs)  B. Flonase  - 2 sprays each nostril 1 time per day  2. Treat and prevent reflux:   A. Omeprazole  40 mg - 1 tablet in AM  B. Famotidine  40 mg - 1 tablet in PM  3. If needed:   A. Albuterol  HFA - 2 inhalations every 4-6 hours  B. Albuterol  nebulization - every 4-6 hours  C. Cetirizine  10 mg - 1 tablet 1 time per day  D. Epi-Pen, benadryl , MD/ER evaluation for allergic reaction  4. Return to clinic in 6 months or earlier if problem  5. Influenza = Tamiflu . Covid = Paxlovid  Geralda is really doing very well on her current plan of using anti-inflammatory agents for both her upper and lower airway and therapy directed against reflux.  Assuming she continues to do well on this plan I will see her back in this clinic in 6 months or earlier if there is a problem.  Camellia Denis, MD Allergy  / Immunology [facility]

## 2023-12-11 ENCOUNTER — Encounter: Payer: Self-pay | Admitting: Allergy and Immunology

## 2023-12-13 ENCOUNTER — Encounter: Payer: Self-pay | Admitting: Nurse Practitioner

## 2023-12-13 ENCOUNTER — Ambulatory Visit: Admitting: Nurse Practitioner

## 2023-12-13 VITALS — BP 138/80 | HR 98 | Temp 97.9°F | Ht 64.0 in | Wt 201.0 lb

## 2023-12-13 DIAGNOSIS — B0229 Other postherpetic nervous system involvement: Secondary | ICD-10-CM

## 2023-12-13 DIAGNOSIS — M5441 Lumbago with sciatica, right side: Secondary | ICD-10-CM

## 2023-12-13 NOTE — Patient Instructions (Addendum)
 Continue back exercises Follow up with PT as planned.  Try Capsaicin cream or Lidocaine  patches

## 2023-12-13 NOTE — Progress Notes (Signed)
 Subjective:    Patient ID: Stephanie [name], female    DOB: May 24, 1960, 63 y.o.   MRN: [id number]  HPI Presents for a recheck on her back pain, see previous note. States her pain is much improved down to a 2/10 from last visit. Has an appointment later in August for PT. That was the first available appointment. Has been doing exercises given at last visit with heat applications. Began having pain in the right low back on 6/10 and on 6/12 broke out with shingles along upper and mid thigh right side. Rash is gone. Still having some slight itching. Back pain starts in the lumbar area and radiates into the anterior right thigh along the same area where she had shingles. Will occasionally radiate into the lower leg into the foot. Still struggling with movement but much better. Has also been applying red oil recommended by her sister (may be a type of topical pepper). Stopped Gabapentin  and Pregabalin  due to side effects.    Review of Systems  Respiratory:  Negative for cough, chest tightness and shortness of breath.   Cardiovascular:  Negative for chest pain.  Musculoskeletal:  Positive for back pain.  Neurological:  Positive for weakness.       Slight weakness in the right leg at times. Has issues since her blood clot in this leg.        Objective:   Physical Exam Vitals reviewed.  Constitutional:      General: She is not in acute distress. Cardiovascular:     Rate and Rhythm: Normal rate and regular rhythm.  Pulmonary:     Effort: Pulmonary effort is normal.     Breath sounds: Normal breath sounds.  Musculoskeletal:     Right lower leg: No edema.     Left lower leg: No edema.     Comments: SLR negative bilaterally. Tenderness noted with movement from supine to sitting position.   Neurological:     Mental Status: She is alert.     Motor: No weakness.     Gait: Gait abnormal.     Deep Tendon Reflexes: Reflexes normal.     Comments: Gait slow but steady. Gets on and off exam table  with minimal assistance. States this is much improved. Could not do this before. Even had a hard time getting in and out bed without assitance.   Psychiatric:        Mood and Affect: Mood normal.        Behavior: Behavior normal.        Thought Content: Thought content normal.        Judgment: Judgment normal.    Today's Vitals   12/13/23 1040  BP: 138/80  Pulse: 98  Temp: 97.9 F (36.6 C)  SpO2: 99%  Weight: 201 lb (91.2 kg)  Height: 5' 4 (1.626 m)   Body mass index is 34.5 kg/m.         Assessment & Plan:   Problem List Items Addressed This Visit       Nervous and Auditory   Right-sided low back pain with right-sided sciatica - Primary   Other Visit Diagnoses       Post herpetic neuralgia         Hold on MRI for now.  Continue back exercises. Follow up with PT as planned.  Try Capsaicin cream or Lidocaine  patches. Expect slow continued resolution of neuralgia from shingles. Recommend Shingrix  once her symptoms have resolved.  Return if symptoms worsen or  fail to improve.

## 2023-12-15 NOTE — Progress Notes (Unsigned)
 Cardiology Office Note:  .   Date:  12/16/2023  ID:  Stephanie [name], DOB 1960-12-10, MRN [id number] PCP: Alphonsa Glendia LABOR, MD  [facility] Providers Cardiologist:  None    History of Present Illness: .    Chief Complaint  Patient presents with   Chest Pain    Stephanie [name] is a 63 y.o. female with history of HTN, HLD who presents for the evaluation of chest pain at the request of Luking, Glendia LABOR, MD.   History of Present Illness   Stephanie [name] is a 63 year old female with hypertension and hyperlipidemia who presents with chest pain. She was referred by Dr. Arminda Novak for evaluation of chest pain.  She has been experiencing chest discomfort since March, initially coinciding with a bout of the flu. During that time, she felt significant pressure in her chest and palpitations. Although the symptoms have improved since recovering from the flu, she still occasionally experiences mild chest pressure. These episodes occur about once a month, lasting only seconds. The discomfort does not typically radiate to her arm or jaw, except for one instance when it traveled down her arm, prompting an ER visit.  Her medical history includes hypertension and hyperlipidemia. She also has asthma. Her surgical history includes two hip replacements and a C-section.  Her family history is significant for heart disease, with her father and many paternal relatives having died from heart-related issues.  In the review of symptoms, she denies trouble breathing and reports occasional swelling in the right leg, which requires a stocking.          Problem List HTN Asthma T chol 170, HDL 74, LDL 74, TG 126    ROS: All other ROS reviewed and negative. Pertinent positives noted in the HPI.     Studies Reviewed: SABRA   EKG Interpretation Date/Time:  Monday December 16 2023 14:35:15 EDT Ventricular Rate:  81 PR Interval:  166 QRS Duration:  84 QT Interval:  362 QTC Calculation: 420 R  Axis:   -36  Text Interpretation: Normal sinus rhythm Left axis deviation Minimal voltage criteria for LVH, may be normal variant ( R in aVL ) Septal infarct , age undetermined Confirmed by Barbaraann Kotyk [phone]) on 12/16/2023 2:39:28 PM   Physical Exam:   VS:  BP 124/86   Pulse 92   Ht 5' 4 (1.626 m)   Wt 202 lb 12.8 oz (92 kg)   LMP 09/17/2016 (Approximate)   SpO2 95%   BMI 34.81 kg/m    Wt Readings from Last 3 Encounters:  12/16/23 202 lb 12.8 oz (92 kg)  12/13/23 201 lb (91.2 kg)  12/10/23 201 lb 3.2 oz (91.3 kg)    GEN: Well nourished, well developed in no acute distress NECK: No JVD; No carotid bruits CARDIAC: RRR, no murmurs, rubs, gallops RESPIRATORY:  Clear to auscultation without rales, wheezing or rhonchi  ABDOMEN: Soft, non-tender, non-distended EXTREMITIES:  No edema; No deformity  ASSESSMENT AND PLAN: .   Assessment and Plan    Chest pain Intermittent chest pain with LVH on EKG. Further evaluation needed to rule out coronary artery disease. - Order coronary CT angiography to evaluate for coronary artery disease. - Perform basic metabolic panel to assess kidney function prior to CT scan with contrast. - Prescribe metoprolol  100 mg before CT scan to slow heart rate.  Hypertension Hypertension is well controlled.  Hyperlipidemia Hyperlipidemia noted.  Follow-up: Return if symptoms worsen or fail to improve.   Signed, Darryle DASEN. Barbaraann, MD, [facility]  [facility]  [address] [place], [place] [address] [phone]  6:50 PM  7

## 2023-12-16 ENCOUNTER — Encounter: Payer: Self-pay | Admitting: Cardiovascular Disease

## 2023-12-16 ENCOUNTER — Ambulatory Visit: Attending: Cardiovascular Disease | Admitting: Cardiovascular Disease

## 2023-12-16 VITALS — BP 124/86 | HR 92 | Ht 64.0 in | Wt 202.8 lb

## 2023-12-16 DIAGNOSIS — I150 Renovascular hypertension: Secondary | ICD-10-CM

## 2023-12-16 DIAGNOSIS — R072 Precordial pain: Secondary | ICD-10-CM

## 2023-12-16 MED ORDER — METOPROLOL SUCCINATE ER 100 MG PO TB24: ORAL_TABLET | ORAL | 0 refills | Status: AC

## 2023-12-16 NOTE — Patient Instructions (Signed)
 Medication Instructions:   NO CHANGE  *If you need a refill on your cardiac medications before your next appointment, please call your pharmacy*  Lab Work:  Your physician recommends that you HAVE LAB WORK TODAY  If you have labs (blood work) drawn today and your tests are completely normal, you will receive your results only by: MyChart Message (if you have MyChart) OR A paper copy in the mail If you have any lab test that is abnormal or we need to change your treatment, we will call you to review the results.  Testing/Procedures:    Your cardiac CT will be scheduled at   [facility] [address]  [place], [place] [address]   If scheduled at the [facility] at [address], please enter the parking lot using the [address] entrance and use the FREE valet service at the patient drop-off area. Enter the building and check-in with registration on the main floor.    Please follow these instructions carefully (unless otherwise directed):  An IV will be required for this test and Nitroglycerin will be given.    On the Night Before the Test: Be sure to Drink plenty of water . Do not consume any caffeinated/decaffeinated beverages or chocolate 12 hours prior to your test. Do not take any antihistamines 12 hours prior to your test.  On the Day of the Test: Drink plenty of water  until 1 hour prior to the test. Do not eat any food 1 hour prior to test. You may take your regular medications prior to the test.  Take metoprolol  (Lopressor ) 100 MG two hours prior to test. FEMALES- please wear underwire-free bra if available, avoid dresses & tight clothing      After the Test: Drink plenty of water . After receiving IV contrast, you may experience a mild flushed feeling. This is normal. On occasion, you may experience a mild rash up to 24 hours after the test. This is not dangerous. If this occurs, you can take Benadryl  25 mg, Zyrtec ,  Claritin, or Allegra and increase your fluid intake. (Patients taking Tikosyn should avoid Benadryl , and may take Zyrtec , Claritin, or Allegra) If you experience trouble breathing, this can be serious. If it is severe call 911 IMMEDIATELY. If it is mild, please call our office.  We will call to schedule your test 2-4 weeks out understanding that some insurance companies will need an authorization prior to the service being performed.   For more information and frequently asked questions, please visit our website : [email or web address]  For non-scheduling related questions, please contact the cardiac imaging nurse navigator should you have any questions/concerns: Cardiac Imaging Nurse Navigators Direct Office Dial: [phone]   For scheduling needs, including cancellations and rescheduling, please call [place], [phone].   Follow-Up: At [facility], you and your health needs are our priority.  As part of our continuing mission to provide you with exceptional heart care, our providers are all part of one team.  This team includes your primary Cardiologist (physician) and Advanced Practice Providers or APPs (Physician Assistants and Nurse Practitioners) who all work together to provide you with the care you need, when you need it.  Your next appointment:   AS NEEDED BASED ON TESTING

## 2023-12-17 ENCOUNTER — Ambulatory Visit: Payer: Self-pay | Admitting: Cardiovascular Disease

## 2023-12-17 DIAGNOSIS — R931 Abnormal findings on diagnostic imaging of heart and coronary circulation: Secondary | ICD-10-CM

## 2023-12-17 LAB — BASIC METABOLIC PANEL WITH GFR
BUN/Creatinine Ratio: 12 (ref 12–28)
BUN: 9 mg/dL (ref 8–27)
CO2: 25 mmol/L (ref 20–29)
Calcium: 9.9 mg/dL (ref 8.7–10.3)
Chloride: 102 mmol/L (ref 96–106)
Creatinine, Ser: 0.77 mg/dL (ref 0.57–1.00)
Glucose: 89 mg/dL (ref 70–99)
Potassium: 4.1 mmol/L (ref 3.5–5.2)
Sodium: 141 mmol/L (ref 134–144)
eGFR: 87 mL/min/1.73 (ref >59)

## 2023-12-20 ENCOUNTER — Ambulatory Visit: Admitting: Family Medicine

## 2023-12-24 ENCOUNTER — Other Ambulatory Visit (HOSPITAL_COMMUNITY)

## 2023-12-25 ENCOUNTER — Telehealth (HOSPITAL_COMMUNITY): Payer: Self-pay | Admitting: *Deleted

## 2023-12-25 ENCOUNTER — Other Ambulatory Visit (HOSPITAL_COMMUNITY): Payer: Self-pay | Admitting: *Deleted

## 2023-12-25 MED ORDER — METOPROLOL TARTRATE 100 MG PO TABS: ORAL_TABLET | ORAL | 0 refills | Status: AC

## 2023-12-25 NOTE — Telephone Encounter (Signed)
 Reaching out to patient to offer assistance regarding upcoming cardiac imaging study; pt verbalizes understanding of appt date/time, parking situation and where to check in, pre-test NPO status and medications ordered, and verified current allergies; name and call back number provided for further questions should they arise  Chantal Requena RN Navigator Cardiac Imaging [facility] [phone] office [phone] cell  Per Dr. Barbaraann, patient needs to take 100mg  metoprolol  tartrate for her test instead of 100mg  metoprolol  succinate. Patient is aware of change in medication.

## 2023-12-26 ENCOUNTER — Ambulatory Visit (HOSPITAL_COMMUNITY)
Admission: RE | Admit: 2023-12-26 | Discharge: 2023-12-26 | Disposition: A | Discharge status: Home / Self Care | Source: Ambulatory Visit | Attending: Cardiovascular Disease | Admitting: Cardiovascular Disease

## 2023-12-26 DIAGNOSIS — R072 Precordial pain: Secondary | ICD-10-CM | POA: Diagnosis not present

## 2023-12-26 DIAGNOSIS — Q245 Malformation of coronary vessels: Secondary | ICD-10-CM | POA: Insufficient documentation

## 2023-12-26 DIAGNOSIS — I2510 Atherosclerotic heart disease of native coronary artery without angina pectoris: Secondary | ICD-10-CM | POA: Diagnosis not present

## 2023-12-26 MED ORDER — NITROGLYCERIN 0.4 MG SL SUBL
0.8000 mg | SUBLINGUAL_TABLET | Freq: Once | SUBLINGUAL | Status: AC
Start: 2023-12-26 — End: 2023-12-26
  Administered 2023-12-26: 0.8 mg via SUBLINGUAL

## 2023-12-26 MED ORDER — IOHEXOL 350 MG/ML SOLN
100.0000 mL | Freq: Once | INTRAVENOUS | Status: AC | PRN
  Administered 2023-12-26: 100 mL via INTRAVENOUS

## 2023-12-27 MED ORDER — ROSUVASTATIN CALCIUM 5 MG PO TABS: 5.0000 mg | ORAL_TABLET | Freq: Every day | ORAL | 3 refills | Status: AC

## 2023-12-30 ENCOUNTER — Ambulatory Visit (HOSPITAL_COMMUNITY): Attending: Family Medicine

## 2023-12-30 ENCOUNTER — Other Ambulatory Visit: Payer: Self-pay

## 2023-12-30 DIAGNOSIS — M5441 Lumbago with sciatica, right side: Secondary | ICD-10-CM | POA: Diagnosis not present

## 2023-12-30 NOTE — Therapy (Incomplete)
 OUTPATIENT PHYSICAL THERAPY THORACOLUMBAR EVALUATION   Patient Name: Stephanie [name] MRN: [id number] DOB:15-Sep-1960, 63 y.o., female Today's Date: 12/31/2023  END OF SESSION:  PT End of Session - 12/30/23 1403     Visit Number 1    Number of Visits 12    Date for PT Re-Evaluation 02/10/24    Authorization Type [employer] H[employer]    PT Start Time 1350    PT Stop Time 1432    PT Time Calculation (min) 42 min    Activity Tolerance Patient tolerated treatment well    Behavior During Therapy [facility] for tasks assessed/performed          Past Medical History:  Diagnosis Date   Allergic rhinitis    Allergy     Arthritis    Asthma    Depression    DVT (deep venous thrombosis) (HCC)    August 2023   GERD (gastroesophageal reflux disease)    Heart murmur    Hypertension    Pneumonia    hx of x 3    Past Surgical History:  Procedure Laterality Date   CESAREAN SECTION  1985, 1989, 1995   JOINT REPLACEMENT  2018,2022   TONSILLECTOMY     TOTAL HIP ARTHROPLASTY Right 03/27/2017   Procedure: RIGHT TOTAL HIP ARTHROPLASTY ANTERIOR APPROACH;  Surgeon: Melodi Lerner, MD;  Location: [facility];  Service: Orthopedics;  Laterality: Right;   TOTAL HIP ARTHROPLASTY Left 08/17/2020   Procedure: TOTAL HIP ARTHROPLASTY ANTERIOR APPROACH;  Surgeon: Melodi Lerner, MD;  Location: [facility];  Service: Orthopedics;  Laterality: Left;    Patient Active Problem List   Diagnosis Date Noted   Herpes zoster without complication 10/24/2023   Chronic anticoagulation 12/21/2022   Severe persistent asthma, uncomplicated 08/23/2022   Single subsegmental pulmonary embolism without acute cor pulmonale (HCC) 08/23/2022   Positive D dimer 08/01/2022   Anxiety about health 08/01/2022   COVID-19 04/25/2022   Dvt femoral (deep venous thrombosis) (HCC) 01/19/2022   Anticoagulation management encounter 01/19/2022   History of revision of total replacement of left hip joint 12/13/2020   Varicose veins of left  lower extremity 10/14/2020   Primary osteoarthritis of left hip 08/17/2020   Screening for colon cancer 03/07/2020   Post-menopausal bleeding 03/07/2020   Need for vaccination 03/07/2020   OA (osteoarthritis) of hip 03/27/2017   History of contact dermatitis 02/11/2017   Osteoarthritis, hip, bilateral 07/03/2015   Moderate persistent asthma 04/27/2015   Allergic rhinoconjunctivitis 04/27/2015   Right-sided low back pain with right-sided sciatica 02/22/2015   Essential hypertension 10/17/2012    PCP: Alphonsa Glendia LABOR, MD  REFERRING PROVIDER: Alphonsa Glendia MD  REFERRING DIAG: M54.41 (ICD-10-CM) - Acute right-sided low back pain with right-sided sciatica   Rationale for Evaluation and Treatment: Rehabilitation  THERAPY DIAG:  Acute right-sided low back pain with right-sided sciatica  ONSET DATE: exacerbated since 2020  SUBJECTIVE:  SUBJECTIVE STATEMENT: Has been having issues with the back that started a long time ago and have been through different things to address it but every since having COVID 3 times it never went back the same. And recently it has been very bad with it radiating down the back of the right leg since her shingles as well.  MD note 12/13/23 - Presents for a recheck on her back pain, see previous note. States her pain is much improved down to a 2/10 from last visit. Has an appointment later in August for PT. That was the first available appointment. Has been doing exercises given at last visit with heat applications. Began having pain in the right low back on 6/10 and on 6/12 broke out with shingles along upper and mid thigh right side. Rash is gone. Still having some slight itching. Back pain starts in the lumbar area and radiates into the anterior right thigh along the same area where  she had shingles. Will occasionally radiate into the lower leg into the foot. Still struggling with movement but much better. Has also been applying red oil recommended by her sister (may be a type of topical pepper). Stopped Gabapentin  and Pregabalin  due to side effects.   PERTINENT HISTORY:  HTN (takes it routinely - usually runs around 122/94), hyperlipidemia; hx B THA and c-section  PAIN:  Are you having pain? Yes: NPRS scale: 3-4 right now; 8/10 worst (when she was trying to cook) Pain location: middle Pain description: achy and will stab when standing for a long time; being active with clothes/sweeping stabbing Aggravating factors: sweeping, mopping, standing, walking Relieving factors: laying down on her back and rotates to side  PRECAUTIONS: Fall  RED FLAGS: recent chest pain and occasional swelling R LE (seeing cardiologist now)   WEIGHT BEARING RESTRICTIONS: No  FALLS:  Has patient fallen in last 6 months? No  LIVING ENVIRONMENT: Lives with: lives with their family and grandson, and daughter Lives in: House/apartment Stairs: No Has following equipment at home: Single point cane  OCCUPATION: retired Psychiatric nurse   PLOF: Independent with basic ADLs and Independent with gait; attends church  PATIENT GOALS: Be able to bend and do more for herself because she has to have help with getting into bed and getting dressed.   NEXT MD VISIT: TBA   OBJECTIVE:  Note: Objective measures were completed at Evaluation unless otherwise noted.  DIAGNOSTIC FINDINGS:  EXAM: LUMBAR SPINE - COMPLETE 4+ VIEW IMPRESSION: 1. No acute fracture or traumatic listhesis of the lumbar spine. 2. Multilevel degenerative changes and facet arthropathy of the mid to lower lumbar spine with similar mild grade 1 anterolisthesis at L3-L4 and L4-L5.  PATIENT SURVEYS:  Modified Oswestry:  MODIFIED OSWESTRY DISABILITY SCALE  Date: 8/18 Score  Total 30/50 - 60%   Interpretation of  scores: Score Category Description  0-20% Minimal Disability The patient can cope with most living activities. Usually no treatment is indicated apart from advice on lifting, sitting and exercise  21-40% Moderate Disability The patient experiences more pain and difficulty with sitting, lifting and standing. Travel and social life are more difficult and they may be disabled from work. Personal care, sexual activity and sleeping are not grossly affected, and the patient can usually be managed by conservative means  41-60% Severe Disability Pain remains the main problem in this group, but activities of daily living are affected. These patients require a detailed investigation  61-80% Crippled Back pain impinges on all aspects of the patient's life.  Positive intervention is required  81-100% Bed-bound  These patients are either bed-bound or exaggerating their symptoms  Bluford FORBES Zoe DELENA Karon DELENA, et al. Surgery versus conservative management of stable thoracolumbar fracture: the PRESTO feasibility RCT. [place] ([place]): [employer]; 2021 Nov. [facility] Technology Assessment, No. 25.62.) Appendix 3, Oswestry Disability Index category descriptors. Available from: FindJewel[employer].cz  Minimally Clinically Important Difference (MCID) = 12.8%  COGNITION: Overall cognitive status: Within functional limits for tasks assessed     SENSATION: Not tested  MUSCLE LENGTH: Hamstrings: Right > tightness than L   POSTURE: increased lumbar lordosis and anterior pelvic tilt  PALPATION: R lateral thoracolumbar paraspinals increased discomfort compared to L side  LUMBAR ROM:   AROM eval  Flexion W/in 40% available normative ROM  Extension <15% normative ROM  Right lateral flexion Increased pain down R side - < 30% ROM  Left lateral flexion Decreased R pain; 60% available ROM  Right rotation Decreased pain ; 60% ROM  Left rotation Increased pain in right side 30% ROM    (Blank rows = not tested)  LOWER EXTREMITY ROM:     Active  Right eval Left eval  Hip flexion    Hip extension    Hip abduction    Hip adduction    Hip internal rotation    Hip external rotation    Knee flexion    Knee extension    Ankle dorsiflexion    Ankle plantarflexion    Ankle inversion    Ankle eversion     (Blank rows = not tested)  LOWER EXTREMITY MMT:    MMT Right eval Left eval  Hip flexion 3+/5 4-/5  Hip extension    Hip abduction    Hip adduction    Hip internal rotation    Hip external rotation    Knee flexion 4-/5 4/5  Knee extension 4/5 4/5  Ankle dorsiflexion 4-/5 4-/5  Ankle plantarflexion 4-/5 4-/5  Ankle inversion    Ankle eversion     (Blank rows = not tested)  LUMBAR SPECIAL TESTS:  Straight leg raise test: R sided pain down back of leg; negative for pain in L LE and Quadrant test: relief with flexion, R rotation and L lateral flexion   FUNCTIONAL TESTS:  5 times sit to stand: >30s secondary to unable to complete due to pain/requiring UE assist 30 seconds chair stand test   GAIT: Distance walked: 71' w Characteristics/pattern: wall cruising intermittently secondary to lack of stability/balance; increased time needed Level of assistance: SBA Comments: decreased tolerance post session and decreased WB on R LE at end of session; trendelenburg noted  TREATMENT DATE:  12/30/23 Eval - Mod complexity secondary to multiple body systems, unpredictable characteristics and multiple co morbidities HEP prescription and education on motion is lotion with continuing to perform functional transfers and gait with progressive loading as tolerated and for response management post exercise performance  PATIENT EDUCATION:  Education details: HEP, education on loading response/motion importance Person educated: Patient Education method:  Explanation, Demonstration, and Handouts Education comprehension: verbalized understanding and returned demonstration  HOME EXERCISE PROGRAM: Access Code: [email or web address] URL: [email or web address] Date: 12/30/2023 Prepared by: Lamarr Citrin  Exercises - Supine Lower Trunk Rotation  - 1 x daily - 7 x weekly - 2 sets - 10 reps - 4s hold - Hooklying Single Knee to Chest Stretch  - 1 x daily - 7 x weekly - 1 sets - 6 reps - 10s hold - Supine Pelvic Tilt  - 1 x daily - 7 x weekly - 2 sets - 10 reps - 5s hold - Seated Hamstring Stretch  - 1 x daily - 7 x weekly - 2 sets - 4 reps - 20s hold - Seated Forward Bending  - 1 x daily - 7 x weekly - 2 sets - 15 reps - 2s hold - Sit to Stand with Hands on Knees  - 1 x daily - 7 x weekly - 3 sets - 10 reps  ASSESSMENT:  CLINICAL IMPRESSION: Patient is a 63 y.o. female who was seen today for physical therapy evaluation and treatment for acute R sided low back pain. She presents with increased R sided radiating pain with lateral flexion (R), L rotation, and end range flexion/extension. Relief with gentle forward flexion and L lateral flexion/R rotation in standing. Decreased leg strength as indicated by need for UE to stand from sitting and decreased core endurance as pelvic obliquity during SLR testing as well as decreased control of lumbopelvic tilt during functional transfers likely impacting pain during mobility with gait and transfers and standing at this time. Demonstrates some pain with movements that testing requires. Patient is unable to lie on her back today and has trouble with balance and gait during transitional movements as well requiring SBA for safety. Patient will benefit from skilled PT interventions to address low back pain with radiating symptoms, decreased lumbar mobility and core weakness, LE weakness, transfer and gait training and the instruction, development and modification of HEP.   OBJECTIVE IMPAIRMENTS: Abnormal gait,  decreased activity tolerance, decreased balance, decreased coordination, decreased endurance, difficulty walking, decreased ROM, decreased strength, and pain.   ACTIVITY LIMITATIONS: carrying, sitting, standing, transfers, and bed mobility  PARTICIPATION LIMITATIONS: meal prep, laundry, driving, shopping, and community activity  PERSONAL FACTORS: 1-2 comorbidities: HTN/cardiac compromis and B THA hx are also affecting patient's functional outcome.   REHAB POTENTIAL: Fair financial limitations  CLINICAL DECISION MAKING: Evolving/moderate complexity  EVALUATION COMPLEXITY: Moderate   GOALS: Goals reviewed with patient? No  SHORT TERM GOALS: Target date: 01/27/24   Patient with be independent with initial HEP to  improve functional outcomes. Baseline: hx HEP for THA - no exercises for LBP Goal status: INITIAL  Patient will improve lumbar extension to 50% available to improve posturing with ambulation and standing. Baseline: 30% Goal status: INITIAL  Pt will be able to perform 2x sit to stand without UE support and with SBA for 5 repetitions to indicate improved functional LE strength.  Baseline: unable to stand without UE Goal status: INITIAL  LONG TERM GOALS: Target date: 03/02/24  Pt will improve to 100' with LRD and no increase in pain to indicate improved functional gait tolerance for improving activity.  Baseline: TBA Goal status: INITIAL  2.  Pt will demonstrate tolerance to FTSTS testing in 12.7 s to indicate normative age appropriate range for functional strength.  Baseline: TBA Goal status: INITIAL  3.  Pt will report improved functional quality of life and less disability as indicated by score on modified ODI to 20% or less.  Baseline: 60% Goal status: INITIAL  4.  Pt will report independence with HEP and self management of pain in low back as indicated by decreased functional need for A with cuing during review of HEP.  Baseline: HEP for THA only  Goal  status: INITIAL   PLAN:  PT FREQUENCY: ideally 1-2x/wk however financial limitations prevent more than that so discussed plan for once every other week  PT DURATION: 10 weeks  PLANNED INTERVENTIONS: 97110-Therapeutic exercises, 97530- Therapeutic activity, 97112- Neuromuscular re-education, 97535- Self Care, [id number]- Manual therapy, [phone]- Gait training, [phone]- Electrical stimulation (unattended), [phone]- Electrical stimulation (manual), Patient/Family education, Balance training, and Stair training.  PLAN FOR NEXT SESSION: HEP review, 5x sit to stand, gait and balance training; review pain response to HEP and progression with mobility as tolerated (only seeing once every other week secondary to financial limitations)   Lamarr LITTIE Citrin PT, DPT [facility] [place] [phone] office  Lamarr LITTIE Citrin, PT 12/31/2023, 8:24 AM   Mylene Barrows Request Treatment Start Date: 12/31/23  Referring diagnosis code (ICD 10)? M54.41 (ICD-10-CM) - Acute right-sided low back pain with right-sided sciatica Treatment diagnosis codes (ICD 10)? (if different than referring diagnosis) M54.41 (ICD-10-CM) - Acute right-sided low back pain with right-sided sciatica What was this (referring dx) caused by? []  Surgery []  Fall [x]  Ongoing issue [x]  Arthritis []  Other: ____________  Laterality: []  Rt []  Lt [x]  Both  Deficits: [x]  Pain [x]  Stiffness []  Weakness []  Edema [x]  Balance Deficits [x]  Coordination [x]  Gait Disturbance [x]  ROM []  Other   Functional Tool Score: 5 times sit to stand: >30s secondary to unable to complete due to pain/requiring UE assist  CPT codes: See Planned Interventions listed in the Plan section of the Evaluation.

## 2023-12-31 NOTE — Addendum Note (Signed)
 Addended by: Antionette Luster L on: 12/31/2023 08:37 AM   Modules accepted: Orders

## 2024-01-15 ENCOUNTER — Ambulatory Visit (HOSPITAL_COMMUNITY): Attending: Family Medicine | Admitting: Physical Therapy

## 2024-01-15 DIAGNOSIS — M5441 Lumbago with sciatica, right side: Secondary | ICD-10-CM | POA: Insufficient documentation

## 2024-01-15 NOTE — Therapy (Signed)
 OUTPATIENT PHYSICAL THERAPY THORACOLUMBAR TREATMENT   Patient Name: Stephanie [name] MRN: [id number] DOB:Apr 14, 1961, 63 y.o., female Today's Date: 01/15/2024  END OF SESSION:  PT End of Session - 01/15/24 0902     Visit Number 2    Number of Visits 12    Date for PT Re-Evaluation 02/10/24    Authorization Type [employer] HMO    PT Start Time 0805    PT Stop Time 0850    PT Time Calculation (min) 45 min    Activity Tolerance Patient tolerated treatment well    Behavior During Therapy [facility] for tasks assessed/performed           Past Medical History:  Diagnosis Date   Allergic rhinitis    Allergy     Arthritis    Asthma    Depression    DVT (deep venous thrombosis) (HCC)    August 2023   GERD (gastroesophageal reflux disease)    Heart murmur    Hypertension    Pneumonia    hx of x 3    Past Surgical History:  Procedure Laterality Date   CESAREAN SECTION  1985, 1989, 1995   JOINT REPLACEMENT  2018,2022   TONSILLECTOMY     TOTAL HIP ARTHROPLASTY Right 03/27/2017   Procedure: RIGHT TOTAL HIP ARTHROPLASTY ANTERIOR APPROACH;  Surgeon: Melodi Lerner, MD;  Location: [facility];  Service: Orthopedics;  Laterality: Right;   TOTAL HIP ARTHROPLASTY Left 08/17/2020   Procedure: TOTAL HIP ARTHROPLASTY ANTERIOR APPROACH;  Surgeon: Melodi Lerner, MD;  Location: [facility];  Service: Orthopedics;  Laterality: Left;    Patient Active Problem List   Diagnosis Date Noted   Herpes zoster without complication 10/24/2023   Chronic anticoagulation 12/21/2022   Severe persistent asthma, uncomplicated 08/23/2022   Single subsegmental pulmonary embolism without acute cor pulmonale (HCC) 08/23/2022   Positive D dimer 08/01/2022   Anxiety about health 08/01/2022   COVID-19 04/25/2022   Dvt femoral (deep venous thrombosis) (HCC) 01/19/2022   Anticoagulation management encounter 01/19/2022   History of revision of total replacement of left hip joint 12/13/2020   Varicose veins of left  lower extremity 10/14/2020   Primary osteoarthritis of left hip 08/17/2020   Screening for colon cancer 03/07/2020   Post-menopausal bleeding 03/07/2020   Need for vaccination 03/07/2020   OA (osteoarthritis) of hip 03/27/2017   History of contact dermatitis 02/11/2017   Osteoarthritis, hip, bilateral 07/03/2015   Moderate persistent asthma 04/27/2015   Allergic rhinoconjunctivitis 04/27/2015   Right-sided low back pain with right-sided sciatica 02/22/2015   Essential hypertension 10/17/2012    PCP: Alphonsa Glendia LABOR, MD  REFERRING PROVIDER: Alphonsa Glendia MD  REFERRING DIAG: M54.41 (ICD-10-CM) - Acute right-sided low back pain with right-sided sciatica   Rationale for Evaluation and Treatment: Rehabilitation  THERAPY DIAG:  Acute right-sided low back pain with right-sided sciatica  ONSET DATE: exacerbated since 2020  SUBJECTIVE:  SUBJECTIVE STATEMENT: Pain comes and goes.  Had a rough weekend with pain but currently without any.  Reports compliance with HEP and seems to be helping.   Evaluation: Has been having issues with the back that started a long time ago and have been through different things to address it but every since having COVID 3 times it never went back the same. And recently it has been very bad with it radiating down the back of the right leg since her shingles as well.  MD note 12/13/23 - Presents for a recheck on her back pain, see previous note. States her pain is much improved down to a 2/10 from last visit. Has an appointment later in August for PT. That was the first available appointment. Has been doing exercises given at last visit with heat applications. Began having pain in the right low back on 6/10 and on 6/12 broke out with shingles along upper and mid thigh right side.  Rash is gone. Still having some slight itching. Back pain starts in the lumbar area and radiates into the anterior right thigh along the same area where she had shingles. Will occasionally radiate into the lower leg into the foot. Still struggling with movement but much better. Has also been applying red oil recommended by her sister (may be a type of topical pepper). Stopped Gabapentin  and Pregabalin  due to side effects.   PERTINENT HISTORY:  HTN (takes it routinely - usually runs around 122/94), hyperlipidemia; hx B THA and c-section  PAIN:  Are you having pain? Yes: NPRS scale: 3-4 right now; 8/10 worst (when she was trying to cook) Pain location: middle Pain description: achy and will stab when standing for a long time; being active with clothes/sweeping stabbing Aggravating factors: sweeping, mopping, standing, walking Relieving factors: laying down on her back and rotates to side  PRECAUTIONS: Fall  RED FLAGS: recent chest pain and occasional swelling R LE (seeing cardiologist now)   WEIGHT BEARING RESTRICTIONS: No  FALLS:  Has patient fallen in last 6 months? No  LIVING ENVIRONMENT: Lives with: lives with their family and grandson, and daughter Lives in: House/apartment Stairs: No Has following equipment at home: Single point cane  OCCUPATION: retired Psychiatric nurse   PLOF: Independent with basic ADLs and Independent with gait; attends church  PATIENT GOALS: Be able to bend and do more for herself because she has to have help with getting into bed and getting dressed.   NEXT MD VISIT: TBA   OBJECTIVE:  Note: Objective measures were completed at Evaluation unless otherwise noted.  DIAGNOSTIC FINDINGS:  EXAM: LUMBAR SPINE - COMPLETE 4+ VIEW IMPRESSION: 1. No acute fracture or traumatic listhesis of the lumbar spine. 2. Multilevel degenerative changes and facet arthropathy of the mid to lower lumbar spine with similar mild grade 1 anterolisthesis at L3-L4 and  L4-L5.  PATIENT SURVEYS:  Modified Oswestry:  MODIFIED OSWESTRY DISABILITY SCALE  Date: 8/18 Score  Total 30/50 - 60%   Interpretation of scores: Score Category Description  0-20% Minimal Disability The patient can cope with most living activities. Usually no treatment is indicated apart from advice on lifting, sitting and exercise  21-40% Moderate Disability The patient experiences more pain and difficulty with sitting, lifting and standing. Travel and social life are more difficult and they may be disabled from work. Personal care, sexual activity and sleeping are not grossly affected, and the patient can usually be managed by conservative means  41-60% Severe Disability Pain remains the main problem in  this group, but activities of daily living are affected. These patients require a detailed investigation  61-80% Crippled Back pain impinges on all aspects of the patient's life. Positive intervention is required  81-100% Bed-bound  These patients are either bed-bound or exaggerating their symptoms  Bluford FORBES Zoe DELENA Karon DELENA, et al. Surgery versus conservative management of stable thoracolumbar fracture: the PRESTO feasibility RCT. [place]): [employer]; 2021 Nov. [facility] Technology Assessment, No. 25.62.) Appendix 3, Oswestry Disability Index category descriptors. Available from: FindJewelers.cz  Minimally Clinically Important Difference (MCID) = 12.8%  COGNITION: Overall cognitive status: Within functional limits for tasks assessed     SENSATION: Not tested  MUSCLE LENGTH: Hamstrings: Right > tightness than L   POSTURE: increased lumbar lordosis and anterior pelvic tilt  PALPATION: R lateral thoracolumbar paraspinals increased discomfort compared to L side  LUMBAR ROM:   AROM eval  Flexion W/in 40% available normative ROM  Extension <15% normative ROM  Right lateral flexion Increased pain down R side - < 30% ROM  Left  lateral flexion Decreased R pain; 60% available ROM  Right rotation Decreased pain ; 60% ROM  Left rotation Increased pain in right side 30% ROM   (Blank rows = not tested)  LOWER EXTREMITY ROM:     Active  Right eval Left eval  Hip flexion    Hip extension    Hip abduction    Hip adduction    Hip internal rotation    Hip external rotation    Knee flexion    Knee extension    Ankle dorsiflexion    Ankle plantarflexion    Ankle inversion    Ankle eversion     (Blank rows = not tested)  LOWER EXTREMITY MMT:    MMT Right eval Left eval  Hip flexion 3+/5 4-/5  Hip extension    Hip abduction    Hip adduction    Hip internal rotation    Hip external rotation    Knee flexion 4-/5 4/5  Knee extension 4/5 4/5  Ankle dorsiflexion 4-/5 4-/5  Ankle plantarflexion 4-/5 4-/5  Ankle inversion    Ankle eversion     (Blank rows = not tested)  LUMBAR SPECIAL TESTS:  Straight leg raise test: R sided pain down back of leg; negative for pain in L LE and Quadrant test: relief with flexion, R rotation and L lateral flexion   FUNCTIONAL TESTS:  5 times sit to stand: 01/15/24:  standard chair no UE, 5X in 2:26 30 seconds chair stand test 01/15/24:  standard chair no UE, 1X  GAIT: Distance walked: 31' w Characteristics/pattern: wall cruising intermittently secondary to lack of stability/balance; increased time needed Level of assistance: SBA Comments: decreased tolerance post session and decreased WB on R LE at end of session; trendelenburg noted  TREATMENT DATE:  01/15/24:  Goal review 5 times sit to stand: 01/15/24:  standard chair no UE, 5X in 2:26 30 seconds chair stand test 01/15/24:  standard chair no UE, 1X Sit to stands hands on thighs, cues 5X Standing:  lumbar extension 10X Supine:  lower trunk rotation 5X10   Single knee to chest 5X 10 with towel  PPT 10X5  Bridge 10X  SLR 10X each   12/30/23 Eval - Mod complexity secondary to multiple body systems, unpredictable  characteristics and multiple co morbidities HEP prescription and education on motion is lotion with continuing to perform functional transfers and gait with progressive loading as tolerated and for response management post exercise  performance                                                                                                                       PATIENT EDUCATION:  Education details: HEP, education on loading response/motion importance Person educated: Patient Education method: Explanation, Demonstration, and Handouts Education comprehension: verbalized understanding and returned demonstration  HOME EXERCISE PROGRAM: Access Code: [email or web address] URL: [email or web address] Date: 12/30/2023 Prepared by: Lamarr Citrin  Exercises - Supine Lower Trunk Rotation  - 1 x daily - 7 x weekly - 2 sets - 10 reps - 4s hold - Hooklying Single Knee to Chest Stretch  - 1 x daily - 7 x weekly - 1 sets - 6 reps - 10s hold - Supine Pelvic Tilt  - 1 x daily - 7 x weekly - 2 sets - 10 reps - 5s hold - Seated Hamstring Stretch  - 1 x daily - 7 x weekly - 2 sets - 4 reps - 20s hold - Seated Forward Bending  - 1 x daily - 7 x weekly - 2 sets - 15 reps - 2s hold - Sit to Stand with Hands on Knees  - 1 x daily - 7 x weekly - 3 sets - 10 reps  ASSESSMENT:  CLINICAL IMPRESSION: Goals reviewed and POC moving forward.  PT unable to complete sit to stands without 80% UE assist initially but able to instruct to recruit glut/quadriceps and complete without UE assist.  30 second and 5X STS were then tested but with increased time to complete due to weakness/recovery time.  Pt very pleased with self as she was able to complete this task.  Reviewed HEP with pt having questions on hold and form.  Pt reported forward flexion irritated symptoms but extension improved.  Instructed to discontinue forward bending activity and complete extension.  Pt without c/o pain during or at completion of session.  No new  exercises were added to HEP today.  Patient will continue to benefit from skilled PT interventions to address low back pain with radiating symptoms, decreased lumbar mobility and core weakness, LE weakness, transfer and gait training and the instruction, development and modification of HEP.   OBJECTIVE IMPAIRMENTS: Abnormal gait, decreased activity tolerance, decreased balance, decreased coordination, decreased endurance, difficulty walking, decreased ROM, decreased strength, and pain.   ACTIVITY LIMITATIONS: carrying, sitting, standing, transfers, and bed mobility  PARTICIPATION LIMITATIONS: meal prep, laundry, driving, shopping, and community activity  PERSONAL FACTORS: 1-2 comorbidities: HTN/cardiac compromis and B THA hx are also affecting patient's functional outcome.   REHAB POTENTIAL: Fair financial limitations  CLINICAL DECISION MAKING: Evolving/moderate complexity  EVALUATION COMPLEXITY: Moderate   GOALS: Goals reviewed with patient? No  SHORT TERM GOALS: Target date: 01/27/24   Patient with be independent with initial HEP to  improve functional outcomes. Baseline: hx HEP for THA - no exercises for LBP Goal status: INITIAL  Patient will improve lumbar extension to 50% available to improve posturing with ambulation and standing. Baseline: 30% Goal status:  INITIAL  Pt will be able to perform 2x sit to stand without UE support and with SBA for 5 repetitions to indicate improved functional LE strength.  Baseline: unable to stand without UE Goal status: INITIAL  LONG TERM GOALS: Target date: 03/02/24  Pt will improve to 100' with LRD and no increase in pain to indicate improved functional gait tolerance for improving activity.  Baseline: TBA Goal status: INITIAL  2.  Pt will demonstrate tolerance to FTSTS testing in 12.7 s to indicate normative age appropriate range for functional strength.  Baseline: TBA Goal status: INITIAL  3.  Pt will report improved functional  quality of life and less disability as indicated by score on modified ODI to 20% or less.  Baseline: 60% Goal status: INITIAL  4.  Pt will report independence with HEP and self management of pain in low back as indicated by decreased functional need for A with cuing during review of HEP.  Baseline: HEP for THA only  Goal status: INITIAL   PLAN:  PT FREQUENCY: ideally 1-2x/wk however financial limitations prevent more than that so discussed plan for once every other week  PT DURATION: 10 weeks  PLANNED INTERVENTIONS: 97110-Therapeutic exercises, 97530- Therapeutic activity, 97112- Neuromuscular re-education, 97535- Self Care, 02859- Manual therapy, [phone]- Gait training, [phone]- Electrical stimulation (unattended), [phone]- Electrical stimulation (manual), Patient/Family education, Balance training, and Stair training.  PLAN FOR NEXT SESSION: continue to progress HEP, encourage flexibility and walking program. Increase core and LE strength. (only seeing once every other week secondary to financial limitations)   Greig KATHEE Fuse, PTA/CLT [facility] Ph: [phone]   Fuse Greig KATHEE, PTA 01/15/2024, 9:03 AM

## 2024-01-27 ENCOUNTER — Encounter (HOSPITAL_COMMUNITY): Payer: Self-pay

## 2024-01-27 DIAGNOSIS — M5441 Lumbago with sciatica, right side: Secondary | ICD-10-CM

## 2024-01-27 NOTE — Therapy (Signed)
 [facility] Outpatient Rehabilitation at [facility] [address] [place], [place], [address] Phone: [phone]   Fax:  [phone]  Patient Details  Name: Stephanie [name] MRN: [id number] Date of Birth: 1960-11-09 Referring Provider:  No ref. provider found  Encounter Date: 01/27/2024  PHYSICAL THERAPY DISCHARGE SUMMARY  Visits from Start of Care: 2  Current functional level related to goals / functional outcomes: Continued LBP per most recent treatment note; compliance with HEP per note   Remaining deficits: Continued pain, deficits in understanding with mm recruitment/form   Education / Equipment: Compliance w/ HEP per last noted   Patient agrees to discharge. Patient goals were not met. Patient is being discharged due to financial reasons. Pt called to cancel all upcoming appt secondary to financial implications.   Lamarr LITTIE Citrin, PT 01/27/2024, 12:08 PM  [facility] Outpatient Rehabilitation at [facility] [address] [place], [place], [address] Phone: [phone]   Fax:  [phone]

## 2024-01-29 ENCOUNTER — Encounter (HOSPITAL_COMMUNITY)

## 2024-01-31 ENCOUNTER — Encounter (HOSPITAL_COMMUNITY)

## 2024-02-11 DIAGNOSIS — H35032 Hypertensive retinopathy, left eye: Secondary | ICD-10-CM | POA: Diagnosis not present

## 2024-02-11 DIAGNOSIS — H524 Presbyopia: Secondary | ICD-10-CM | POA: Diagnosis not present

## 2024-02-12 ENCOUNTER — Encounter (HOSPITAL_COMMUNITY)

## 2024-02-14 ENCOUNTER — Encounter (HOSPITAL_COMMUNITY)

## 2024-02-14 ENCOUNTER — Ambulatory Visit: Admitting: Family Medicine

## 2024-02-18 ENCOUNTER — Encounter (HOSPITAL_COMMUNITY)

## 2024-02-18 ENCOUNTER — Emergency Department (HOSPITAL_COMMUNITY)
Admission: EM | Admit: 2024-02-18 | Discharge: 2024-02-18 | Disposition: A | Discharge status: Home / Self Care | Attending: Emergency Medicine | Admitting: Emergency Medicine

## 2024-02-18 ENCOUNTER — Other Ambulatory Visit: Payer: Self-pay

## 2024-02-18 ENCOUNTER — Encounter (HOSPITAL_COMMUNITY): Payer: Self-pay

## 2024-02-18 ENCOUNTER — Emergency Department (EMERGENCY_DEPARTMENT_HOSPITAL)

## 2024-02-18 DIAGNOSIS — M79661 Pain in right lower leg: Secondary | ICD-10-CM

## 2024-02-18 DIAGNOSIS — M79604 Pain in right leg: Secondary | ICD-10-CM | POA: Diagnosis not present

## 2024-02-18 DIAGNOSIS — Z91040 Latex allergy status: Secondary | ICD-10-CM | POA: Diagnosis not present

## 2024-02-18 DIAGNOSIS — Z79899 Other long term (current) drug therapy: Secondary | ICD-10-CM | POA: Insufficient documentation

## 2024-02-18 NOTE — Discharge Instructions (Addendum)
 The ultrasound did not show any signs of blood clot.  You can take over-the-counter medications as needed.

## 2024-02-18 NOTE — ED Provider Notes (Signed)
 Patient was initially seen by Dr. Garrick.  Please see his note.  Patient presents to the ED for evaluation of leg pain concerning for possible DVT.  Doppler study is negative for DVT.   Randol Simmonds, MD 02/18/24 [phone]

## 2024-02-18 NOTE — Progress Notes (Signed)
 Right lower extremity venous duplex has been completed.  Results can be found in chart review under CV Proc.  02/18/2024 7:01 PM  Manar Smalling [place], RVT.

## 2024-02-18 NOTE — ED Provider Notes (Signed)
 [place] EMERGENCY DEPARTMENT AT [facility] Provider Note   CSN: [id number] Arrival date & time: 02/18/24  1346     Patient presents with: Leg Pain   Stephanie [name] is a 63 y.o. female.   HPI Patient presents with leg pain. Patient has a history of DVT in the right leg 2 years ago following a fall, subsequent vehicle travel.  She is not currently anticoagulated. 2 days ago patient began having pain, though she initially states that his right leg, on additional questioning, it is clear the patient also has pain in the right buttocks, radiating down the right leg.  No distal loss of sensation no systemic complaints, no chest pain, no dyspnea.  She is here with a friend who assists.    Prior to Admission medications   Medication Sig Start Date End Date Taking? Authorizing Provider  albuterol  (PROVENTIL ) (2.5 MG/3ML) 0.083% nebulizer solution Take 3 mLs (2.5 mg total) by nebulization every 4 (four) hours as needed for wheezing or shortness of breath. 12/10/23   Kozlow, Camellia JINNY, MD  albuterol  (VENTOLIN  HFA) 108 (90 Base) MCG/ACT inhaler Inhale 2 puffs into the lungs every 4 (four) hours as needed for wheezing or shortness of breath. 12/10/23   Kozlow, Camellia JINNY, MD  amLODipine  (NORVASC ) 5 MG tablet Take 1 tablet (5 mg total) by mouth daily. 09/16/23   Alphonsa Glendia LABOR, MD  Ascorbic Acid (VITAMIN C) 1000 MG tablet Take 1,000 mg by mouth daily.    [provider]  budesonide -formoterol  (SYMBICORT ) 160-4.5 MCG/ACT inhaler Inhale 2 puffs into the lungs 2 (two) times daily. 12/10/23   Kozlow, Camellia JINNY, MD  cetirizine  (ZYRTEC ) 10 MG tablet Take 1 tablet (10 mg total) by mouth daily as needed (Can take an extra dose during flare ups.). 07/09/23   Kozlow, Eric J, MD  Cholecalciferol (VITAMIN D3) 50 MCG (2000 UT) TABS Take by mouth daily at 6 (six) AM.    [provider]  EPINEPHrine  (EPIPEN  2-PAK) 0.3 mg/0.3 mL IJ SOAJ injection Inject 0.3 mg into the muscle once as needed for  anaphylaxis (allergic reaction). 12/10/23   Kozlow, Camellia JINNY, MD  famotidine  (PEPCID ) 40 MG tablet Take 1 tablet (40 mg total) by mouth at bedtime. 12/10/23   Kozlow, Camellia JINNY, MD  Fluticasone  Furoate (ARNUITY ELLIPTA ) 100 MCG/ACT AEPB 1 inhalation daily 06/26/23   Alphonsa Glendia LABOR, MD  GARLIC PO Take 1 tablet by mouth daily.    [provider]  methocarbamol  (ROBAXIN ) 500 MG tablet Take 1 tablet (500 mg total) by mouth 3 (three) times daily as needed for muscle spasms. 10/21/23   Harris, Abigail, PA-C  metoprolol  succinate (TOPROL -XL) 100 MG 24 hr tablet TAKE 2 HOURS PRIOR TO CT SCAN 12/16/23   O'Neal, Darryle Ned, MD  metoprolol  tartrate (LOPRESSOR ) 100 MG tablet Take tablet (100mg ) TWO hours prior to your cardiac CT scan. 12/25/23   O'NealDarryle Ned, MD  mometasone  (ELOCON ) 0.1 % ointment Apply topically daily. Apply to rash 07/09/23   Maurilio Camellia JINNY, MD  Olopatadine-Mometasone  (RYALTRIS ) [phone] MCG/ACT SUSP Place 2 sprays into both nostrils 2 (two) times daily. 07/09/23   Kozlow, Camellia JINNY, MD  omeprazole  (PRILOSEC) 40 MG capsule Take 1 capsule (40 mg total) by mouth in the morning. 12/10/23   Kozlow, Camellia JINNY, MD  Respiratory Therapy Supplies (NEBULIZER MASK ADULT) MISC 1 kit by Does not apply route as directed. 12/10/23   Kozlow, Camellia JINNY, MD  rosuvastatin  (CRESTOR ) 5 MG tablet Take 1  tablet (5 mg total) by mouth daily. 12/27/23 03/26/24  O'NealDarryle Ned, MD  Spacer/Aero-Holding Chambers DEVI 1 Device by Does not apply route as directed. 12/10/23   Kozlow, Camellia PARAS, MD  TRUE METRIX BLOOD GLUCOSE TEST test strip SMARTSIG:Via Meter 05/26/21   [provider]  TRUEplus Lancets 33G MISC  05/26/21   [provider]  valACYclovir  (VALTREX ) 1000 MG tablet TAKE 1 TABLET BY MOUTH THREE TIMES DAILY FOR 7 DAYS 11/27/23   Grooms, Charmaine, PA-C  vitamin E 180 MG (400 UNITS) capsule Take 400 Units by mouth daily.    [provider]  Zinc 50 MG CAPS Take 1 capsule by mouth daily at 6 (six)  AM.    [provider]    Allergies: Amoxicillin , Aspirin, Other, Hydrocodone , Latex, Oxycodone , Levaquin  [levofloxacin ], and Pregabalin     Review of Systems  Updated Vital Signs BP (!) 147/128 (BP Location: Right Arm)   Pulse 80   Temp 98.7 F (37.1 C) (Oral)   Resp 16   LMP 09/17/2016 (Approximate)   SpO2 99%   Physical Exam Vitals and nursing note reviewed.  Constitutional:      General: She is not in acute distress.    Appearance: She is well-developed.  HENT:     Head: Normocephalic and atraumatic.  Eyes:     Conjunctiva/sclera: Conjunctivae normal.  Cardiovascular:     Rate and Rhythm: Normal rate and regular rhythm.  Pulmonary:     Effort: Pulmonary effort is normal. No respiratory distress.     Breath sounds: No stridor.  Abdominal:     General: There is no distension.  Musculoskeletal:     Comments: Patient with symmetric strength bilaterally no appreciable swelling in the right leg she flexes against resistance notes some pain rating down her lower back with flexion of the distal right lower leg.  Skin:    General: Skin is warm and dry.  Neurological:     Mental Status: She is alert and oriented to person, place, and time.     Cranial Nerves: No cranial nerve deficit.  Psychiatric:        Mood and Affect: Mood normal.     (all labs ordered are listed, but only abnormal results are displayed) Labs Reviewed - No data to display  EKG: None  Radiology: No results found.   Procedures   Medications Ordered in the ED - No data to display                                  Medical Decision Making Adult female with history of prior DVT presents with right leg pain.  Given her history, recurrent DVT is a consideration, the patient's description is more consistent with lumbosacral radiculopathy.  Ultrasound ordered. Patient's denial of chest pain, dyspnea reassuring for low suspicion of PE.  Amount and/or Complexity of Data Reviewed Independent  Historian: friend Radiology:  Decision-making details documented in ED Course.  Risk Prescription drug management. Decision regarding hospitalization.   4:07 PM On signout patient awaiting ultrasound, repeat exam.  Final diagnoses:  Right leg pain     Garrick [place], MD 02/18/24 1609

## 2024-02-18 NOTE — ED Triage Notes (Addendum)
 Pt reports with right leg pain and swelling since Sunday. Pt states that it feels the same as when she had a clot last time.

## 2024-02-18 NOTE — ED Notes (Signed)
 Pt states that she is leaving.

## 2024-02-18 NOTE — ED Provider Triage Note (Signed)
 Emergency Medicine Provider Triage Evaluation Note  Stephanie [name] , a 63 y.o. female  was evaluated in triage.  Pt complains of right leg pain.  Patient with history of DVT that was provoked.  This was about 2 years ago and was on blood thinners for about a year.  No improvement.  Denies any chest pain.  Review of Systems  Positive: Leg swelling Negative: No chest pain  Physical Exam  BP (!) 147/128 (BP Location: Right Arm)   Pulse 80   Temp 98.7 F (37.1 C) (Oral)   Resp 16   LMP 09/17/2016 (Approximate)   SpO2 99%  Gen:   Awake, no distress   Resp:  Normal effort  MSK:   Moves extremities without difficulty.  Other:    Medical Decision Making  Medically screening exam initiated at 2:16 PM.  Appropriate orders placed.  Stephanie [name] was informed that the remainder of the evaluation will be completed by another provider, this initial triage assessment does not replace that evaluation, and the importance of remaining in the ED until their evaluation is complete.     [name], MD 02/18/24 [phone]

## 2024-02-25 ENCOUNTER — Encounter (HOSPITAL_COMMUNITY)

## 2024-02-27 ENCOUNTER — Encounter (HOSPITAL_COMMUNITY)

## 2024-02-27 ENCOUNTER — Ambulatory Visit: Admitting: Family Medicine

## 2024-03-15 ENCOUNTER — Other Ambulatory Visit: Payer: Self-pay | Admitting: Family Medicine

## 2024-04-01 DIAGNOSIS — Z1231 Encounter for screening mammogram for malignant neoplasm of breast: Secondary | ICD-10-CM | POA: Diagnosis not present

## 2024-04-13 DIAGNOSIS — R928 Other abnormal and inconclusive findings on diagnostic imaging of breast: Secondary | ICD-10-CM | POA: Diagnosis not present

## 2024-04-29 ENCOUNTER — Ambulatory Visit: Admitting: Podiatry

## 2024-05-18 ENCOUNTER — Ambulatory Visit: Admitting: Podiatry

## 2024-05-18 ENCOUNTER — Encounter: Payer: Self-pay | Admitting: Podiatry

## 2024-05-18 VITALS — Ht 64.0 in | Wt 202.0 lb

## 2024-05-18 DIAGNOSIS — D2372 Other benign neoplasm of skin of left lower limb, including hip: Secondary | ICD-10-CM

## 2024-05-18 NOTE — Progress Notes (Signed)
" ° °  Chief Complaint  Patient presents with   Nail Problem    Patient is here for routine foot care for Pain due to onychomycosis of toenails of both feet      Subjective: 64 y.o. 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   Past Medical History:  Diagnosis Date   Allergic rhinitis    Allergy     Arthritis    Asthma    Depression    DVT (deep venous thrombosis) (HCC)    August 2023   GERD (gastroesophageal reflux disease)    Heart murmur    Hypertension    Pneumonia    hx of x 3     Past Surgical History:  Procedure Laterality Date   CESAREAN SECTION  1985, 1989, 1995   JOINT REPLACEMENT  2018,2022   TONSILLECTOMY     TOTAL HIP ARTHROPLASTY Right 03/27/2017   Procedure: RIGHT TOTAL HIP ARTHROPLASTY ANTERIOR APPROACH;  Surgeon: Melodi Lerner, MD;  Location: [facility];  Service: Orthopedics;  Laterality: Right;   TOTAL HIP ARTHROPLASTY Left 08/17/2020   Procedure: TOTAL HIP ARTHROPLASTY ANTERIOR APPROACH;  Surgeon: Melodi Lerner, MD;  Location: [facility];  Service: Orthopedics;  Laterality: Left;     Allergies[1]   Objective:  Physical Exam General: Alert and oriented x3 in no acute distress  Dermatology: Hyperkeratotic lesion(s) present on the bilateral feet. Pain on palpation with a central nucleated core noted. Skin is warm, dry and supple bilateral lower extremities. Negative for open lesions or macerations.  Vascular: Palpable pedal pulses bilaterally. No edema or erythema noted. Capillary refill within normal limits.  Neurological: Grossly intact via light touch  Musculoskeletal Exam: Pain on palpation at the keratotic lesion(s) noted. Range of motion within normal limits bilateral. Muscle strength 5/5 in all groups bilateral.  Assessment: 1.  Eccrine poroma bilateral; multiple   Plan of Care:  -Patient evaluated -Excisional debridement of keratoic lesion(s) using a chisel blade was performed without  incident.  -Salicylic acid applied with a bandaid -Recommend OTC salicylic acid daily PRN -Return to the clinic PRN.   Thresa EMERSON Sar, DPM [facility]  Dr. Thresa EMERSON Sar, DPM    [address] [place], [place] [address]                Office [phone]  Fax [phone]          [1]  Allergies Allergen Reactions   Amoxicillin  Rash    fash swelled up and itching 04/2018   Aspirin Anaphylaxis   Other Anaphylaxis    Nsaids   Hydrocodone  Itching        Latex Hives and Itching   Oxycodone      itching   Levaquin  [Levofloxacin ]     Rash on chest no hives 04/2018   Pregabalin      Fatigue and sleepiness   "

## 2024-06-08 ENCOUNTER — Ambulatory Visit: Admitting: Family Medicine

## 2024-06-13 ENCOUNTER — Other Ambulatory Visit: Payer: Self-pay | Admitting: Allergy and Immunology

## 2024-07-28 ENCOUNTER — Ambulatory Visit: Admitting: Allergy and Immunology

## 2024-10-23 ENCOUNTER — Ambulatory Visit
